# Patient Record
Sex: Female | Born: 1955 | Race: Black or African American | Hispanic: No | Marital: Single | Smoking: Former smoker
Health system: Southern US, Community
[De-identification: ages and names within clinical notes are randomized; demographics above are authoritative.]

## PROBLEM LIST (undated history)

## (undated) ENCOUNTER — Emergency Department (HOSPITAL_BASED_OUTPATIENT_CLINIC_OR_DEPARTMENT_OTHER): Admission: EM | Payer: Medicare Other

## (undated) DIAGNOSIS — R0600 Dyspnea, unspecified: Secondary | ICD-10-CM

## (undated) DIAGNOSIS — C50919 Malignant neoplasm of unspecified site of unspecified female breast: Secondary | ICD-10-CM

## (undated) DIAGNOSIS — Z923 Personal history of irradiation: Secondary | ICD-10-CM

## (undated) DIAGNOSIS — I10 Essential (primary) hypertension: Secondary | ICD-10-CM

## (undated) DIAGNOSIS — K5900 Constipation, unspecified: Secondary | ICD-10-CM

## (undated) HISTORY — DX: Constipation, unspecified: K59.00

---
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
## 1995-05-25 HISTORY — PX: HAND SURGERY: SHX662

## 1999-08-31 ENCOUNTER — Encounter (HOSPITAL_BASED_OUTPATIENT_CLINIC_OR_DEPARTMENT_OTHER): Payer: Self-pay | Admitting: General Surgery

## 1999-09-02 ENCOUNTER — Ambulatory Visit (HOSPITAL_COMMUNITY): Admission: RE | Admit: 1999-09-02 | Discharge: 1999-09-02 | Payer: Self-pay | Admitting: General Surgery

## 2002-02-06 ENCOUNTER — Ambulatory Visit (HOSPITAL_COMMUNITY): Admission: RE | Admit: 2002-02-06 | Discharge: 2002-02-06 | Payer: Self-pay | Admitting: Obstetrics and Gynecology

## 2002-02-08 ENCOUNTER — Encounter: Admission: RE | Admit: 2002-02-08 | Discharge: 2002-02-08 | Payer: Self-pay | Admitting: Family Medicine

## 2002-02-08 ENCOUNTER — Encounter: Payer: Self-pay | Admitting: Family Medicine

## 2004-03-13 ENCOUNTER — Emergency Department (HOSPITAL_COMMUNITY): Admission: EM | Admit: 2004-03-13 | Discharge: 2004-03-13 | Payer: Self-pay | Admitting: Emergency Medicine

## 2004-09-30 ENCOUNTER — Encounter: Admission: RE | Admit: 2004-09-30 | Discharge: 2004-09-30 | Payer: Self-pay | Admitting: Internal Medicine

## 2006-07-29 ENCOUNTER — Other Ambulatory Visit: Admission: RE | Admit: 2006-07-29 | Discharge: 2006-07-29 | Payer: Self-pay | Admitting: Obstetrics and Gynecology

## 2006-08-09 ENCOUNTER — Other Ambulatory Visit: Admission: RE | Admit: 2006-08-09 | Discharge: 2006-08-09 | Payer: Self-pay | Admitting: Obstetrics and Gynecology

## 2006-08-11 ENCOUNTER — Ambulatory Visit (HOSPITAL_COMMUNITY): Admission: RE | Admit: 2006-08-11 | Discharge: 2006-08-11 | Payer: Self-pay | Admitting: Obstetrics and Gynecology

## 2007-12-24 ENCOUNTER — Emergency Department (HOSPITAL_COMMUNITY): Admission: EM | Admit: 2007-12-24 | Discharge: 2007-12-24 | Payer: Self-pay | Admitting: Emergency Medicine

## 2010-06-14 ENCOUNTER — Encounter: Payer: Self-pay | Admitting: Internal Medicine

## 2010-10-09 NOTE — Op Note (Signed)
[facility]  Patient:    Caroline Greer, Caroline Greer                      MRN: [id number] Proc. Date: 09/02/99 Adm. Date:  [id number] Disc. Date: [id number] Attending:  Sonda Primes CC:         Mardene Celeste. Lurene Shadow, M.D. x 2                           Operative Report  PREOPERATIVE DIAGNOSIS:  Lipoma right subscapular region of the back.  POSTOPERATIVE DIAGNOSIS:  Lipoma right subscapular region of the back.  PROCEDURE:  Excision lipoma of back.  SURGEON:  Mardene Celeste. Lurene Shadow, M.D.  ASSISTANT:  Nurse.  ANESTHESIA:  General anesthesia.  INDICATIONS:  The patient is a 55 year old woman presenting with an enlarging mass located inferior to the right scapula.  She presents and desires excision because of her fear of cancer despite assurances otherwise.  She is brought to the operating room for excision.  DESCRIPTION OF PROCEDURE:  Following the induction of anesthesia with the patient positioned in the prone position and the back was prepped and draped to be included in a sterile operative field, I made an elliptical incision over the mass, deepened this through the skin and subcutaneous tissue, carrying the dissection down to he capsule of the lipoma.  The capsule was entered and the lipoma was dissected free from within the capsule and removed and forwarded for pathologic evaluation. The capsule itself was excised.  Hemostasis assured with electrocautery.  Sponge, needle, and instrument counts were verified.  Subcutaneous tissues were closed ith interrupted 3-0 Vicryl sutures.  The skin closed with a running 4-0 Monocryl and reinforced with Steri-Strips.  A sterile compressive dressing applied. Anesthetic was reversed.  The patient was removed from the operating room to the recovery oom in stable condition having tolerated the procedure well. DD:  09/02/99 TD:  09/02/99 Job: [id number]

## 2011-02-19 LAB — CBC
HCT: 46.2 — ABNORMAL HIGH
Hemoglobin: 15.8 — ABNORMAL HIGH
MCHC: 34.1
MCV: 96.9
Platelets: 257
RBC: 4.77
RDW: 13.3
WBC: 12.6 — ABNORMAL HIGH

## 2011-02-19 LAB — URINALYSIS, ROUTINE W REFLEX MICROSCOPIC
Bilirubin Urine: NEGATIVE
Glucose, UA: NEGATIVE
Hgb urine dipstick: NEGATIVE
Ketones, ur: NEGATIVE
Nitrite: NEGATIVE
Protein, ur: NEGATIVE
Specific Gravity, Urine: 1.008
Urobilinogen, UA: 0.2
pH: 6

## 2011-02-19 LAB — HEPATIC FUNCTION PANEL
ALT: 22
AST: 30
Albumin: 4.4
Alkaline Phosphatase: 62
Bilirubin, Direct: 0.3
Indirect Bilirubin: 0.8
Total Bilirubin: 1.1
Total Protein: 7.9

## 2011-02-19 LAB — LIPASE, BLOOD: Lipase: 37

## 2011-02-19 LAB — DIFFERENTIAL
Basophils Absolute: 0
Basophils Relative: 0
Eosinophils Absolute: 0.6
Eosinophils Relative: 5
Lymphocytes Relative: 35
Lymphs Abs: 4.4 — ABNORMAL HIGH
Monocytes Absolute: 0.7
Monocytes Relative: 6
Neutro Abs: 6.9
Neutrophils Relative %: 55

## 2011-02-19 LAB — POCT I-STAT, CHEM 8
BUN: 17
Calcium, Ion: 1.11 — ABNORMAL LOW
Chloride: 106
Creatinine, Ser: 1
Glucose, Bld: 84
HCT: 48 — ABNORMAL HIGH
Hemoglobin: 16.3 — ABNORMAL HIGH
Potassium: 4.1
Sodium: 138
TCO2: 27

## 2011-02-19 LAB — URINE MICROSCOPIC-ADD ON

## 2014-03-14 ENCOUNTER — Ambulatory Visit: Payer: 59 | Admitting: Obstetrics & Gynecology

## 2014-05-20 ENCOUNTER — Encounter: Payer: Self-pay | Admitting: *Deleted

## 2014-05-21 ENCOUNTER — Encounter: Payer: Self-pay | Admitting: Obstetrics & Gynecology

## 2014-06-19 ENCOUNTER — Ambulatory Visit: Payer: 59 | Admitting: Obstetrics & Gynecology

## 2018-09-30 ENCOUNTER — Other Ambulatory Visit: Payer: Self-pay

## 2018-09-30 ENCOUNTER — Encounter (HOSPITAL_COMMUNITY): Payer: Self-pay | Admitting: Emergency Medicine

## 2018-09-30 ENCOUNTER — Observation Stay (HOSPITAL_COMMUNITY)
Admission: EM | Admit: 2018-09-30 | Discharge: 2018-10-01 | Disposition: A | Discharge status: Home / Self Care | Payer: Medicare Other | Attending: Family Medicine | Admitting: Family Medicine

## 2018-09-30 ENCOUNTER — Emergency Department (HOSPITAL_COMMUNITY): Payer: Medicare Other

## 2018-09-30 DIAGNOSIS — J449 Chronic obstructive pulmonary disease, unspecified: Secondary | ICD-10-CM | POA: Diagnosis not present

## 2018-09-30 DIAGNOSIS — Z1159 Encounter for screening for other viral diseases: Secondary | ICD-10-CM | POA: Diagnosis not present

## 2018-09-30 DIAGNOSIS — Z79899 Other long term (current) drug therapy: Secondary | ICD-10-CM | POA: Diagnosis not present

## 2018-09-30 DIAGNOSIS — Z7982 Long term (current) use of aspirin: Secondary | ICD-10-CM | POA: Diagnosis not present

## 2018-09-30 DIAGNOSIS — F418 Other specified anxiety disorders: Secondary | ICD-10-CM | POA: Diagnosis present

## 2018-09-30 DIAGNOSIS — F17210 Nicotine dependence, cigarettes, uncomplicated: Secondary | ICD-10-CM

## 2018-09-30 DIAGNOSIS — J9601 Acute respiratory failure with hypoxia: Secondary | ICD-10-CM

## 2018-09-30 DIAGNOSIS — J9611 Chronic respiratory failure with hypoxia: Secondary | ICD-10-CM | POA: Diagnosis present

## 2018-09-30 DIAGNOSIS — I10 Essential (primary) hypertension: Secondary | ICD-10-CM | POA: Diagnosis not present

## 2018-09-30 DIAGNOSIS — I160 Hypertensive urgency: Secondary | ICD-10-CM | POA: Diagnosis present

## 2018-09-30 DIAGNOSIS — Z66 Do not resuscitate: Secondary | ICD-10-CM | POA: Diagnosis not present

## 2018-09-30 HISTORY — DX: Acute respiratory failure with hypoxia: J96.01

## 2018-09-30 HISTORY — DX: Other specified anxiety disorders: F41.8

## 2018-09-30 HISTORY — DX: Nicotine dependence, cigarettes, uncomplicated: F17.210

## 2018-09-30 HISTORY — DX: Essential (primary) hypertension: I10

## 2018-09-30 LAB — COMPREHENSIVE METABOLIC PANEL
ALT: 18 U/L (ref 0–44)
AST: 22 U/L (ref 15–41)
Albumin: 3.7 g/dL (ref 3.5–5.0)
Alkaline Phosphatase: 72 U/L (ref 38–126)
Anion gap: 16 — ABNORMAL HIGH (ref 5–15)
BUN: 7 mg/dL — ABNORMAL LOW (ref 8–23)
CO2: 27 mmol/L (ref 22–32)
Calcium: 9.2 mg/dL (ref 8.9–10.3)
Chloride: 97 mmol/L — ABNORMAL LOW (ref 98–111)
Creatinine, Ser: 0.74 mg/dL (ref 0.44–1.00)
GFR calc Af Amer: >60 mL/min (ref >60)
GFR calc non Af Amer: >60 mL/min (ref >60)
Glucose, Bld: 119 mg/dL — ABNORMAL HIGH (ref 70–99)
Potassium: 3.6 mmol/L (ref 3.5–5.1)
Sodium: 140 mmol/L (ref 135–145)
Total Bilirubin: 0.6 mg/dL (ref 0.3–1.2)
Total Protein: 7.6 g/dL (ref 6.5–8.1)

## 2018-09-30 LAB — CBC WITH DIFFERENTIAL/PLATELET
Abs Immature Granulocytes: 0.03 10*3/uL (ref 0.00–0.07)
Basophils Absolute: 0 10*3/uL (ref 0.0–0.1)
Basophils Relative: 0 %
Eosinophils Absolute: 0.3 10*3/uL (ref 0.0–0.5)
Eosinophils Relative: 4 %
HCT: 51.5 % — ABNORMAL HIGH (ref 36.0–46.0)
Hemoglobin: 16.9 g/dL — ABNORMAL HIGH (ref 12.0–15.0)
Immature Granulocytes: 0 %
Lymphocytes Relative: 24 %
Lymphs Abs: 2.3 10*3/uL (ref 0.7–4.0)
MCH: 31.5 pg (ref 26.0–34.0)
MCHC: 32.8 g/dL (ref 30.0–36.0)
MCV: 95.9 fL (ref 80.0–100.0)
Monocytes Absolute: 0.7 10*3/uL (ref 0.1–1.0)
Monocytes Relative: 7 %
Neutro Abs: 6.1 10*3/uL (ref 1.7–7.7)
Neutrophils Relative %: 65 %
Platelets: 274 10*3/uL (ref 150–400)
RBC: 5.37 MIL/uL — ABNORMAL HIGH (ref 3.87–5.11)
RDW: 13.8 % (ref 11.5–15.5)
WBC: 9.5 10*3/uL (ref 4.0–10.5)
nRBC: 0 % (ref 0.0–0.2)

## 2018-09-30 LAB — TROPONIN I: Troponin I: <0.03 ng/mL (ref <0.03)

## 2018-09-30 LAB — BRAIN NATRIURETIC PEPTIDE: B Natriuretic Peptide: 163.6 pg/mL — ABNORMAL HIGH (ref 0.0–100.0)

## 2018-09-30 LAB — SARS CORONAVIRUS 2 BY RT PCR (HOSPITAL ORDER, PERFORMED IN ~~LOC~~ HOSPITAL LAB): SARS Coronavirus 2: NEGATIVE

## 2018-09-30 MED ORDER — ALBUTEROL SULFATE (2.5 MG/3ML) 0.083% IN NEBU: 2.5000 mg | INHALATION_SOLUTION | RESPIRATORY_TRACT | Status: DC | PRN

## 2018-09-30 MED ORDER — BUPROPION HCL ER (SR) 150 MG PO TB12
150.0000 mg | ORAL_TABLET | Freq: Every day | ORAL | Status: DC
  Administered 2018-09-30 – 2018-10-01 (×2): 150 mg via ORAL
  Filled 2018-09-30 (×2): qty 1

## 2018-09-30 MED ORDER — ENOXAPARIN SODIUM 40 MG/0.4ML ~~LOC~~ SOLN: 40.0000 mg | SUBCUTANEOUS | Status: DC

## 2018-09-30 MED ORDER — IOHEXOL 300 MG/ML  SOLN
100.0000 mL | Freq: Once | INTRAMUSCULAR | Status: AC | PRN
  Administered 2018-09-30: 100 mL via INTRAVENOUS

## 2018-09-30 MED ORDER — METHYLPREDNISOLONE SODIUM SUCC 125 MG IJ SOLR
60.0000 mg | Freq: Two times a day (BID) | INTRAMUSCULAR | Status: AC
  Administered 2018-09-30 – 2018-10-01 (×2): 60 mg via INTRAVENOUS
  Filled 2018-09-30 (×2): qty 2

## 2018-09-30 MED ORDER — ASPIRIN 81 MG PO CHEW
324.0000 mg | CHEWABLE_TABLET | Freq: Once | ORAL | Status: AC
  Administered 2018-09-30: 324 mg via ORAL
  Filled 2018-09-30: qty 4

## 2018-09-30 MED ORDER — NITROGLYCERIN 0.4 MG SL SUBL
0.4000 mg | SUBLINGUAL_TABLET | SUBLINGUAL | Status: DC | PRN
  Administered 2018-09-30 (×3): 0.4 mg via SUBLINGUAL
  Filled 2018-09-30: qty 1

## 2018-09-30 MED ORDER — IPRATROPIUM-ALBUTEROL 0.5-2.5 (3) MG/3ML IN SOLN
3.0000 mL | Freq: Three times a day (TID) | RESPIRATORY_TRACT | Status: DC
  Administered 2018-10-01: 3 mL via RESPIRATORY_TRACT
  Filled 2018-09-30 (×2): qty 3

## 2018-09-30 MED ORDER — PREDNISONE 20 MG PO TABS: 40.0000 mg | ORAL_TABLET | Freq: Every day | ORAL | Status: DC

## 2018-09-30 MED ORDER — NICOTINE 14 MG/24HR TD PT24
14.0000 mg | MEDICATED_PATCH | Freq: Every day | TRANSDERMAL | Status: DC
  Administered 2018-09-30 – 2018-10-01 (×2): 14 mg via TRANSDERMAL
  Filled 2018-09-30 (×2): qty 1

## 2018-09-30 MED ORDER — ASPIRIN EC 81 MG PO TBEC
81.0000 mg | DELAYED_RELEASE_TABLET | Freq: Every day | ORAL | Status: DC
  Administered 2018-10-01: 81 mg via ORAL
  Filled 2018-09-30: qty 1

## 2018-09-30 MED ORDER — BUPROPION HCL ER (SR) 150 MG PO TB12: 150.0000 mg | ORAL_TABLET | Freq: Two times a day (BID) | ORAL | Status: DC

## 2018-09-30 MED ORDER — IPRATROPIUM-ALBUTEROL 0.5-2.5 (3) MG/3ML IN SOLN
3.0000 mL | Freq: Four times a day (QID) | RESPIRATORY_TRACT | Status: DC
  Administered 2018-09-30 (×2): 3 mL via RESPIRATORY_TRACT
  Filled 2018-09-30 (×2): qty 3

## 2018-09-30 MED ORDER — HYDRALAZINE HCL 20 MG/ML IJ SOLN
5.0000 mg | INTRAMUSCULAR | Status: DC | PRN
  Administered 2018-09-30: 5 mg via INTRAVENOUS
  Filled 2018-09-30: qty 1

## 2018-09-30 MED ORDER — BENAZEPRIL HCL 40 MG PO TABS
40.0000 mg | ORAL_TABLET | Freq: Every day | ORAL | Status: DC
  Administered 2018-10-01: 40 mg via ORAL
  Filled 2018-09-30: qty 1

## 2018-09-30 MED ORDER — LABETALOL HCL 5 MG/ML IV SOLN
10.0000 mg | Freq: Once | INTRAVENOUS | Status: AC
  Administered 2018-09-30: 10 mg via INTRAVENOUS
  Filled 2018-09-30: qty 4

## 2018-09-30 MED ORDER — LORATADINE 10 MG PO TABS
10.0000 mg | ORAL_TABLET | Freq: Every day | ORAL | Status: DC
  Administered 2018-10-01: 10 mg via ORAL
  Filled 2018-09-30: qty 1

## 2018-09-30 NOTE — ED Triage Notes (Signed)
Pt in with sob and cough x 1 wk. Sats 83% on RA up front, placed on 3LNC. Having mild cp, denies any fevers per family. Temp 54F oral, skin flushed and pt feels generalized weakness.

## 2018-09-30 NOTE — H&P (Signed)
History and Physical    Caroline Greer YWV:[id number] DOB: 12-09-1955 DOA: 09/30/2018  PCP: [name], MD Consultants:  None Patient coming from:  Home - lives with daughter for now; NOK: Daughter, Caroline Greer, [phone]  Chief Complaint: SOB  HPI: Caroline Greer is a 63 y.o. female with medical history significant of HTN presenting with SOB.  Yesterday, she was putting strawberries away and she got very SOB.  She had to hold onto the table to stand up.  She fanned herself for a while and started feeling better.  This AM, she wasn't breathing any better.  She cut the grass and sawed down some trees with handsaw and after she was sneezing, coughing, and her eyes were running and she thought it was allergies.  But the breathing got worse and worse.  Her daughter got a pulse ox (works as a Charity fundraiser at [employer]) and it wouldn't go above 75%.  SOB has been going on for about 2-3 weeks; it is present with ambulation but has been getting worse.  +cough, chronic over time for several years (thinks related to exposures), occasionally productive of phlegm.  No fevers.  Cough is slightly worse now.  No edema.  She can't gain weight.   ED Course:   1 month of progressive SOB, first exertional and now at rest.  No COVID - CXR negative, not clinically.  Likely has terrible undiagnosed COPD with need for home O2.  CTA is pending.  She will need admission regardless of result.  Central CP that started yesterday, not exertional.  No cough, fever, orthopnea.  Sats in low 80s on RA.  BP 220/120.  Concern for HTN emergency, pulm edema - but CXR without fluid, BNP not elevated.  Given NTG with slight BP improvement, now giving Labetalol.  Review of Systems: As per HPI; otherwise review of systems reviewed and negative.   Ambulatory Status:  Ambulates without assistance  Past Medical History:  Diagnosis Date  . Hypertension     Past Surgical History:  Procedure Laterality Date  . LAPAROTOMY  1974    Social  History   Socioeconomic History  . Marital status: Single    Spouse name: Not on file  . Number of children: Not on file  . Years of education: Not on file  . Highest education level: Not on file  Occupational History  . Not on file  Social Needs  . Financial resource strain: Not on file  . Food insecurity:    Worry: Not on file    Inability: Not on file  . Transportation needs:    Medical: Not on file    Non-medical: Not on file  Tobacco Use  . Smoking status: Current Every Day Smoker    Packs/day: 0.67    Years: 43.00    Pack years: 28.81    Types: Cigarettes  . Smokeless tobacco: Never Used  Substance and Sexual Activity  . Alcohol use: Yes    Comment: gets 6 beers on a weekend and drinks them  . Drug use: Not Currently    Types: Marijuana    Comment: tried to smoke some about a month ago  . Sexual activity: Not on file  Lifestyle  . Physical activity:    Days per week: Not on file    Minutes per session: Not on file  . Stress: Not on file  Relationships  . Social connections:    Talks on phone: Not on file    Gets together: Not on file  Attends religious service: Not on file    Active member of club or organization: Not on file    Attends meetings of clubs or organizations: Not on file    Relationship status: Not on file  . Intimate partner violence:    Fear of current or ex partner: Not on file    Emotionally abused: Not on file    Physically abused: Not on file    Forced sexual activity: Not on file  Other Topics Concern  . Not on file  Social History Narrative  . Not on file    Allergies  Allergen Reactions  . Erythromycin Other (See Comments)    convulsion    History reviewed. No pertinent family history.  Prior to Admission medications   Medication Sig Start Date End Date Taking? Authorizing Provider  aspirin EC 81 MG tablet Take 81 mg by mouth daily.   Yes [provider]  benazepril (LOTENSIN) 40 MG tablet Take 40 mg by mouth  daily. 07/06/18  Yes [provider]  cetirizine (ZYRTEC) 10 MG tablet Take 10 mg by mouth daily.   Yes [provider]  Flaxseed, Linseed, (FLAX SEED OIL) 1000 MG CAPS Take 1,000 mg by mouth daily.   Yes [provider]  vitamin B-12 (CYANOCOBALAMIN) 1000 MCG tablet Take 1,000 mcg by mouth daily.   Yes [provider]    Physical Exam: Vitals:   09/30/18 1400 09/30/18 1500 09/30/18 1556 09/30/18 1621  BP: (!) 184/114 (!) 182/98 (!) 197/95 (!) 199/101  Pulse: 69 69 66   Resp:      Temp:   99.8 F (37.7 C)   TempSrc:   Oral   SpO2: 96% 98% 92%   Weight:   61.5 kg   Height:         . General:  Appears calm and comfortable and is NAD, on [place] O2 and conversant without apparent dyspnea . Eyes:  PERRL, EOMI, normal lids, iris . ENT:  grossly normal hearing, lips & tongue, mmm . Neck:  no LAD, masses or thyromegaly . Cardiovascular:  RRR, no m/r/g. No LE edema.  Marland Kitchen Respiratory:   Barrel chested. CTA bilaterally with no wheezes/rales/rhonchi.  Normal respiratory effort. . Abdomen:  soft, NT, ND, NABS . Back:   normal alignment, no CVAT . Skin:  no rash or induration seen on limited exam . Musculoskeletal:  grossly normal tone BUE/BLE, good ROM, no bony abnormality . Psychiatric:  grossly normal mood and affect, speech fluent and appropriate, AOx3; emotionally labile at times when discussing her mother's death . Neurologic:  CN 2-12 grossly intact, moves all extremities in coordinated fashion, sensation intact    Radiological Exams on Admission: Ct Angio Chest Pe W And/or Wo Contrast  Result Date: 09/30/2018 CLINICAL DATA:  Shortness of breath, cough. EXAM: CT ANGIOGRAPHY CHEST WITH CONTRAST TECHNIQUE: Multidetector CT imaging of the chest was performed using the standard protocol during bolus administration of intravenous contrast. Multiplanar CT image reconstructions and MIPs were obtained to evaluate the vascular anatomy. CONTRAST:  123mL OMNIPAQUE  IOHEXOL 300 MG/ML  SOLN COMPARISON:  Radiograph of same day. FINDINGS: Cardiovascular: Satisfactory opacification of the pulmonary arteries to the segmental level. No evidence of pulmonary embolism. Normal heart size. No pericardial effusion. Coronary artery calcifications are noted. Mediastinum/Nodes: No enlarged mediastinal, hilar, or axillary lymph nodes. Thyroid gland, trachea, and esophagus demonstrate no significant findings. Lungs/Pleura: No pneumothorax or pleural effusion is noted. 6 mm nodule is noted in right upper lobe best seen  on image number 21 of series 7. 6 mm subpleural nodule is noted in left upper lobe best seen on image number 16 of series 7. 4 mm nodule is noted in left lower lobe best seen on image number 49 of series 7. Upper Abdomen: No acute abnormality. Musculoskeletal: No chest wall abnormality. No acute or significant osseous findings. Review of the MIP images confirms the above findings. IMPRESSION: No definite evidence of pulmonary embolus. Multiple pulmonary nodules are noted bilaterally, with the largest measuring 6 mm in right upper lobe. Non-contrast chest CT at 3-6 months is recommended. If the nodules are stable at time of repeat CT, then future CT at 18-24 months (from today's scan) is considered optional for low-risk patients, but is recommended for high-risk patients. This recommendation follows the consensus statement: Guidelines for Management of Incidental Pulmonary Nodules Detected on CT Images: From the Fleischner Society 2017; Radiology 2017; 284:228-243. Coronary artery calcifications are noted. Aortic Atherosclerosis (ICD10-I70.0). Electronically Signed   By: Marijo Conception M.D.   On: 09/30/2018 13:41   Dg Chest Portable 1 View  Result Date: 09/30/2018 CLINICAL DATA:  Shortness of breath for 1 month and acute chest pain today. EXAM: PORTABLE CHEST 1 VIEW COMPARISON:  09/30/2004 chest radiograph FINDINGS: UPPER limits normal heart size noted. Mild hyperinflation  again identified. There is no evidence of focal airspace disease, pulmonary edema, suspicious pulmonary nodule/mass, pleural effusion, or pneumothorax. No acute bony abnormalities are identified. IMPRESSION: No evidence of acute cardiopulmonary disease. Electronically Signed   By: Margarette Canada M.D.   On: 09/30/2018 12:11    EKG: Independently reviewed.  NSR with rate 84; nonspecific ST changes with no evidence of acute ischemia   Labs on Admission: I have personally reviewed the available labs and imaging studies at the time of the admission.  Pertinent labs:   Unremarkable CMP BNP 163.6 Troponin <0.03 Unremarkable CBC COVID negative  Assessment/Plan Principal Problem:   Acute respiratory failure with hypoxia (HCC) Active Problems:   Essential hypertension   Tobacco dependence due to cigarettes   Depression with anxiety   Acute respiratory failure with hypoxia -Patient without prior h/o respiratory issues but long-standing smoking history presenting with progressive DOE and cough -CXR negative -CTA unremarkable other than pulmonary nodules which will need CT surveillance -With O2, she has improved considerably and feels much better, requesting to go home -Suspect that patient has COPD and likely needs home O2 -Will observe overnight -COPD order set utilized -Will add steroids (IV -> PO) -She does not appear to require antibiotics at this time -Will do walk of like tomorrow to determine if she needs home O2 -Standing Duonebs, prn Albuterol nebs -She is COVID negative  HTN -Continue home Lotensin -Add prn IV hydralazine -She may need an additional home medication, as her BP has ben wildly uncontrolled while in the ER  Depression with anxiety -She reports intermittent anxiety for which she takes occasional medication ([place] reports Xanax #30 in 11/19, which would indicate very intermittent use -She does report significant sadness regarding the loss of her mother -I offered  to start Wellbutrin, which may help with smoking cessation as well as mood issues; I also offered to defer this to her PCP -She requested to start this medication -Will start 150 mg Wellbutrin SR for daily x 3 days and then increase to BID -Assuming no issues, I would suggest a 1-2 week supply at the time of hospital d/c -She does not have a known seizure history  Tobacco dependence -  Encourage cessation.   -This was discussed with the patient and should be reviewed on an ongoing basis.   -Patch ordered at patient request. -Wellbutrin also ordered at patient request, as above.    Note: This patient has been tested and is negative for the novel coronavirus COVID-19.  DVT prophylaxis:  Lovenox  Code Status: DNR - confirmed with patient Family Communication: None present; she did not request that I speak with her daughter Disposition Plan:  Home once clinically improved Consults called: CM for possible DME, home O2  Admission status: It is my clinical opinion that referral for OBSERVATION is reasonable and necessary in this patient based on the above information provided. The aforementioned taken together are felt to place the patient at high risk for further clinical deterioration. However it is anticipated that the patient may be medically stable for discharge from the hospital within 24 to 48 hours.    Karmen Bongo MD [facility]   How to contact the [facility] Attending or Consulting provider [place] or covering provider during after hours [place], for this patient?  1. Check the care team in [facility] and look for a) attending/consulting [facility] provider listed and b) the [facility] team listed 2. Log into [email or web address] and use [place]'s universal password to access. If you do not have the password, please contact the hospital operator. 3. Locate the [facility] provider you are looking for under [facility] and page to a number that you can be directly reached. 4. If you still have difficulty  reaching the provider, please page the [facility] (Director on Call) for the Hospitalists listed on amion for assistance.   09/30/2018, 4:30 PM

## 2018-09-30 NOTE — ED Notes (Signed)
Got patient undress on the monitor.did ekg shown to Dr Schlossman patient is resting with call bell in reach 

## 2018-09-30 NOTE — ED Provider Notes (Signed)
[facility] EMERGENCY DEPARTMENT Provider Note   CSN: [id number] Arrival date & time: 09/30/18  1045    History   Chief Complaint Chief Complaint  Patient presents with  . Shortness of Breath  . Cough    HPI Caroline Greer is a 63 y.o. female.     HPI   63 year old female with a history of hypertension and smoking who presents with 1 month of progressive shortness of breath.  Reports that over the last month she is had increasing dyspnea on exertion, initially feeling short of breath with walking from the mailbox and back, and now will feel short of breath at times at rest.  Denies any orthopnea, leg swelling.  Reports she is also had chronic cough that has been going on for a long period of time, not significantly worse recently.  Denies fevers, sick contacts, specifically no contacts with known COVID-19.  She does work as a Quarry manager.  Reports for the last 2 days she is also had chest pain in the center of her chest without radiation.  Nothing makes it better or worse.  She did take her antihypertensives today but notes they tend to be high. She jokes that she is upset at her daughter for making her come to the hospital.  Past Medical History:  Diagnosis Date  . Hypertension     There are no active problems to display for this patient.   History reviewed. No pertinent surgical history.   OB History   No obstetric history on file.      Home Medications    Prior to Admission medications   Medication Sig Start Date End Date Taking? Authorizing Provider  aspirin EC 81 MG tablet Take 81 mg by mouth daily.   Yes [provider]  benazepril (LOTENSIN) 40 MG tablet Take 40 mg by mouth daily. 07/06/18  Yes [provider]  cetirizine (ZYRTEC) 10 MG tablet Take 10 mg by mouth daily.   Yes [provider]  Flaxseed, Linseed, (FLAX SEED OIL) 1000 MG CAPS Take 1,000 mg by mouth daily.   Yes [provider]  vitamin B-12  (CYANOCOBALAMIN) 1000 MCG tablet Take 1,000 mcg by mouth daily.   Yes [provider]    Family History History reviewed. No pertinent family history.  Social History Social History   Tobacco Use  . Smoking status: Current Every Day Smoker  . Smokeless tobacco: Never Used  Substance Use Topics  . Alcohol use: Not on file  . Drug use: Not on file     Allergies   Erythromycin   Review of Systems Review of Systems  Constitutional: Positive for fatigue. Negative for fever.  HENT: Negative for sore throat.   Eyes: Negative for visual disturbance.  Respiratory: Positive for cough and shortness of breath.   Cardiovascular: Positive for chest pain. Negative for leg swelling.  Gastrointestinal: Negative for abdominal pain, constipation, diarrhea, nausea and vomiting.  Genitourinary: Negative for difficulty urinating and dysuria.  Musculoskeletal: Negative for back pain and neck pain.  Skin: Negative for rash.  Neurological: Negative for syncope and headaches.     Physical Exam Updated Vital Signs BP (!) 177/106   Pulse 82   Temp 98.5 F (36.9 C)   Resp 16   Ht 5' 10.75" (1.797 m)   Wt 59.2 kg Comment: Simultaneous filing. User may not have seen previous data.  SpO2 92%   BMI 18.34 kg/m   Physical Exam Vitals signs and nursing note reviewed.  Constitutional:      General: She is not in acute distress.    Appearance: She is well-developed and underweight. She is not diaphoretic.  HENT:     Head: Normocephalic and atraumatic.  Eyes:     Conjunctiva/sclera: Conjunctivae normal.  Neck:     Musculoskeletal: Normal range of motion.     Vascular: JVD present.  Cardiovascular:     Rate and Rhythm: Normal rate and regular rhythm.     Heart sounds: Normal heart sounds. No murmur. No friction rub. No gallop.   Pulmonary:     Effort: Pulmonary effort is normal. Tachypnea present. No respiratory distress.     Breath sounds: Decreased breath sounds present. No  wheezing or rales.  Abdominal:     General: There is no distension.     Palpations: Abdomen is soft.     Tenderness: There is no abdominal tenderness. There is no guarding.  Musculoskeletal:        General: No tenderness.     Right lower leg: Edema (trace) present.     Left lower leg: Edema (trace) present.  Skin:    General: Skin is warm and dry.     Findings: No erythema or rash.  Neurological:     Mental Status: She is alert and oriented to person, place, and time.      ED Treatments / Results  Labs (all labs ordered are listed, but only abnormal results are displayed) Labs Reviewed  CBC WITH DIFFERENTIAL/PLATELET - Abnormal; Notable for the following components:      Result Value   RBC 5.37 (*)    Hemoglobin 16.9 (*)    HCT 51.5 (*)    All other components within normal limits  COMPREHENSIVE METABOLIC PANEL - Abnormal; Notable for the following components:   Chloride 97 (*)    Glucose, Bld 119 (*)    BUN 7 (*)    Anion gap 16 (*)    All other components within normal limits  BRAIN NATRIURETIC PEPTIDE - Abnormal; Notable for the following components:   B Natriuretic Peptide 163.6 (*)    All other components within normal limits  SARS CORONAVIRUS 2 (HOSPITAL ORDER, [place] [facility])  TROPONIN I    EKG EKG Interpretation  Date/Time:  Saturday Sep 30 2018 10:58:46 EDT Ventricular Rate:  84 PR Interval:    QRS Duration: 95 QT Interval:  405 QTC Calculation: 479 R Axis:   101 Text Interpretation:  Sinus rhythm Ventricular premature complex Probable right ventricular hypertrophy Borderline T abnormalities, anterior leads Baseline wander in lead(s) V3 Nonspecific ST changes anteriorly new from prior Confirmed by Gareth Morgan [phone]) on 09/30/2018 11:20:47 AM   Radiology Dg Chest Portable 1 View  Result Date: 09/30/2018 CLINICAL DATA:  Shortness of breath for 1 month and acute chest pain today. EXAM: PORTABLE CHEST 1 VIEW COMPARISON:   09/30/2004 chest radiograph FINDINGS: UPPER limits normal heart size noted. Mild hyperinflation again identified. There is no evidence of focal airspace disease, pulmonary edema, suspicious pulmonary nodule/mass, pleural effusion, or pneumothorax. No acute bony abnormalities are identified. IMPRESSION: No evidence of acute cardiopulmonary disease. Electronically Signed   By: Margarette Canada M.D.   On: 09/30/2018 12:11    Procedures Procedures (including critical care time)  Medications Ordered in ED Medications  nitroGLYCERIN (NITROSTAT) SL tablet 0.4 mg (0.4 mg Sublingual Given 09/30/18 1301)  labetalol (NORMODYNE) injection 10 mg (has no administration in time range)  aspirin chewable tablet 324 mg (324 mg  Oral Given 09/30/18 1139)  iohexol (OMNIPAQUE) 300 MG/ML solution 100 mL (100 mLs Intravenous Contrast Given 09/30/18 1326)     Initial Impression / Assessment and Plan / ED Course  I have reviewed the triage vital signs and the nursing notes.  Pertinent labs & imaging results that were available during my care of the patient were reviewed by me and considered in my medical decision making (see chart for details).        63 year old female with a history of hypertension and smoking who presents with 1 month of progressive shortness of breath.  DDx includes CHF, hypertensive emergency/pulmonary edema, COPD, pneumonia, PE, COVID19.  She is hypoxic down to the low 80s on room air with blood pressures 220/115 on arrival. Placed on oxygen.  Mixed picture clinically with JVD, htn, however diminished breath sounds and clinical suspicion on arrival for combined CHF/COPD exacerbation.  No prior diagnosis of COPD but suspect underlying diagnosis given long history of smoking and chronic cough.   XR completed showing COPD without acute abnormalities.  CBC shows mild erythrocytosis likely secondary to lung disease.  BNP 160s. Troponin negative.    Given ASA and nitroglycerin for htn/CP.  Continued htn  and given labetalol IV.  Given degree of hypoxia, ordered CT PE study for eval for PE and other evidence of lung disease not noted on XR.  Will plan on admit for hypertension and hypoxic respiratory failure, which may be due to chronic respiratory failure from undiagnosed COPD or other underlying abnormality we may find on CT.  Final Clinical Impressions(s) / ED Diagnoses   Final diagnoses:  Acute respiratory failure with hypoxia [facility])  Hypertensive urgency    ED Discharge Orders    None       Gareth Morgan, MD 09/30/18 1330

## 2018-09-30 NOTE — ED Notes (Signed)
Attempted report 

## 2018-09-30 NOTE — ED Triage Notes (Signed)
Pt here from home with c/o sob times 1 month , pt has smoked for years but is not on O2 , sats in the 80 "s in triage ,

## 2018-09-30 NOTE — ED Notes (Signed)
ED TO INPATIENT HANDOFF REPORT  ED Nurse Name and Phone #:  [phone]   S Name/Age/Gender Caroline Greer 63 y.o. female Room/Bed: 033C/033C  Code Status   Code Status: DNR  Home/SNF/Other Home Patient oriented to: self, place, time and situation Is this baseline? Yes   Triage Complete: Triage complete(Simultaneous filing. User may not have seen previous data.)  Chief Complaint SOB  Triage Note Pt in with sob and cough x 1 wk. Sats 83% on RA up front, placed on 3LNC. Having mild cp, denies any fevers per family. Temp 66F oral, skin flushed and pt feels generalized weakness.  Pt here from home with c/o sob times 1 month , pt has smoked for years but is not on O2 , sats in the 80 "s in triage ,    Allergies Allergies  Allergen Reactions  . Erythromycin Other (See Comments)    convulsion    Level of Care/Admitting Diagnosis ED Disposition    ED Disposition Condition Comment   Admit  Hospital Area: [facility] [[id number]]  Level of Care: Med-Surg [16]  I expect the patient will be discharged within 24 hours: Yes  LOW acuity---Tx typically complete <24 hrs---ACUTE conditions typically can be evaluated <24 hours---LABS likely to return to acceptable levels <24 hours---IS near functional baseline---EXPECTED to return to current living arrangement---NOT newly hypoxic: Meets criteria for 5C-Observation unit  Covid Evaluation: Screening Protocol (No Symptoms)  Diagnosis: Acute respiratory failure with hypoxia [facility]) [[id number]]  Admitting Physician: Karmen Bongo [[id number]]  Attending Physician: Karmen Bongo [[id number]]  PT Class (Do Not Modify): Observation [104]  PT Acc Code (Do Not Modify): Observation [10022]       B Medical/Surgery History Past Medical History:  Diagnosis Date  . Hypertension    Past Surgical History:  Procedure Laterality Date  . LAPAROTOMY  1974     A IV Location/Drains/Wounds Patient Lines/Drains/Airways Status   Active  Line/Drains/Airways    Name:   Placement date:   Placement time:   Site:   Days:   Peripheral IV 09/30/18 Left Forearm   09/30/18    1150    Forearm   less than 1          Intake/Output Last 24 hours No intake or output data in the 24 hours ending 09/30/18 1507  Labs/Imaging Results for orders placed or performed during the hospital encounter of 09/30/18 (from the past 48 hour(s))  Brain natriuretic peptide     Status: Abnormal   Collection Time: 09/30/18 11:02 AM  Result Value Ref Range   B Natriuretic Peptide 163.6 (H) 0.0 - 100.0 pg/mL    Comment: Performed at [facility] [address], [place], [place] [address]  CBC with Differential     Status: Abnormal   Collection Time: 09/30/18 11:10 AM  Result Value Ref Range   WBC 9.5 4.0 - 10.5 K/uL   RBC 5.37 (H) 3.87 - 5.11 MIL/uL   Hemoglobin 16.9 (H) 12.0 - 15.0 g/dL   HCT 51.5 (H) 36.0 - 46.0 %   MCV 95.9 80.0 - 100.0 fL   MCH 31.5 26.0 - 34.0 pg   MCHC 32.8 30.0 - 36.0 g/dL   RDW 13.8 11.5 - 15.5 %   Platelets 274 150 - 400 K/uL   nRBC 0.0 0.0 - 0.2 %   Neutrophils Relative % 65 %   Neutro Abs 6.1 1.7 - 7.7 K/uL   Lymphocytes Relative 24 %   Lymphs Abs 2.3 0.7 - 4.0  K/uL   Monocytes Relative 7 %   Monocytes Absolute 0.7 0.1 - 1.0 K/uL   Eosinophils Relative 4 %   Eosinophils Absolute 0.3 0.0 - 0.5 K/uL   Basophils Relative 0 %   Basophils Absolute 0.0 0.0 - 0.1 K/uL   Immature Granulocytes 0 %   Abs Immature Granulocytes 0.03 0.00 - 0.07 K/uL    Comment: Performed at [facility], [place] [address] [place], [place] [address]  Comprehensive metabolic panel     Status: Abnormal   Collection Time: 09/30/18 11:10 AM  Result Value Ref Range   Sodium 140 135 - 145 mmol/L   Potassium 3.6 3.5 - 5.1 mmol/L   Chloride 97 (L) 98 - 111 mmol/L   CO2 27 22 - 32 mmol/L   Glucose, Bld 119 (H) 70 - 99 mg/dL   BUN 7 (L) 8 - 23 mg/dL   Creatinine, Ser 0.74 0.44 - 1.00 mg/dL   Calcium 9.2 8.9 - 10.3 mg/dL   Total  Protein 7.6 6.5 - 8.1 g/dL   Albumin 3.7 3.5 - 5.0 g/dL   AST 22 15 - 41 U/L   ALT 18 0 - 44 U/L   Alkaline Phosphatase 72 38 - 126 U/L   Total Bilirubin 0.6 0.3 - 1.2 mg/dL   GFR calc non Af Amer >60 >60 mL/min   GFR calc Af Amer >60 >60 mL/min   Anion gap 16 (H) 5 - 15    Comment: Performed at [facility], [place] [address] [place], [place] [address]  Troponin I - ONCE - STAT     Status: None   Collection Time: 09/30/18 11:10 AM  Result Value Ref Range   Troponin I <0.03 <0.03 ng/mL    Comment: Performed at [facility] [address] [place], [place] [address]  SARS Coronavirus 2 (CEPHEID- Performed in [facility] hospital lab), Hosp Order     Status: None   Collection Time: 09/30/18 11:17 AM  Result Value Ref Range   SARS Coronavirus 2 NEGATIVE NEGATIVE    Comment: (NOTE) If result is NEGATIVE SARS-CoV-2 target nucleic acids are NOT DETECTED. The SARS-CoV-2 RNA is generally detectable in upper and lower  respiratory specimens during the acute phase of infection. The lowest  concentration of SARS-CoV-2 viral copies this assay can detect is 250  copies / mL. A negative result does not preclude SARS-CoV-2 infection  and should not be used as the sole basis for treatment or other  patient management decisions.  A negative result may occur with  improper specimen collection / handling, submission of specimen other  than nasopharyngeal swab, presence of viral mutation(s) within the  areas targeted by this assay, and inadequate number of viral copies  (<250 copies / mL). A negative result must be combined with clinical  observations, patient history, and epidemiological information. If result is POSITIVE SARS-CoV-2 target nucleic acids are DETECTED. The SARS-CoV-2 RNA is generally detectable in upper and lower  respiratory specimens dur ing the acute phase of infection.  Positive  results are indicative of active infection with SARS-CoV-2.  Clinical  correlation with  patient history and other diagnostic information is  necessary to determine patient infection status.  Positive results do  not rule out bacterial infection or co-infection with other viruses. If result is PRESUMPTIVE POSTIVE SARS-CoV-2 nucleic acids MAY BE PRESENT.   A presumptive positive result was obtained on the submitted specimen  and confirmed on repeat testing.  While 2019  novel coronavirus  (SARS-CoV-2) nucleic acids may be present in the submitted sample  additional confirmatory testing may be necessary for epidemiological  and / or clinical management purposes  to differentiate between  SARS-CoV-2 and other Sarbecovirus currently known to infect humans.  If clinically indicated additional testing with an alternate test  methodology [phone]) is advised. The SARS-CoV-2 RNA is generally  detectable in upper and lower respiratory sp ecimens during the acute  phase of infection. The expected result is Negative. Fact Sheet for Patients:  StrictlyIdeas.no Fact Sheet for Healthcare Providers: BankingDealers.co.za This test is not yet approved or cleared by the [place] FDA and has been authorized for detection and/or diagnosis of SARS-CoV-2 by FDA under an Emergency Use Authorization (EUA).  This EUA will remain in effect (meaning this test can be used) for the duration of the COVID-19 declaration under Section 564(b)(1) of the Act, 21 U.S.C. section 360bbb-3(b)(1), unless the authorization is terminated or revoked sooner. Performed at [facility], [place] [address] [place], [place] [address]    Ct Angio Chest Pe W And/or Wo Contrast  Result Date: 09/30/2018 CLINICAL DATA:  Shortness of breath, cough. EXAM: CT ANGIOGRAPHY CHEST WITH CONTRAST TECHNIQUE: Multidetector CT imaging of the chest was performed using the standard protocol during bolus administration of intravenous contrast. Multiplanar CT image reconstructions and  MIPs were obtained to evaluate the vascular anatomy. CONTRAST:  151mL OMNIPAQUE IOHEXOL 300 MG/ML  SOLN COMPARISON:  Radiograph of same day. FINDINGS: Cardiovascular: Satisfactory opacification of the pulmonary arteries to the segmental level. No evidence of pulmonary embolism. Normal heart size. No pericardial effusion. Coronary artery calcifications are noted. Mediastinum/Nodes: No enlarged mediastinal, hilar, or axillary lymph nodes. Thyroid gland, trachea, and esophagus demonstrate no significant findings. Lungs/Pleura: No pneumothorax or pleural effusion is noted. 6 mm nodule is noted in right upper lobe best seen on image number 21 of series 7. 6 mm subpleural nodule is noted in left upper lobe best seen on image number 16 of series 7. 4 mm nodule is noted in left lower lobe best seen on image number 49 of series 7. Upper Abdomen: No acute abnormality. Musculoskeletal: No chest wall abnormality. No acute or significant osseous findings. Review of the MIP images confirms the above findings. IMPRESSION: No definite evidence of pulmonary embolus. Multiple pulmonary nodules are noted bilaterally, with the largest measuring 6 mm in right upper lobe. Non-contrast chest CT at 3-6 months is recommended. If the nodules are stable at time of repeat CT, then future CT at 18-24 months (from today's scan) is considered optional for low-risk patients, but is recommended for high-risk patients. This recommendation follows the consensus statement: Guidelines for Management of Incidental Pulmonary Nodules Detected on CT Images: From the Fleischner Society 2017; Radiology 2017; 284:228-243. Coronary artery calcifications are noted. Aortic Atherosclerosis (ICD10-I70.0). Electronically Signed   By: Marijo Conception M.D.   On: 09/30/2018 13:41   Dg Chest Portable 1 View  Result Date: 09/30/2018 CLINICAL DATA:  Shortness of breath for 1 month and acute chest pain today. EXAM: PORTABLE CHEST 1 VIEW COMPARISON:  09/30/2004 chest  radiograph FINDINGS: UPPER limits normal heart size noted. Mild hyperinflation again identified. There is no evidence of focal airspace disease, pulmonary edema, suspicious pulmonary nodule/mass, pleural effusion, or pneumothorax. No acute bony abnormalities are identified. IMPRESSION: No evidence of acute cardiopulmonary disease. Electronically Signed   By: Margarette Canada M.D.   On: 09/30/2018 12:11    Pending Labs Unresulted Labs (From admission, onward)  Start     Ordered   10/01/18 9371  Basic metabolic panel  Tomorrow morning,   R     09/30/18 1437   10/01/18 0500  CBC  Tomorrow morning,   R     09/30/18 1437   09/30/18 1433  HIV antibody (Routine Testing)  Once,   R     09/30/18 1437          Vitals/Pain Today's Vitals   09/30/18 1245 09/30/18 1300 09/30/18 1358 09/30/18 1400  BP:  (!) 177/106  (!) 184/114  Pulse: 83 82  69  Resp: (!) 23     Temp:      SpO2: 92% 92%  96%  Weight:      Height:      PainSc:   0-No pain     Isolation Precautions No active isolations  Medications Medications  aspirin EC tablet 81 mg (has no administration in time range)  benazepril (LOTENSIN) tablet 40 mg (has no administration in time range)  loratadine (CLARITIN) tablet 10 mg (has no administration in time range)  enoxaparin (LOVENOX) injection 40 mg (has no administration in time range)  methylPREDNISolone sodium succinate (SOLU-MEDROL) 125 mg/2 mL injection 60 mg (has no administration in time range)    Followed by  predniSONE (DELTASONE) tablet 40 mg (has no administration in time range)  ipratropium-albuterol (DUONEB) 0.5-2.5 (3) MG/3ML nebulizer solution 3 mL (has no administration in time range)  albuterol (PROVENTIL) (2.5 MG/3ML) 0.083% nebulizer solution 2.5 mg (has no administration in time range)  buPROPion (WELLBUTRIN SR) 12 hr tablet 150 mg (has no administration in time range)  buPROPion (WELLBUTRIN SR) 12 hr tablet 150 mg (has no administration in time range)  nicotine  (NICODERM CQ - dosed in mg/24 hours) patch 14 mg (has no administration in time range)  aspirin chewable tablet 324 mg (324 mg Oral Given 09/30/18 1139)  labetalol (NORMODYNE) injection 10 mg (10 mg Intravenous Given 09/30/18 1355)  iohexol (OMNIPAQUE) 300 MG/ML solution 100 mL (100 mLs Intravenous Contrast Given 09/30/18 1326)    Mobility walks Low fall risk(Simultaneous filing. User may not have seen previous data.)   Focused Assessments Cardiac Assessment Handoff:  Cardiac Rhythm: Normal sinus rhythm Lab Results  Component Value Date   TROPONINI <0.03 09/30/2018   No results found for: DDIMER Does the Patient currently have chest pain? No  , Pulmonary Assessment Handoff:  Lung sounds: Bilateral Breath Sounds: Diminished L Breath Sounds: Expiratory wheezes O2 Device: Nasal Cannula O2 Flow Rate (L/min): 2 L/min      R Recommendations: See Admitting Provider Note  Report given to:   Additional Notes:

## 2018-09-30 NOTE — Plan of Care (Signed)
  Problem: Education: Ambulating to bathroom wo distress, reviewed plan of care, verbalized understanding . No complaint of pain or distress Goal: Knowledge of General Education information will improve Description Including pain rating scale, medication(s)/side effects and non-pharmacologic comfort measures Outcome: Progressing   Problem: Clinical Measurements: Goal: Respiratory complications will improve Outcome: Progressing   Problem: Activity: Goal: Risk for activity intolerance will decrease Outcome: Progressing   Problem: Coping: Goal: Level of anxiety will decrease Outcome: Progressing

## 2018-10-01 ENCOUNTER — Telehealth: Payer: Self-pay | Admitting: Family Medicine

## 2018-10-01 DIAGNOSIS — J9601 Acute respiratory failure with hypoxia: Secondary | ICD-10-CM | POA: Diagnosis not present

## 2018-10-01 LAB — BASIC METABOLIC PANEL
Anion gap: 13 (ref 5–15)
BUN: 10 mg/dL (ref 8–23)
CO2: 28 mmol/L (ref 22–32)
Calcium: 9.2 mg/dL (ref 8.9–10.3)
Chloride: 96 mmol/L — ABNORMAL LOW (ref 98–111)
Creatinine, Ser: 0.62 mg/dL (ref 0.44–1.00)
GFR calc Af Amer: >60 mL/min (ref >60)
GFR calc non Af Amer: >60 mL/min (ref >60)
Glucose, Bld: 132 mg/dL — ABNORMAL HIGH (ref 70–99)
Potassium: 4.1 mmol/L (ref 3.5–5.1)
Sodium: 137 mmol/L (ref 135–145)

## 2018-10-01 LAB — CBC
HCT: 49.8 % — ABNORMAL HIGH (ref 36.0–46.0)
Hemoglobin: 16.4 g/dL — ABNORMAL HIGH (ref 12.0–15.0)
MCH: 30.9 pg (ref 26.0–34.0)
MCHC: 32.9 g/dL (ref 30.0–36.0)
MCV: 93.8 fL (ref 80.0–100.0)
Platelets: 267 10*3/uL (ref 150–400)
RBC: 5.31 MIL/uL — ABNORMAL HIGH (ref 3.87–5.11)
RDW: 13.5 % (ref 11.5–15.5)
WBC: 10 10*3/uL (ref 4.0–10.5)
nRBC: 0 % (ref 0.0–0.2)

## 2018-10-01 LAB — HIV ANTIBODY (ROUTINE TESTING W REFLEX): HIV Screen 4th Generation wRfx: NONREACTIVE

## 2018-10-01 MED ORDER — BUDESONIDE-FORMOTEROL FUMARATE 160-4.5 MCG/ACT IN AERO: 2.0000 | INHALATION_SPRAY | Freq: Two times a day (BID) | RESPIRATORY_TRACT | 0 refills | Status: DC

## 2018-10-01 MED ORDER — BUPROPION HCL ER (SR) 150 MG PO TB12: ORAL_TABLET | ORAL | 0 refills | Status: DC

## 2018-10-01 MED ORDER — PREDNISONE 20 MG PO TABS: ORAL_TABLET | ORAL | 0 refills | Status: AC

## 2018-10-01 MED ORDER — TIOTROPIUM BROMIDE MONOHYDRATE 18 MCG IN CAPS: 18.0000 ug | ORAL_CAPSULE | Freq: Every day | RESPIRATORY_TRACT | 0 refills | Status: DC

## 2018-10-01 MED ORDER — ALBUTEROL SULFATE HFA 108 (90 BASE) MCG/ACT IN AERS: 2.0000 | INHALATION_SPRAY | Freq: Four times a day (QID) | RESPIRATORY_TRACT | 2 refills | Status: DC | PRN

## 2018-10-01 MED ORDER — NICOTINE 14 MG/24HR TD PT24: 14.0000 mg | MEDICATED_PATCH | Freq: Every day | TRANSDERMAL | 0 refills | Status: DC

## 2018-10-01 NOTE — Progress Notes (Signed)
SATURATION QUALIFICATIONS: (This note is used to comply with regulatory documentation for home oxygen)  Patient Saturations on Room Air at Rest = 88%  Patient Saturations on Room Air while Ambulating = 85%  Patient Saturations on 2L Liters of oxygen while Ambulating = 90%  Patient Saturations on 3L Liters of oxygen while Ambulating = 92%

## 2018-10-01 NOTE — TOC Transition Note (Signed)
Transition of Care [facility]) - CM/SW Discharge Note   Patient Details  Name: Alleene Stoy MRN: [id number] Date of Birth: 1955/06/04  [facility]) CM/SW Contact:  Carles Collet, RN Phone Number: 10/01/2018, 12:40 PM   Clinical Narrative:    Damaris Schooner w patient on the phone. Discussed medication costs. She will have oxygen for home use delivered to her room prior to DC and she has a ride home. Expected Discharge Plan and Services Expected Discharge Plan: [place] Choice: Durable Medical Equipment   Expected Discharge Date: 10/01/18               DME Arranged: Oxygen DME Agency: [employer] Date DME Agency Contacted: 10/01/18 Time DME Agency Contacted: 1200 Representative spoke with at DME Agency: brad               Final next level of care: [place] Barriers to Discharge: No Barriers Identified   Patient Goals and CMS Choice Patient states their goals for this hospitalization and ongoing recovery are:: to go home and take a shower      Discharge Placement                       Discharge Plan and Services     Post Acute Care Choice: Durable Medical Equipment          DME Arranged: Oxygen DME Agency: [employer] Date DME Agency Contacted: 10/01/18 Time DME Agency Contacted: 1200 Representative spoke with at DME Agency: brad            Social Determinants of Health ([place]) Interventions     Readmission Risk Interventions No flowsheet data found.

## 2018-10-01 NOTE — Care Management Obs Status (Signed)
[facility] NOTIFICATION   Patient Details  Name: Caroline Greer MRN: [id number] Date of Birth: January 04, 1956   Medicare Observation Status Notification Given:  Yes    Benard Halsted, LCSW 10/01/2018, 10:52 AM

## 2018-10-01 NOTE — Telephone Encounter (Signed)
Error

## 2018-10-01 NOTE — Discharge Summary (Signed)
Physician Discharge Summary  Caroline Greer MVE:[id number] DOB: 05/01/1956 DOA: 09/30/2018  PCP: [name], MD  Admit date: 09/30/2018 Discharge date: 10/01/2018  Time spent: 40 minutes  Recommendations for Outpatient Follow-up:  1. Follow up outpatient CBC/CMP 2. Follow up outpatient PFT's.  Wean O2 as able. 3. Follow up COPD meds. 4. Encourage smoking cessation. 5. Needs follow up CT imaging for pulmonary nodules  6. Follow up blood pressure as outpatient.  Started on amlodipine at discharge (called in).  Discharge Diagnoses:  Principal Problem:   Acute respiratory failure with hypoxia ([place]) Active Problems:   Essential hypertension   Tobacco dependence due to cigarettes   Depression with anxiety   Discharge Condition: stable  Diet recommendation: heart healthy  Filed Weights   09/30/18 1056 09/30/18 1556  Weight: 59.2 kg 61.5 kg    History of present illness:  Caroline Greer is Caroline Greer 63 y.o. female with medical history significant of HTN presenting with SOB.  Yesterday, she was putting strawberries away and she got very SOB.  She had to hold onto the table to stand up.  She fanned herself for Caroline Greer while and started feeling better.  This AM, she wasn't breathing any better.  She cut the grass and sawed down some trees with handsaw and after she was sneezing, coughing, and her eyes were running and she thought it was allergies.  But the breathing got worse and worse.  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  SOB has been going on for about 2-3 weeks; it is present with ambulation but has been getting worse.  +cough, chronic over time for several years (thinks related to exposures), occasionally productive of phlegm.  No fevers.  Cough is slightly worse now.  No edema.  She can't gain weight.  She was admitted for Caroline Greer presumed COPD exacerbation.  Improved with steroids, scheduled nebs.  Discharge with O2 and plan for outpatient follow up.    Hospital Course:  Acute respiratory failure with hypoxia -Patient without prior h/o respiratory issues but long-standing smoking history presenting with progressive DOE and cough -CXR negative -CTA unremarkable other than pulmonary nodules which will need CT surveillance -With O2, she has improved considerably and feels much better, requesting to go home -Suspect that patient has COPD and likely needs home O2 -Will observe overnight -COPD order set utilized -Will add steroids (IV -> PO) -She does not appear to require antibiotics at this time -Will do walk of like tomorrow to determine if she needs home O2 -Standing Duonebs, prn Albuterol nebs -She is COVID negative  HTN - Continue home Lotensin - Add amlodipine 10 mg at discharge - BP fluctuating, elevated.  She's asymptomatic.  - discussed need for f/u with PCP  Depression - discharged on Wellbutrin   Tobacco dependence -Encourage cessation.   -This was discussed with the patient and should be reviewed on an ongoing basis.   -Patch ordered at patient request. -Wellbutrin also ordered at patient request, as above.  Procedures:  none  Consultations:  none  Discharge Exam: Vitals:   10/01/18 0637 10/01/18 0831  BP: (!) 167/131   Pulse: 82 82  Resp:  18  Temp: 98.4 F (36.9 C)   SpO2: 93% 92%   Feeling well today.  Asking for discharge. Discussed COPD and hypertension and need for follow up. Asymptomatic.  General: No acute distress. Cardiovascular: Heart sounds show Caroline Greer regular rate, and rhythm. Lungs: Clear to auscultation bilaterally  Abdomen: Soft, nontender, nondistended  Neurological: Alert and oriented 3. Moves all extremities 4. Cranial nerves II through XII grossly intact. Skin: Warm and dry. No rashes or lesions. Extremities: No clubbing or cyanosis. No edema. Pedal pulses 2+. Psychiatric: Mood and affect are normal. Insight and judgment are appropriate.  Discharge  Instructions   Discharge Instructions    Call MD for:  difficulty breathing, headache or visual disturbances   Complete by:  As directed    Call MD for:  extreme fatigue   Complete by:  As directed    Call MD for:  hives   Complete by:  As directed    Call MD for:  persistant dizziness or light-headedness   Complete by:  As directed    Call MD for:  persistant nausea and vomiting   Complete by:  As directed    Call MD for:  redness, tenderness, or signs of infection (pain, swelling, redness, odor or green/yellow discharge around incision site)   Complete by:  As directed    Call MD for:  severe uncontrolled pain   Complete by:  As directed    Call MD for:  temperature >100.4   Complete by:  As directed    Diet - low sodium heart healthy   Complete by:  As directed    Discharge instructions   Complete by:  As directed    You were seen for Caroline Greer likely COPD exacerbation.  You improved with steroids and breathing treatments.  We'll discharge you with albuterol, spiriva, and Caroline Greer steroid taper.  We'll also discharge you with oxygen.  Use 2-3 liters with activity.   You need outpatient lung function tests.  Smoking cessation is important for your health.  We've prescribed wellbutrin to help with this.  Return for new, recurrent, or worsening symptoms.  Please ask your PCP to request records from this hospitalization so they know what was done and what the next steps will be.   For home use only DME oxygen   Complete by:  As directed    SATURATION QUALIFICATIONS: (This note is used to comply with regulatory documentation for home oxygen)  Patient Saturations on Room Air at Rest = 88%  Patient Saturations on Room Air while Ambulating = 85%  Patient Saturations on 2L Liters of oxygen while Ambulating = 90%  Patient Saturations on 3L Liters of oxygen while Ambulating = 92%    Mode or (Route):  Nasal cannula   Frequency:  Continuous (stationary and portable oxygen unit needed)    Oxygen delivery system:  Gas   Increase activity slowly   Complete by:  As directed      Allergies as of 10/01/2018      Reactions   Erythromycin Other (See Comments)   convulsion      Medication List    TAKE these medications   albuterol 108 (90 Base) MCG/ACT inhaler Commonly known as:  VENTOLIN HFA Inhale 2 puffs into the lungs every 6 (six) hours as needed for wheezing or shortness of breath.   aspirin EC 81 MG tablet Take 81 mg by mouth daily.   benazepril 40 MG tablet Commonly known as:  LOTENSIN Take 40 mg by mouth daily.   buPROPion 150 MG 12 hr tablet Commonly known as:  WELLBUTRIN SR Take 150 mg daily for 3 days then 150 mg twice daily   cetirizine 10 MG tablet Commonly known as:  ZYRTEC Take 10 mg by mouth daily.   Flax Seed Oil 1000 MG  Caps Take 1,000 mg by mouth daily.   nicotine 14 mg/24hr patch Commonly known as:  NICODERM CQ - dosed in mg/24 hours Place 1 patch (14 mg total) onto the skin daily. Start taking on:  Oct 02, 2018   predniSONE 20 MG tablet Commonly known as:  DELTASONE Take 2 tablets (40 mg total) by mouth daily with breakfast for 3 days, THEN 1.5 tablets (30 mg total) daily with breakfast for 3 days, THEN 1 tablet (20 mg total) daily with breakfast for 3 days, THEN 0.5 tablets (10 mg total) daily with breakfast for 3 days. Start taking on:  Oct 01, 2018   tiotropium 18 MCG inhalation capsule Commonly known as:  Spiriva HandiHaler Place 1 capsule (18 mcg total) into inhaler and inhale daily for 30 days.   vitamin B-12 1000 MCG tablet Commonly known as:  CYANOCOBALAMIN Take 1,000 mcg by mouth daily.            Durable Medical Equipment  (From admission, onward)         Start     Ordered   10/01/18 0000  For home use only DME oxygen    Comments:  SATURATION QUALIFICATIONS: (This note is used to comply with regulatory documentation for home oxygen)  Patient Saturations on Room Air at Rest = 88%  Patient Saturations on  Room Air while Ambulating = 85%  Patient Saturations on 2L Liters of oxygen while Ambulating = 90%  Patient Saturations on 3L Liters of oxygen while Ambulating = 92%   Question Answer Comment  Mode or (Route) Nasal cannula   Frequency Continuous (stationary and portable oxygen unit needed)   Oxygen delivery system Gas      10/01/18 1123         Allergies  Allergen Reactions  . Erythromycin Other (See Comments)    convulsion      The results of significant diagnostics from this hospitalization (including imaging, microbiology, ancillary and laboratory) are listed below for reference.    Significant Diagnostic Studies: Ct Angio Chest Pe W And/or Wo Contrast  Result Date: 09/30/2018 CLINICAL DATA:  Shortness of breath, cough. EXAM: CT ANGIOGRAPHY CHEST WITH CONTRAST TECHNIQUE: Multidetector CT imaging of the chest was performed using the standard protocol during bolus administration of intravenous contrast. Multiplanar CT image reconstructions and MIPs were obtained to evaluate the vascular anatomy. CONTRAST:  162mL OMNIPAQUE IOHEXOL 300 MG/ML  SOLN COMPARISON:  Radiograph of same day. FINDINGS: Cardiovascular: Satisfactory opacification of the pulmonary arteries to the segmental level. No evidence of pulmonary embolism. Normal heart size. No pericardial effusion. Coronary artery calcifications are noted. Mediastinum/Nodes: No enlarged mediastinal, hilar, or axillary lymph nodes. Thyroid gland, trachea, and esophagus demonstrate no significant findings. Lungs/Pleura: No pneumothorax or pleural effusion is noted. 6 mm nodule is noted in right upper lobe best seen on image number 21 of series 7. 6 mm subpleural nodule is noted in left upper lobe best seen on image number 16 of series 7. 4 mm nodule is noted in left lower lobe best seen on image number 49 of series 7. Upper Abdomen: No acute abnormality. Musculoskeletal: No chest wall abnormality. No acute or significant osseous findings.  Review of the MIP images confirms the above findings. IMPRESSION: No definite evidence of pulmonary embolus. Multiple pulmonary nodules are noted bilaterally, with the largest measuring 6 mm in right upper lobe. Non-contrast chest CT at 3-6 months is recommended. If the nodules are stable at time of repeat CT, then future CT at 18-24  months (from today's scan) is considered optional for low-risk patients, but is recommended for high-risk patients. This recommendation follows the consensus statement: Guidelines for Management of Incidental Pulmonary Nodules Detected on CT Images: From the Fleischner Society 2017; Radiology 2017; 284:228-243. Coronary artery calcifications are noted. Aortic Atherosclerosis (ICD10-I70.0). Electronically Signed   By: Caroline Greer M.D.   On: 09/30/2018 13:41   Dg Chest Portable 1 View  Result Date: 09/30/2018 CLINICAL DATA:  Shortness of breath for 1 month and acute chest pain today. EXAM: PORTABLE CHEST 1 VIEW COMPARISON:  09/30/2004 chest radiograph FINDINGS: UPPER limits normal heart size noted. Mild hyperinflation again identified. There is no evidence of focal airspace disease, pulmonary edema, suspicious pulmonary nodule/mass, pleural effusion, or pneumothorax. No acute bony abnormalities are identified. IMPRESSION: No evidence of acute cardiopulmonary disease. Electronically Signed   By: Caroline Greer M.D.   On: 09/30/2018 12:11    Microbiology: Recent Results (from the past 240 hour(s))  SARS Coronavirus 2 (CEPHEID- Performed in [place] hospital lab), Hosp Order     Status: None   Collection Time: 09/30/18 11:17 AM  Result Value Ref Range Status   SARS Coronavirus 2 NEGATIVE NEGATIVE Final    Comment: (NOTE) If result is NEGATIVE SARS-CoV-2 target nucleic acids are NOT DETECTED. The SARS-CoV-2 RNA is generally detectable in upper and lower  respiratory specimens during the acute phase of infection. The lowest  concentration of SARS-CoV-2 viral copies this  assay can detect is 250  copies / mL. Caroline Greer negative result does not preclude SARS-CoV-2 infection  and should not be used as the sole basis for treatment or other  patient management decisions.  Caroline Greer negative result may occur with  improper specimen collection / handling, submission of specimen other  than nasopharyngeal swab, presence of viral mutation(s) within the  areas targeted by this assay, and inadequate number of viral copies  (<250 copies / mL). Ramadan Couey negative result must be combined with clinical  observations, patient history, and epidemiological information. If result is POSITIVE SARS-CoV-2 target nucleic acids are DETECTED. The SARS-CoV-2 RNA is generally detectable in upper and lower  respiratory specimens dur ing the acute phase of infection.  Positive  results are indicative of active infection with SARS-CoV-2.  Clinical  correlation with patient history and other diagnostic information is  necessary to determine patient infection status.  Positive results do  not rule out bacterial infection or co-infection with other viruses. If result is PRESUMPTIVE POSTIVE SARS-CoV-2 nucleic acids MAY BE PRESENT.   Caroline Greer presumptive positive result was obtained on the submitted specimen  and confirmed on repeat testing.  While 2019 novel coronavirus  (SARS-CoV-2) nucleic acids may be present in the submitted sample  additional confirmatory testing may be necessary for epidemiological  and / or clinical management purposes  to differentiate between  SARS-CoV-2 and other Sarbecovirus currently known to infect humans.  If clinically indicated additional testing with an alternate test  methodology [phone]) is advised. The SARS-CoV-2 RNA is generally  detectable in upper and lower respiratory sp ecimens during the acute  phase of infection. The expected result is Negative. Fact Sheet for Patients:  StrictlyIdeas.no Fact Sheet for Healthcare  Providers: BankingDealers.co.za This test is not yet approved or cleared by the [place] FDA and has been authorized for detection and/or diagnosis of SARS-CoV-2 by FDA under an Emergency Use Authorization (EUA).  This EUA will remain in effect (meaning this test can be used) for the duration of the COVID-19 declaration under  Section 564(b)(1) of the Act, 21 U.S.C. section 360bbb-3(b)(1), unless the authorization is terminated or revoked sooner. Performed at [facility], [place] [address], [place], [place] [address]      Labs: Basic Metabolic Panel: Recent Labs  Lab 09/30/18 1110 10/01/18 0725  NA 140 137  K 3.6 4.1  CL 97* 96*  CO2 27 28  GLUCOSE 119* 132*  BUN 7* 10  CREATININE 0.74 0.62  CALCIUM 9.2 9.2   Liver Function Tests: Recent Labs  Lab 09/30/18 1110  AST 22  ALT 18  ALKPHOS 72  BILITOT 0.6  PROT 7.6  ALBUMIN 3.7   No results for input(s): LIPASE, AMYLASE in the last 168 hours. No results for input(s): AMMONIA in the last 168 hours. CBC: Recent Labs  Lab 09/30/18 1110 10/01/18 0725  WBC 9.5 10.0  NEUTROABS 6.1  --   HGB 16.9* 16.4*  HCT 51.5* 49.8*  MCV 95.9 93.8  PLT 274 267   Cardiac Enzymes: Recent Labs  Lab 09/30/18 1110  TROPONINI <0.03   BNP: BNP (last 3 results) Recent Labs    09/30/18 1102  BNP 163.6*    ProBNP (last 3 results) No results for input(s): PROBNP in the last 8760 hours.  CBG: No results for input(s): GLUCAP in the last 168 hours.     Signed:  Fayrene Helper MD.  [facility] 10/01/2018, 11:31 AM

## 2019-01-04 ENCOUNTER — Other Ambulatory Visit: Payer: Self-pay

## 2019-01-04 DIAGNOSIS — R0602 Shortness of breath: Secondary | ICD-10-CM | POA: Insufficient documentation

## 2019-01-04 DIAGNOSIS — J441 Chronic obstructive pulmonary disease with (acute) exacerbation: Secondary | ICD-10-CM | POA: Diagnosis not present

## 2019-01-04 DIAGNOSIS — Z5321 Procedure and treatment not carried out due to patient leaving prior to being seen by health care provider: Secondary | ICD-10-CM | POA: Insufficient documentation

## 2019-01-05 ENCOUNTER — Emergency Department (HOSPITAL_COMMUNITY): Payer: Medicare Other

## 2019-01-05 ENCOUNTER — Other Ambulatory Visit: Payer: Self-pay

## 2019-01-05 ENCOUNTER — Encounter (HOSPITAL_COMMUNITY): Payer: Self-pay | Admitting: Emergency Medicine

## 2019-01-05 ENCOUNTER — Inpatient Hospital Stay (HOSPITAL_COMMUNITY)
Admission: EM | Admit: 2019-01-05 | Discharge: 2019-01-07 | DRG: 190 | Disposition: A | Discharge status: Home / Self Care | Payer: Medicare Other | Attending: Internal Medicine | Admitting: Internal Medicine

## 2019-01-05 ENCOUNTER — Emergency Department (HOSPITAL_COMMUNITY)
Admission: EM | Admit: 2019-01-05 | Discharge: 2019-01-05 | Disposition: A | Discharge status: Home / Self Care | Payer: Medicare Other | Source: Home / Self Care

## 2019-01-05 DIAGNOSIS — J449 Chronic obstructive pulmonary disease, unspecified: Secondary | ICD-10-CM | POA: Diagnosis present

## 2019-01-05 DIAGNOSIS — K5900 Constipation, unspecified: Secondary | ICD-10-CM

## 2019-01-05 DIAGNOSIS — F419 Anxiety disorder, unspecified: Secondary | ICD-10-CM | POA: Diagnosis present

## 2019-01-05 DIAGNOSIS — Z7951 Long term (current) use of inhaled steroids: Secondary | ICD-10-CM

## 2019-01-05 DIAGNOSIS — J9601 Acute respiratory failure with hypoxia: Secondary | ICD-10-CM

## 2019-01-05 DIAGNOSIS — I10 Essential (primary) hypertension: Secondary | ICD-10-CM | POA: Diagnosis present

## 2019-01-05 DIAGNOSIS — F329 Major depressive disorder, single episode, unspecified: Secondary | ICD-10-CM | POA: Diagnosis present

## 2019-01-05 DIAGNOSIS — Z87891 Personal history of nicotine dependence: Secondary | ICD-10-CM | POA: Diagnosis not present

## 2019-01-05 DIAGNOSIS — Z7982 Long term (current) use of aspirin: Secondary | ICD-10-CM | POA: Diagnosis not present

## 2019-01-05 DIAGNOSIS — Z114 Encounter for screening for human immunodeficiency virus [HIV]: Secondary | ICD-10-CM | POA: Diagnosis not present

## 2019-01-05 DIAGNOSIS — J9611 Chronic respiratory failure with hypoxia: Secondary | ICD-10-CM | POA: Diagnosis present

## 2019-01-05 DIAGNOSIS — J9621 Acute and chronic respiratory failure with hypoxia: Secondary | ICD-10-CM | POA: Diagnosis present

## 2019-01-05 DIAGNOSIS — Z20828 Contact with and (suspected) exposure to other viral communicable diseases: Secondary | ICD-10-CM | POA: Diagnosis present

## 2019-01-05 DIAGNOSIS — R0602 Shortness of breath: Secondary | ICD-10-CM | POA: Diagnosis present

## 2019-01-05 DIAGNOSIS — J441 Chronic obstructive pulmonary disease with (acute) exacerbation: Secondary | ICD-10-CM | POA: Diagnosis present

## 2019-01-05 DIAGNOSIS — Z79899 Other long term (current) drug therapy: Secondary | ICD-10-CM

## 2019-01-05 DIAGNOSIS — Z881 Allergy status to other antibiotic agents status: Secondary | ICD-10-CM | POA: Diagnosis not present

## 2019-01-05 DIAGNOSIS — Z9981 Dependence on supplemental oxygen: Secondary | ICD-10-CM | POA: Diagnosis not present

## 2019-01-05 HISTORY — DX: Chronic obstructive pulmonary disease with (acute) exacerbation: J44.1

## 2019-01-05 LAB — CBC WITH DIFFERENTIAL/PLATELET
Abs Immature Granulocytes: 0.02 10*3/uL (ref 0.00–0.07)
Basophils Absolute: 0.1 10*3/uL (ref 0.0–0.1)
Basophils Relative: 1 %
Eosinophils Absolute: 0.6 10*3/uL — ABNORMAL HIGH (ref 0.0–0.5)
Eosinophils Relative: 6 %
HCT: 51 % — ABNORMAL HIGH (ref 36.0–46.0)
Hemoglobin: 15.9 g/dL — ABNORMAL HIGH (ref 12.0–15.0)
Immature Granulocytes: 0 %
Lymphocytes Relative: 26 %
Lymphs Abs: 2.5 10*3/uL (ref 0.7–4.0)
MCH: 27.5 pg (ref 26.0–34.0)
MCHC: 31.2 g/dL (ref 30.0–36.0)
MCV: 88.1 fL (ref 80.0–100.0)
Monocytes Absolute: 0.6 10*3/uL (ref 0.1–1.0)
Monocytes Relative: 7 %
Neutro Abs: 5.6 10*3/uL (ref 1.7–7.7)
Neutrophils Relative %: 60 %
Platelets: 287 10*3/uL (ref 150–400)
RBC: 5.79 MIL/uL — ABNORMAL HIGH (ref 3.87–5.11)
RDW: 18.1 % — ABNORMAL HIGH (ref 11.5–15.5)
WBC: 9.4 10*3/uL (ref 4.0–10.5)
nRBC: 0 % (ref 0.0–0.2)

## 2019-01-05 LAB — POCT I-STAT 7, (LYTES, BLD GAS, ICA,H+H)
Acid-Base Excess: 3 mmol/L — ABNORMAL HIGH (ref 0.0–2.0)
Bicarbonate: 31.5 mmol/L — ABNORMAL HIGH (ref 20.0–28.0)
Calcium, Ion: 1.24 mmol/L (ref 1.15–1.40)
HCT: 51 % — ABNORMAL HIGH (ref 36.0–46.0)
Hemoglobin: 17.3 g/dL — ABNORMAL HIGH (ref 12.0–15.0)
O2 Saturation: 86 %
Potassium: 3.9 mmol/L (ref 3.5–5.1)
Sodium: 141 mmol/L (ref 135–145)
TCO2: 33 mmol/L — ABNORMAL HIGH (ref 22–32)
pCO2 arterial: 62.2 mmHg — ABNORMAL HIGH (ref 32.0–48.0)
pH, Arterial: 7.312 — ABNORMAL LOW (ref 7.350–7.450)
pO2, Arterial: 57 mmHg — ABNORMAL LOW (ref 83.0–108.0)

## 2019-01-05 LAB — TROPONIN I (HIGH SENSITIVITY)
Troponin I (High Sensitivity): 12 ng/L (ref <18)
Troponin I (High Sensitivity): 11 ng/L (ref <18)

## 2019-01-05 LAB — I-STAT CHEM 8, ED
BUN: 6 mg/dL — ABNORMAL LOW (ref 8–23)
Calcium, Ion: 1.17 mmol/L (ref 1.15–1.40)
Chloride: 97 mmol/L — ABNORMAL LOW (ref 98–111)
Creatinine, Ser: 0.7 mg/dL (ref 0.44–1.00)
Glucose, Bld: 128 mg/dL — ABNORMAL HIGH (ref 70–99)
HCT: 54 % — ABNORMAL HIGH (ref 36.0–46.0)
Hemoglobin: 18.4 g/dL — ABNORMAL HIGH (ref 12.0–15.0)
Potassium: 3.9 mmol/L (ref 3.5–5.1)
Sodium: 140 mmol/L (ref 135–145)
TCO2: 31 mmol/L (ref 22–32)

## 2019-01-05 LAB — BRAIN NATRIURETIC PEPTIDE: B Natriuretic Peptide: 161.4 pg/mL — ABNORMAL HIGH (ref 0.0–100.0)

## 2019-01-05 LAB — SARS CORONAVIRUS 2 BY RT PCR (HOSPITAL ORDER, PERFORMED IN ~~LOC~~ HOSPITAL LAB): SARS Coronavirus 2: NEGATIVE

## 2019-01-05 MED ORDER — METHYLPREDNISOLONE SODIUM SUCC 40 MG IJ SOLR: 40.0000 mg | Freq: Three times a day (TID) | INTRAMUSCULAR | Status: DC

## 2019-01-05 MED ORDER — LORATADINE 10 MG PO TABS
10.0000 mg | ORAL_TABLET | Freq: Once | ORAL | Status: AC
  Administered 2019-01-05: 10 mg via ORAL
  Filled 2019-01-05: qty 1

## 2019-01-05 MED ORDER — IPRATROPIUM-ALBUTEROL 0.5-2.5 (3) MG/3ML IN SOLN: 3.0000 mL | RESPIRATORY_TRACT | Status: DC | PRN

## 2019-01-05 MED ORDER — BUDESONIDE 0.5 MG/2ML IN SUSP
2.0000 mg | Freq: Two times a day (BID) | RESPIRATORY_TRACT | Status: DC
  Administered 2019-01-06 – 2019-01-07 (×3): 2 mg via RESPIRATORY_TRACT
  Filled 2019-01-05 (×3): qty 8

## 2019-01-05 MED ORDER — ALPRAZOLAM 0.5 MG PO TABS
0.5000 mg | ORAL_TABLET | Freq: Every evening | ORAL | Status: DC | PRN
  Administered 2019-01-05: 0.5 mg via ORAL
  Filled 2019-01-05: qty 1

## 2019-01-05 MED ORDER — HYDRALAZINE HCL 20 MG/ML IJ SOLN
5.0000 mg | INTRAMUSCULAR | Status: DC | PRN
  Administered 2019-01-07: 06:00:00 5 mg via INTRAVENOUS
  Filled 2019-01-05: qty 1

## 2019-01-05 MED ORDER — METHYLPREDNISOLONE SODIUM SUCC 125 MG IJ SOLR
125.0000 mg | Freq: Once | INTRAMUSCULAR | Status: AC
  Administered 2019-01-05: 125 mg via INTRAVENOUS
  Filled 2019-01-05: qty 2

## 2019-01-05 MED ORDER — AMLODIPINE BESYLATE 2.5 MG PO TABS
2.5000 mg | ORAL_TABLET | Freq: Every day | ORAL | Status: DC
  Administered 2019-01-05 – 2019-01-07 (×3): 2.5 mg via ORAL
  Filled 2019-01-05 (×3): qty 1

## 2019-01-05 MED ORDER — IPRATROPIUM-ALBUTEROL 0.5-2.5 (3) MG/3ML IN SOLN
3.0000 mL | Freq: Three times a day (TID) | RESPIRATORY_TRACT | Status: DC
  Administered 2019-01-05 – 2019-01-07 (×4): 3 mL via RESPIRATORY_TRACT
  Filled 2019-01-05 (×6): qty 3

## 2019-01-05 MED ORDER — AEROCHAMBER PLUS FLO-VU LARGE MISC
Status: AC
  Administered 2019-01-05: 05:00:00
  Filled 2019-01-05: qty 1

## 2019-01-05 MED ORDER — ALBUTEROL SULFATE (2.5 MG/3ML) 0.083% IN NEBU: 2.5000 mg | INHALATION_SOLUTION | RESPIRATORY_TRACT | Status: DC | PRN

## 2019-01-05 MED ORDER — EPINEPHRINE 0.3 MG/0.3ML IJ SOAJ
0.3000 mg | Freq: Once | INTRAMUSCULAR | Status: DC
  Filled 2019-01-05: qty 0.3

## 2019-01-05 MED ORDER — ENOXAPARIN SODIUM 40 MG/0.4ML ~~LOC~~ SOLN
40.0000 mg | Freq: Every day | SUBCUTANEOUS | Status: DC
  Filled 2019-01-05 (×3): qty 0.4

## 2019-01-05 MED ORDER — METHYLPREDNISOLONE SODIUM SUCC 40 MG IJ SOLR
40.0000 mg | Freq: Every day | INTRAMUSCULAR | Status: DC
  Administered 2019-01-06: 40 mg via INTRAVENOUS
  Filled 2019-01-05 (×2): qty 1

## 2019-01-05 MED ORDER — IPRATROPIUM BROMIDE 0.02 % IN SOLN
0.5000 mg | Freq: Four times a day (QID) | RESPIRATORY_TRACT | Status: DC
  Administered 2019-01-05 (×2): 0.5 mg via RESPIRATORY_TRACT
  Filled 2019-01-05 (×3): qty 2.5

## 2019-01-05 MED ORDER — FLAX SEED OIL 1000 MG PO CAPS: 1000.0000 mg | ORAL_CAPSULE | Freq: Every day | ORAL | Status: DC

## 2019-01-05 MED ORDER — LEVALBUTEROL HCL 0.63 MG/3ML IN NEBU
0.6300 mg | INHALATION_SOLUTION | Freq: Four times a day (QID) | RESPIRATORY_TRACT | Status: DC
  Administered 2019-01-05 (×2): 0.63 mg via RESPIRATORY_TRACT
  Filled 2019-01-05 (×3): qty 3

## 2019-01-05 MED ORDER — NICOTINE 14 MG/24HR TD PT24
14.0000 mg | MEDICATED_PATCH | Freq: Every day | TRANSDERMAL | Status: DC
  Administered 2019-01-05 – 2019-01-07 (×3): 14 mg via TRANSDERMAL
  Filled 2019-01-05 (×3): qty 1

## 2019-01-05 MED ORDER — ASPIRIN EC 81 MG PO TBEC
81.0000 mg | DELAYED_RELEASE_TABLET | Freq: Every day | ORAL | Status: DC
  Administered 2019-01-05 – 2019-01-07 (×3): 81 mg via ORAL
  Filled 2019-01-05 (×3): qty 1

## 2019-01-05 MED ORDER — LORATADINE 10 MG PO TABS
10.0000 mg | ORAL_TABLET | Freq: Every day | ORAL | Status: DC
  Administered 2019-01-06 – 2019-01-07 (×2): 10 mg via ORAL
  Filled 2019-01-05 (×3): qty 1

## 2019-01-05 MED ORDER — METHYLPREDNISOLONE SODIUM SUCC 125 MG IJ SOLR
60.0000 mg | Freq: Three times a day (TID) | INTRAMUSCULAR | Status: DC
  Administered 2019-01-05: 60 mg via INTRAVENOUS
  Filled 2019-01-05: qty 2

## 2019-01-05 MED ORDER — SODIUM CHLORIDE 0.9 % IV SOLN
1.0000 g | Freq: Every day | INTRAVENOUS | Status: DC
  Administered 2019-01-05: 1 g via INTRAVENOUS
  Filled 2019-01-05: qty 10

## 2019-01-05 MED ORDER — ALBUTEROL SULFATE HFA 108 (90 BASE) MCG/ACT IN AERS
8.0000 | INHALATION_SPRAY | RESPIRATORY_TRACT | Status: DC
  Administered 2019-01-05 (×2): 8 via RESPIRATORY_TRACT
  Filled 2019-01-05: qty 6.7

## 2019-01-05 MED ORDER — ALBUTEROL SULFATE (2.5 MG/3ML) 0.083% IN NEBU: 5.0000 mg | INHALATION_SOLUTION | Freq: Once | RESPIRATORY_TRACT | Status: DC

## 2019-01-05 MED ORDER — BISACODYL 5 MG PO TBEC
10.0000 mg | DELAYED_RELEASE_TABLET | Freq: Once | ORAL | Status: AC
  Administered 2019-01-05: 10 mg via ORAL
  Filled 2019-01-05: qty 2

## 2019-01-05 MED ORDER — ALBUTEROL SULFATE HFA 108 (90 BASE) MCG/ACT IN AERS
10.0000 | INHALATION_SPRAY | Freq: Once | RESPIRATORY_TRACT | Status: AC
  Administered 2019-01-05: 10 via RESPIRATORY_TRACT
  Filled 2019-01-05: qty 6.7

## 2019-01-05 MED ORDER — SIMETHICONE 80 MG PO CHEW: 80.0000 mg | CHEWABLE_TABLET | Freq: Four times a day (QID) | ORAL | Status: DC | PRN

## 2019-01-05 MED ORDER — IPRATROPIUM BROMIDE HFA 17 MCG/ACT IN AERS
2.0000 | INHALATION_SPRAY | Freq: Once | RESPIRATORY_TRACT | Status: AC
  Administered 2019-01-05: 2 via RESPIRATORY_TRACT
  Filled 2019-01-05 (×2): qty 12.9

## 2019-01-05 MED ORDER — VITAMIN B-12 1000 MCG PO TABS
1000.0000 ug | ORAL_TABLET | Freq: Every day | ORAL | Status: DC
  Administered 2019-01-05 – 2019-01-07 (×3): 1000 ug via ORAL
  Filled 2019-01-05 (×3): qty 1

## 2019-01-05 MED ORDER — BUDESONIDE 0.5 MG/2ML IN SUSP
2.0000 mg | Freq: Four times a day (QID) | RESPIRATORY_TRACT | Status: DC
  Administered 2019-01-05: 2 mg via RESPIRATORY_TRACT
  Filled 2019-01-05 (×3): qty 8

## 2019-01-05 MED ORDER — MAGNESIUM SULFATE 2 GM/50ML IV SOLN
2.0000 g | Freq: Once | INTRAVENOUS | Status: AC
  Administered 2019-01-05: 2 g via INTRAVENOUS
  Filled 2019-01-05: qty 50

## 2019-01-05 NOTE — ED Notes (Signed)
Patient was experiencing SOB and needed to go to bathroom.  While ambulating to restroom patient 02 saturation remained around 77% RA.  Patient had a steady gait.

## 2019-01-05 NOTE — ED Notes (Signed)
Patient ambulated to restroom.  Patient Oxygen saturation remained at above 92%

## 2019-01-05 NOTE — ED Provider Notes (Signed)
[facility] EMERGENCY DEPARTMENT Provider Note   CSN: [id number] Arrival date & time: 01/05/19  0203     History   Chief Complaint Chief Complaint  Patient presents with  . Respiratory Distress    HPI Anette Barra is a 63 y.o. female.     The history is limited by the condition of the patient.  Shortness of Breath Severity:  Severe Onset quality:  Gradual Timing:  Constant Progression:  Worsening Chronicity:  Recurrent Context: pollens   Relieved by:  Nothing Worsened by:  Nothing Ineffective treatments:  None tried Associated symptoms: wheezing   Associated symptoms: no fever   Risk factors: tobacco use   Risk factors: no hx of PE/DVT     Past Medical History:  Diagnosis Date  . Hypertension     Patient Active Problem List   Diagnosis Date Noted  . Acute respiratory failure with hypoxia ([place]) 09/30/2018  . Essential hypertension 09/30/2018  . Tobacco dependence due to cigarettes 09/30/2018  . Depression with anxiety 09/30/2018    Past Surgical History:  Procedure Laterality Date  . LAPAROTOMY  1974     OB History   No obstetric history on file.      Home Medications    Prior to Admission medications   Medication Sig Start Date End Date Taking? Authorizing Provider  albuterol (VENTOLIN HFA) 108 (90 Base) MCG/ACT inhaler Inhale 2 puffs into the lungs every 6 (six) hours as needed for wheezing or shortness of breath. 10/01/18   Elodia Florence., MD  aspirin EC 81 MG tablet Take 81 mg by mouth daily.    [provider]  benazepril (LOTENSIN) 40 MG tablet Take 40 mg by mouth daily. 07/06/18   [provider]  budesonide-formoterol (SYMBICORT) 160-4.5 MCG/ACT inhaler Inhale 2 puffs into the lungs 2 (two) times a day. 10/01/18   Elodia Florence., MD  buPROPion [facility] SR) 150 MG 12 hr tablet Take 150 mg daily for 3 days then 150 mg twice daily 10/01/18   Elodia Florence., MD  cetirizine (ZYRTEC) 10  MG tablet Take 10 mg by mouth daily.    [provider]  Flaxseed, Linseed, (FLAX SEED OIL) 1000 MG CAPS Take 1,000 mg by mouth daily.    [provider]  nicotine (NICODERM CQ - DOSED IN MG/24 HOURS) 14 mg/24hr patch Place 1 patch (14 mg total) onto the skin daily. 10/02/18   Elodia Florence., MD  vitamin B-12 (CYANOCOBALAMIN) 1000 MCG tablet Take 1,000 mcg by mouth daily.    [provider]    Family History No family history on file.  Social History Social History   Tobacco Use  . Smoking status: Current Every Day Smoker    Packs/day: 0.67    Years: 43.00    Pack years: 28.81    Types: Cigarettes  . Smokeless tobacco: Never Used  Substance Use Topics  . Alcohol use: Yes    Comment: gets 6 beers on a weekend and drinks them  . Drug use: Not Currently    Types: Marijuana    Comment: tried to smoke some about a month ago     Allergies   Erythromycin   Review of Systems Review of Systems  Unable to perform ROS: Acuity of condition  Constitutional: Negative for fever.  Eyes: Negative for visual disturbance.  Respiratory: Positive for shortness of breath and wheezing.      Physical Exam Updated Vital Signs BP (!) 153/87  Pulse 79   Resp 17   SpO2 94%   Physical Exam Vitals signs and nursing note reviewed.  Constitutional:      General: She is in acute distress.     Appearance: She is normal weight.  HENT:     Head: Normocephalic and atraumatic.     Nose: Nose normal.  Eyes:     Conjunctiva/sclera: Conjunctivae normal.     Pupils: Pupils are equal, round, and reactive to light.  Neck:     Musculoskeletal: Normal range of motion and neck supple.  Cardiovascular:     Rate and Rhythm: Normal rate and regular rhythm.     Pulses: Normal pulses.     Heart sounds: Normal heart sounds.  Pulmonary:     Effort: Tachypnea and accessory muscle usage present.     Breath sounds: Decreased air movement present. No rales.  Abdominal:      General: Abdomen is flat. Bowel sounds are normal.     Tenderness: There is no abdominal tenderness. There is no guarding.  Musculoskeletal: Normal range of motion.  Skin:    General: Skin is warm and dry.     Capillary Refill: Capillary refill takes less than 2 seconds.  Neurological:     General: No focal deficit present.     Mental Status: She is alert and oriented to person, place, and time.      ED Treatments / Results  Labs (all labs ordered are listed, but only abnormal results are displayed) Results for orders placed or performed during the hospital encounter of 01/05/19  CBC with Differential/Platelet  Result Value Ref Range   WBC 9.4 4.0 - 10.5 K/uL   RBC 5.79 (H) 3.87 - 5.11 MIL/uL   Hemoglobin 15.9 (H) 12.0 - 15.0 g/dL   HCT 51.0 (H) 36.0 - 46.0 %   MCV 88.1 80.0 - 100.0 fL   MCH 27.5 26.0 - 34.0 pg   MCHC 31.2 30.0 - 36.0 g/dL   RDW 18.1 (H) 11.5 - 15.5 %   Platelets 287 150 - 400 K/uL   nRBC 0.0 0.0 - 0.2 %   Neutrophils Relative % 60 %   Neutro Abs 5.6 1.7 - 7.7 K/uL   Lymphocytes Relative 26 %   Lymphs Abs 2.5 0.7 - 4.0 K/uL   Monocytes Relative 7 %   Monocytes Absolute 0.6 0.1 - 1.0 K/uL   Eosinophils Relative 6 %   Eosinophils Absolute 0.6 (H) 0.0 - 0.5 K/uL   Basophils Relative 1 %   Basophils Absolute 0.1 0.0 - 0.1 K/uL   Immature Granulocytes 0 %   Abs Immature Granulocytes 0.02 0.00 - 0.07 K/uL  Brain natriuretic peptide  Result Value Ref Range   B Natriuretic Peptide 161.4 (H) 0.0 - 100.0 pg/mL  I-stat chem 8, ED (not at [facility] or [facility])  Result Value Ref Range   Sodium 140 135 - 145 mmol/L   Potassium 3.9 3.5 - 5.1 mmol/L   Chloride 97 (L) 98 - 111 mmol/L   BUN 6 (L) 8 - 23 mg/dL   Creatinine, Ser 0.70 0.44 - 1.00 mg/dL   Glucose, Bld 128 (H) 70 - 99 mg/dL   Calcium, Ion 1.17 1.15 - 1.40 mmol/L   TCO2 31 22 - 32 mmol/L   Hemoglobin 18.4 (H) 12.0 - 15.0 g/dL   HCT 54.0 (H) 36.0 - 46.0 %  Troponin I (High Sensitivity)  Result Value Ref  Range   Troponin I (High Sensitivity) 12 <18 ng/L  Dg Chest 2 View  Result Date: 01/05/2019 CLINICAL DATA:  Shortness of breath EXAM: CHEST - 2 VIEW COMPARISON:  CTA chest dated 09/30/2018 FINDINGS: Lungs are clear.  No pleural effusion or pneumothorax. The heart is normal in size. Visualized osseous structures are within normal limits. IMPRESSION: Normal chest radiographs. Electronically Signed   By: Julian Hy M.D.   On: 01/05/2019 00:54   Dg Chest Portable 1 View  Result Date: 01/05/2019 CLINICAL DATA:  63 year old female with respiratory distress. EXAM: PORTABLE CHEST 1 VIEW COMPARISON:  Chest radiograph dated 01/05/2019 and CT dated 09/30/2018 FINDINGS: There is no focal consolidation, pleural effusion, or pneumothorax. There is mild cardiomegaly. There is background of mild centrilobular emphysema. Atherosclerotic calcification the aorta. No acute osseous pathology. IMPRESSION: 1. No acute cardiopulmonary process. 2. Mild cardiomegaly. Electronically Signed   By: Anner Crete M.D.   On: 01/05/2019 02:37    EKG EKG Interpretation  Date/Time:  Friday January 05 2019 02:22:21 EDT Ventricular Rate:  83 PR Interval:    QRS Duration: 98 QT Interval:  434 QTC Calculation: 510 R Axis:   110 Text Interpretation:  Sinus rhythm Right axis deviation Nonspecific T abnormalities, diffuse leads Prolonged QT interval Confirmed by Dory Horn) on 01/05/2019 2:27:53 AM   Radiology Dg Chest 2 View  Result Date: 01/05/2019 CLINICAL DATA:  Shortness of breath EXAM: CHEST - 2 VIEW COMPARISON:  CTA chest dated 09/30/2018 FINDINGS: Lungs are clear.  No pleural effusion or pneumothorax. The heart is normal in size. Visualized osseous structures are within normal limits. IMPRESSION: Normal chest radiographs. Electronically Signed   By: Julian Hy M.D.   On: 01/05/2019 00:54   Dg Chest Portable 1 View  Result Date: 01/05/2019 CLINICAL DATA:  63 year old female with respiratory  distress. EXAM: PORTABLE CHEST 1 VIEW COMPARISON:  Chest radiograph dated 01/05/2019 and CT dated 09/30/2018 FINDINGS: There is no focal consolidation, pleural effusion, or pneumothorax. There is mild cardiomegaly. There is background of mild centrilobular emphysema. Atherosclerotic calcification the aorta. No acute osseous pathology. IMPRESSION: 1. No acute cardiopulmonary process. 2. Mild cardiomegaly. Electronically Signed   By: Anner Crete M.D.   On: 01/05/2019 02:37    Procedures Procedures (including critical care time)  Medications Ordered in ED Medications  ipratropium (ATROVENT HFA) inhaler 2 puff (has no administration in time range)  EPINEPHrine (EPI-PEN) injection 0.3 mg (0 mg Intramuscular Hold 01/05/19 0336)  albuterol (VENTOLIN HFA) 108 (90 Base) MCG/ACT inhaler 8 puff (has no administration in time range)  AeroChamber Plus Flo-Vu Large MISC (has no administration in time range)  albuterol (VENTOLIN HFA) 108 (90 Base) MCG/ACT inhaler 10 puff (10 puffs Inhalation Given 01/05/19 0231)  methylPREDNISolone sodium succinate (SOLU-MEDROL) 125 mg/2 mL injection 125 mg (125 mg Intravenous Given 01/05/19 0232)  magnesium sulfate IVPB 2 g 50 mL (0 g Intravenous Stopped 01/05/19 0336)  loratadine (CLARITIN) tablet 10 mg (10 mg Oral Given 01/05/19 0233)     Final Clinical Impressions(s) / ED Diagnoses   COPD exacerbation will need admission.   Lorree Millar, MD 01/05/19 [phone]

## 2019-01-05 NOTE — H&P (Signed)
History and Physical    Caroline Greer ATF:[id number] DOB: 1955-06-19 DOA: 01/05/2019  PCP: [name], MD Patient coming from: Home  Chief Complaint: Respiratory distress  HPI: Caroline Greer is a 63 y.o. female with medical history significant of hypertension, depression, tobacco use, admission for COPD exacerbation in May 2020 presenting to the hospital for evaluation of respiratory distress.  Patient states she has had shortness of breath for a while but it became worse 3 days ago when she was cutting grass.  Since then she has also been wheezing and coughing.  Denies any chest pain or fevers.  States her doctor gave her Ruthe Mannan which she has been using but it does not help much.  States she was previously smoking cigarettes for very long time but quit since her hospitalization in May.  No other complaints.  ED Course: SPO2 65% on 2 L supplemental oxygen, now requiring 5 L supplemental oxygen.  Tachypneic.  No leukocytosis.  High-sensitivity troponin 12.  BNP 161, similar to labs done 3 months ago.  COVID-19 rapid test negative.  Chest x-ray with mild centrilolobular emphysema and no acute cardiopulmonary process.  Patient received albuterol-ipratropium MDI treatments, Claritin, Solu-Medrol 125 mg, IV magnesium 2 g.  Admission requested for COPD exacerbation.  Review of Systems:  All systems reviewed and apart from history of presenting illness, are negative.  Past Medical History:  Diagnosis Date   Hypertension     Past Surgical History:  Procedure Laterality Date   LAPAROTOMY  1974     reports that she has been smoking cigarettes. She has a 28.81 pack-year smoking history. She has never used smokeless tobacco. She reports current alcohol use. She reports previous drug use. Drug: Marijuana.  Allergies  Allergen Reactions   Erythromycin Other (See Comments)    convulsion    No family history on file.  Prior to Admission medications   Medication Sig Start Date End Date  Taking? Authorizing Provider  albuterol (VENTOLIN HFA) 108 (90 Base) MCG/ACT inhaler Inhale 2 puffs into the lungs every 6 (six) hours as needed for wheezing or shortness of breath. 10/01/18   Elodia Florence., MD  aspirin EC 81 MG tablet Take 81 mg by mouth daily.    [provider]  benazepril (LOTENSIN) 40 MG tablet Take 40 mg by mouth daily. 07/06/18   [provider]  budesonide-formoterol (SYMBICORT) 160-4.5 MCG/ACT inhaler Inhale 2 puffs into the lungs 2 (two) times a day. 10/01/18   Elodia Florence., MD  buPROPion [facility] SR) 150 MG 12 hr tablet Take 150 mg daily for 3 days then 150 mg twice daily 10/01/18   Elodia Florence., MD  cetirizine (ZYRTEC) 10 MG tablet Take 10 mg by mouth daily.    [provider]  Flaxseed, Linseed, (FLAX SEED OIL) 1000 MG CAPS Take 1,000 mg by mouth daily.    [provider]  nicotine (NICODERM CQ - DOSED IN MG/24 HOURS) 14 mg/24hr patch Place 1 patch (14 mg total) onto the skin daily. 10/02/18   Elodia Florence., MD  vitamin B-12 (CYANOCOBALAMIN) 1000 MCG tablet Take 1,000 mcg by mouth daily.    [provider]    Physical Exam: Vitals:   01/05/19 0400 01/05/19 0430 01/05/19 0500 01/05/19 0530  BP: (!) 145/75 (!) 157/87 (!) 151/84 (!) 165/84  Pulse: 83 78 88 (!) 106  Resp: (!) 22 (!) 24 20 (!) 21  SpO2: 90% 91% 91% 93%    Physical Exam  Constitutional: She is oriented to person, place, and time. She appears well-developed and well-nourished. No distress.  HENT:  Head: Normocephalic.  Eyes: Right eye exhibits no discharge. Left eye exhibits no discharge.  Neck: Neck supple.  Cardiovascular: Regular rhythm and intact distal pulses.  Tachycardic  Pulmonary/Chest:  On 6 L supplemental oxygen via nasal cannula Speaking clearly in full sentences although does have slight increase in work of breathing Diminished breath sounds bilaterally with end expiratory wheezing  Abdominal: Soft.  Bowel sounds are normal. She exhibits no distension. There is no abdominal tenderness. There is no guarding.  Musculoskeletal:        General: No edema.  Neurological: She is alert and oriented to person, place, and time.  Skin: Skin is warm and dry. She is not diaphoretic.     Labs on Admission: I have personally reviewed following labs and imaging studies  CBC: Recent Labs  Lab 01/05/19 0219 01/05/19 0225  WBC 9.4  --   NEUTROABS 5.6  --   HGB 15.9* 18.4*  HCT 51.0* 54.0*  MCV 88.1  --   PLT 287  --    Basic Metabolic Panel: Recent Labs  Lab 01/05/19 0225  NA 140  K 3.9  CL 97*  GLUCOSE 128*  BUN 6*  CREATININE 0.70   GFR: CrCl cannot be calculated (Unknown ideal weight.). Liver Function Tests: No results for input(s): AST, ALT, ALKPHOS, BILITOT, PROT, ALBUMIN in the last 168 hours. No results for input(s): LIPASE, AMYLASE in the last 168 hours. No results for input(s): AMMONIA in the last 168 hours. Coagulation Profile: No results for input(s): INR, PROTIME in the last 168 hours. Cardiac Enzymes: No results for input(s): CKTOTAL, CKMB, CKMBINDEX, TROPONINI in the last 168 hours. BNP (last 3 results) No results for input(s): PROBNP in the last 8760 hours. HbA1C: No results for input(s): HGBA1C in the last 72 hours. CBG: No results for input(s): GLUCAP in the last 168 hours. Lipid Profile: No results for input(s): CHOL, HDL, LDLCALC, TRIG, CHOLHDL, LDLDIRECT in the last 72 hours. Thyroid Function Tests: No results for input(s): TSH, T4TOTAL, FREET4, T3FREE, THYROIDAB in the last 72 hours. Anemia Panel: No results for input(s): VITAMINB12, FOLATE, FERRITIN, TIBC, IRON, RETICCTPCT in the last 72 hours. Urine analysis:    Component Value Date/Time   COLORURINE YELLOW 12/24/2007 1833   APPEARANCEUR CLEAR 12/24/2007 1833   LABSPEC 1.008 12/24/2007 1833   PHURINE 6.0 12/24/2007 1833   GLUCOSEU NEGATIVE 12/24/2007 1833   HGBUR NEGATIVE 12/24/2007 1833    BILIRUBINUR NEGATIVE 12/24/2007 1833   KETONESUR NEGATIVE 12/24/2007 1833   PROTEINUR NEGATIVE 12/24/2007 1833   UROBILINOGEN 0.2 12/24/2007 1833   NITRITE NEGATIVE 12/24/2007 1833   LEUKOCYTESUR MODERATE (A) 12/24/2007 1833    Radiological Exams on Admission: Dg Chest 2 View  Result Date: 01/05/2019 CLINICAL DATA:  Shortness of breath EXAM: CHEST - 2 VIEW COMPARISON:  CTA chest dated 09/30/2018 FINDINGS: Lungs are clear.  No pleural effusion or pneumothorax. The heart is normal in size. Visualized osseous structures are within normal limits. IMPRESSION: Normal chest radiographs. Electronically Signed   By: Julian Hy M.D.   On: 01/05/2019 00:54   Dg Chest Portable 1 View  Result Date: 01/05/2019 CLINICAL DATA:  63 year old female with respiratory distress. EXAM: PORTABLE CHEST 1 VIEW COMPARISON:  Chest radiograph dated 01/05/2019 and CT dated 09/30/2018 FINDINGS: There is no focal consolidation, pleural effusion, or pneumothorax. There is mild cardiomegaly. There is background of mild centrilobular emphysema. Atherosclerotic calcification the  aorta. No acute osseous pathology. IMPRESSION: 1. No acute cardiopulmonary process. 2. Mild cardiomegaly. Electronically Signed   By: Anner Crete M.D.   On: 01/05/2019 02:37    EKG: Independently reviewed.  Sinus rhythm, nonspecific T wave abnormalities, baseline wander.  Assessment/Plan Principal Problem:   COPD with acute exacerbation (HCC) Active Problems:   Acute respiratory failure with hypoxia (HCC)   Essential hypertension   Encounter for screening for HIV   Acute hypoxic respiratory failure secondary to suspected COPD exacerbation SPO2 65% on 2 L supplemental oxygen, currently requiring 6 L supplemental oxygen.  Wheezing on exam with decreased air entry bilaterally.  Slightly tachypneic.  Tachycardic after receiving albuterol treatments.  COVID-19 rapid test negative.  No leukocytosis. Chest x-ray with mild centrilolobular  emphysema and no acute cardiopulmonary process. Patient received albuterol-ipratropium MDI treatments, Claritin, Solu-Medrol 125 mg, IV magnesium 2 g in the ED. -Levalbuterol-ipratropium every 6 hours -Albuterol nebulizer every 2 hours as needed -Pulmicort twice daily -Solu-Medrol 60 mg every 8 hours -Ceftriaxone given severity of illness -ABG -Continue supplemental oxygen, wean as tolerated -Continuous pulse ox -No PFTs in the chart.  Will need outpatient follow-up.  Hypertension Systolic in the 734K to 876O. -Hydralazine PRN  HIV screening The patient falls between the ages of 13-64 and should be screened for HIV, therefore HIV testing ordered.  Pharmacy med rec pending.  DVT prophylaxis: Lovenox Code Status: Discussed at length with the patient.  She wishes to be full code. Family Communication: No family available at this time. Disposition Plan: Anticipate discharge after clinical improvement. Consults called: None Admission status: It is my clinical opinion that referral for OBSERVATION is reasonable and necessary in this patient based on the above information provided. The aforementioned taken together are felt to place the patient at high risk for further clinical deterioration. However it is anticipated that the patient may be medically stable for discharge from the hospital within 24 to 48 hours.  The medical decision making on this patient was of high complexity and the patient is at high risk for clinical deterioration, therefore this is a level 3 visit.  Shela Leff MD [facility] Pager [phone]  If 7PM-7AM, please contact night-coverage [email or web address] Password [facility]  01/05/2019, 6:31 AM

## 2019-01-05 NOTE — Progress Notes (Signed)
Karmel Patricelli [id number] Admission Data: 01/05/2019 6:29 PM Attending Provider: Tawni Millers  [id number], [name], MD Consults/ Treatment Team:   Kashena Novitski is a 63 y.o. female patient admitted from ED awake, alert  & orientated  X 3,  Full Code, VSS - Blood pressure (!) 155/85, pulse 76, temperature 98.1 F (36.7 C), resp. rate 18, SpO2 97 %., O2    4 L nasal cannular, no c/o shortness of breath, no c/o chest pain, no distress noted. Tele # [phone] placed and pt is currently running:normal sinus rhythm.   IV site WDL:  forearm left, condition patent and no redness with a transparent dsg that's clean dry and intact.  Allergies:   Allergies  Allergen Reactions  . Crab [Shellfish Allergy] Swelling  . Erythromycin Other (See Comments)    convulsion     Past Medical History:  Diagnosis Date  . Hypertension     History:  obtained from chart review.  Pt orientation to unit, room and routine. Information packet given to patient/family and safety video watched.  Admission INP armband ID verified with patient/family, and in place. SR up x 2, fall risk assessment complete with Patient and family verbalizing understanding of risks associated with falls. Pt verbalizes an understanding of how to use the call bell and to call for help before getting out of bed.  Skin, clean-dry- intact without evidence of bruising, or skin tears.   No evidence of skin break down noted on exam. no rashes, no ecchymoses, no petechiae, no nodules, no jaundice, no purpura, no wounds    Will cont to monitor and assist as needed.  Tresa Endo, RN 01/05/2019 6:29 PM

## 2019-01-05 NOTE — ED Notes (Signed)
Pt daughter Denman George [phone]

## 2019-01-05 NOTE — Progress Notes (Signed)
Pt placed on HFNC 9L per ABG results.

## 2019-01-05 NOTE — ED Notes (Signed)
Pt called for EKG, no response from the lobby.

## 2019-01-05 NOTE — ED Triage Notes (Signed)
Patient here from home brought in by daughter with complaints of COPD exacerbation and SOB  x1 month. States that she has a new diagnosis of COPD and wears 2L O2.

## 2019-01-05 NOTE — Progress Notes (Signed)
PROGRESS NOTE    Caroline Greer  SWF:[id number] DOB: [birth year] DOA: 01/05/2019 PCP: [name], MD    Brief Narrative:  63 year old female who presented with respiratory distress.  He does have significant past medical history for hypertension, depression, tobacco abuse and COPD.  He reported 3 days of worsening dyspnea, started after mowing the grass, his dyspnea has been associated with wheezing and coughing no chest pain or fevers.  His symptoms have been refractive to outpatient therapy for with bronchodilators.  On his initial physical examination his blood pressure 145/75, pulse rate 106, respirate 24, oxygen saturation 90% on 6 L per nasal cannula, his lungs had decreased breath sounds bilaterally and expiratory wheezing, heart S1-S2 present tachycardic, the abdomen was soft nontender, no lower extremity edema. Sodium 140, potassium 3.9, chloride 97, glucose 128, BUN 6, creatinine 0.7, white count 9.4, hemoglobin 15.9, hematocrit 51.0, platelets 287, SARS COVID-19 was negative.  Chest radiograph with significant hyperinflation, no infiltrates.  EKG 83 bpm, right axis deviation, normal intervals, sinus rhythm, no ST segment changes, negative T waves V1 through V4.  Patient was admitted to the hospital with a working diagnosis of acute hypoxic respiratory failure due to COPD exacerbation.   Assessment & Plan:   Principal Problem:   COPD with acute exacerbation ([place]) Active Problems:   Acute respiratory failure with hypoxia ([facility])   Essential hypertension   Encounter for screening for HIV   COPD exacerbation ([place])  1. Acute hypoxic respiratory failure due to COPD exacerbation. Patient continue to be dyspneic, will continue with aggressive bronchodilator therapy with albuterol/ ipratropium/ inhaled corticosteroids and systemic steroids with methylprednisolone. Continue oxymetry monitoring and supplemental 02 per [place], to target oxygen saturation greater than  92%. Will dc antibiotic therapy,  no infiltrate on chest film. Taper systemic steroids.   2. HTN. Continue with blood pressure monitoring. Continue blood pressure control with amlodiopine.   3. Depression/ anxiety. No agitation or confusion. Continue as needed alprazolam.   4. Tobacco abuse. Smoking cessation counseling. Continue with nicotine patch.   5. Constipation, will add dulcolax.    DVT prophylaxis: enoxaparin   Code Status:  Full  Family Communication: no family at the bedside  Disposition Plan/ discharge barriers: pending clinical improvement.  There is no height or weight on file to calculate BMI. Malnutrition Type:      Malnutrition Characteristics:      Nutrition Interventions:     RN Pressure Injury Documentation:     Consultants:     Procedures:     Antimicrobials:       Subjective: Feeling better but continue to have dyspnea, not back to her baseline, positive constipation. No nausea or vomiting.   Objective: Vitals:   01/05/19 0930 01/05/19 1000 01/05/19 1030 01/05/19 1100  BP: 125/62 133/68 133/72 (!) 148/87  Pulse: 85 83 80 82  Resp: (!) 22 (!) 23 19 20   SpO2: 95% 95% 90% 92%   No intake or output data in the 24 hours ending 01/05/19 1350 There were no vitals filed for this visit.  Examination:   General: deconditioned  Neurology: Awake and alert, non focal  E ENT: mild pallor, no icterus, oral mucosa moist Cardiovascular: No JVD. S1-S2 present, rhythmic, no gallops, rubs, or murmurs. No lower extremity edema. Pulmonary: positive breath sounds bilaterally, faint wheezing, rhonchi or rales. Gastrointestinal. Abdomen with no organomegaly, non tender, no rebound or guarding. Positive distention.  Skin. No rashes Musculoskeletal: no joint deformities     Data Reviewed: I have personally reviewed  following labs and imaging studies  CBC: Recent Labs  Lab 01/05/19 0219 01/05/19 0225 01/05/19 0632  WBC 9.4  --   --   NEUTROABS 5.6  --   --   HGB 15.9*  18.4* 17.3*  HCT 51.0* 54.0* 51.0*  MCV 88.1  --   --   PLT 287  --   --    Basic Metabolic Panel: Recent Labs  Lab 01/05/19 0225 01/05/19 0632  NA 140 141  K 3.9 3.9  CL 97*  --   GLUCOSE 128*  --   BUN 6*  --   CREATININE 0.70  --    GFR: CrCl cannot be calculated (Unknown ideal weight.). Liver Function Tests: No results for input(s): AST, ALT, ALKPHOS, BILITOT, PROT, ALBUMIN in the last 168 hours. No results for input(s): LIPASE, AMYLASE in the last 168 hours. No results for input(s): AMMONIA in the last 168 hours. Coagulation Profile: No results for input(s): INR, PROTIME in the last 168 hours. Cardiac Enzymes: No results for input(s): CKTOTAL, CKMB, CKMBINDEX, TROPONINI in the last 168 hours. BNP (last 3 results) No results for input(s): PROBNP in the last 8760 hours. HbA1C: No results for input(s): HGBA1C in the last 72 hours. CBG: No results for input(s): GLUCAP in the last 168 hours. Lipid Profile: No results for input(s): CHOL, HDL, LDLCALC, TRIG, CHOLHDL, LDLDIRECT in the last 72 hours. Thyroid Function Tests: No results for input(s): TSH, T4TOTAL, FREET4, T3FREE, THYROIDAB in the last 72 hours. Anemia Panel: No results for input(s): VITAMINB12, FOLATE, FERRITIN, TIBC, IRON, RETICCTPCT in the last 72 hours.    Radiology Studies: I have reviewed all of the imaging during this hospital visit personally     Scheduled Meds:  budesonide (PULMICORT) nebulizer solution  2 mg Nebulization Q6H   enoxaparin (LOVENOX) injection  40 mg Subcutaneous Daily   ipratropium  0.5 mg Nebulization Q6H   levalbuterol  0.63 mg Nebulization Q6H   methylPREDNISolone (SOLU-MEDROL) injection  60 mg Intravenous Q8H   Continuous Infusions:  cefTRIAXone (ROCEPHIN)  IV Stopped (01/05/19 0800)     LOS: 0 days        Sharniece Gibbon Gerome Apley, MD

## 2019-01-05 NOTE — ED Triage Notes (Signed)
Patient with shortness of breath and resp distress for the last few days.  Got worse on Tuesday after mowing lawn.  Patient wears 2L all the time,  Patient with 65% on 2L.  No chest pain at this time.

## 2019-01-05 NOTE — Progress Notes (Signed)
Patient refused BIPAP and ABG due at 0900.

## 2019-01-05 NOTE — Evaluation (Signed)
Physical Therapy Evaluation Patient Details Name: Caroline Greer MRN: [id number] DOB: 04/05/1956 Today's Date: 01/05/2019   History of Present Illness  Pt is a 63 y/o female admitted secondary to COPD exacerbation. PMH includes HTN and COPD. PMH includes HTN and COPD.   Clinical Impression  Pt admitted secondary to problem above with deficits below. Pt limited this session secondary to decreased oxygen sats. Sats decreasing to 85% on 5L during short distance ambulation within the room. Required seated rest and pursed lip breathing to return to 91-92% on 5L. Required supervision for mobility tasks. Anticipate pt will progress well. Will continue to follow acutely to maximize functional mobility independence and safety.     Follow Up Recommendations No PT follow up    Equipment Recommendations  None recommended by PT    Recommendations for Other Services       Precautions / Restrictions Precautions Precautions: Other (comment) Precaution Comments: Watch O2 sats Restrictions Weight Bearing Restrictions: No      Mobility  Bed Mobility Overal bed mobility: Modified Independent                Transfers Overall transfer level: Needs assistance Equipment used: None Transfers: Sit to/from Stand Sit to Stand: Supervision         General transfer comment: Supervision for safety and line management.   Ambulation/Gait Ambulation/Gait assistance: Supervision Gait Distance (Feet): 15 Feet Assistive device: None Gait Pattern/deviations: Step-through pattern;Decreased stride length Gait velocity: Decreased   General Gait Details: Short distance ambulation within the ED room. Oxygen sats decreasing to 85% on 5L and required seated rest to return to 91-92% on 5L. Cues for pursed lip breathing. No excessive SOB noted, however.   Stairs            Wheelchair Mobility    Modified Rankin (Stroke Patients Only)       Balance Overall balance assessment: Needs  assistance Sitting-balance support: No upper extremity supported;Feet supported Sitting balance-Leahy Scale: Good     Standing balance support: No upper extremity supported;During functional activity Standing balance-Leahy Scale: Good                               Pertinent Vitals/Pain Pain Assessment: No/denies pain    Home Living Family/patient expects to be discharged to:: Private residence Living Arrangements: Alone Available Help at Discharge: Family;Available PRN/intermittently Type of Home: House Home Access: Stairs to enter Entrance Stairs-Rails: Right Entrance Stairs-Number of Steps: 4 Home Layout: One level Home Equipment: Walker - 2 wheels;Shower seat      Prior Function Level of Independence: Independent               Hand Dominance        Extremity/Trunk Assessment   Upper Extremity Assessment Upper Extremity Assessment: Overall WFL for tasks assessed    Lower Extremity Assessment Lower Extremity Assessment: Overall WFL for tasks assessed    Cervical / Trunk Assessment Cervical / Trunk Assessment: Normal  Communication   Communication: No difficulties  Cognition Arousal/Alertness: Awake/alert Behavior During Therapy: WFL for tasks assessed/performed Overall Cognitive Status: Within Functional Limits for tasks assessed                                        General Comments      Exercises     Assessment/Plan    PT Assessment  Patient needs continued PT services  PT Problem List Cardiopulmonary status limiting activity;Decreased mobility;Decreased activity tolerance       PT Treatment Interventions Gait training;Stair training;Functional mobility training;Therapeutic activities;Patient/family education;Therapeutic exercise    PT Goals (Current goals can be found in the Care Plan section)  Acute Rehab PT Goals Patient Stated Goal: to go back to work  PT Goal Formulation: With patient Time For Goal  Achievement: 01/19/19 Potential to Achieve Goals: Good    Frequency Min 3X/week   Barriers to discharge Decreased caregiver support      Co-evaluation               AM-PAC PT "6 Clicks" Mobility  Outcome Measure Help needed turning from your back to your side while in a flat bed without using bedrails?: None Help needed moving from lying on your back to sitting on the side of a flat bed without using bedrails?: None Help needed moving to and from a bed to a chair (including a wheelchair)?: None Help needed standing up from a chair using your arms (e.g., wheelchair or bedside chair)?: None Help needed to walk in hospital room?: None Help needed climbing 3-5 steps with a railing? : A Little 6 Click Score: 23    End of Session Equipment Utilized During Treatment: Oxygen Activity Tolerance: Treatment limited secondary to medical complications (Comment)(decreased oxygen sats) Patient left: in bed;with call bell/phone within reach Nurse Communication: Mobility status;Other (comment)(oxygen sats) PT Visit Diagnosis: Difficulty in walking, not elsewhere classified (R26.2)    Time: 5009-3818 PT Time Calculation (min) (ACUTE ONLY): 20 min   Charges:   PT Evaluation $PT Eval Low Complexity: [place], PT, DPT  Acute Rehabilitation Services  Pager: [phone] Office: [phone]   Rudean Hitt 01/05/2019, 3:30 PM

## 2019-01-05 NOTE — ED Notes (Signed)
ED TO INPATIENT HANDOFF REPORT  ED Nurse Name and Phone #: Maudie Mercury [phone]  S Name/Age/Gender Caroline Greer 63 y.o. female Room/Bed: H011C/H011C  Code Status   Code Status: Full Code  Home/SNF/Other Home Patient oriented to: self, place, time and situation Is this baseline? Yes   Triage Complete: Triage complete  Chief Complaint shob  Triage Note Patient with shortness of breath and resp distress for the last few days.  Got worse on Tuesday after mowing lawn.  Patient wears 2L all the time,  Patient with 65% on 2L.  No chest pain at this time.     Allergies Allergies  Allergen Reactions  . Crab [Shellfish Allergy] Swelling  . Erythromycin Other (See Comments)    convulsion    Level of Care/Admitting Diagnosis ED Disposition    ED Disposition Condition Comment   Admit  Hospital Area: [facility] [[id number]]  Level of Care: Telemetry Medical [104]  Covid Evaluation: Confirmed COVID Negative  Diagnosis: COPD exacerbation [facility]) [[id number]]  Admitting Physician: Tawni Millers [[id number]]  Attending Physician: Tawni Millers [[id number]]  Estimated length of stay: 3 - 4 days  Certification:: I certify this patient will need inpatient services for at least 2 midnights  PT Class (Do Not Modify): Inpatient [101]  PT Acc Code (Do Not Modify): Private [1]       B Medical/Surgery History Past Medical History:  Diagnosis Date  . Hypertension    Past Surgical History:  Procedure Laterality Date  . LAPAROTOMY  1974     A IV Location/Drains/Wounds Patient Lines/Drains/Airways Status   Active Line/Drains/Airways    Name:   Placement date:   Placement time:   Site:   Days:   Peripheral IV 01/05/19 Left Forearm   01/05/19    0222    Forearm   less than 1          Intake/Output Last 24 hours No intake or output data in the 24 hours ending 01/05/19 1521  Labs/Imaging Results for orders placed or performed during the hospital encounter of  01/05/19 (from the past 48 hour(s))  CBC with Differential/Platelet     Status: Abnormal   Collection Time: 01/05/19  2:19 AM  Result Value Ref Range   WBC 9.4 4.0 - 10.5 K/uL   RBC 5.79 (H) 3.87 - 5.11 MIL/uL   Hemoglobin 15.9 (H) 12.0 - 15.0 g/dL   HCT 51.0 (H) 36.0 - 46.0 %   MCV 88.1 80.0 - 100.0 fL   MCH 27.5 26.0 - 34.0 pg   MCHC 31.2 30.0 - 36.0 g/dL   RDW 18.1 (H) 11.5 - 15.5 %   Platelets 287 150 - 400 K/uL   nRBC 0.0 0.0 - 0.2 %   Neutrophils Relative % 60 %   Neutro Abs 5.6 1.7 - 7.7 K/uL   Lymphocytes Relative 26 %   Lymphs Abs 2.5 0.7 - 4.0 K/uL   Monocytes Relative 7 %   Monocytes Absolute 0.6 0.1 - 1.0 K/uL   Eosinophils Relative 6 %   Eosinophils Absolute 0.6 (H) 0.0 - 0.5 K/uL   Basophils Relative 1 %   Basophils Absolute 0.1 0.0 - 0.1 K/uL   Immature Granulocytes 0 %   Abs Immature Granulocytes 0.02 0.00 - 0.07 K/uL    Comment: Performed at [facility], [address], [place], [place] [address]  Troponin I (High Sensitivity)     Status: None   Collection Time: 01/05/19  2:19 AM  Result Value  Ref Range   Troponin I (High Sensitivity) 12 <18 ng/L    Comment: (NOTE) Elevated high sensitivity troponin I (hsTnI) values and significant  changes across serial measurements may suggest ACS but many other  chronic and acute conditions are known to elevate hsTnI results.  Refer to the "Links" section for chest pain algorithms and additional  guidance. Performed at [facility], [place] [address], [place], [place] [address]   Brain natriuretic peptide     Status: Abnormal   Collection Time: 01/05/19  2:19 AM  Result Value Ref Range   B Natriuretic Peptide 161.4 (H) 0.0 - 100.0 pg/mL    Comment: Performed at [facility] [address], [place], [place] [address]  I-stat chem 8, ED (not at [facility] or [facility])     Status: Abnormal   Collection Time: 01/05/19  2:25 AM  Result Value Ref Range   Sodium 140 135 - 145 mmol/L   Potassium 3.9 3.5 - 5.1 mmol/L    Chloride 97 (L) 98 - 111 mmol/L   BUN 6 (L) 8 - 23 mg/dL   Creatinine, Ser 0.70 0.44 - 1.00 mg/dL   Glucose, Bld 128 (H) 70 - 99 mg/dL   Calcium, Ion 1.17 1.15 - 1.40 mmol/L   TCO2 31 22 - 32 mmol/L   Hemoglobin 18.4 (H) 12.0 - 15.0 g/dL   HCT 54.0 (H) 36.0 - 46.0 %  SARS Coronavirus 2 [facility] order, Performed in [facility] hospital lab) Nasopharyngeal Nasopharyngeal Swab     Status: None   Collection Time: 01/05/19  2:27 AM   Specimen: Nasopharyngeal Swab  Result Value Ref Range   SARS Coronavirus 2 NEGATIVE NEGATIVE    Comment: (NOTE) If result is NEGATIVE SARS-CoV-2 target nucleic acids are NOT DETECTED. The SARS-CoV-2 RNA is generally detectable in upper and lower  respiratory specimens during the acute phase of infection. The lowest  concentration of SARS-CoV-2 viral copies this assay can detect is 250  copies / mL. A negative result does not preclude SARS-CoV-2 infection  and should not be used as the sole basis for treatment or other  patient management decisions.  A negative result may occur with  improper specimen collection / handling, submission of specimen other  than nasopharyngeal swab, presence of viral mutation(s) within the  areas targeted by this assay, and inadequate number of viral copies  (<250 copies / mL). A negative result must be combined with clinical  observations, patient history, and epidemiological information. If result is POSITIVE SARS-CoV-2 target nucleic acids are DETECTED. The SARS-CoV-2 RNA is generally detectable in upper and lower  respiratory specimens dur ing the acute phase of infection.  Positive  results are indicative of active infection with SARS-CoV-2.  Clinical  correlation with patient history and other diagnostic information is  necessary to determine patient infection status.  Positive results do  not rule out bacterial infection or co-infection with other viruses. If result is PRESUMPTIVE POSTIVE SARS-CoV-2 nucleic acids  MAY BE PRESENT.   A presumptive positive result was obtained on the submitted specimen  and confirmed on repeat testing.  While 2019 novel coronavirus  (SARS-CoV-2) nucleic acids may be present in the submitted sample  additional confirmatory testing may be necessary for epidemiological  and / or clinical management purposes  to differentiate between  SARS-CoV-2 and other Sarbecovirus currently known to infect humans.  If clinically indicated additional testing with an alternate test  methodology [phone]) is advised. The SARS-CoV-2 RNA is generally  detectable in upper and lower respiratory sp ecimens during the acute  phase of infection. The expected result is Negative. Fact Sheet for Patients:  StrictlyIdeas.no Fact Sheet for Healthcare Providers: BankingDealers.co.za This test is not yet approved or cleared by the [place] FDA and has been authorized for detection and/or diagnosis of SARS-CoV-2 by FDA under an Emergency Use Authorization (EUA).  This EUA will remain in effect (meaning this test can be used) for the duration of the COVID-19 declaration under Section 564(b)(1) of the Act, 21 U.S.C. section 360bbb-3(b)(1), unless the authorization is terminated or revoked sooner. Performed at [facility], [place] [address] [place], [place] [address]   Troponin I (High Sensitivity)     Status: None   Collection Time: 01/05/19  4:15 AM  Result Value Ref Range   Troponin I (High Sensitivity) 11 <18 ng/L    Comment: (NOTE) Elevated high sensitivity troponin I (hsTnI) values and significant  changes across serial measurements may suggest ACS but many other  chronic and acute conditions are known to elevate hsTnI results.  Refer to the "Links" section for chest pain algorithms and additional  guidance. Performed at [facility], [place] [address] [place], [place] [address]   I-STAT 7, (LYTES, BLD GAS, ICA, H+H)     Status:  Abnormal   Collection Time: 01/05/19  6:32 AM  Result Value Ref Range   pH, Arterial 7.312 (L) 7.350 - 7.450   pCO2 arterial 62.2 (H) 32.0 - 48.0 mmHg   pO2, Arterial 57.0 (L) 83.0 - 108.0 mmHg   Bicarbonate 31.5 (H) 20.0 - 28.0 mmol/L   TCO2 33 (H) 22 - 32 mmol/L   O2 Saturation 86.0 %   Acid-Base Excess 3.0 (H) 0.0 - 2.0 mmol/L   Sodium 141 135 - 145 mmol/L   Potassium 3.9 3.5 - 5.1 mmol/L   Calcium, Ion 1.24 1.15 - 1.40 mmol/L   HCT 51.0 (H) 36.0 - 46.0 %   Hemoglobin 17.3 (H) 12.0 - 15.0 g/dL   Patient temperature HIDE    Collection site RADIAL, ALLEN'S TEST ACCEPTABLE    Drawn by RT    Sample type ARTERIAL    Dg Chest 2 View  Result Date: 01/05/2019 CLINICAL DATA:  Shortness of breath EXAM: CHEST - 2 VIEW COMPARISON:  CTA chest dated 09/30/2018 FINDINGS: Lungs are clear.  No pleural effusion or pneumothorax. The heart is normal in size. Visualized osseous structures are within normal limits. IMPRESSION: Normal chest radiographs. Electronically Signed   By: Julian Hy M.D.   On: 01/05/2019 00:54   Dg Chest Portable 1 View  Result Date: 01/05/2019 CLINICAL DATA:  63 year old female with respiratory distress. EXAM: PORTABLE CHEST 1 VIEW COMPARISON:  Chest radiograph dated 01/05/2019 and CT dated 09/30/2018 FINDINGS: There is no focal consolidation, pleural effusion, or pneumothorax. There is mild cardiomegaly. There is background of mild centrilobular emphysema. Atherosclerotic calcification the aorta. No acute osseous pathology. IMPRESSION: 1. No acute cardiopulmonary process. 2. Mild cardiomegaly. Electronically Signed   By: Anner Crete M.D.   On: 01/05/2019 02:37    Pending Labs Unresulted Labs (From admission, onward)    Start     Ordered   01/05/19 0615  Blood gas, arterial  Once,   R     01/05/19 0614   01/05/19 0609  HIV antibody  Once,   STAT     01/05/19 0614          Vitals/Pain Today's Vitals   01/05/19 1300 01/05/19  1330 01/05/19 1400 01/05/19 1410   BP: 126/73 131/84 (!) 141/88   Pulse: 93 93 91   Resp: (!) 22 (!) 24 (!) 23   SpO2: 91% 91% 92% 91%  PainSc:        Isolation Precautions No active isolations  Medications Medications  enoxaparin (LOVENOX) injection 40 mg (has no administration in time range)  cefTRIAXone (ROCEPHIN) 1 g in sodium chloride 0.9 % 100 mL IVPB (0 g Intravenous Stopped 01/05/19 0800)  methylPREDNISolone sodium succinate (SOLU-MEDROL) 125 mg/2 mL injection 60 mg (60 mg Intravenous Given 01/05/19 1140)  albuterol (PROVENTIL) (2.5 MG/3ML) 0.083% nebulizer solution 2.5 mg (has no administration in time range)  hydrALAZINE (APRESOLINE) injection 5 mg (has no administration in time range)  budesonide (PULMICORT) nebulizer solution 2 mg (has no administration in time range)  ipratropium-albuterol (DUONEB) 0.5-2.5 (3) MG/3ML nebulizer solution 3 mL (has no administration in time range)  albuterol (VENTOLIN HFA) 108 (90 Base) MCG/ACT inhaler 10 puff (10 puffs Inhalation Given 01/05/19 0231)  ipratropium (ATROVENT HFA) inhaler 2 puff (2 puffs Inhalation Given 01/05/19 0455)  methylPREDNISolone sodium succinate (SOLU-MEDROL) 125 mg/2 mL injection 125 mg (125 mg Intravenous Given 01/05/19 0232)  magnesium sulfate IVPB 2 g 50 mL (0 g Intravenous Stopped 01/05/19 0336)  loratadine (CLARITIN) tablet 10 mg (10 mg Oral Given 01/05/19 0233)  AeroChamber Plus Flo-Vu Large MISC (  Given 01/05/19 0446)    Mobility walks Low fall risk   Focused Assessments Pulmonary Assessment Handoff:  Lung sounds: Bilateral Breath Sounds: Diminished L Breath Sounds: Diminished R Breath Sounds: Diminished O2 Device: High Flow Nasal Cannula O2 Flow Rate (L/min): 5 L/min      R Recommendations: See Admitting Provider Note  Report given to:   Additional Notes:

## 2019-01-06 DIAGNOSIS — K5900 Constipation, unspecified: Secondary | ICD-10-CM

## 2019-01-06 LAB — BASIC METABOLIC PANEL
Anion gap: 8 (ref 5–15)
BUN: 10 mg/dL (ref 8–23)
CO2: 30 mmol/L (ref 22–32)
Calcium: 9 mg/dL (ref 8.9–10.3)
Chloride: 102 mmol/L (ref 98–111)
Creatinine, Ser: 0.6 mg/dL (ref 0.44–1.00)
GFR calc Af Amer: >60 mL/min (ref >60)
GFR calc non Af Amer: >60 mL/min (ref >60)
Glucose, Bld: 102 mg/dL — ABNORMAL HIGH (ref 70–99)
Potassium: 4.5 mmol/L (ref 3.5–5.1)
Sodium: 140 mmol/L (ref 135–145)

## 2019-01-06 LAB — HIV ANTIBODY (ROUTINE TESTING W REFLEX): HIV Screen 4th Generation wRfx: NONREACTIVE

## 2019-01-06 MED ORDER — ADULT MULTIVITAMIN W/MINERALS CH
1.0000 | ORAL_TABLET | Freq: Every day | ORAL | Status: DC
  Administered 2019-01-06 – 2019-01-07 (×2): 1 via ORAL
  Filled 2019-01-06 (×2): qty 1

## 2019-01-06 MED ORDER — BISACODYL 5 MG PO TBEC
5.0000 mg | DELAYED_RELEASE_TABLET | Freq: Once | ORAL | Status: AC
  Administered 2019-01-06: 5 mg via ORAL
  Filled 2019-01-06: qty 1

## 2019-01-06 MED ORDER — POLYETHYLENE GLYCOL 3350 17 G PO PACK
17.0000 g | PACK | Freq: Two times a day (BID) | ORAL | Status: DC
  Administered 2019-01-06 (×2): 17 g via ORAL
  Filled 2019-01-06 (×3): qty 1

## 2019-01-06 MED ORDER — ENSURE ENLIVE PO LIQD
237.0000 mL | Freq: Two times a day (BID) | ORAL | Status: DC
  Administered 2019-01-06 – 2019-01-07 (×2): 237 mL via ORAL
  Filled 2019-01-06: qty 237

## 2019-01-06 MED ORDER — FLEET ENEMA 7-19 GM/118ML RE ENEM
1.0000 | ENEMA | RECTAL | Status: AC
  Administered 2019-01-06: 14:00:00 1 via RECTAL
  Filled 2019-01-06 (×2): qty 1

## 2019-01-06 NOTE — Progress Notes (Signed)
PROGRESS NOTE    Caroline Greer  FAO:[id number] DOB: [birth year] DOA: 01/05/2019 PCP: [name], MD    Brief Narrative:  63 year old female who presented with respiratory distress.  He does have significant past medical history for hypertension, depression, tobacco abuse and COPD.  He reported 3 days of worsening dyspnea, started after mowing the grass, his dyspnea has been associated with wheezing and coughing no chest pain or fevers.  His symptoms have been refractive to outpatient therapy for with bronchodilators.  On his initial physical examination his blood pressure 145/75, pulse rate 106, respirate 24, oxygen saturation 90% on 6 L per nasal cannula, his lungs had decreased breath sounds bilaterally and expiratory wheezing, heart S1-S2 present tachycardic, the abdomen was soft nontender, no lower extremity edema. Sodium 140, potassium 3.9, chloride 97, glucose 128, BUN 6, creatinine 0.7, white count 9.4, hemoglobin 15.9, hematocrit 51.0, platelets 287, SARS COVID-19 was negative.  Chest radiograph with significant hyperinflation, no infiltrates.  EKG 83 bpm, right axis deviation, normal intervals, sinus rhythm, no ST segment changes, negative T waves V1 through V4.  Patient was admitted to the hospital with a working diagnosis of acute hypoxic respiratory failure due to COPD exacerbation.   Assessment & Plan:   Principal Problem:   COPD with acute exacerbation ([place]) Active Problems:   Acute respiratory failure with hypoxia (HCC)   Essential hypertension   Encounter for screening for HIV   COPD exacerbation ([place])     1. Acute hypoxic respiratory failure due to COPD exacerbation. Dyspnea has improved but not back to baseline, will continue with aggressive bronchodilator therapy with albuterol/ ipratropium/ inhaled corticosteroids. Discontinue systemic steroids for now. Oxymetry on 5 LPM at 96%, at home uses 2 LPM.   2. HTN. Blood pressure 155/97 mmHg, will continue with amlodipine  for blood pressure control.   3. Depression/ anxiety. As needed alprazolam with good toleration.   4. Tobacco abuse. Continue with moking cessation/ nicotine patch.   5. Constipation. Persistent constipation, will add enema, plus miralax and repeat dose of dulcolax.    DVT prophylaxis: enoxaparin   Code Status:  Full  Family Communication: no family at the bedside  Disposition Plan/ discharge barriers: pending clinical improvement.  There is no height or weight on file to calculate BMI. Malnutrition Type:  Nutrition Problem: Inadequate oral intake Etiology: constipation(abdominal bloating)   Malnutrition Characteristics:  Signs/Symptoms: per patient/family report, energy intake < 75% for > 7 days   Nutrition Interventions:  Interventions: Ensure Enlive (each supplement provides 350kcal and 20 grams of protein), MVI  RN Pressure Injury Documentation:     Consultants:     Procedures:     Antimicrobials:       Subjective: Patient's dyspnea continue to improve but not back to baseline, at home on 2 lpm, no nausea or vomiting, continue to have severe constipation, no nausea or vomiting.   Objective: Vitals:   01/05/19 2157 01/06/19 0532 01/06/19 0810 01/06/19 0900  BP: (!) 147/81 (!) 155/97  128/77  Pulse: 91 96    Resp:      Temp: 99.1 F (37.3 C) 98.8 F (37.1 C)    SpO2: 93% 97% 96%     Intake/Output Summary (Last 24 hours) at 01/06/2019 1240 Last data filed at 01/05/2019 1800 Gross per 24 hour  Intake 100 ml  Output -  Net 100 ml   There were no vitals filed for this visit.  Examination:   General: deconditioned  Neurology: Awake and alert, non focal  E  ENT: mild pallor, no icterus, oral mucosa moist Cardiovascular: No JVD. S1-S2 present, rhythmic, no gallops, rubs, or murmurs. No lower extremity edema. Pulmonary: positive breath sounds bilaterally, decreased air movement, no siginificant wheezing, with no rhonchi or rales.  Gastrointestinal. Abdomen mild distended with no organomegaly, non tender, no rebound or guarding Skin. No rashes Musculoskeletal: no joint deformities     Data Reviewed: I have personally reviewed following labs and imaging studies  CBC: Recent Labs  Lab 01/05/19 0219 01/05/19 0225 01/05/19 0632  WBC 9.4  --   --   NEUTROABS 5.6  --   --   HGB 15.9* 18.4* 17.3*  HCT 51.0* 54.0* 51.0*  MCV 88.1  --   --   PLT 287  --   --    Basic Metabolic Panel: Recent Labs  Lab 01/05/19 0225 01/05/19 0632 01/06/19 0317  NA 140 141 140  K 3.9 3.9 4.5  CL 97*  --  102  CO2  --   --  30  GLUCOSE 128*  --  102*  BUN 6*  --  10  CREATININE 0.70  --  0.60  CALCIUM  --   --  9.0   GFR: CrCl cannot be calculated (Unknown ideal weight.). Liver Function Tests: No results for input(s): AST, ALT, ALKPHOS, BILITOT, PROT, ALBUMIN in the last 168 hours. No results for input(s): LIPASE, AMYLASE in the last 168 hours. No results for input(s): AMMONIA in the last 168 hours. Coagulation Profile: No results for input(s): INR, PROTIME in the last 168 hours. Cardiac Enzymes: No results for input(s): CKTOTAL, CKMB, CKMBINDEX, TROPONINI in the last 168 hours. BNP (last 3 results) No results for input(s): PROBNP in the last 8760 hours. HbA1C: No results for input(s): HGBA1C in the last 72 hours. CBG: No results for input(s): GLUCAP in the last 168 hours. Lipid Profile: No results for input(s): CHOL, HDL, LDLCALC, TRIG, CHOLHDL, LDLDIRECT in the last 72 hours. Thyroid Function Tests: No results for input(s): TSH, T4TOTAL, FREET4, T3FREE, THYROIDAB in the last 72 hours. Anemia Panel: No results for input(s): VITAMINB12, FOLATE, FERRITIN, TIBC, IRON, RETICCTPCT in the last 72 hours.    Radiology Studies: I have reviewed all of the imaging during this hospital visit personally     Scheduled Meds: . amLODipine  2.5 mg Oral Daily  . aspirin EC  81 mg Oral Daily  . budesonide (PULMICORT)  nebulizer solution  2 mg Nebulization BID  . enoxaparin (LOVENOX) injection  40 mg Subcutaneous Daily  . feeding supplement (ENSURE ENLIVE)  237 mL Oral BID BM  . ipratropium-albuterol  3 mL Nebulization TID  . loratadine  10 mg Oral Daily  . methylPREDNISolone (SOLU-MEDROL) injection  40 mg Intravenous Daily  . multivitamin with minerals  1 tablet Oral Daily  . nicotine  14 mg Transdermal Daily  . vitamin B-12  1,000 mcg Oral Daily   Continuous Infusions:   LOS: 1 day        Caroline Meggison Gerome Apley, MD

## 2019-01-06 NOTE — Evaluation (Signed)
Occupational Therapy Evaluation Patient Details Name: Caroline Greer MRN: [id number] DOB: [birth year] Today's Date: 01/06/2019    History of Present Illness Pt is a 63 y/o female admitted secondary to COPD exacerbation. PMH includes HTN and COPD. PMH includes HTN and COPD.    Clinical Impression   Pt admitted with COPD . Pt currently with functional limitations due to the deficits listed below (see OT Problem List). Patient lives alone and typically is very active and uses o2 as needed. Pt was educated on pacing self with ADLS due to o2. Pt at this time educated about using shower seat in shower due to o2 status and energy conversations with ADL participation.       Follow Up Recommendations  No OT follow up;Supervision - Intermittent    Equipment Recommendations       Recommendations for Other Services       Precautions / Restrictions Precautions Precautions: Other (comment) Precaution Comments: Watch O2 sats Restrictions Weight Bearing Restrictions: No      Mobility Bed Mobility Overal bed mobility: Modified Independent                Transfers Overall transfer level: Needs assistance Equipment used: None Transfers: Sit to/from Stand Sit to Stand: Supervision         General transfer comment: Supervision for safety and line management.     Balance Overall balance assessment: Needs assistance Sitting-balance support: No upper extremity supported;Feet supported Sitting balance-Leahy Scale: Good     Standing balance support: No upper extremity supported;During functional activity Standing balance-Leahy Scale: Good                             ADL either performed or assessed with clinical judgement   ADL Overall ADL's : Needs assistance/impaired Eating/Feeding: Independent;Sitting   Grooming: Wash/dry hands;Supervision/safety;Standing   Upper Body Bathing: Supervision/ safety;Cueing for safety;Sitting   Lower Body Bathing: Supervison/  safety;Sit to/from stand   Upper Body Dressing : Supervision/safety;Sitting   Lower Body Dressing: Supervision/safety;Sit to/from stand   Toilet Transfer: Supervision/safety   Toileting- Water quality scientist and Hygiene: Supervision/safety;Sit to/from stand   Tub/ Shower Transfer: Tub transfer;Min guard;Shower seat;Cueing for safety;Cueing for sequencing   Functional mobility during ADLs: Supervision/safety;Cueing for safety;Cueing for sequencing;Rolling walker       Vision Baseline Vision/History: Wears glasses Wears Glasses: Reading only Patient Visual Report: No change from baseline Vision Assessment?: No apparent visual deficits     Perception Perception Perception Tested?: No   Praxis Praxis Praxis tested?: Not tested    Pertinent Vitals/Pain Pain Assessment: No/denies pain     Hand Dominance Right   Extremity/Trunk Assessment Upper Extremity Assessment Upper Extremity Assessment: Overall WFL for tasks assessed   Lower Extremity Assessment Lower Extremity Assessment: Defer to PT evaluation   Cervical / Trunk Assessment Cervical / Trunk Assessment: Normal   Communication Communication Communication: No difficulties   Cognition Arousal/Alertness: Awake/alert Behavior During Therapy: WFL for tasks assessed/performed Overall Cognitive Status: Within Functional Limits for tasks assessed                                     General Comments       Exercises     Shoulder Instructions      Home Living Family/patient expects to be discharged to:: Private residence Living Arrangements: Alone Available Help at Discharge: Family;Available PRN/intermittently Type of Home: House  Home Access: Stairs to enter [employer] of Steps: 4 Entrance Stairs-Rails: Right Home Layout: One level     Bathroom Shower/Tub: Teacher, early years/pre: Standard Bathroom Accessibility: Yes How Accessible: Accessible via walker Home  Equipment: [place] - 2 wheels;Shower seat          Prior Functioning/Environment Level of Independence: Independent                 OT Problem List: Decreased strength;Decreased activity tolerance;Decreased safety awareness;Decreased knowledge of use of DME or AE      OT Treatment/Interventions:      OT Goals(Current goals can be found in the care plan section) Acute Rehab OT Goals Patient Stated Goal: to go back to work  OT Goal Formulation: With patient Time For Goal Achievement: 01/13/19 Potential to Achieve Goals: Good  OT Frequency:     Barriers to D/C:            Co-evaluation              AM-PAC OT "6 Clicks" Daily Activity     Outcome Measure Help from another person eating meals?: None Help from another person taking care of personal grooming?: None Help from another person toileting, which includes using toliet, bedpan, or urinal?: None Help from another person bathing (including washing, rinsing, drying)?: A Little Help from another person to put on and taking off regular upper body clothing?: None Help from another person to put on and taking off regular lower body clothing?: None 6 Click Score: 23   End of Session Equipment Utilized During Treatment: Gait belt;Oxygen Nurse Communication: Mobility status  Activity Tolerance: Patient tolerated treatment well Patient left: in bed;with call bell/phone within reach  OT Visit Diagnosis: Unsteadiness on feet (R26.81);Muscle weakness (generalized) (M62.81);Pain                Time: 1443-1501 OT Time Calculation (min): 18 min Charges:  OT General Charges $OT Visit: 1 Visit OT Evaluation $OT Eval Low Complexity: [place] OTR/L  Acute Rehab Services  [phone] office number [phone] pager number   Joeseph Amor 01/06/2019, 3:21 PM

## 2019-01-06 NOTE — TOC Initial Note (Addendum)
Transition of Care [facility]) - Initial/Assessment Note    Patient Details  Name: Caroline Greer MRN: [id number] Date of Birth: December 31, 1955  Transition of Care [facility]) CM/SW Contact:    Carles Collet, RN Phone Number: 01/06/2019, 8:10 AM  Clinical Narrative:          Consult for COPD. Patient from home alone, obs for COPD flare, smoker. Requiring O2. Patient reports no difficulties obtaining meds.  Patient has home oxygen through [employer]. She will have her daughter bring portable tank for use in transport home at DC. No other CM needs identified.   Coverage [employer] Medicare PCP     [name], MD     Expected Discharge Plan: Home/Self Care Barriers to Discharge: Continued Medical Work up   Patient Goals and CMS Choice        Expected Discharge Plan and Services Expected Discharge Plan: Home/Self Care                                              Prior Living Arrangements/Services                       Activities of Daily Living      Permission Sought/Granted                  Emotional Assessment              Admission diagnosis:  COPD exacerbation [facility]) [J44.1] Patient Active Problem List   Diagnosis Date Noted  . COPD with acute exacerbation ([place]) 01/05/2019  . Encounter for screening for HIV 01/05/2019  . COPD exacerbation ([place]) 01/05/2019  . Acute respiratory failure with hypoxia ([place]) 09/30/2018  . Essential hypertension 09/30/2018  . Tobacco dependence due to cigarettes 09/30/2018  . Depression with anxiety 09/30/2018   PCP:  [name], MD Pharmacy:   [facility] [place] - [address] [place] [address] Phone: [phone] Fax: [phone]     Social Determinants of Health (SDOH) Interventions    Readmission Risk Interventions No flowsheet data found.

## 2019-01-06 NOTE — Progress Notes (Signed)
Initial Nutrition Assessment  DOCUMENTATION CODES:   Not applicable  INTERVENTION:  -Ensure Enlive po BID, each supplement provides 350 kcal and 20 grams of protein -MVI  NUTRITION DIAGNOSIS:   Altered GI function related to constipation as evidenced by per patient/family report, energy intake < 75% for > 7 days.   GOAL:   Patient will meet greater than or equal to 90% of their needs   MONITOR:   PO intake, Labs, I & O's, Weight trends, Supplement acceptance  REASON FOR ASSESSMENT:   Consult Assessment of nutrition requirement/status  ASSESSMENT:  RD working remotely.  63 year old female with medical history significant of HTN, depression, admission for COPD exacerbation in May 2020 presented to ED for evaluation of respiratory distress. Patient states that has had SOB for a while, but it became worse 3 days ago while cutting grass and c/o wheezing and coughing. Patient admitted with acute respiratory failure due to COPD exacerbation.   Per chart review, patient reports feeling better, but continues to have dyspnea and complains of constipation. Patient noted to have hemorrhoids; laxative and stool softener given last night.   RD spoke to very delightful patient this morning via phone. She reports that it has been 2 weeks since her last good BM and complains of abdominal swelling this morning. Patient states that she feels her SOB is due to "being so stopped up" RD educated on adequate fluid and fiber intake to promote regularity. Per patient, she was offered an enema last night, but wanted to see if the stool softener would work first. Patient reports that she is considering asking RN for enema today.  Current wt 61.5 kg (135.3 lb) - taken during May admission; RD to order daily weights   Patient reports 137.2 lb at her doctors office a couple of weeks ago. She tells RD that she has always been "a bean pole." and stated that her grandmother had 13 children and never weighed  more 110 lbs.   Patient states that she has been trying to gain weight and when on "those steroids" she eats everything in site. Patient reports decreased appetite over the past week due to SOB, abdominal pain, and bloating.  Patient reports 100% of breakfast (Omelette with green peppers,mushrooms, spinach, orange sherbet, hot chocolate)  Patient recalls leafy greens, beef, pork, a lot of fish and shrimp, and potatoes. She reports enjoying fresh food and has a garden with tomatoes, squash, bell peppers, cucumbers. Patient reports keeping Ensure at home and drinking them occasionally; pt agreeable with receiving ONS BID to assist with calorie and protein needs.   Medications reviewed and include: Pulmicort, Duoneb, methylprednisolone,vit B-12  Labs: reviewed   NUTRITION - FOCUSED PHYSICAL EXAM: Unable to complete at this time   Diet Order:   Diet Order            Diet Heart Room service appropriate? Yes; Fluid consistency: Thin  Diet effective now              EDUCATION NEEDS:   Education needs have been addressed  Skin:  Skin Assessment: Reviewed RN Assessment  Last BM:  PTA  Height:   Ht Readings from Last 1 Encounters:  09/30/18 5' 10.75" (1.797 m)    Weight:   Wt Readings from Last 1 Encounters:  09/30/18 61.5 kg    Ideal Body Weight:  69.3 kg  BMI:  There is no height or weight on file to calculate BMI.  Estimated Nutritional Needs:   Kcal:  2072-1828  Protein:  93-103  Fluid:  >1.8L   Lajuan Lines, RD, LDN  After Hours/Weekend Pager: [phone]

## 2019-01-07 MED ORDER — ALBUTEROL SULFATE HFA 108 (90 BASE) MCG/ACT IN AERS: 2.0000 | INHALATION_SPRAY | Freq: Four times a day (QID) | RESPIRATORY_TRACT | 0 refills | Status: DC | PRN

## 2019-01-07 MED ORDER — BUDESONIDE-FORMOTEROL FUMARATE 160-4.5 MCG/ACT IN AERO: 2.0000 | INHALATION_SPRAY | Freq: Two times a day (BID) | RESPIRATORY_TRACT | 0 refills | Status: DC

## 2019-01-07 MED ORDER — POLYETHYLENE GLYCOL 3350 17 G PO PACK: 17.0000 g | PACK | Freq: Every day | ORAL | 0 refills | Status: DC

## 2019-01-07 NOTE — Progress Notes (Signed)
Caroline Greer to be D/C'd Home per MD order.  Discussed with the patient and all questions fully answered.  VSS, Skin clean, dry and intact without evidence of skin break down, no evidence of skin tears noted. IV catheter discontinued intact. Site without signs and symptoms of complications. Dressing and pressure applied.  An After Visit Summary was printed and given to the patient. Patient received prescription.  D/c education completed with patient/family including follow up instructions, medication list, d/c activities limitations if indicated, with other d/c instructions as indicated by MD - patient able to verbalize understanding, all questions fully answered.   Patient instructed to return to ED, call 911, or call MD for any changes in condition.   Patient escorted via [place], and D/C home via private auto.  Luci Bank 01/07/2019 12:19 PM

## 2019-01-07 NOTE — Discharge Summary (Signed)
Physician Discharge Summary  Iona Stay JKK:[id number] DOB: 12-Nov-1955 DOA: 01/05/2019  PCP: [name], MD  Admit date: 01/05/2019 Discharge date: 01/07/2019  Admitted From: Home  Disposition:  Home   Recommendations for Outpatient Follow-up and new medication changes:  1. Follow up with 01/08/19 with Dr. Mellody Drown.  2. Refilled albuterol and symbicort. 3. Continue smoking cessation.  4. Added daily miralax.   Home Health: no   Equipment/Devices: no    Discharge Condition: stable  CODE STATUS: full   Diet recommendation:  Heart healthy   Brief/Interim Summary: 63 year old female who presented with respiratory distress. She does have significant past medical history for hypertension, depression, tobacco abuse and COPD. She reported 3 days of worsening dyspnea, started after mowing the grass, her dyspnea has been associated with wheezing and coughing no chest pain or fevers. Her symptoms have been refractive to outpatient therapy for with bronchodilators. On her initial physical examination her blood pressure was 145/75, pulse rate 106, respirator rate 24, oxygen saturation 90% on 6 L per nasal cannula,her lungs had decreased breath sounds bilaterally and expiratory wheezing, heart S1-S2 present tachycardic, the abdomen was soft nontender, no lower extremity edema. Sodium 140, potassium 3.9, chloride 97, glucose 128, BUN 6, creatinine 0.7, white count 9.4, hemoglobin 15.9, hematocrit 51.0, platelets 287, SARS COVID-19 was negative. Chest radiograph with significant hyperinflation, no infiltrates. EKG 83 bpm, right axis deviation, normal intervals, sinus rhythm, no ST segment changes, negative T waves V1 through V4.  Patient was admitted to the hospital with a working diagnosis of acute on chronic hypoxic respiratory failure due to COPD exacerbation.  1.  Acute on chronic hypoxic respiratory failure due to COPD exacerbation.  Patient was admitted to the medical ward, she was placed  on remote telemetry monitor, received aggressive bronchodilator therapy along with systemic and inhaled corticosteroids with improvement of her symptoms.  Her oxygen requirements decreased, and at discharge her oxygen saturation is 93% on 3 L per nasal cannula.  Patient will continue taking bronchodilator therapy and inhaled corticosteroids.  Continue smoking cessation and supplemental oxygen per nasal cannula.  2.  Hypertension.  Her blood pressure been well controlled, continue amlodipine.  3.  Tobacco abuse.  Continue smoking cessation, nicotine patch.  4.  Constipation.  Patient required bowel regimen including fleer enema and oral dulcolax, MiraLAX will be added to her medical regimen.    Discharge Diagnoses:  Principal Problem:   COPD with acute exacerbation ([place]) Active Problems:   Acute respiratory failure with hypoxia ([place])   Essential hypertension   Encounter for screening for HIV   COPD exacerbation ([place])    Discharge Instructions   Allergies as of 01/07/2019      Reactions   Crab [shellfish Allergy] Swelling   Erythromycin Other (See Comments)   convulsion      Medication List    STOP taking these medications   buPROPion 150 MG 12 hr tablet Commonly known as: WELLBUTRIN SR     TAKE these medications   albuterol 108 (90 Base) MCG/ACT inhaler Commonly known as: VENTOLIN HFA Inhale 2 puffs into the lungs every 6 (six) hours as needed for wheezing or shortness of breath.   ALPRAZolam 0.5 MG tablet Commonly known as: XANAX Take 0.5 mg by mouth at bedtime as needed for sleep.   amLODipine 2.5 MG tablet Commonly known as: NORVASC Take 2.5 mg by mouth daily.   aspirin EC 81 MG tablet Take 81 mg by mouth daily.   benazepril 40 MG tablet Commonly known  as: LOTENSIN Take 40 mg by mouth daily.   budesonide-formoterol 160-4.5 MCG/ACT inhaler Commonly known as: Symbicort Inhale 2 puffs into the lungs 2 (two) times daily. What changed: when to take this    cetirizine 10 MG tablet Commonly known as: ZYRTEC Take 10 mg by mouth daily.   Flax Seed Oil 1000 MG Caps Take 1,000 mg by mouth daily.   nicotine 14 mg/24hr patch Commonly known as: NICODERM CQ - dosed in mg/24 hours Place 1 patch (14 mg total) onto the skin daily.   polyethylene glycol 17 g packet Commonly known as: MIRALAX / GLYCOLAX Take 17 g by mouth daily.   simethicone 125 MG chewable tablet Commonly known as: MYLICON Chew 704 mg by mouth every 6 (six) hours as needed for flatulence.   vitamin B-12 1000 MCG tablet Commonly known as: CYANOCOBALAMIN Take 1,000 mcg by mouth daily.      Follow-up Information    [name], MD Follow up.   Specialty: Internal Medicine Why: As scheduled 01/08/19.  Contact information: [address] [place] [id number] [phone]          Allergies  Allergen Reactions  . Crab [Shellfish Allergy] Swelling  . Erythromycin Other (See Comments)    convulsion    Consultations:     Procedures/Studies: Dg Chest 2 View  Result Date: 01/05/2019 CLINICAL DATA:  Shortness of breath EXAM: CHEST - 2 VIEW COMPARISON:  CTA chest dated 09/30/2018 FINDINGS: Lungs are clear.  No pleural effusion or pneumothorax. The heart is normal in size. Visualized osseous structures are within normal limits. IMPRESSION: Normal chest radiographs. Electronically Signed   By: Julian Hy M.D.   On: 01/05/2019 00:54   Dg Chest Portable 1 View  Result Date: 01/05/2019 CLINICAL DATA:  63 year old female with respiratory distress. EXAM: PORTABLE CHEST 1 VIEW COMPARISON:  Chest radiograph dated 01/05/2019 and CT dated 09/30/2018 FINDINGS: There is no focal consolidation, pleural effusion, or pneumothorax. There is mild cardiomegaly. There is background of mild centrilobular emphysema. Atherosclerotic calcification the aorta. No acute osseous pathology. IMPRESSION: 1. No acute cardiopulmonary process. 2. Mild cardiomegaly. Electronically Signed   By:  Anner Crete M.D.   On: 01/05/2019 02:37      Procedures:   Subjective: Patient is feeling better, dyspnea has improved, now back to her baseline, no chest pain, no nausea or vomiting, positive bowel movement.   Discharge Exam: Vitals:   01/07/19 0728 01/07/19 0855  BP:  (!) 164/97  Pulse:    Resp:    Temp:    SpO2: 93%    Vitals:   01/07/19 0658 01/07/19 0721 01/07/19 0728 01/07/19 0855  BP: (!) 163/97   (!) 164/97  Pulse: 84 81    Resp:  16    Temp:      TempSrc:      SpO2:  93% 93%     General: Not in pain or dyspnea  Neurology: Awake and alert, non focal  E ENT: no pallor, no icterus, oral mucosa moist Cardiovascular: No JVD. S1-S2 present, rhythmic, no gallops, rubs, or murmurs. No lower extremity edema. Pulmonary: positive breath sounds bilaterally, no wheezing, rhonchi or rales. Gastrointestinal. Abdomen with no organomegaly, non tender, no rebound or guarding Skin. No rashes Musculoskeletal: no joint deformities   The results of significant diagnostics from this hospitalization (including imaging, microbiology, ancillary and laboratory) are listed below for reference.     Microbiology: Recent Results (from the past 240 hour(s))  SARS Coronavirus 2 [facility] order, Performed in  [facility] hospital lab) Nasopharyngeal Nasopharyngeal Swab     Status: None   Collection Time: 01/05/19  2:27 AM   Specimen: Nasopharyngeal Swab  Result Value Ref Range Status   SARS Coronavirus 2 NEGATIVE NEGATIVE Final    Comment: (NOTE) If result is NEGATIVE SARS-CoV-2 target nucleic acids are NOT DETECTED. The SARS-CoV-2 RNA is generally detectable in upper and lower  respiratory specimens during the acute phase of infection. The lowest  concentration of SARS-CoV-2 viral copies this assay can detect is 250  copies / mL. A negative result does not preclude SARS-CoV-2 infection  and should not be used as the sole basis for treatment or other  patient management decisions.   A negative result may occur with  improper specimen collection / handling, submission of specimen other  than nasopharyngeal swab, presence of viral mutation(s) within the  areas targeted by this assay, and inadequate number of viral copies  (<250 copies / mL). A negative result must be combined with clinical  observations, patient history, and epidemiological information. If result is POSITIVE SARS-CoV-2 target nucleic acids are DETECTED. The SARS-CoV-2 RNA is generally detectable in upper and lower  respiratory specimens dur ing the acute phase of infection.  Positive  results are indicative of active infection with SARS-CoV-2.  Clinical  correlation with patient history and other diagnostic information is  necessary to determine patient infection status.  Positive results do  not rule out bacterial infection or co-infection with other viruses. If result is PRESUMPTIVE POSTIVE SARS-CoV-2 nucleic acids MAY BE PRESENT.   A presumptive positive result was obtained on the submitted specimen  and confirmed on repeat testing.  While 2019 novel coronavirus  (SARS-CoV-2) nucleic acids may be present in the submitted sample  additional confirmatory testing may be necessary for epidemiological  and / or clinical management purposes  to differentiate between  SARS-CoV-2 and other Sarbecovirus currently known to infect humans.  If clinically indicated additional testing with an alternate test  methodology [phone]) is advised. The SARS-CoV-2 RNA is generally  detectable in upper and lower respiratory sp ecimens during the acute  phase of infection. The expected result is Negative. Fact Sheet for Patients:  StrictlyIdeas.no Fact Sheet for Healthcare Providers: BankingDealers.co.za This test is not yet approved or cleared by the [place] FDA and has been authorized for detection and/or diagnosis of SARS-CoV-2 by FDA under an Emergency Use  Authorization (EUA).  This EUA will remain in effect (meaning this test can be used) for the duration of the COVID-19 declaration under Section 564(b)(1) of the Act, 21 U.S.C. section 360bbb-3(b)(1), unless the authorization is terminated or revoked sooner. Performed at [facility], [place] [address] [place], [place] [address]      Labs: BNP (last 3 results) Recent Labs    09/30/18 1102 01/05/19 0219  BNP 163.6* 440.3*   Basic Metabolic Panel: Recent Labs  Lab 01/05/19 0225 01/05/19 0632 01/06/19 0317  NA 140 141 140  K 3.9 3.9 4.5  CL 97*  --  102  CO2  --   --  30  GLUCOSE 128*  --  102*  BUN 6*  --  10  CREATININE 0.70  --  0.60  CALCIUM  --   --  9.0   Liver Function Tests: No results for input(s): AST, ALT, ALKPHOS, BILITOT, PROT, ALBUMIN in the last 168 hours. No results for input(s): LIPASE, AMYLASE in the last 168 hours. No results for input(s): AMMONIA in the last 168 hours.  CBC: Recent Labs  Lab 01/05/19 0219 01/05/19 0225 01/05/19 0632  WBC 9.4  --   --   NEUTROABS 5.6  --   --   HGB 15.9* 18.4* 17.3*  HCT 51.0* 54.0* 51.0*  MCV 88.1  --   --   PLT 287  --   --    Cardiac Enzymes: No results for input(s): CKTOTAL, CKMB, CKMBINDEX, TROPONINI in the last 168 hours. BNP: Invalid input(s): POCBNP CBG: No results for input(s): GLUCAP in the last 168 hours. D-Dimer No results for input(s): DDIMER in the last 72 hours. Hgb A1c No results for input(s): HGBA1C in the last 72 hours. Lipid Profile No results for input(s): CHOL, HDL, LDLCALC, TRIG, CHOLHDL, LDLDIRECT in the last 72 hours. Thyroid function studies No results for input(s): TSH, T4TOTAL, T3FREE, THYROIDAB in the last 72 hours.  Invalid input(s): FREET3 Anemia work up No results for input(s): VITAMINB12, FOLATE, FERRITIN, TIBC, IRON, RETICCTPCT in the last 72 hours. Urinalysis    Component Value Date/Time   COLORURINE YELLOW 12/24/2007 1833   APPEARANCEUR CLEAR 12/24/2007 1833    LABSPEC 1.008 12/24/2007 1833   PHURINE 6.0 12/24/2007 1833   GLUCOSEU NEGATIVE 12/24/2007 1833   HGBUR NEGATIVE 12/24/2007 1833   BILIRUBINUR NEGATIVE 12/24/2007 1833   KETONESUR NEGATIVE 12/24/2007 1833   PROTEINUR NEGATIVE 12/24/2007 1833   UROBILINOGEN 0.2 12/24/2007 1833   NITRITE NEGATIVE 12/24/2007 1833   LEUKOCYTESUR MODERATE (A) 12/24/2007 1833   Sepsis Labs Invalid input(s): PROCALCITONIN,  WBC,  LACTICIDVEN Microbiology Recent Results (from the past 240 hour(s))  SARS Coronavirus 2 [facility] order, Performed in [facility] hospital lab) Nasopharyngeal Nasopharyngeal Swab     Status: None   Collection Time: 01/05/19  2:27 AM   Specimen: Nasopharyngeal Swab  Result Value Ref Range Status   SARS Coronavirus 2 NEGATIVE NEGATIVE Final    Comment: (NOTE) If result is NEGATIVE SARS-CoV-2 target nucleic acids are NOT DETECTED. The SARS-CoV-2 RNA is generally detectable in upper and lower  respiratory specimens during the acute phase of infection. The lowest  concentration of SARS-CoV-2 viral copies this assay can detect is 250  copies / mL. A negative result does not preclude SARS-CoV-2 infection  and should not be used as the sole basis for treatment or other  patient management decisions.  A negative result may occur with  improper specimen collection / handling, submission of specimen other  than nasopharyngeal swab, presence of viral mutation(s) within the  areas targeted by this assay, and inadequate number of viral copies  (<250 copies / mL). A negative result must be combined with clinical  observations, patient history, and epidemiological information. If result is POSITIVE SARS-CoV-2 target nucleic acids are DETECTED. The SARS-CoV-2 RNA is generally detectable in upper and lower  respiratory specimens dur ing the acute phase of infection.  Positive  results are indicative of active infection with SARS-CoV-2.  Clinical  correlation with patient history and other  diagnostic information is  necessary to determine patient infection status.  Positive results do  not rule out bacterial infection or co-infection with other viruses. If result is PRESUMPTIVE POSTIVE SARS-CoV-2 nucleic acids MAY BE PRESENT.   A presumptive positive result was obtained on the submitted specimen  and confirmed on repeat testing.  While 2019 novel coronavirus  (SARS-CoV-2) nucleic acids may be present in the submitted sample  additional confirmatory testing may be necessary for epidemiological  and / or clinical management purposes  to differentiate between  SARS-CoV-2 and other Sarbecovirus currently  known to infect humans.  If clinically indicated additional testing with an alternate test  methodology [phone]) is advised. The SARS-CoV-2 RNA is generally  detectable in upper and lower respiratory sp ecimens during the acute  phase of infection. The expected result is Negative. Fact Sheet for Patients:  StrictlyIdeas.no Fact Sheet for Healthcare Providers: BankingDealers.co.za This test is not yet approved or cleared by the [place] FDA and has been authorized for detection and/or diagnosis of SARS-CoV-2 by FDA under an Emergency Use Authorization (EUA).  This EUA will remain in effect (meaning this test can be used) for the duration of the COVID-19 declaration under Section 564(b)(1) of the Act, 21 U.S.C. section 360bbb-3(b)(1), unless the authorization is terminated or revoked sooner. Performed at [facility], [place] [address] [place], [place] [address]      Time coordinating discharge: 45 minutes  SIGNED:   Tawni Millers, MD  [facility] 01/07/2019, 10:20 AM

## 2019-01-11 DIAGNOSIS — K5900 Constipation, unspecified: Secondary | ICD-10-CM

## 2019-01-25 ENCOUNTER — Ambulatory Visit: Payer: Medicare Other | Admitting: Pulmonary Disease

## 2019-01-25 ENCOUNTER — Encounter: Payer: Self-pay | Admitting: Pulmonary Disease

## 2019-01-25 ENCOUNTER — Other Ambulatory Visit: Payer: Self-pay

## 2019-01-25 VITALS — BP 150/88 | HR 86 | Temp 98.5°F | Ht 70.0 in | Wt 138.6 lb

## 2019-01-25 DIAGNOSIS — J432 Centrilobular emphysema: Secondary | ICD-10-CM

## 2019-01-25 DIAGNOSIS — R058 Other specified cough: Secondary | ICD-10-CM

## 2019-01-25 DIAGNOSIS — Z720 Tobacco use: Secondary | ICD-10-CM

## 2019-01-25 DIAGNOSIS — R05 Cough: Secondary | ICD-10-CM

## 2019-01-25 DIAGNOSIS — R918 Other nonspecific abnormal finding of lung field: Secondary | ICD-10-CM | POA: Diagnosis not present

## 2019-01-25 DIAGNOSIS — R059 Cough, unspecified: Secondary | ICD-10-CM

## 2019-01-25 MED ORDER — TRELEGY ELLIPTA 100-62.5-25 MCG/INH IN AEPB: 1.0000 | INHALATION_SPRAY | Freq: Every day | RESPIRATORY_TRACT | 0 refills | Status: DC

## 2019-01-25 NOTE — Patient Instructions (Signed)
Thank you for visiting Dr. [name] at [facility]. Today we recommend the following:  Orders Placed This Encounter  Procedures  . Pulmonary Function Test   Meds ordered this encounter  Medications  . Fluticasone-Umeclidin-Vilant (TRELEGY ELLIPTA) 100-62.5-25 MCG/INH AEPB    Sig: Inhale 1 puff into the lungs daily.    Dispense:  1 each    Refill:  0   Return in about 1 month (around 02/24/2019).    Please do your part to reduce the spread of COVID-19.

## 2019-01-25 NOTE — Progress Notes (Signed)
Synopsis: Referred in September 2020 for shortness of breath by [name], MD  Subjective:   PATIENT ID: Caroline Greer GENDER: female DOB: March 20, 1956, MRN: [id number]  Chief Complaint  Patient presents with  . Consult    Recently dx with COPD. She reports developing SOB a couple weeks ago. She notices that cutting the grass has been a big trigger. Reports intemittent cough with clear mucous. 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     Patient with a past medical history of hypertension, problem list includes COPD.  No prior PFTs in epic.  Patient was admitted to the hospital on 01/05/2019 for COPD exacerbation.  Patient was discharged from the hospital on 01/07/2019.  Recommending outpatient pulmonary follow-up.  Admitted to the hospital on 814 with medical history of hypertension depression tobacco abuse and COPD worsening dyspnea over the past 3 days.  Started after she was mowing her grass.  Included symptoms of wheezing coughing no chest pain and no fevers.  COVID negative.  Was found to be hypoxemic oxygen saturations in the 90s on 6 L nasal cannula tachycardic.  EKG with right axis deviation no ST changes negative T waves in V1 through 4.  Patient was treated with bronchodilators systemic steroids and discharged on 3 L nasal cannula.  Patient was encouraged to use quit smoking.  Today, overall in office patient has been doing well since her hospitalization.  She has been slowly increasing her exercise tolerance.  She has been mowing her grass.  She currently lives alone.  She enjoys fishing.  She plans on going this weekend.  Unfortunately she is still smoking.  She is been trying to cut down but finds that it is very difficult.  She has been working with her insurance to help cover cost of inhalers however most inhalers have been very expensive.  She has been relying on receiving samples from her PCP.  We  will also help her receive samples.  If needed we may be able to apply for financial assistance through manufacturer.  She does have daily dyspnea on exertion cough and shortness of breath.  She uses her nebulizer regularly at home.  Denies weight loss fevers or hemoptysis.   Past Medical History:  Diagnosis Date  . Hypertension      Family History  Problem Relation Age of Onset  . COPD Mother      Past Surgical History:  Procedure Laterality Date  . LAPAROTOMY  1974    Social History   Socioeconomic History  . Marital status: Single    Spouse name: Not on file  . Number of children: Not on file  . Years of education: Not on file  . Highest education level: Not on file  Occupational History  . Not on file  Social Needs  . Financial resource strain: Not on file  . Food insecurity    Worry: Not on file    Inability: Not on file  . Transportation needs    Medical: Not on file    Non-medical: Not on file  Tobacco Use  . Smoking status: Current Every Day Smoker    Packs/day: 0.67    Years: 43.00    Pack years: 28.81    Types: Cigarettes  . Smokeless tobacco: Never Used  Substance and Sexual Activity  . Alcohol use: Yes    Comment: gets 6 beers on a weekend and  drinks them  . Drug use: Not Currently    Types: Marijuana    Comment: tried to smoke some about a month ago  . Sexual activity: Not on file  Lifestyle  . Physical activity    Days per week: Not on file    Minutes per session: Not on file  . Stress: Not on file  Relationships  . Social Herbalist on phone: Not on file    Gets together: Not on file    Attends religious service: Not on file    Active member of club or organization: Not on file    Attends meetings of clubs or organizations: Not on file    Relationship status: Not on file  . Intimate partner violence    Fear of current or ex partner: Not on file    Emotionally abused: Not on file    Physically abused: Not on file    Forced  sexual activity: Not on file  Other Topics Concern  . Not on file  Social History Narrative  . Not on file     Allergies  Allergen Reactions  . Crab [Shellfish Allergy] Swelling  . Erythromycin Other (See Comments)    convulsion     Outpatient Medications Prior to Visit  Medication Sig Dispense Refill  . albuterol (VENTOLIN HFA) 108 (90 Base) MCG/ACT inhaler Inhale 2 puffs into the lungs every 6 (six) hours as needed for wheezing or shortness of breath. 8 g 0  . ALPRAZolam (XANAX) 0.5 MG tablet Take 0.5 mg by mouth at bedtime as needed for sleep.    Marland Kitchen amLODipine (NORVASC) 2.5 MG tablet Take 2.5 mg by mouth daily.    Marland Kitchen aspirin EC 81 MG tablet Take 81 mg by mouth daily.    . benazepril (LOTENSIN) 40 MG tablet Take 40 mg by mouth daily.    . budesonide-formoterol (SYMBICORT) 160-4.5 MCG/ACT inhaler Inhale 2 puffs into the lungs 2 (two) times daily. 1 Inhaler 0  . cetirizine (ZYRTEC) 10 MG tablet Take 10 mg by mouth daily.    . Flaxseed, Linseed, (FLAX SEED OIL) 1000 MG CAPS Take 1,000 mg by mouth daily.    . nicotine (NICODERM CQ - DOSED IN MG/24 HOURS) 14 mg/24hr patch Place 1 patch (14 mg total) onto the skin daily. 28 patch 0  . polyethylene glycol (MIRALAX / GLYCOLAX) 17 g packet Take 17 g by mouth daily. 14 each 0  . vitamin B-12 (CYANOCOBALAMIN) 1000 MCG tablet Take 1,000 mcg by mouth daily.    . simethicone (MYLICON) 0000000 MG chewable tablet Chew 125 mg by mouth every 6 (six) hours as needed for flatulence.     No facility-administered medications prior to visit.     Review of Systems  Constitutional: Negative for chills, fever, malaise/fatigue and weight loss.  HENT: Negative for hearing loss, sore throat and tinnitus.   Eyes: Negative for blurred vision and double vision.  Respiratory: Positive for cough, sputum production, shortness of breath and wheezing. Negative for hemoptysis and stridor.   Cardiovascular: Negative for chest pain, palpitations, orthopnea, leg swelling  and PND.  Gastrointestinal: Negative for abdominal pain, constipation, diarrhea, heartburn, nausea and vomiting.  Genitourinary: Negative for dysuria, hematuria and urgency.  Musculoskeletal: Negative for joint pain and myalgias.  Skin: Negative for itching and rash.  Neurological: Negative for dizziness, tingling, weakness and headaches.  Endo/Heme/Allergies: Negative for environmental allergies. Does not bruise/bleed easily.  Psychiatric/Behavioral: Negative for depression. The patient is not nervous/anxious and does  not have insomnia.   All other systems reviewed and are negative.    Objective:  Physical Exam Vitals signs reviewed.  Constitutional:      General: She is not in acute distress.    Appearance: She is well-developed.  HENT:     Head: Normocephalic and atraumatic.     Mouth/Throat:     Pharynx: No oropharyngeal exudate.  Eyes:     Conjunctiva/sclera: Conjunctivae normal.     Pupils: Pupils are equal, round, and reactive to light.  Neck:     Vascular: No JVD.     Trachea: No tracheal deviation.     Comments: Loss of supraclavicular fat Cardiovascular:     Rate and Rhythm: Normal rate and regular rhythm.     Heart sounds: S1 normal and S2 normal.     Comments: Distant heart tones Pulmonary:     Effort: No tachypnea or accessory muscle usage.     Breath sounds: No stridor. Decreased breath sounds (throughout all lung fields) present. No wheezing, rhonchi or rales.  Abdominal:     General: Bowel sounds are normal. There is no distension.     Palpations: Abdomen is soft.     Tenderness: There is no abdominal tenderness.  Musculoskeletal:        General: Deformity (muscle wasting ) present.  Skin:    General: Skin is warm and dry.     Capillary Refill: Capillary refill takes less than 2 seconds.     Findings: No rash.  Neurological:     Mental Status: She is alert and oriented to person, place, and time.  Psychiatric:        Behavior: Behavior normal.       Vitals:   01/25/19 1540  BP: (!) 150/88  Pulse: 86  Temp: 98.5 F (36.9 C)  TempSrc: Temporal  SpO2: 94%  Weight: 138 lb 9.6 oz (62.9 kg)  Height: 5\' 10"  (1.778 m)   94% on RA BMI Readings from Last 3 Encounters:  01/25/19 19.89 kg/m  09/30/18 19.04 kg/m   Wt Readings from Last 3 Encounters:  01/25/19 138 lb 9.6 oz (62.9 kg)  09/30/18 135 lb 9.3 oz (61.5 kg)     CBC    Component Value Date/Time   WBC 9.4 01/05/2019 0219   RBC 5.79 (H) 01/05/2019 0219   HGB 17.3 (H) 01/05/2019 0632   HCT 51.0 (H) 01/05/2019 0632   PLT 287 01/05/2019 0219   MCV 88.1 01/05/2019 0219   MCH 27.5 01/05/2019 0219   MCHC 31.2 01/05/2019 0219   RDW 18.1 (H) 01/05/2019 0219   LYMPHSABS 2.5 01/05/2019 0219   MONOABS 0.6 01/05/2019 0219   EOSABS 0.6 (H) 01/05/2019 0219   BASOSABS 0.1 01/05/2019 0219    Chest Imaging: CXR - 8/14 Cardiomegaly, evidence of emphysema  The patient's images have been independently reviewed by me.   09/30/2018 CT chest: Multiple bilateral pulmonary nodules largest 6 mm the right upper lobe recommending noncontrast CT follow-up 3 to 6 months. The patient's images have been independently reviewed by me.    Pulmonary Functions Testing Results: No flowsheet data found.  FeNO: None   Pathology: None   Echocardiogram: None   Heart Catheterization: None     Assessment & Plan:     ICD-10-CM   1. Centrilobular emphysema ([place])  J43.2 Pulmonary Function Test  2. Tobacco abuse  Z72.0 Pulmonary Function Test  3. Cough  R05   4. Sputum production  R05   5. Abnormal findings  on diagnostic imaging of lung  R91.8 CT CHEST WO CONTRAST  6. Lung nodules  R91.8     Discussion:  This is a 63 year old female referred for evaluation of COPD.  She is a longtime smoker.  Recent admission for COPD exacerbation to the hospital.  Has been on a few inhalers in the past.  She has not had any pulmonary function test.  She was given Symbicort with a failed trial.  She was also  given Dulera.  Both of which medications were expensive.  She had been given a prescription for Norco in the past also this was expensive.  Plan: We will order full pulmonary function tests. Start patient on Trelegy inhaler.  She was given samples of this today in the office. She is to call us back once we get more samples and to help supply this to her. We will work with her insurance to help find the most affordable inhaler regimen. Based on her symptoms likely needs to be on triple therapy. We will work with our pharmacist to help with cost of medications. Repeat noncontrasted CT of the chest for evaluation of lung nodules high risk smoker.  Last CT scan was in May 2020 recommending 3 to 78-month follow-up.  Patient to return to clinic in 4 weeks  Greater than 50% of this patient 60-minute office visit was spent face-to-face discussing above recommendations and treatment plan.    Current Outpatient Medications:  .  albuterol (VENTOLIN HFA) 108 (90 Base) MCG/ACT inhaler, Inhale 2 puffs into the lungs every 6 (six) hours as needed for wheezing or shortness of breath., Disp: 8 g, Rfl: 0 .  ALPRAZolam (XANAX) 0.5 MG tablet, Take 0.5 mg by mouth at bedtime as needed for sleep., Disp: , Rfl:  .  amLODipine (NORVASC) 2.5 MG tablet, Take 2.5 mg by mouth daily., Disp: , Rfl:  .  aspirin EC 81 MG tablet, Take 81 mg by mouth daily., Disp: , Rfl:  .  benazepril (LOTENSIN) 40 MG tablet, Take 40 mg by mouth daily., Disp: , Rfl:  .  budesonide-formoterol (SYMBICORT) 160-4.5 MCG/ACT inhaler, Inhale 2 puffs into the lungs 2 (two) times daily., Disp: 1 Inhaler, Rfl: 0 .  cetirizine (ZYRTEC) 10 MG tablet, Take 10 mg by mouth daily., Disp: , Rfl:  .  Flaxseed, Linseed, (FLAX SEED OIL) 1000 MG CAPS, Take 1,000 mg by mouth daily., Disp: , Rfl:  .  nicotine (NICODERM CQ - DOSED IN MG/24 HOURS) 14 mg/24hr patch, Place 1 patch (14 mg total) onto the skin daily., Disp: 28 patch, Rfl: 0 .  polyethylene glycol  (MIRALAX / GLYCOLAX) 17 g packet, Take 17 g by mouth daily., Disp: 14 each, Rfl: 0 .  vitamin B-12 (CYANOCOBALAMIN) 1000 MCG tablet, Take 1,000 mcg by mouth daily., Disp: , Rfl:  .  Fluticasone-Umeclidin-Vilant (TRELEGY ELLIPTA) 100-62.5-25 MCG/INH AEPB, Inhale 1 puff into the lungs daily., Disp: 1 each, Rfl: 0   Garner Nash, DO [facility] 01/25/2019 4:16 PM

## 2019-01-25 NOTE — Progress Notes (Signed)
Patient seen in the office today and instructed on use of Trelegy.  Patient expressed understanding and demonstrated technique.  

## 2019-02-09 ENCOUNTER — Telehealth: Payer: Self-pay | Admitting: Pulmonary Disease

## 2019-02-09 MED ORDER — TRELEGY ELLIPTA 100-62.5-25 MCG/INH IN AEPB: 1.0000 | INHALATION_SPRAY | Freq: Every day | RESPIRATORY_TRACT | 0 refills | Status: DC

## 2019-02-09 NOTE — Telephone Encounter (Signed)
Spoke with pt and advise dher that I left a sample of Trelegy up front. Pt understood and nothing further is needed.

## 2019-02-22 ENCOUNTER — Inpatient Hospital Stay: Admission: RE | Admit: 2019-02-22 | Payer: Medicare Other | Source: Ambulatory Visit

## 2019-02-22 ENCOUNTER — Other Ambulatory Visit: Payer: Self-pay

## 2019-02-22 ENCOUNTER — Ambulatory Visit (INDEPENDENT_AMBULATORY_CARE_PROVIDER_SITE_OTHER)
Admission: RE | Admit: 2019-02-22 | Discharge: 2019-02-22 | Disposition: A | Discharge status: Home / Self Care | Payer: Medicare Other | Source: Ambulatory Visit | Attending: Pulmonary Disease | Admitting: Pulmonary Disease

## 2019-02-22 DIAGNOSIS — R918 Other nonspecific abnormal finding of lung field: Secondary | ICD-10-CM

## 2019-02-26 ENCOUNTER — Telehealth: Payer: Self-pay | Admitting: Pulmonary Disease

## 2019-02-26 MED ORDER — TRELEGY ELLIPTA 100-62.5-25 MCG/INH IN AEPB: 1.0000 | INHALATION_SPRAY | Freq: Every day | RESPIRATORY_TRACT | 0 refills | Status: DC

## 2019-02-26 NOTE — Telephone Encounter (Signed)
1 sample trelegy up front for pick up  Spoke with the pt and notified that this is ready and scheduled rov since she was due

## 2019-03-05 ENCOUNTER — Telehealth: Payer: Self-pay | Admitting: Pulmonary Disease

## 2019-03-06 NOTE — Telephone Encounter (Signed)
Attempted to call pt to scheduled pft - phone rang - no answer and no vm -pr

## 2019-03-07 ENCOUNTER — Encounter: Payer: Self-pay | Admitting: Pulmonary Disease

## 2019-03-07 ENCOUNTER — Ambulatory Visit (INDEPENDENT_AMBULATORY_CARE_PROVIDER_SITE_OTHER): Payer: Medicare Other | Admitting: Pulmonary Disease

## 2019-03-07 ENCOUNTER — Other Ambulatory Visit: Payer: Self-pay

## 2019-03-07 VITALS — BP 128/78 | HR 73 | Ht 70.75 in | Wt 140.0 lb

## 2019-03-07 DIAGNOSIS — J432 Centrilobular emphysema: Secondary | ICD-10-CM | POA: Diagnosis not present

## 2019-03-07 DIAGNOSIS — Z720 Tobacco use: Secondary | ICD-10-CM | POA: Diagnosis not present

## 2019-03-07 DIAGNOSIS — R918 Other nonspecific abnormal finding of lung field: Secondary | ICD-10-CM

## 2019-03-07 MED ORDER — TRELEGY ELLIPTA 100-62.5-25 MCG/INH IN AEPB: 1.0000 | INHALATION_SPRAY | Freq: Every day | RESPIRATORY_TRACT | 0 refills | Status: DC

## 2019-03-07 MED ORDER — TRELEGY ELLIPTA 100-62.5-25 MCG/INH IN AEPB: 1.0000 | INHALATION_SPRAY | Freq: Every day | RESPIRATORY_TRACT | 11 refills | Status: DC

## 2019-03-07 MED ORDER — ALBUTEROL SULFATE HFA 108 (90 BASE) MCG/ACT IN AERS: 2.0000 | INHALATION_SPRAY | Freq: Four times a day (QID) | RESPIRATORY_TRACT | 11 refills | Status: DC | PRN

## 2019-03-07 NOTE — Progress Notes (Signed)
Synopsis: Referred in September 2020 for shortness of breath by [name], MD  Subjective:   PATIENT ID: Caroline Greer: female DOB: 1956-04-24, MRN: [id number]  Chief Complaint  Patient presents with  . Follow-up    Patient with a past medical history of hypertension, problem list includes COPD.  No prior PFTs in epic.  Patient was admitted to the hospital on 01/05/2019 for COPD exacerbation.  Patient was discharged from the hospital on 01/07/2019.  Recommending outpatient pulmonary follow-up.  Admitted to the hospital on 814 with medical history of hypertension depression tobacco abuse and COPD worsening dyspnea over the past 3 days.  Started after she was mowing her grass.  Included symptoms of wheezing coughing no chest pain and no fevers.  COVID negative.  Was found to be hypoxemic oxygen saturations in the 90s on 6 L nasal cannula tachycardic.  EKG with right axis deviation no ST changes negative T waves in V1 through 4.  Patient was treated with bronchodilators systemic steroids and discharged on 3 L nasal cannula.  Patient was encouraged to use quit smoking.  Today, overall in office patient has been doing well since her hospitalization.  She has been slowly increasing her exercise tolerance.  She has been mowing her grass.  She currently lives alone.  She enjoys fishing.  She plans on going this weekend.  Unfortunately she is still smoking.  She is been trying to cut down but finds that it is very difficult.  She has been working with her insurance to help cover cost of inhalers however most inhalers have been very expensive.  She has been relying on receiving samples from her PCP.  We will also help her receive samples.  If needed we may be able to apply for financial assistance through manufacturer.  She does have daily dyspnea on exertion cough and shortness of breath.  She uses her nebulizer regularly at home.  Denies weight loss fevers or hemoptysis.  OV 03/07/2019: Patient here  today for COPD follow-up.  At last office visit patient was started on Trelegy inhaler.  Patient has been doing very well on Trelegy inhaler after start of the medication last office visit.  She needs refills of this medication today would like to have samples.  Also given refill of her albuterol inhaler.  Today we discussed her CT scan results which revealed multiple bilateral pulmonary nodules all of which were stable and benign in comparison to previous.  She does have evidence of emphysema.  Otherwise her dyspnea on exertion is doing well.  Delay in scheduling of her PFTs due to [place].  I think we can push this off until her repeat follow-up with [place] in 6 months.  Otherwise patient denies dyspnea on exertion nausea vomiting diarrhea chest pain.  No hemoptysis.   Past Medical History:  Diagnosis Date  . Hypertension      Family History  Problem Relation Age of Onset  . COPD Mother      Past Surgical History:  Procedure Laterality Date  . LAPAROTOMY  1974    Social History   Socioeconomic History  . Marital status: Single    Spouse name: Not on file  . Number of children: Not on file  . Years of education: Not on file  . Highest education level: Not on file  Occupational History  . Not on file  Social Needs  . Financial resource strain: Not on file  . Food insecurity    Worry: Not on file  Inability: Not on file  . Transportation needs    Medical: Not on file    Non-medical: Not on file  Tobacco Use  . Smoking status: Current Every Day Smoker    Packs/day: 0.67    Years: 43.00    Pack years: 28.81    Types: Cigarettes  . Smokeless tobacco: Never Used  Substance and Sexual Activity  . Alcohol use: Yes    Comment: gets 6 beers on a weekend and drinks them  . Drug use: Not Currently    Types: Marijuana    Comment: tried to smoke some about a month ago  . Sexual activity: Not on file  Lifestyle  . Physical activity    Days per week: Not on file    Minutes per  session: Not on file  . Stress: Not on file  Relationships  . Social Herbalist on phone: Not on file    Gets together: Not on file    Attends religious service: Not on file    Active member of club or organization: Not on file    Attends meetings of clubs or organizations: Not on file    Relationship status: Not on file  . Intimate partner violence    Fear of current or ex partner: Not on file    Emotionally abused: Not on file    Physically abused: Not on file    Forced sexual activity: Not on file  Other Topics Concern  . Not on file  Social History Narrative  . Not on file     Allergies  Allergen Reactions  . Crab [Shellfish Allergy] Swelling  . Erythromycin Other (See Comments)    convulsion     Outpatient Medications Prior to Visit  Medication Sig Dispense Refill  . albuterol (VENTOLIN HFA) 108 (90 Base) MCG/ACT inhaler Inhale 2 puffs into the lungs every 6 (six) hours as needed for wheezing or shortness of breath. 8 g 0  . ALPRAZolam (XANAX) 0.5 MG tablet Take 0.5 mg by mouth at bedtime as needed for sleep.    Marland Kitchen amLODipine (NORVASC) 2.5 MG tablet Take 2.5 mg by mouth daily.    Marland Kitchen aspirin EC 81 MG tablet Take 81 mg by mouth daily.    . benazepril (LOTENSIN) 40 MG tablet Take 40 mg by mouth daily.    . cetirizine (ZYRTEC) 10 MG tablet Take 10 mg by mouth daily.    . Flaxseed, Linseed, (FLAX SEED OIL) 1000 MG CAPS Take 1,000 mg by mouth daily.    . Fluticasone-Umeclidin-Vilant (TRELEGY ELLIPTA) 100-62.5-25 MCG/INH AEPB Inhale 1 puff into the lungs daily. 1 each 0  . nicotine (NICODERM CQ - DOSED IN MG/24 HOURS) 14 mg/24hr patch Place 1 patch (14 mg total) onto the skin daily. 28 patch 0  . polyethylene glycol (MIRALAX / GLYCOLAX) 17 g packet Take 17 g by mouth daily. 14 each 0  . vitamin B-12 (CYANOCOBALAMIN) 1000 MCG tablet Take 1,000 mcg by mouth daily.    . budesonide-formoterol (SYMBICORT) 160-4.5 MCG/ACT inhaler Inhale 2 puffs into the lungs 2 (two) times  daily. 1 Inhaler 0   No facility-administered medications prior to visit.     Review of Systems  Constitutional: Negative for chills, fever, malaise/fatigue and weight loss.  HENT: Negative for hearing loss, sore throat and tinnitus.   Eyes: Negative for blurred vision and double vision.  Respiratory: Positive for shortness of breath. Negative for cough, hemoptysis, sputum production, wheezing and stridor.   Cardiovascular: Negative  for chest pain, palpitations, orthopnea, leg swelling and PND.  Gastrointestinal: Negative for abdominal pain, constipation, diarrhea, heartburn, nausea and vomiting.  Genitourinary: Negative for dysuria, hematuria and urgency.  Musculoskeletal: Negative for joint pain and myalgias.  Skin: Negative for itching and rash.  Neurological: Negative for dizziness, tingling, weakness and headaches.  Endo/Heme/Allergies: Negative for environmental allergies. Does not bruise/bleed easily.  Psychiatric/Behavioral: Negative for depression. The patient is not nervous/anxious and does not have insomnia.   All other systems reviewed and are negative.    Objective:  Physical Exam Vitals signs reviewed.  Constitutional:      General: She is not in acute distress.    Appearance: She is well-developed.  HENT:     Head: Normocephalic and atraumatic.  Eyes:     General: No scleral icterus.    Conjunctiva/sclera: Conjunctivae normal.     Pupils: Pupils are equal, round, and reactive to light.  Neck:     Musculoskeletal: Neck supple.     Vascular: No JVD.     Trachea: No tracheal deviation.  Cardiovascular:     Rate and Rhythm: Normal rate and regular rhythm.     Heart sounds: Normal heart sounds. No murmur.  Pulmonary:     Effort: Pulmonary effort is normal. No tachypnea, accessory muscle usage or respiratory distress.     Breath sounds: No stridor. No wheezing, rhonchi or rales.     Comments: Diminished breath sounds   Abdominal:     General: Bowel sounds are  normal. There is no distension.     Palpations: Abdomen is soft.     Tenderness: There is no abdominal tenderness.  Musculoskeletal:        General: No tenderness.  Lymphadenopathy:     Cervical: No cervical adenopathy.  Skin:    General: Skin is warm and dry.     Capillary Refill: Capillary refill takes less than 2 seconds.     Findings: No rash.  Neurological:     Mental Status: She is alert and oriented to person, place, and time.  Psychiatric:        Behavior: Behavior normal.      Vitals:   03/07/19 1537  BP: 128/78  Pulse: 73  SpO2: 92%  Weight: 140 lb (63.5 kg)  Height: 5' 10.75" (1.797 m)   92% on RA BMI Readings from Last 3 Encounters:  03/07/19 19.66 kg/m  01/25/19 19.89 kg/m  09/30/18 19.04 kg/m   Wt Readings from Last 3 Encounters:  03/07/19 140 lb (63.5 kg)  01/25/19 138 lb 9.6 oz (62.9 kg)  09/30/18 135 lb 9.3 oz (61.5 kg)     CBC    Component Value Date/Time   WBC 9.4 01/05/2019 0219   RBC 5.79 (H) 01/05/2019 0219   HGB 17.3 (H) 01/05/2019 0632   HCT 51.0 (H) 01/05/2019 0632   PLT 287 01/05/2019 0219   MCV 88.1 01/05/2019 0219   MCH 27.5 01/05/2019 0219   MCHC 31.2 01/05/2019 0219   RDW 18.1 (H) 01/05/2019 0219   LYMPHSABS 2.5 01/05/2019 0219   MONOABS 0.6 01/05/2019 0219   EOSABS 0.6 (H) 01/05/2019 0219   BASOSABS 0.1 01/05/2019 0219    Chest Imaging: CXR - 8/14 Cardiomegaly, evidence of emphysema  The patient's images have been independently reviewed by me.   09/30/2018 CT chest: Multiple bilateral pulmonary nodules largest 6 mm the right upper lobe recommending noncontrast CT follow-up 3 to 6 months. The patient's images have been independently reviewed by me.  CT chest 02/22/2019: Previously noted pulmonary nodules decreased in size considered benign. Diffuse bronchial wall thickening consistent with COPD history. Three-vessel coronary disease. The patient's images have been independently reviewed by me.    Pulmonary  Functions Testing Results: No flowsheet data found.  FeNO: None   Pathology: None   Echocardiogram: None   Heart Catheterization: None     Assessment & Plan:     ICD-10-CM   1. Centrilobular emphysema ([place])  J43.2   2. Tobacco abuse  Z72.0   3. Multiple lung nodules  R91.8     Discussion:  63 year old female evaluated recently for COPD.  Unable to get PFTs completed due to delay for COVID scheduling.  Was started on Trelegy inhaler.  She is a longtime smoker.  Has smoked for 43 years at max 1 pack/day.  She still trying to quit.  Unfortunately she has not been able to do so since the last time we saw each other.  Today in the office we also reviewed her CT scan results that were just completed for follow-up of lung nodules.  These are benign in nature and looks stable in comparison to her previous imaging.  Plan: Trelegy inhaler continued. Refill sent to pharmacy. PFTs to be completed at next office visit. She needs to enroll in our lung cancer screening program.  She has agreed to this. Next screening exam would need to start October 2021.  Referral and dates placed. Patient to follow-up in our clinic in 6 months. We will have PFTs completed at this time. Continue her current inhaler regimen.  Greater than 50% of this patient's 25-minute office visit was been face-to-face discussing above recommendations treatment plan.  Review of inhaler use as well as recent CT imaging of the chest.   Current Outpatient Medications:  .  albuterol (VENTOLIN HFA) 108 (90 Base) MCG/ACT inhaler, Inhale 2 puffs into the lungs every 6 (six) hours as needed for wheezing or shortness of breath., Disp: 8 g, Rfl: 0 .  ALPRAZolam (XANAX) 0.5 MG tablet, Take 0.5 mg by mouth at bedtime as needed for sleep., Disp: , Rfl:  .  amLODipine (NORVASC) 2.5 MG tablet, Take 2.5 mg by mouth daily., Disp: , Rfl:  .  aspirin EC 81 MG tablet, Take 81 mg by mouth daily., Disp: , Rfl:  .  benazepril (LOTENSIN) 40 MG  tablet, Take 40 mg by mouth daily., Disp: , Rfl:  .  cetirizine (ZYRTEC) 10 MG tablet, Take 10 mg by mouth daily., Disp: , Rfl:  .  Flaxseed, Linseed, (FLAX SEED OIL) 1000 MG CAPS, Take 1,000 mg by mouth daily., Disp: , Rfl:  .  Fluticasone-Umeclidin-Vilant (TRELEGY ELLIPTA) 100-62.5-25 MCG/INH AEPB, Inhale 1 puff into the lungs daily., Disp: 1 each, Rfl: 0 .  nicotine (NICODERM CQ - DOSED IN MG/24 HOURS) 14 mg/24hr patch, Place 1 patch (14 mg total) onto the skin daily., Disp: 28 patch, Rfl: 0 .  polyethylene glycol (MIRALAX / GLYCOLAX) 17 g packet, Take 17 g by mouth daily., Disp: 14 each, Rfl: 0 .  vitamin B-12 (CYANOCOBALAMIN) 1000 MCG tablet, Take 1,000 mcg by mouth daily., Disp: , Rfl:    Garner Nash, DO [facility] 03/07/2019 3:46 PM

## 2019-03-07 NOTE — Patient Instructions (Signed)
Thank you for visiting Dr. [name] at [facility]. Today we recommend the following: Orders Placed This Encounter  Procedures  . Ambulatory Referral for Lung Cancer Scre   Meds ordered this encounter  Medications  . Fluticasone-Umeclidin-Vilant (TRELEGY ELLIPTA) 100-62.5-25 MCG/INH AEPB    Sig: Inhale 1 puff into the lungs daily.    Dispense:  1 each    Refill:  11   Return in about 6 months (around 09/05/2019) for with APP or Dr. [name].    Please do your part to reduce the spread of COVID-19.

## 2019-03-08 NOTE — Telephone Encounter (Signed)
Pt seen by Dr. [name] on 03/07/2019 - per note - BI recommends pt return in 6 months and complete pft at that time - recall placed in pt chart -pr

## 2019-05-15 ENCOUNTER — Telehealth: Payer: Self-pay | Admitting: Pulmonary Disease

## 2019-05-15 NOTE — Telephone Encounter (Signed)
Spoke with pt, states that since starting Trelegy in October pt has had experienced a raw throat, b/l eye blurriness, sore teeth, joint pain.  Pt states she's been experiencing these s/s over 1 month.  Pt uses Trelegy before she brushes her teeth in the morning, states she gargles and brushes her teeth after Trelegy use, and has also used 2 bottles of magic mouthwash prescribed by her PCP to help with little relief. I advised pt to immediately stop using this inhaler.  Pt would like recs.   Pharmacy: [facility] on [address].   Sending to APP since BI is not in office.  Aaron Edelman please advise on recs.  Thanks!

## 2019-05-15 NOTE — Telephone Encounter (Signed)
atc pt back, no answer and vm not set up yet. Caroline Greer states that pt needs to be scheduled for an in-person office visit with one of the APPs within the next week, and also needs to be scheduled for the PFT that was ordered in September 2020 for next available.   We cannot take her medications from her, but she is welcome to contact the pharmacy to see if this can be returned.  We have no control over the pharmacy policy.

## 2019-05-15 NOTE — Telephone Encounter (Signed)
Agree okay to stop Trelegy Ellipta at this time.  If the patient is interested in trying additional inhalers please schedule for a follow-up visit with APP or Dr. [name].  Please coordinate with the PCC's to see when the patient can be scheduled for pulmonary function testing.  This was ordered in September/2020.  This needs to be completed.  Follow-up with Dr. [name] or an APP after completing pulmonary function testing.  Wyn Quaker, FNP

## 2019-05-15 NOTE — Telephone Encounter (Signed)
Pick up Trelogy yesterday.  Not sure if pharmacy will take it back.  Wondering if this could be given to someone else.  Please advise.  [phone].

## 2019-05-17 NOTE — Telephone Encounter (Signed)
Called and spoke with pt letting her know the info stated by Aaron Edelman and pt verbalized understanding. Pt has been scheduled for a visit with Beth Wed. 12/30. Nothing further needed.

## 2019-05-23 ENCOUNTER — Encounter: Payer: Self-pay | Admitting: Primary Care

## 2019-05-23 ENCOUNTER — Other Ambulatory Visit: Payer: Self-pay

## 2019-05-23 ENCOUNTER — Ambulatory Visit (INDEPENDENT_AMBULATORY_CARE_PROVIDER_SITE_OTHER): Payer: Medicare Other | Admitting: Primary Care

## 2019-05-23 VITALS — BP 130/80 | HR 82 | Temp 97.2°F | Ht 70.0 in | Wt 137.0 lb

## 2019-05-23 DIAGNOSIS — Z720 Tobacco use: Secondary | ICD-10-CM

## 2019-05-23 DIAGNOSIS — F17210 Nicotine dependence, cigarettes, uncomplicated: Secondary | ICD-10-CM

## 2019-05-23 DIAGNOSIS — J449 Chronic obstructive pulmonary disease, unspecified: Secondary | ICD-10-CM

## 2019-05-23 DIAGNOSIS — J432 Centrilobular emphysema: Secondary | ICD-10-CM | POA: Diagnosis not present

## 2019-05-23 MED ORDER — STIOLTO RESPIMAT 2.5-2.5 MCG/ACT IN AERS: 2.0000 | INHALATION_SPRAY | Freq: Every day | RESPIRATORY_TRACT | 0 refills | Status: DC

## 2019-05-23 NOTE — Assessment & Plan Note (Signed)
-   Actively smoking 1/2 ppd - Refer to Lung cancer screening clinic and pharmacy for smoking cessation

## 2019-05-23 NOTE — Assessment & Plan Note (Addendum)
-   Reports side effects with Trelegy - Trial off inhaled steroid d/t oral candidiasis and joint pain  - Start Stiolto 2 puffs once daily, if need to add back ICS recommend HFA with spacer  - Needs to reschedule PFTs - FU in 2 weeks

## 2019-05-23 NOTE — Patient Instructions (Addendum)
Recommendations: Stop trelegy Trial Stiolto 2 puffs once daily  Orders: Needs to reschedule PFTs   Referral: Lung cancer screening clinic  Pharmacy consult for smoking cessation  Follow-up: Televisit in 1-2 weeks with Eustaquio Maize NP

## 2019-05-23 NOTE — Progress Notes (Signed)
@Patient  ID: Caroline Greer, female    DOB: [birth year], 63 y.o.   MRN: [id number]  Chief Complaint  Patient presents with  . Follow-up    medication throat feels like it on fire     Referring provider: [name], MD  HPI: 63 year old female, current every day smoker (43 pack year hx). PMH significant for COPD, centrilobular emphysema, multiple lung nodules. Patient of Dr. [name], last seen on 03/07/19. Started on Trelegy in October 2020. No PFTs on file, these have has not been completed d/t covid. Eosinophils 300-600.   05/23/2019  Patient presents today for acute office visit. Reports side effects including oral/esophageal thrush, joint pain and blurred vision with Trelegy use over the last month. Her breathing is stable. She is complaint with rinsing her mouth after inhaler use. She has used two bottles of magic mouth wash prescribed by PCP with some relief. She has been smoking more recently appox 1/2 pack per day. She wants to quit smoking. Using nicotine patches. Denies fever, chills, HA, confusion, chest tightness, wheezing or cough.   Chest Imaging: CXR - 8/14 Cardiomegaly, evidence of emphysema  The patient's images have been independently reviewed by me.   09/30/2018 CT chest: Multiple bilateral pulmonary nodules largest 6 mm the right upper lobe recommending noncontrast CT follow-up 3 to 6 months. The patient's images have been independently reviewed by me.    CT chest 02/22/2019: Previously noted pulmonary nodules decreased in size considered benign. Diffuse bronchial wall thickening consistent with COPD history. Three-vessel coronary disease. The patient's images have been independently reviewed by me.    Pulmonary Functions Testing Results: No flowsheet data found.  FeNO: None   Allergies  Allergen Reactions  . Crab [Shellfish Allergy] Swelling  . Erythromycin Other (See Comments)    convulsion     There is no immunization history on file for this  patient.  Past Medical History:  Diagnosis Date  . Hypertension     Tobacco History: Social History   Tobacco Use  Smoking Status Current Every Day Smoker  . Packs/day: 0.67  . Years: 43.00  . Pack years: 28.81  . Types: Cigarettes  Smokeless Tobacco Never Used   Ready to quit: Not Answered Counseling given: Not Answered   Outpatient Medications Prior to Visit  Medication Sig Dispense Refill  . albuterol (VENTOLIN HFA) 108 (90 Base) MCG/ACT inhaler Inhale 2 puffs into the lungs every 6 (six) hours as needed for wheezing or shortness of breath. 8 g 11  . ALPRAZolam (XANAX) 0.5 MG tablet Take 0.5 mg by mouth at bedtime as needed for sleep.    Marland Kitchen amLODipine (NORVASC) 2.5 MG tablet Take 2.5 mg by mouth daily.    Marland Kitchen aspirin EC 81 MG tablet Take 81 mg by mouth daily.    . benazepril (LOTENSIN) 40 MG tablet Take 40 mg by mouth daily.    . Flaxseed, Linseed, (FLAX SEED OIL) 1000 MG CAPS Take 1,000 mg by mouth daily.    . polyethylene glycol (MIRALAX / GLYCOLAX) 17 g packet Take 17 g by mouth daily. 14 each 0  . vitamin B-12 (CYANOCOBALAMIN) 1000 MCG tablet Take 1,000 mcg by mouth daily.    . cetirizine (ZYRTEC) 10 MG tablet Take 10 mg by mouth daily.    . Fluticasone-Umeclidin-Vilant (TRELEGY ELLIPTA) 100-62.5-25 MCG/INH AEPB Inhale 1 puff into the lungs daily. (Patient not taking: Reported on 05/23/2019) 1 each 11  . Fluticasone-Umeclidin-Vilant (TRELEGY ELLIPTA) 100-62.5-25 MCG/INH AEPB Inhale 1 puff into the lungs  daily. (Patient not taking: Reported on 05/23/2019) 2 each 0  . nicotine (NICODERM CQ - DOSED IN MG/24 HOURS) 14 mg/24hr patch Place 1 patch (14 mg total) onto the skin daily. (Patient not taking: Reported on 05/23/2019) 28 patch 0   No facility-administered medications prior to visit.   Review of Systems  Review of Systems  HENT: Positive for sore throat.   Eyes: Positive for visual disturbance.  Respiratory: Negative for chest tightness, shortness of breath and  wheezing.   Cardiovascular: Negative.   Neurological: Negative for dizziness, tremors, speech difficulty, weakness, numbness and headaches.   Physical Exam  BP 130/80 (BP Location: Left Arm, Patient Position: Sitting, Cuff Size: Normal)   Pulse 82   Temp (!) 97.2 F (36.2 C) (Temporal)   Ht 5\' 10"  (1.778 m)   Wt 137 lb (62.1 kg)   SpO2 91% Comment: room air  BMI 19.66 kg/m  Physical Exam Constitutional:      Appearance: Normal appearance.  HENT:     Head: Normocephalic and atraumatic.     Mouth/Throat:     Pharynx: Oropharynx is clear.  Cardiovascular:     Rate and Rhythm: Normal rate and regular rhythm.  Pulmonary:     Effort: Pulmonary effort is normal.     Comments: Diminished, scattered wheezing Musculoskeletal:        General: Normal range of motion.  Skin:    General: Skin is warm and dry.  Neurological:     General: No focal deficit present.     Mental Status: She is alert and oriented to person, place, and time. Mental status is at baseline.  Psychiatric:        Mood and Affect: Mood normal.        Behavior: Behavior normal.        Thought Content: Thought content normal.        Judgment: Judgment normal.      Lab Results:  CBC    Component Value Date/Time   WBC 9.4 01/05/2019 0219   RBC 5.79 (H) 01/05/2019 0219   HGB 17.3 (H) 01/05/2019 0632   HCT 51.0 (H) 01/05/2019 0632   PLT 287 01/05/2019 0219   MCV 88.1 01/05/2019 0219   MCH 27.5 01/05/2019 0219   MCHC 31.2 01/05/2019 0219   RDW 18.1 (H) 01/05/2019 0219   LYMPHSABS 2.5 01/05/2019 0219   MONOABS 0.6 01/05/2019 0219   EOSABS 0.6 (H) 01/05/2019 0219   BASOSABS 0.1 01/05/2019 0219    BMET    Component Value Date/Time   NA 140 01/06/2019 0317   K 4.5 01/06/2019 0317   CL 102 01/06/2019 0317   CO2 30 01/06/2019 0317   GLUCOSE 102 (H) 01/06/2019 0317   BUN 10 01/06/2019 0317   CREATININE 0.60 01/06/2019 0317   CALCIUM 9.0 01/06/2019 0317   GFRNONAA >60 01/06/2019 0317   GFRAA >60  01/06/2019 0317    BNP    Component Value Date/Time   BNP 161.4 (H) 01/05/2019 0219    ProBNP No results found for: PROBNP  Imaging: No results found.   Assessment & Plan:   COPD (chronic obstructive pulmonary disease) ([place]) - Reports side effects with Trelegy - Trial off inhaled steroid d/t oral candidiasis and joint pain  - Start Stiolto 2 puffs once daily, if need to add back ICS recommend HFA with spacer  - Needs to reschedule PFTs - FU in 2 weeks    Tobacco dependence due to cigarettes - Actively smoking 1/2 ppd -  Refer to Lung cancer screening clinic and pharmacy for smoking cessation  Martyn Ehrich, NP 05/23/2019

## 2019-05-24 NOTE — Progress Notes (Signed)
PCCM: Thanks for seeing her. I appreciate it.  Garner Nash, DO [facility] 05/24/2019 7:21 AM

## 2019-06-05 NOTE — Progress Notes (Signed)
Subjective Patient presents to [facility] and seen by the pharmacist for smoking cessation counseling.   Patient was referred and last seen by  Derl Barrow, NP and was last seen on 05/23/2019.  Patient states she is ready to quit smoking.  She has not smoked since 4 pm yesterday when she ran out of cigarettes.  She has quit smoking in the past for 9 days but had a stressful family event and resumed.  She currently smokes in the house and sometimes in the car it does not have a lighter.  She does not smoke at work.  Her hobbies include working in her yard, cooking, and fishing.  Social History   Tobacco Use  Smoking Status Current Every Day Smoker  . Packs/day: 0.67  . Years: 43.00  . Pack years: 28.81  . Types: Cigarettes  Smokeless Tobacco Never Used     Tobacco Use History  Age when started using tobacco on a daily basis 64 years old.  Type: cigarettes.  Number of cigarettes per day 5-6, brand Native Ultra Light.  Smokes first cigarette minutes a few hours after waking with her cup of coffee before work.  Does not wake at night to smoke  Triggers include emotional: stress, cup of coffee, around meals and drinking a beer.   Quit Attempt History   Most recent quit attempt last summer.  Longest time ever been tobacco free 9 days.   Methods tried in the past include Nicotine patch, nicotine toothpicks, nicotine gum. She has already tried Wellbutrin but made her "wired".  Rates IMPORTANCE of quitting tobacco on 1-10 scale of 10.  Rates READINESS of quitting tobacco on 1-10 scale of 10.  Rates CONFIDENCE of quitting tobacco on 1-10 scale of 10.  Motivators to quitting include being able to be more active and improve breathing; barriers include under a lot of stress now, many family members quit.   There is no immunization history on file for this patient.   Assessment and Plan  1. Smoking Cessation  Reviewed STAR Quit Plan.   S-set quit date: Patient would  like to set her quit date on her birthday which is the first day of spring on 08/11/2021 T-tell everyone: She plans to tell her family members especially her aunt who smokes.  She does not see the need to tell her coworkers that she does not smoke at work. A-anticipate challenges: She has the hardest time when she is under stress, after meals, and when she drinks coffee and/or beer. Patient picked 3 alternative activities to try instead of smoking a cigarette 1) playing with her pet 2) taking a walk 3) working around the house.  She also plans to chew hard candy after a meal and after having a drink. R- remove all tobacco products: patient will trash cigarettes, lighters, and ash trays by 08/11/2019.  Had detailed discussion about nicotine replacement and medication options for smoking cessation.  Patient is not interested in starting Wellbutrin as she had a bad experience in the past when she was placed on it in the hospital.  She declines treatment with Chantix at this time.  She has used the patch in the past and is interested in resuming.  Nicotine Patch Patient counseled on purpose, proper use, and potential adverse effects, including mild itching or redness at the point of application, headache, trouble sleeping, and/or vivid dreams     Patch Schedule for <10 cigarettes daily -Weeks 1 to 6: one nicotine patch (14 mg) daily.  I will call and re-assess how you are doing at the end of 6 weeks to see how you are doing on 14 mg patch and if you are ready to decrease dose of patch. -Weeks 7-8: one nicotine patch (7 mg) daily  She has patches at home and will double check to make sure it is the appropriate strength.  Advised that she could go ahead and start using the patch if she prefers or can wait until her follow-up appointment next week.  Also advised that she can continue to use the nicotine toothpicks as needed as well.  Will discuss nicotine gum to use for cravings on as-needed basis at next  follow-up visit.  2. Medication Reconciliation  A drug regimen assessment was performed, including review of allergies, interactions, disease-state management, dosing and immunization history. Medications were reviewed with the patient, including name, instructions, indication, goals of therapy, potential side effects, importance of adherence, and safe use.  No major drug interactions identified.  3. Inhaler optimization  During medication reconciliation discovered patient is no not using inhalers appropriately.  At last office visit she was switched from Trelegy to Christoval due to side effects of thrush, mouth and throat irritation, and muscle pain.  Patient states that her breathing worsened with Stiolto Respimat use.  She resumed Trelegy and did not experience the same side effects.  She is currently alternating between [employer] and Trelegy.  Patient states that she was previously on [facility] per another provider and that worked well.  She is unsure why she was switched to Trelegy and is unable to provide a detailed history of inhaler use.  Schedule patient for follow-up appointment next week to check inspiratory flow, inhaler technique, and potentially alter current respiratory regimen.  Advised patient to continue Stiolto until next appointment and use her albuterol as needed.  Will review chart in depth to determine prior inhaler use, side effects, and indication for discontinuation.  All questions encouraged and answered.  Instructed patient to call with any other questions or concerns.  Thank you for allowing the pharmacy team to participate in this patient's care.  This appointment required 55 minutes of patient care (this includes precharting, chart review, review of results, face-to-face care, etc.).  Mariella Saa, PharmD, [place], [place] Clinical Specialty Pharmacist [phone]  06/07/2019 8:40 AM

## 2019-06-06 ENCOUNTER — Ambulatory Visit (INDEPENDENT_AMBULATORY_CARE_PROVIDER_SITE_OTHER): Payer: Medicare Other | Admitting: Pharmacist

## 2019-06-06 ENCOUNTER — Other Ambulatory Visit: Payer: Self-pay

## 2019-06-06 DIAGNOSIS — F17210 Nicotine dependence, cigarettes, uncomplicated: Secondary | ICD-10-CM | POA: Diagnosis not present

## 2019-06-06 DIAGNOSIS — J449 Chronic obstructive pulmonary disease, unspecified: Secondary | ICD-10-CM

## 2019-06-06 NOTE — Patient Instructions (Addendum)
   Continue Stiolo 2 puffs twice daily until appointment with pharmacy team  Review smoking cessation materials and we will review at appointment next Wednesday  You can go ahead and start using the patch (14 mg) and recommend removing at night to prevent trouble sleeping

## 2019-06-11 NOTE — Progress Notes (Signed)
Subjective Patient presents today to [facility] to see pharmacy team for inhaler optimization and follow up for smoking cessation.   Pertinent past medical history includes COPD, history of tobacco abuse, and hypertension.  Respiratory medications Current: Stiolto and albuterol rescue as needed Tried in past: Trelegy (thrush/irritation), Symbicort, Ruthe Mannan, Spiriva Patient reports adherence challenges   COPD Questionnaire  CAT ASSESSMENT  Rank each of the following items on a scale of 0 to 5 (with 5 being most severe) Write a # 0-5 in each box  I never cough (0) > I cough all the time (5) 3  I have no phlegm (mucus) in my chest (0) > My chest is completely full of phlegm (mucus) (5) 5  My chest does not feel tight at all (0) > My chest feels very tight (5) 0  When I walk up a hill or one flight of stairs I am not breathless (0) > When I walk up a hill or one flight of stairs I am very breathless (5) 5  I am not limited doing any activities at home (0) > I am very limited doing activities at home (5) 5  I am confident leaving my home despite my lung function (0) > I am not at all confident leaving my home because of my lung condition (5)  0  I sleep soundly (0) > I don't sleep soundly because of my lung condition (5) 3  I have lots of energy (0) > I have no energy at all (5) 3   Total CAT Score: 19  Number of hospitilizations in the last year: 2  Number of COPD exacerbations in the last year: 2   Gold Group D   Objective Allergies  Allergen Reactions  . Crab [Shellfish Allergy] Swelling  . Erythromycin Other (See Comments)    convulsion    Outpatient Encounter Medications as of 06/13/2019  Medication Sig  . albuterol (VENTOLIN HFA) 108 (90 Base) MCG/ACT inhaler Inhale 2 puffs into the lungs every 6 (six) hours as needed for wheezing or shortness of breath.  . ALPRAZolam (XANAX) 0.5 MG tablet Take 0.5 mg by mouth at bedtime as needed for sleep.  Marland Kitchen amLODipine (NORVASC)  2.5 MG tablet Take 2.5 mg by mouth daily.  Marland Kitchen aspirin EC 81 MG tablet Take 81 mg by mouth daily.  . benazepril (LOTENSIN) 40 MG tablet Take 40 mg by mouth daily.  . cetirizine (ZYRTEC) 10 MG tablet Take 10 mg by mouth daily.  . Flaxseed, Linseed, (FLAX SEED OIL) 1000 MG CAPS Take 1,000 mg by mouth daily.  . Fluticasone-Umeclidin-Vilant (TRELEGY ELLIPTA) 100-62.5-25 MCG/INH AEPB Inhale 1 puff into the lungs daily.  . nicotine (NICODERM CQ - DOSED IN MG/24 HOURS) 14 mg/24hr patch Place 1 patch (14 mg total) onto the skin daily. (Patient not taking: Reported on 05/23/2019)  . polyethylene glycol (MIRALAX / GLYCOLAX) 17 g packet Take 17 g by mouth daily.  . Tiotropium Bromide-Olodaterol (STIOLTO RESPIMAT) 2.5-2.5 MCG/ACT AERS Inhale 2 puffs into the lungs daily. (Patient not taking: Reported on 06/06/2019)  . vitamin B-12 (CYANOCOBALAMIN) 1000 MCG tablet Take 1,000 mcg by mouth daily.   No facility-administered encounter medications on file as of 06/13/2019.      There is no immunization history on file for this patient.   PFTs FEV1: N/A  Chest X-ray Normal on 01/05/2019  Eosinophils N/A  In-Check DIAL  Inspiratory flow measured using the In-check DIAL G16 and was in range for use of Ellipta device. Patient  scored 60.  Assessment and Plan  1. Inhaler Optimization  Patient started using Stiolto daily since last visit.  She reports no improvement in her symptoms.  Patient demonstrated her inhaler technique for Stiolto.  Patient had difficulty twisting device and was not receiving full dose.  Patient is unable to demonstrate appropriate inhaler technique.  She is an appropriate candidate for DPI based off inspiratory flow measured today in office. Patient declined retrial of Trelegy at this time due to prior side effects of oral thrush and joint pain.  Recommend Anoro Ellipta.  Patient was counseled on the purpose, proper use, and adverse effects of Anoro Ellipta inhaler.    Reviewed  appropriate use of maintenance vs rescue inhalers.  Stressed importance of using maintenance inhaler daily and rescue inhaler only as needed.  Patient verbalized understanding.  Demonstrated proper inhaler technique using Anoro Ellipta demo inhaler.  Patient able to demonstrate proper inhaler technique using teach back method. Patient was given sample in office today.    Anoro Ellipta Lot: [id number] Exp: 07/2020  2. Medication Reconciliation  A drug regimen assessment was performed, including review of allergies, interactions, disease-state management, dosing and immunization history. Medications were reviewed with the patient, including name, instructions, indication, goals of therapy, potential side effects, importance of adherence, and safe use. No major drug interaction identified.  3. Immunizations  Recommend influenza, pneumonia, Shingrix and Covid-19 vaccine.  Patient has a fear of needles and apprehensive about obtaining vaccine.  Stressed the importance of these vaccines based on COPD, current smoker, and age.  Will revisit at another appointment.  4. Smoking Cessation  Patient decreased the amount she is smoking to 2 cigarettes a day since our last visit.  She states she does not smoke the whole cigarette and is starting to lose her taste for nicotine.  She is working extra shifts which has helped her decrease.  She tried the nicotine patch but discontinued after 3 days due to a burning sensation.  She switched to nicotine gum.  She currently chews half a piece of gum per day.  Patient counseled on purpose, proper use, and potential adverse effects including jaw soreness and upset stomach if swallowing saliva.  Instructed patient to use 4 mg if they smoke a pack a day or more and 2 mg if they smoke less than a pack a day.  Advised patient that she could take up to 9 pieces of gum per day every 1-2 hours.  She does not feel that she needs to use that much.  She currently smokes in between  her 2 jobs and after a meal.  Recommend chewing an additional piece of gum between jobs and after meals.  All questions encouraged and answered.  Instructed patient to reach out with further questions or concerns.  Thank you for allowing pharmacy to participate in this patient's care.  This appointment required  60 minutes of patient care (this includes precharting, chart review, review of results, face-to-face care, etc.).  Mariella Saa, PharmD, [place], [place] Clinical Specialty Pharmacist [phone]  06/13/2019 4:35 PM

## 2019-06-13 ENCOUNTER — Other Ambulatory Visit: Payer: Self-pay

## 2019-06-13 ENCOUNTER — Ambulatory Visit (INDEPENDENT_AMBULATORY_CARE_PROVIDER_SITE_OTHER): Payer: Medicare Other | Admitting: Pharmacist

## 2019-06-13 DIAGNOSIS — F17210 Nicotine dependence, cigarettes, uncomplicated: Secondary | ICD-10-CM

## 2019-06-13 DIAGNOSIS — J449 Chronic obstructive pulmonary disease, unspecified: Secondary | ICD-10-CM

## 2019-06-13 DIAGNOSIS — Z79899 Other long term (current) drug therapy: Secondary | ICD-10-CM

## 2019-06-13 MED ORDER — ANORO ELLIPTA 62.5-25 MCG/INH IN AEPB: 1.0000 | INHALATION_SPRAY | Freq: Every day | RESPIRATORY_TRACT | 0 refills | Status: DC

## 2019-06-13 NOTE — Patient Instructions (Addendum)
   Start Anoro Ellipta 1 puff daily  Continue using nicotine gum

## 2019-06-27 ENCOUNTER — Other Ambulatory Visit: Payer: Self-pay

## 2019-06-27 DIAGNOSIS — J449 Chronic obstructive pulmonary disease, unspecified: Secondary | ICD-10-CM

## 2019-06-27 MED ORDER — ANORO ELLIPTA 62.5-25 MCG/INH IN AEPB: 1.0000 | INHALATION_SPRAY | Freq: Every day | RESPIRATORY_TRACT | 4 refills | Status: DC

## 2019-06-27 MED ORDER — ALBUTEROL SULFATE HFA 108 (90 BASE) MCG/ACT IN AERS: 2.0000 | INHALATION_SPRAY | Freq: Four times a day (QID) | RESPIRATORY_TRACT | 5 refills | Status: DC | PRN

## 2019-06-28 ENCOUNTER — Ambulatory Visit: Payer: Medicare Other | Admitting: Pulmonary Disease

## 2019-07-04 ENCOUNTER — Ambulatory Visit: Payer: Medicare Other | Attending: Internal Medicine

## 2019-07-04 DIAGNOSIS — Z20822 Contact with and (suspected) exposure to covid-19: Secondary | ICD-10-CM

## 2019-07-05 LAB — NOVEL CORONAVIRUS, NAA: SARS-CoV-2, NAA: NOT DETECTED

## 2019-07-18 ENCOUNTER — Other Ambulatory Visit: Payer: Self-pay

## 2019-07-18 ENCOUNTER — Ambulatory Visit (INDEPENDENT_AMBULATORY_CARE_PROVIDER_SITE_OTHER): Payer: Medicare Other | Admitting: Pulmonary Disease

## 2019-07-18 ENCOUNTER — Encounter: Payer: Self-pay | Admitting: Pulmonary Disease

## 2019-07-18 VITALS — BP 144/82 | HR 94 | Ht 70.75 in | Wt 136.6 lb

## 2019-07-18 DIAGNOSIS — F17210 Nicotine dependence, cigarettes, uncomplicated: Secondary | ICD-10-CM

## 2019-07-18 DIAGNOSIS — J449 Chronic obstructive pulmonary disease, unspecified: Secondary | ICD-10-CM

## 2019-07-18 DIAGNOSIS — R918 Other nonspecific abnormal finding of lung field: Secondary | ICD-10-CM

## 2019-07-18 DIAGNOSIS — J432 Centrilobular emphysema: Secondary | ICD-10-CM | POA: Diagnosis not present

## 2019-07-18 DIAGNOSIS — I2510 Atherosclerotic heart disease of native coronary artery without angina pectoris: Secondary | ICD-10-CM

## 2019-07-18 DIAGNOSIS — I2584 Coronary atherosclerosis due to calcified coronary lesion: Secondary | ICD-10-CM

## 2019-07-18 MED ORDER — BREZTRI AEROSPHERE 160-9-4.8 MCG/ACT IN AERO: 2.0000 | INHALATION_SPRAY | Freq: Two times a day (BID) | RESPIRATORY_TRACT | 0 refills | Status: DC

## 2019-07-18 NOTE — Patient Instructions (Addendum)
Thank you for visiting Dr. [name] at [facility]. Today we recommend the following:  Full PFTs prior to next office visit  Samples of breztri with spacer  congrats on stopping smoking!  Return in about 8 weeks (around 09/12/2019) for with APP, Derl Barrow, NP .    Please do your part to reduce the spread of COVID-19.

## 2019-07-18 NOTE — Progress Notes (Signed)
Synopsis: Referred in September 2020 for shortness of breath by [name], MD  Subjective:   PATIENT ID: Caroline Greer GENDER: female DOB: June 18, 1955, MRN: [id number]  Chief Complaint  Patient presents with  . Follow-up    Pt states her breathing has become worse since last visit and states since beginning the anoro, she has been having to use her oxygen almost all the time. Pt also has complaints of cough with clear phlegm.    Patient with a past medical history of hypertension, problem list includes COPD.  No prior PFTs in epic.  Patient was admitted to the hospital on 01/05/2019 for COPD exacerbation.  Patient was discharged from the hospital on 01/07/2019.  Recommending outpatient pulmonary follow-up.  Admitted to the hospital on 814 with medical history of hypertension depression tobacco abuse and COPD worsening dyspnea over the past 3 days.  Started after she was mowing her grass.  Included symptoms of wheezing coughing no chest pain and no fevers.  COVID negative.  Was found to be hypoxemic oxygen saturations in the 90s on 6 L nasal cannula tachycardic.  EKG with right axis deviation no ST changes negative T waves in V1 through 4.  Patient was treated with bronchodilators systemic steroids and discharged on 3 L nasal cannula.  Patient was encouraged to use quit smoking.  Today, overall in office patient has been doing well since her hospitalization.  She has been slowly increasing her exercise tolerance.  She has been mowing her grass.  She currently lives alone.  She enjoys fishing.  She plans on going this weekend.  Unfortunately she is still smoking.  She is been trying to cut down but finds that it is very difficult.  She has been working with her insurance to help cover cost of inhalers however most inhalers have been very expensive.  She has been relying on receiving samples from her PCP.  We will also help her receive samples.  If needed we may be able to apply for financial assistance  through manufacturer.  She does have daily dyspnea on exertion cough and shortness of breath.  She uses her nebulizer regularly at home.  Denies weight loss fevers or hemoptysis.  OV 03/07/2019: Patient here today for COPD follow-up.  At last office visit patient was started on Trelegy inhaler.  Patient has been doing very well on Trelegy inhaler after start of the medication last office visit.  She needs refills of this medication today would like to have samples.  Also given refill of her albuterol inhaler.  Today we discussed her CT scan results which revealed multiple bilateral pulmonary nodules all of which were stable and benign in comparison to previous.  She does have evidence of emphysema.  Otherwise her dyspnea on exertion is doing well.  Delay in scheduling of her PFTs due to [place].  I think we can push this off until her repeat follow-up with [place] in 6 months.  Otherwise patient denies dyspnea on exertion nausea vomiting diarrhea chest pain.  No hemoptysis.  OV 07/18/2019: Patient seen today for follow-up of COPD.  Also has pulmonary nodules.  Patient last seen in the office by Derl Barrow, NP.  Was unable to get PFTs completed due to rescheduling due to Covid.  At the time had side effects from Trelegy.  This was stopped trialed off of inhaled steroids started on Stiolto.  Unfortunately at this time was still smoking.  Patient was also referred to lung cancer screening program and offered smoking  cessation counseling.  Currently managed with anoro.  She feels like she was breathing much better on the Trelegy.  She comes today hypoxemic in the room sats in the 70s.  She did not bring her home oxygen with her into the building.  We put her on oxygen here today she feels breathing much better at this time.  Of note she was able to quit smoking.  She did meet with pharmacy x2 since last office visit.  She does describe ice eating today and urged to eat ice.  She has not had a colonoscopy before.  She denies  dark stools.   Past Medical History:  Diagnosis Date  . Hypertension      Family History  Problem Relation Age of Onset  . COPD Mother      Past Surgical History:  Procedure Laterality Date  . LAPAROTOMY  1974    Social History   Socioeconomic History  . Marital status: Single    Spouse name: Not on file  . Number of children: Not on file  . Years of education: Not on file  . Highest education level: Not on file  Occupational History  . Not on file  Tobacco Use  . Smoking status: Current Every Day Smoker    Packs/day: 0.67    Years: 43.00    Pack years: 28.81    Types: Cigarettes  . Smokeless tobacco: Never Used  Substance and Sexual Activity  . Alcohol use: Yes    Comment: gets 6 beers on a weekend and drinks them  . Drug use: Not Currently    Types: Marijuana    Comment: tried to smoke some about a month ago  . Sexual activity: Not on file  Other Topics Concern  . Not on file  Social History Narrative  . Not on file   Social Determinants of Health   Financial Resource Strain:   . Difficulty of Paying Living Expenses: Not on file  Food Insecurity:   . Worried About Charity fundraiser in the Last Year: Not on file  . Ran Out of Food in the Last Year: Not on file  Transportation Needs:   . Lack of Transportation (Medical): Not on file  . Lack of Transportation (Non-Medical): Not on file  Physical Activity:   . Days of Exercise per Week: Not on file  . Minutes of Exercise per Session: Not on file  Stress:   . Feeling of Stress : Not on file  Social Connections:   . Frequency of Communication with Friends and Family: Not on file  . Frequency of Social Gatherings with Friends and Family: Not on file  . Attends Religious Services: Not on file  . Active Member of Clubs or Organizations: Not on file  . Attends Archivist Meetings: Not on file  . Marital Status: Not on file  Intimate Partner Violence:   . Fear of Current or Ex-Partner: Not on  file  . Emotionally Abused: Not on file  . Physically Abused: Not on file  . Sexually Abused: Not on file     Allergies  Allergen Reactions  . Crab [Shellfish Allergy] Swelling  . Erythromycin Other (See Comments)    convulsion     Outpatient Medications Prior to Visit  Medication Sig Dispense Refill  . albuterol (VENTOLIN HFA) 108 (90 Base) MCG/ACT inhaler Inhale 2 puffs into the lungs every 6 (six) hours as needed for wheezing or shortness of breath. 8 g 5  . ALPRAZolam (  XANAX) 0.5 MG tablet Take 0.5 mg by mouth at bedtime as needed for sleep.    Marland Kitchen amLODipine (NORVASC) 2.5 MG tablet Take 2.5 mg by mouth daily.    Marland Kitchen aspirin EC 81 MG tablet Take 81 mg by mouth daily.    . benazepril (LOTENSIN) 40 MG tablet Take 40 mg by mouth daily.    Marland Kitchen ipratropium-albuterol (DUONEB) 0.5-2.5 (3) MG/3ML SOLN Take 3 mLs by nebulization 4 (four) times daily.    . Multiple Vitamins-Minerals (CENTRUM SILVER 50+WOMEN) TABS Take 1 tablet by mouth daily.    Marland Kitchen omega-3 fish oil (MAXEPA) 1000 MG CAPS capsule Take 1 capsule by mouth daily.    . polyethylene glycol (MIRALAX / GLYCOLAX) 17 g packet Take 17 g by mouth daily. 14 each 0  . umeclidinium-vilanterol (ANORO ELLIPTA) 62.5-25 MCG/INH AEPB Inhale 1 puff into the lungs daily. 60 each 4  . vitamin B-12 (CYANOCOBALAMIN) 1000 MCG tablet Take 1,000 mcg by mouth daily.    . Fluticasone-Umeclidin-Vilant (TRELEGY ELLIPTA) 100-62.5-25 MCG/INH AEPB Inhale 1 puff into the lungs daily. (Patient not taking: Reported on 06/13/2019) 1 each 11  . Flaxseed, Linseed, (FLAX SEED OIL) 1000 MG CAPS Take 1,000 mg by mouth daily.     No facility-administered medications prior to visit.    Review of Systems  Constitutional: Negative for chills, fever, malaise/fatigue and weight loss.       Pica  HENT: Negative for hearing loss, sore throat and tinnitus.   Eyes: Negative for blurred vision and double vision.  Respiratory: Positive for cough and shortness of breath. Negative for  hemoptysis, sputum production, wheezing and stridor.   Cardiovascular: Negative for chest pain, palpitations, orthopnea, leg swelling and PND.  Gastrointestinal: Negative for abdominal pain, constipation, diarrhea, heartburn, nausea and vomiting.  Genitourinary: Negative for dysuria, hematuria and urgency.  Musculoskeletal: Negative for joint pain and myalgias.  Skin: Negative for itching and rash.  Neurological: Negative for dizziness, tingling, weakness and headaches.  Endo/Heme/Allergies: Negative for environmental allergies. Does not bruise/bleed easily.  Psychiatric/Behavioral: Negative for depression. The patient is not nervous/anxious and does not have insomnia.   All other systems reviewed and are negative.    Objective:  Physical Exam Vitals reviewed.  Constitutional:      General: She is not in acute distress.    Appearance: She is well-developed.  HENT:     Head: Normocephalic and atraumatic.     Mouth/Throat:     Pharynx: No oropharyngeal exudate.  Eyes:     Conjunctiva/sclera: Conjunctivae normal.     Pupils: Pupils are equal, round, and reactive to light.  Neck:     Vascular: No JVD.     Trachea: No tracheal deviation.     Comments: Loss of supraclavicular fat Cardiovascular:     Rate and Rhythm: Normal rate and regular rhythm.     Heart sounds: S1 normal and S2 normal.     Comments: Distant heart tones Pulmonary:     Effort: No tachypnea or accessory muscle usage.     Breath sounds: No stridor. Decreased breath sounds (throughout all lung fields) present. No wheezing, rhonchi or rales.  Abdominal:     General: Bowel sounds are normal. There is no distension.     Palpations: Abdomen is soft.     Tenderness: There is no abdominal tenderness.  Musculoskeletal:        General: Deformity (muscle wasting ) present.  Skin:    General: Skin is warm and dry.     Capillary  Refill: Capillary refill takes less than 2 seconds.     Findings: No rash.  Neurological:      Mental Status: She is alert and oriented to person, place, and time.  Psychiatric:        Behavior: Behavior normal.      Vitals:   07/18/19 1542 07/18/19 1545  BP: (!) 144/82   Pulse:  94  SpO2: (!) 77% 91%  Weight: 136 lb 9.6 oz (62 kg)   Height: 5' 10.75" (1.797 m)    91% on RA BMI Readings from Last 3 Encounters:  07/18/19 19.19 kg/m  05/23/19 19.66 kg/m  03/07/19 19.66 kg/m   Wt Readings from Last 3 Encounters:  07/18/19 136 lb 9.6 oz (62 kg)  05/23/19 137 lb (62.1 kg)  03/07/19 140 lb (63.5 kg)     CBC    Component Value Date/Time   WBC 9.4 01/05/2019 0219   RBC 5.79 (H) 01/05/2019 0219   HGB 17.3 (H) 01/05/2019 0632   HCT 51.0 (H) 01/05/2019 0632   PLT 287 01/05/2019 0219   MCV 88.1 01/05/2019 0219   MCH 27.5 01/05/2019 0219   MCHC 31.2 01/05/2019 0219   RDW 18.1 (H) 01/05/2019 0219   LYMPHSABS 2.5 01/05/2019 0219   MONOABS 0.6 01/05/2019 0219   EOSABS 0.6 (H) 01/05/2019 0219   BASOSABS 0.1 01/05/2019 0219    Chest Imaging: CXR - 8/14 Cardiomegaly, evidence of emphysema  The patient's images have been independently reviewed by me.   09/30/2018 CT chest: Multiple bilateral pulmonary nodules largest 6 mm the right upper lobe recommending noncontrast CT follow-up 3 to 6 months. The patient's images have been independently reviewed by me.    CT chest 02/22/2019: Previously noted pulmonary nodules decreased in size considered benign. Diffuse bronchial wall thickening consistent with COPD history. Three-vessel coronary disease. The patient's images have been independently reviewed by me.    Pulmonary Functions Testing Results: No flowsheet data found.  FeNO: None   Pathology: None   Echocardiogram: None   Heart Catheterization: None     Assessment & Plan:     ICD-10-CM   1. Chronic obstructive pulmonary disease, unspecified COPD type ([place])  J44.9   2. Tobacco dependence due to cigarettes  F17.210   3. Centrilobular emphysema ([place])  J43.2    4. Multiple lung nodules  R91.8     Assessment:   This is a 64 year old female only with a history of COPD.  No prior PFTs on file.  Longstanding history of tobacco abuse.  Unfortunately still smoking.  Was started on Trelegy inhaler however started to have oral candidiasis.  Decision was made to trial off of the ICS and switch to [employer].  She did not like Stiolto switched back to [employer].  Still not doing well on Anoro.  Still with significant dyspnea on exertion.  She was doing better on triple therapy.  Fortunately she has been able to quit smoking since last office visit.  Plan Following Extensive Data Review & Interpretation:  . I reviewed prior external note(s) from pharmacy Dr. Toribio Harbour smoking cessation counseling. . I reviewed the result(s) of prior hemoglobin 17-18 . I have ordered samples for breztri with spacer. Already planned evaluation in lung cancer screening program started.  Repeat imaging will need to occur in October 2021. Full pulmonary function test  Independent interpretation of tests . Review of patient's 02/22/2019 CT chest images revealed previously noted small nodules decreased in size considered benign, bronchial wall thickening and centrilobular and paraseptal  emphysema consistent with COPD history.  Three-vessel calcified coronary disease. the patient's images have been independently reviewed by me.    Referral placed to cardiology for evaluation of coronary calcification on CT imaging.  Return to clinic in approximately 6 to 8 weeks once PFTs are complete.  Can be seen by myself or Derl Barrow, NP.    Current Outpatient Medications:  .  albuterol (VENTOLIN HFA) 108 (90 Base) MCG/ACT inhaler, Inhale 2 puffs into the lungs every 6 (six) hours as needed for wheezing or shortness of breath., Disp: 8 g, Rfl: 5 .  ALPRAZolam (XANAX) 0.5 MG tablet, Take 0.5 mg by mouth at bedtime as needed for sleep., Disp: , Rfl:  .  amLODipine (NORVASC) 2.5 MG tablet, Take 2.5 mg by  mouth daily., Disp: , Rfl:  .  aspirin EC 81 MG tablet, Take 81 mg by mouth daily., Disp: , Rfl:  .  benazepril (LOTENSIN) 40 MG tablet, Take 40 mg by mouth daily., Disp: , Rfl:  .  ipratropium-albuterol (DUONEB) 0.5-2.5 (3) MG/3ML SOLN, Take 3 mLs by nebulization 4 (four) times daily., Disp: , Rfl:  .  Multiple Vitamins-Minerals (CENTRUM SILVER 50+WOMEN) TABS, Take 1 tablet by mouth daily., Disp: , Rfl:  .  omega-3 fish oil (MAXEPA) 1000 MG CAPS capsule, Take 1 capsule by mouth daily., Disp: , Rfl:  .  polyethylene glycol (MIRALAX / GLYCOLAX) 17 g packet, Take 17 g by mouth daily., Disp: 14 each, Rfl: 0 .  umeclidinium-vilanterol (ANORO ELLIPTA) 62.5-25 MCG/INH AEPB, Inhale 1 puff into the lungs daily., Disp: 60 each, Rfl: 4 .  vitamin B-12 (CYANOCOBALAMIN) 1000 MCG tablet, Take 1,000 mcg by mouth daily., Disp: , Rfl:  .  Fluticasone-Umeclidin-Vilant (TRELEGY ELLIPTA) 100-62.5-25 MCG/INH AEPB, Inhale 1 puff into the lungs daily. (Patient not taking: Reported on 06/13/2019), Disp: 1 each, Rfl: [place], DO [facility] 07/18/2019 3:49 PM

## 2019-07-23 ENCOUNTER — Other Ambulatory Visit: Payer: Self-pay

## 2019-07-23 ENCOUNTER — Encounter: Payer: Self-pay | Admitting: Cardiology

## 2019-07-23 ENCOUNTER — Ambulatory Visit: Payer: Medicare Other | Admitting: Cardiology

## 2019-07-23 ENCOUNTER — Telehealth: Payer: Self-pay | Admitting: Adult Health

## 2019-07-23 ENCOUNTER — Encounter (INDEPENDENT_AMBULATORY_CARE_PROVIDER_SITE_OTHER): Payer: Self-pay

## 2019-07-23 VITALS — BP 138/74 | HR 92 | Ht 70.5 in | Wt 136.2 lb

## 2019-07-23 DIAGNOSIS — I359 Nonrheumatic aortic valve disorder, unspecified: Secondary | ICD-10-CM

## 2019-07-23 DIAGNOSIS — I700 Atherosclerosis of aorta: Secondary | ICD-10-CM | POA: Diagnosis not present

## 2019-07-23 DIAGNOSIS — Z720 Tobacco use: Secondary | ICD-10-CM

## 2019-07-23 DIAGNOSIS — I2510 Atherosclerotic heart disease of native coronary artery without angina pectoris: Secondary | ICD-10-CM

## 2019-07-23 DIAGNOSIS — R0602 Shortness of breath: Secondary | ICD-10-CM

## 2019-07-23 DIAGNOSIS — I10 Essential (primary) hypertension: Secondary | ICD-10-CM | POA: Diagnosis not present

## 2019-07-23 DIAGNOSIS — R072 Precordial pain: Secondary | ICD-10-CM

## 2019-07-23 MED ORDER — METOPROLOL TARTRATE 100 MG PO TABS: 100.0000 mg | ORAL_TABLET | Freq: Once | ORAL | 0 refills | Status: DC

## 2019-07-23 NOTE — Progress Notes (Signed)
Cardiology Office Consult Note    Date:  07/23/2019   ID:  Caroline Greer, DOB 1956/02/08, MRN [id number]  PCP:  Caroline Panda, MD  Cardiologist:  Caroline Him, MD   Chief Complaint  Patient presents with  . New Patient (Initial Visit)    Coronary artery calcifications    History of Present Illness:  Caroline Greer is a 64 y.o. female who is being seen today for the evaluation of coronary artery calcifications at the request of [place], [place], DO.  This is a 64yo female with a hx of COPD on home O2, HTN and ongoing tobacco abuse who had routine lung CA screening CT which showed coronary calcifications of all 3 coronary arteries including the LM as well as aortic atherosclerosis and AV calcifications  and is now referred for further evaluation.    She has fairly significant COPD and has been on O2 as needed when she is doing exertional activities.  She says that about 4 years ago she thought she had an MI.  She was walking back to her car from watching a parade and started feeling her heart pounding and then had chest pressure.  She stopped and rested and it resolved.  She has had worsening DOE recently and saw pulmonary and meds were changed.  She does not wear her O2 24/7.   She denies any   PND, orthopnea, LE edema, dizziness, palpitations or syncope. She is compliant with her meds and is tolerating meds with no SE.  She tells me that she does get increased DOE recently with wakling to the mailbox and sometime her chest will hurt but resolves with rest and O2 for about 30 minutes.     Past Medical History:  Diagnosis Date  . Acute respiratory failure with hypoxia ([place]) 09/30/2018  . Constipation   . COPD exacerbation ([place]) 01/05/2019  . Depression with anxiety 09/30/2018  . Essential hypertension 09/30/2018  . Hypertension   . Tobacco dependence due to cigarettes 09/30/2018    Past Surgical History:  Procedure Laterality Date  . LAPAROTOMY  1974    Current Medications: Current Meds   Medication Sig  . albuterol (VENTOLIN HFA) 108 (90 Base) MCG/ACT inhaler Inhale 2 puffs into the lungs every 6 (six) hours as needed for wheezing or shortness of breath.  . ALPRAZolam (XANAX) 0.5 MG tablet Take 0.5 mg by mouth at bedtime as needed for sleep.  Marland Kitchen amLODipine (NORVASC) 2.5 MG tablet Take 2.5 mg by mouth daily.  Marland Kitchen aspirin EC 81 MG tablet Take 81 mg by mouth daily.  . benazepril (LOTENSIN) 40 MG tablet Take 40 mg by mouth daily.  . Budeson-Glycopyrrol-Formoterol (BREZTRI AEROSPHERE) 160-9-4.8 MCG/ACT AERO Inhale 2 puffs into the lungs in the morning and at bedtime.  Marland Kitchen ipratropium-albuterol (DUONEB) 0.5-2.5 (3) MG/3ML SOLN Take 3 mLs by nebulization 4 (four) times daily.  . Multiple Vitamins-Minerals (CENTRUM SILVER 50+WOMEN) TABS Take 1 tablet by mouth daily.  Marland Kitchen omega-3 fish oil (MAXEPA) 1000 MG CAPS capsule Take 1 capsule by mouth daily.  . polyethylene glycol (MIRALAX / GLYCOLAX) 17 g packet Take 17 g by mouth daily.  . vitamin B-12 (CYANOCOBALAMIN) 1000 MCG tablet Take 1,000 mcg by mouth daily.    Allergies:   Crab [shellfish allergy] and Erythromycin   Social History   Socioeconomic History  . Marital status: Single    Spouse name: Not on file  . Number of children: Not on file  . Years of education: Not on file  .  Highest education level: Not on file  Occupational History  . Not on file  Tobacco Use  . Smoking status: Current Every Day Smoker    Packs/day: 0.67    Years: 43.00    Pack years: 28.81    Types: Cigarettes  . Smokeless tobacco: Never Used  Substance and Sexual Activity  . Alcohol use: Yes    Comment: gets 6 beers on a weekend and drinks them  . Drug use: Not Currently    Types: Marijuana    Comment: tried to smoke some about a month ago  . Sexual activity: Not on file  Other Topics Concern  . Not on file  Social History Narrative  . Not on file   Social Determinants of Health   Financial Resource Strain:   . Difficulty of Paying Living  Expenses: Not on file  Food Insecurity:   . Worried About Charity fundraiser in the Last Year: Not on file  . Ran Out of Food in the Last Year: Not on file  Transportation Needs:   . Lack of Transportation (Medical): Not on file  . Lack of Transportation (Non-Medical): Not on file  Physical Activity:   . Days of Exercise per Week: Not on file  . Minutes of Exercise per Session: Not on file  Stress:   . Feeling of Stress : Not on file  Social Connections:   . Frequency of Communication with Friends and Family: Not on file  . Frequency of Social Gatherings with Friends and Family: Not on file  . Attends Religious Services: Not on file  . Active Member of Clubs or Organizations: Not on file  . Attends Archivist Meetings: Not on file  . Marital Status: Not on file     Family History:  The patient's family history includes COPD in her mother.   ROS:   Please see the history of present illness.    ROS All other systems reviewed and are negative.  No flowsheet data found.     PHYSICAL EXAM:   VS:  BP 138/74   Pulse 92   Ht 5' 10.5" (1.791 m)   Wt 136 lb 3.2 oz (61.8 kg)   SpO2 (!) 86%   BMI 19.27 kg/m    GEN: Well nourished, well developed, in no acute distress  HEENT: normal  Neck: no JVD, carotid bruits, or masses Cardiac: RRR; no murmurs, rubs, or gallops,no edema.  Intact distal pulses bilaterally.  Respiratory:  clear to auscultation bilaterally, normal work of breathing GI: soft, nontender, nondistended, + BS MS: no deformity or atrophy  Skin: warm and dry, no rash Neuro:  Alert and Oriented x 3, Strength and sensation are intact Psych: euthymic mood, full affect  Wt Readings from Last 3 Encounters:  07/23/19 136 lb 3.2 oz (61.8 kg)  07/18/19 136 lb 9.6 oz (62 kg)  05/23/19 137 lb (62.1 kg)      Studies/Labs Reviewed:   EKG:  EKG is not ordered today.    Recent Labs: 09/30/2018: ALT 18 01/05/2019: B Natriuretic Peptide 161.4; Hemoglobin 17.3;  Platelets 287 01/06/2019: BUN 10; Creatinine, Ser 0.60; Potassium 4.5; Sodium 140   Lipid Panel No results found for: CHOL, TRIG, HDL, CHOLHDL, VLDL, LDLCALC, LDLDIRECT  Additional studies/ records that were reviewed today include:  Office notes from PCP, Chest CTA    ASSESSMENT:    1. Coronary artery calcification seen on CAT scan   2. Essential hypertension   3. Tobacco abuse  4. Aortic atherosclerosis ([place])   5. Aortic valve calcification      PLAN:  In order of problems listed above:  1.  Coronary artery calcifications -she denies any anginal sx -will get a coronary Ca score to help determine future risk -if calcium score high then may consider Lexiscan myoview to rule out ischemia -check FLP   2.  HTN -BP controlled -continue amlodipine 2.5mg  daily and Benazepril 40mg  daily  3.  Ongoing tobacco abuse -tobacco cessation counseling was provided for 5 minutes  4.  Aortic atherosclerosis -noted on Chest CT with no evidence of aortic aneurysm -BP controlled -check FLP and ALT  5.  AV leaflet calcifications -noted on Chest CT -will get a 2D echo to assess further  6.  COPD -O2 sats in the office today were 86% on RA and she was not wearing her O2 and has been having worsening SOB -She only wears her O2 when she is doing exertional things -O2 was placed on her in the office at 3L during her OV and O2 sats came up to % 94 -I have spoken with Dr. Fritz Pickerel  PA at Pulmonary on the phone today to let them know about the hypoxia and they recommended    Medication Adjustments/Labs and Tests Ordered: Current medicines are reviewed at length with the patient today.  Concerns regarding medicines are outlined above.  Medication changes, Labs and Tests ordered today are listed in the Patient Instructions below.  There are no Patient Instructions on file for this visit.   Signed, Caroline Him, MD  07/23/2019 2:27 PM    [facility] [place], [place], [place]  [address] Phone: [phone]; Fax: [phone]

## 2019-07-23 NOTE — Telephone Encounter (Signed)
lmtcb X1 for pt  

## 2019-07-23 NOTE — Telephone Encounter (Signed)
Phone call from Dr. Radford Pax.  Patient was present for office visit today and did not bring oxygen and O2 saturations were in the mid 80s on room air.  Is supposed to be on oxygen 3 L.  Patient was placed on oxygen at Dr. Theodosia Blender office at 3 L/minute O2 saturations 94%  Please reach out to patient to see if she has portable oxygen and reach out to DME to see if she is current with her oxygen supplies as she needs to wear her oxygen on a regular basis  CC : Dr. [name] for [facility]

## 2019-07-23 NOTE — Patient Instructions (Signed)
Medication Instructions:  Your physician recommends that you continue on your current medications as directed. Please refer to the Current Medication list given to you today.  *If you need a refill on your cardiac medications before your next appointment, please call your pharmacy*   Lab Work: BMET prior to CT scan.  If you have labs (blood work) drawn today and your tests are completely normal, you will receive your results only by: Marland Kitchen MyChart Message (if you have MyChart) OR . A paper copy in the mail If you have any lab test that is abnormal or we need to change your treatment, we will call you to review the results.   Testing/Procedures: Your physician has requested that you have cardiac CT. Cardiac computed tomography (CT) is a painless test that uses an x-ray machine to take clear, detailed pictures of your heart. For further information please visit [email or web address]. Please follow instruction sheet as given.  Your physician has requested that you have an echocardiogram. Echocardiography is a painless test that uses sound waves to create images of your heart. It provides your doctor with information about the size and shape of your heart and how well your heart's chambers and valves are working. This procedure takes approximately one hour. There are no restrictions for this procedure.   Follow-Up: At [facility], you and your health needs are our priority.  As part of our continuing mission to provide you with exceptional heart care, we have created designated Provider Care Teams.  These Care Teams include your primary Cardiologist (physician) and Advanced Practice Providers (APPs -  Physician Assistants and Nurse Practitioners) who all work together to provide you with the care you need, when you need it.  We recommend signing up for the patient portal called "MyChart".  Sign up information is provided on this After Visit Summary.  MyChart is used to connect with patients for  Virtual Visits (Telemedicine).  Patients are able to view lab/test results, encounter notes, upcoming appointments, etc.  Non-urgent messages can be sent to your provider as well.   To learn more about what you can do with MyChart, go to [email or web address].    Follow up with Dr. Radford Pax as needed based on results of testing.    Other Instructions Your cardiac CT will be scheduled at one of the below locations:   [facility] [address] [place],  [address] [phone]  Please arrive at the [facility] main entrance of [facility] 30 minutes prior to test start time. Proceed to the [facility] Radiology Department (first floor) to check-in and test prep.  Please follow these instructions carefully (unless otherwise directed):  On the Day of the Test: . Drink plenty of water. Do not drink any water within one hour of the test. . Do not eat any food 4 hours prior to the test. . You may take your regular medications prior to the test.  . Take metoprolol (Lopressor) two hours prior to test. . FEMALES- please wear underwire-free bra if available       After the Test: . Drink plenty of water. . After receiving IV contrast, you may experience a mild flushed feeling. This is normal. . On occasion, you may experience a mild rash up to 24 hours after the test. This is not dangerous. If this occurs, you can take Benadryl 25 mg and increase your fluid intake. . If you experience trouble breathing, this can be serious. If it is severe call 911 IMMEDIATELY.  If it is mild, please call our office. . If you take any of these medications: Glipizide/Metformin, Avandament, Glucavance, please do not take 48 hours after completing test unless otherwise instructed.   Once we have confirmed authorization from your insurance company, we will call you to set up a date and time for your test.   For non-scheduling related questions, please contact the cardiac imaging nurse  navigator should you have any questions/concerns: Marchia Bond, RN Navigator Cardiac Imaging [facility] [phone] mobile

## 2019-07-24 NOTE — Telephone Encounter (Signed)
Agree. Thank you TP.  Garner Nash, DO [facility] 07/24/2019 7:20 AM

## 2019-07-24 NOTE — Telephone Encounter (Signed)
Called the patient and confirmed her DME is [employer]. Patient stated that she does have the large concentrator and a portable tank at home, using 3.   Patient stated she has been getting bills for different amounts and cannot afford to pay them. Feels that her oxygen will end up being picked up. I told the patient I would contact someone at [employer] to look into it,.  Patient voiced understanding and gave thanks for the help.   I called  Melissa with [employer] and advised her of the patient situation. Melissa stated she will contact the billing department so they can reach out to the patient, as I could not confirm if the patient actually tried to call [employer] to work out payment arrangements or not.  Nothing further needed.

## 2019-08-08 ENCOUNTER — Other Ambulatory Visit: Payer: Self-pay

## 2019-08-08 ENCOUNTER — Ambulatory Visit (HOSPITAL_COMMUNITY): Payer: Medicare Other | Attending: Internal Medicine

## 2019-08-08 ENCOUNTER — Other Ambulatory Visit: Payer: Medicare Other

## 2019-08-08 DIAGNOSIS — I359 Nonrheumatic aortic valve disorder, unspecified: Secondary | ICD-10-CM

## 2019-08-22 ENCOUNTER — Telehealth (HOSPITAL_COMMUNITY): Payer: Self-pay | Admitting: Emergency Medicine

## 2019-08-22 NOTE — Telephone Encounter (Signed)
Thank you :)

## 2019-08-22 NOTE — Telephone Encounter (Signed)
Calling patient to review cardiac CT instructions. Pt states she thought her appt was for 3:30p instead of 2:30p. Pt gets off work at 2p and cannot make tomorrows appt. Pt wishes to r/s. I gave her [place] phone number to reach out and r/s at a later date.  Marchia Bond RN Navigator Cardiac Imaging [facility] [phone] Office  [phone] Cell

## 2019-08-23 ENCOUNTER — Ambulatory Visit (HOSPITAL_COMMUNITY): Payer: Medicare Other

## 2019-09-07 ENCOUNTER — Ambulatory Visit (HOSPITAL_COMMUNITY): Payer: Medicare Other

## 2019-09-19 ENCOUNTER — Telehealth (HOSPITAL_COMMUNITY): Payer: Self-pay | Admitting: Emergency Medicine

## 2019-09-19 MED ORDER — METOPROLOL TARTRATE 100 MG PO TABS: 100.0000 mg | ORAL_TABLET | Freq: Once | ORAL | 0 refills | Status: DC

## 2019-09-19 NOTE — Telephone Encounter (Signed)
Reaching out to patient to offer assistance regarding upcoming cardiac imaging study; pt verbalizes understanding of appt date/time, parking situation and where to check in, pre-test NPO status and medications ordered, and verified current allergies; name and call back number provided for further questions should they arise Marchia Bond RN [facility] [phone] office [phone] cell  Resubmitting metoprolol for pharmacy to prepare. I explained to patient what med was for. Pt verbalized understanding.   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 Encouraged patient to take PRN xanax AS LONG AS SHE HAS A RIDE AVAILABLE. Pt verbalized understanding.   Phone number provided for further questions.  Clarise Cruz

## 2019-09-20 ENCOUNTER — Other Ambulatory Visit: Payer: Self-pay

## 2019-09-20 ENCOUNTER — Ambulatory Visit (HOSPITAL_COMMUNITY)
Admission: RE | Admit: 2019-09-20 | Discharge: 2019-09-20 | Disposition: A | Discharge status: Home / Self Care | Payer: Medicare Other | Source: Ambulatory Visit | Attending: Cardiology | Admitting: Cardiology

## 2019-09-20 DIAGNOSIS — I2510 Atherosclerotic heart disease of native coronary artery without angina pectoris: Secondary | ICD-10-CM | POA: Diagnosis not present

## 2019-09-20 DIAGNOSIS — R0602 Shortness of breath: Secondary | ICD-10-CM | POA: Diagnosis present

## 2019-09-20 DIAGNOSIS — R072 Precordial pain: Secondary | ICD-10-CM | POA: Insufficient documentation

## 2019-09-20 MED ORDER — NITROGLYCERIN 0.4 MG SL SUBL
SUBLINGUAL_TABLET | SUBLINGUAL | Status: AC
  Administered 2019-09-20: 0.8 mg via SUBLINGUAL
  Filled 2019-09-20: qty 2

## 2019-09-20 MED ORDER — IOHEXOL 350 MG/ML SOLN
100.0000 mL | Freq: Once | INTRAVENOUS | Status: AC | PRN
  Administered 2019-09-20: 100 mL via INTRAVENOUS

## 2019-09-20 MED ORDER — NITROGLYCERIN 0.4 MG SL SUBL: 0.8000 mg | SUBLINGUAL_TABLET | Freq: Once | SUBLINGUAL | Status: AC

## 2019-09-21 ENCOUNTER — Other Ambulatory Visit: Payer: Self-pay | Admitting: *Deleted

## 2019-09-21 MED ORDER — ALBUTEROL SULFATE HFA 108 (90 BASE) MCG/ACT IN AERS: 2.0000 | INHALATION_SPRAY | Freq: Four times a day (QID) | RESPIRATORY_TRACT | 5 refills | Status: DC | PRN

## 2019-09-26 ENCOUNTER — Encounter: Payer: Self-pay | Admitting: Cardiology

## 2019-09-26 ENCOUNTER — Ambulatory Visit (INDEPENDENT_AMBULATORY_CARE_PROVIDER_SITE_OTHER): Payer: Medicare Other | Admitting: Cardiology

## 2019-09-26 VITALS — BP 152/94 | HR 96 | Ht 70.5 in | Wt 135.5 lb

## 2019-09-26 DIAGNOSIS — I2510 Atherosclerotic heart disease of native coronary artery without angina pectoris: Secondary | ICD-10-CM | POA: Diagnosis not present

## 2019-09-26 DIAGNOSIS — Z720 Tobacco use: Secondary | ICD-10-CM

## 2019-09-26 DIAGNOSIS — J449 Chronic obstructive pulmonary disease, unspecified: Secondary | ICD-10-CM | POA: Diagnosis not present

## 2019-09-26 DIAGNOSIS — I10 Essential (primary) hypertension: Secondary | ICD-10-CM | POA: Diagnosis not present

## 2019-09-26 MED ORDER — METOPROLOL TARTRATE 25 MG PO TABS: 25.0000 mg | ORAL_TABLET | Freq: Two times a day (BID) | ORAL | 3 refills | Status: DC

## 2019-09-26 MED ORDER — ATORVASTATIN CALCIUM 10 MG PO TABS: 10.0000 mg | ORAL_TABLET | Freq: Every day | ORAL | 3 refills | Status: DC

## 2019-09-26 NOTE — Patient Instructions (Addendum)
Medication Instructions:  Your physician has recommended you make the following change in your medication:  1.  START Atorvastatin 10 mg taking 1 daily 2.  START Lopressor 25 mg taking 1 twice a day   *If you need a refill on your cardiac medications before your next appointment, please call your pharmacy*   Lab Work: TODAY:  BMET & CBC  If you have labs (blood work) drawn today and your tests are completely normal, you will receive your results only by: Marland Kitchen MyChart Message (if you have MyChart) OR . A paper copy in the mail If you have any lab test that is abnormal or we need to change your treatment, we will call you to review the results.   Testing/Procedures: Your physician has requested that you have a cardiac catheterization. Cardiac catheterization is used to diagnose and/or treat various heart conditions. Doctors may recommend this procedure for a number of different reasons. The most common reason is to evaluate chest pain. Chest pain can be a symptom of coronary artery disease (CAD), and cardiac catheterization can show whether plaque is narrowing or blocking your heart's arteries. This procedure is also used to evaluate the valves, as well as measure the blood flow and oxygen levels in different parts of your heart. For further information please visit [email or web address]. Please follow instruction sheet, BELOW:     Caroline Greer OFFICE [place], [place] [address] Dept: [phone] Loc: [phone]  Caroline Greer  09/26/2019  You are scheduled for a Cardiac Catheterization on Tuesday, May 11 with Dr. Sherren Mocha.  1. Please arrive at the [facility] (Main Entrance A) at [facility]: [place], [place] [address] at 6:00 AM (This time is two hours before your procedure to ensure your preparation). Free valet parking service is available.   Special note:  Every effort is made to have your procedure done on time. Please understand that emergencies sometimes delay scheduled procedures.  2. Diet: Do not eat solid foods after midnight.  The patient may have clear liquids until 5am upon the day of the procedure.  3. Labs: You will need to have blood drawn on TODAY, BUT YOU WILL NEED TO GO TO [facility] [place], [place], ON Saturday, 09/29/2019 FOR COVID TESTING.  ONCE YOU ARE SWABBED, YOU WILL BE REQUIRED TO GO STRAIGHT HOME AND QUARANTINE.   4. Medication instructions in preparation for your procedure:   Contrast Allergy: No    On the morning of your procedure, take your Aspirin and any morning medicines NOT listed above.  You may use sips of water.  5. Plan for one night stay--bring personal belongings. 6. Bring a current list of your medications and current insurance cards. 7. You MUST have a responsible person to drive you home. 8. Someone MUST be with you the first 24 hours after you arrive home or your discharge will be delayed. 9. Please wear clothes that are easy to get on and off and wear slip-on shoes.  Thank you for allowing [place] to care for you!   -- [facility]      Follow-Up: At [facility], you and your health needs are our priority.  As part of our continuing mission to provide you with exceptional heart care, we have created designated Provider Care Teams.  These Care Teams include your primary Cardiologist (physician) and Advanced Practice Providers (  APPs -  Physician Assistants and Nurse Practitioners) who all work together to provide you with the care you need, when you need it.  We recommend signing up for the patient portal called "MyChart".  Sign up information is provided on this After Visit Summary.  MyChart is used to connect with patients for Virtual Visits (Telemedicine).  Patients are able to view lab/test results, encounter notes, upcoming  appointments, etc.  Non-urgent messages can be sent to your provider as well.   To learn more about what you can do with MyChart, go to [email or web address].    Your next appointment:   2 week(s)  10/16/19 ARRIVE AT 3:00   The format for your next appointment:   In Person  Provider:   You may see Dr. [name] or one of the following Advanced Practice Providers on your designated Care Team:    Melina Copa, PA-C  Ermalinda Barrios, PA-C    Other Instructions

## 2019-09-26 NOTE — Progress Notes (Signed)
Cardiology Office Note   Date:  09/26/2019   ID:  Caroline Greer, DOB 31-Mar-1956, MRN [id number]  PCP:  [name], MD  Cardiologist:  Dr. Radford Pax    Chief Complaint  Patient presents with  . Coronary Artery Disease      History of Present Illness: Caroline Greer is a 64 y.o. female who presents for post cardiac CTA and with CAD recommendation for cardiac cath.    She has a hx of COPD on home O2, HTN and ongoing tobacco abuse who had routine lung CA screening CT which showed coronary calcifications of all 3 coronary arteries including the LM as well as aortic atherosclerosis and AV calcifications  and is now referred for further evaluation.    She has fairly significant COPD and has been on O2 as needed when she is doing exertional activities.  She says that about 4 years ago she thought she had an MI.  She was walking back to her car from watching a parade and started feeling her heart pounding and then had chest pressure.  She stopped and rested and it resolved.  She has had worsening DOE recently and saw pulmonary and meds were changed.  She does not wear her O2 24/7.   She denies any   PND, orthopnea, LE edema, dizziness, palpitations or syncope. She is compliant with her meds and is tolerating meds with no SE.  She tells me that she does get increased DOE recently with wakling to the mailbox and sometime her chest will hurt but resolves with rest and O2 for about 30 minutes.   + tobacco    mRCA with 70-99%   There is severe calcified plaque in the mid LAD with associated stenosis of 70-99%.  There is moderate calcified plaque in the proximal LCx at the takeoff of a large OM1 with associated stenosis of 50-69%.  ADDENDUM: Not mentioned in the above addendum impression is enlargement of the pulmonary outflow tract, suggesting pulmonary arterial hypertension.  She is not having chest pain now.  And she feels her breathing is better with breztri aerosphere.  She is anxious about  procedures.   We discussed cath and reason for having and description.  It is planned for next week.  We will ask her to cancel her PFTs for next week and do cardiac cath.   Will adjust her meds.  Reviewed her lipid panel and BMP from PCP office and in March this year her LDL was 55 and HDL 79.  Cr 0.60 and BUN 11.   She is on ASA 81 mg daily.    Past Medical History:  Diagnosis Date  . Acute respiratory failure with hypoxia ([place]) 09/30/2018  . Constipation   . COPD exacerbation ([place]) 01/05/2019  . Depression with anxiety 09/30/2018  . Essential hypertension 09/30/2018  . Hypertension   . Tobacco dependence due to cigarettes 09/30/2018    Past Surgical History:  Procedure Laterality Date  . LAPAROTOMY  1974     Current Outpatient Medications  Medication Sig Dispense Refill  . ALPRAZolam (XANAX) 0.5 MG tablet Take 0.5 mg by mouth at bedtime as needed for sleep.    Marland Kitchen amLODipine (NORVASC) 2.5 MG tablet Take 2.5 mg by mouth daily.    Marland Kitchen aspirin EC 81 MG tablet Take 81 mg by mouth daily.    . benazepril (LOTENSIN) 40 MG tablet Take 40 mg by mouth daily.    . Budeson-Glycopyrrol-Formoterol (BREZTRI AEROSPHERE) 160-9-4.8 MCG/ACT AERO Inhale 2 puffs into the  lungs in the morning and at bedtime. 5.9 g 0  . ipratropium-albuterol (DUONEB) 0.5-2.5 (3) MG/3ML SOLN Take 3 mLs by nebulization 4 (four) times daily.    . metoprolol tartrate (LOPRESSOR) 100 MG tablet Take 1 tablet (100 mg total) by mouth once for 1 dose. 1 tablet 0  . Multiple Vitamins-Minerals (CENTRUM SILVER 50+WOMEN) TABS Take 1 tablet by mouth daily.    Marland Kitchen omega-3 fish oil (MAXEPA) 1000 MG CAPS capsule Take 1 capsule by mouth daily.    . polyethylene glycol (MIRALAX / GLYCOLAX) 17 g packet Take 17 g by mouth daily. 14 each 0  . vitamin B-12 (CYANOCOBALAMIN) 1000 MCG tablet Take 1,000 mcg by mouth daily.     No current facility-administered medications for this visit.    Allergies:   Crab [shellfish allergy] and Erythromycin     Social History:  The patient  reports that she has been smoking cigarettes. She has a 28.81 pack-year smoking history. She has never used smokeless tobacco. She reports current alcohol use. She reports previous drug use. Drug: Marijuana.   Family History:  The patient's family history includes COPD in her mother.    ROS:  General:no colds or fevers, no weight changes Skin:no rashes or ulcers HEENT:no blurred vision, no congestion CV:see HPI PUL:see HPI GI:no diarrhea constipation or melena, no indigestion GU:no hematuria, no dysuria MS:no joint pain, no claudication Neuro:no syncope, no lightheadedness Endo:no diabetes, no thyroid disease  Wt Readings from Last 3 Encounters:  09/26/19 135 lb 8 oz (61.5 kg)  07/23/19 136 lb 3.2 oz (61.8 kg)  07/18/19 136 lb 9.6 oz (62 kg)     PHYSICAL EXAM: VS:  BP (!) 152/94   Pulse 96   Ht 5' 10.5" (1.791 m)   Wt 135 lb 8 oz (61.5 kg)   BMI 19.17 kg/m  , BMI Body mass index is 19.17 kg/m. General:Pleasant affect, NAD Skin:Warm and dry, brisk capillary refill HEENT:normocephalic, sclera clear, mucus membranes moist Neck:supple, no JVD, no bruits  Heart:S1S2 RRR without murmur, gallup, rub or click Lungs:clear without rales, rhonchi, or wheezes [id number], non tender, + BS, do not palpate liver spleen or masses Ext:no lower ext edema, 2+ pedal pulses, 2+ radial pulses Neuro:alert and oriented X 3, MAE, follows commands, + facial symmetry    EKG:  EKG is ordered today. The ekg ordered today demonstrates SR at 92, non specific T abnormality.  No changes from 01/05/2019   Recent Labs: 09/30/2018: ALT 18 01/05/2019: B Natriuretic Peptide 161.4; Hemoglobin 17.3; Platelets 287 01/06/2019: BUN 10; Creatinine, Ser 0.60; Potassium 4.5; Sodium 140    Lipid Panel No results found for: CHOL, TRIG, HDL, CHOLHDL, VLDL, LDLCALC, LDLDIRECT     Other studies Reviewed: Additional studies/ records that were reviewed today include: . RCA is a  large dominant artery that gives rise to PDA and PLVB. There is mild calcified plaque in the ostial RCA with associated stenosis of 25-49%. There is moderate calcified plaque in the proximal RCA with associated stenosis of 50-69%. There is severe calcified plaque in the mid RCA with associated stenosis of 70-99%.  Left main is a large artery that gives rise to LAD, small Ramus and LCX arteries. There is mild calcified plaque in the distal LM with associated stenosis of 25-49%.  LAD is a large vessel that gives rise to a large D1 branch. There is moderate calcified plaque in the ostial and proximal LAD with associated stenosis of 50-69%. There is severe calcified plaque in the mid  LAD with associated stenosis of 70-99%. There is mild calcified plaque in the ostial D1 with associated stenosis of 25-49%.  LCX is a non-dominant artery that gives rise to one large OM1 branch. There is moderate calcified plaque in the proximal LCx at the takeoff of a large OM1 with associated stenosis of 50-69%.  Other findings:  Normal pulmonary vein drainage into the left atrium.  Normal let atrial appendage without a thrombus.  Normal size of the pulmonary artery.  IMPRESSION: 1. Coronary calcium score of 1467. This was 99th percentile for age and sex matched control.  2.  Normal coronary origin with right dominance.  3. 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 CAD-RADs 4a.  4.  Recommend cardiac catheterization.  5. Consider symptom-guided anti-ischemic and preventive pharmacotherapy as well as risk factor modification per guideline-directed care.    IMPRESSION: 1. Coronary calcium score of 1467. This was 99th percentile for age and sex matched control.  2.  Normal coronary origin with right dominance.  3. 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 CAD-RADs 4a.  4.  Recommend cardiac catheterization.  5. Consider symptom-guided  anti-ischemic and preventive pharmacotherapy as well as risk factor modification per guideline-directed care.  Traci Turner  ASSESSMENT AND PLAN:  1.  Abnormal Cardiac CTA with Significant CAD, Dr. Radford Pax recommends cardiac cath Lt and Rt. to further eval.  We discussed procedure and Risk involved.  She is anxious but willing to proceed.  She was to have PFTs on the 10th but have asked her to reschedule to proceed with cath first.   I added lipitor low dose with her numbers to help stabilize plaque and low dose BB.  She did take 100 mg lopressor for cardiac CTA without complications.  She will continue her ASA 81 mg daily.   The patient understands that risks included but are not limited to stroke (1 in 1000), death (1 in 12), kidney failure [usually temporary] (1 in 500), bleeding (1 in 200), allergic reaction [possibly serious] (1 in 200).   Pt agreeable to proceed.   2.  COPD with home oxygen.  She is feeling better with new inhaler.  She will reschedule PFTs until after cath.    3.  HTN up some today but will add BB.    4.  Tobacco use - she understands she should stop.     Current medicines are reviewed with the patient today.  The patient Has no concerns regarding medicines.  The following changes have been made:  See above Labs/ tests ordered today include:see above  Disposition:   FU:  see above  Signed, Cecilie Kicks, NP  09/26/2019 4:46 PM    [facility] [place], [place], [place], [place] Phone: [phone]; Fax: [phone]

## 2019-09-26 NOTE — H&P (View-Only) (Signed)
Cardiology Office Note   Date:  09/26/2019   ID:  Caroline Greer, DOB March 10, 1956, MRN [id number]  PCP:  [name], MD  Cardiologist:  Dr. Radford Pax    Chief Complaint  Patient presents with  . Coronary Artery Disease      History of Present Illness: Caroline Greer is a 64 y.o. female who presents for post cardiac CTA and with CAD recommendation for cardiac cath.    She has a hx of COPD on home O2, HTN and ongoing tobacco abuse who had routine lung CA screening CT which showed coronary calcifications of all 3 coronary arteries including the LM as well as aortic atherosclerosis and AV calcifications  and is now referred for further evaluation.    She has fairly significant COPD and has been on O2 as needed when she is doing exertional activities.  She says that about 4 years ago she thought she had an MI.  She was walking back to her car from watching a parade and started feeling her heart pounding and then had chest pressure.  She stopped and rested and it resolved.  She has had worsening DOE recently and saw pulmonary and meds were changed.  She does not wear her O2 24/7.   She denies any   PND, orthopnea, LE edema, dizziness, palpitations or syncope. She is compliant with her meds and is tolerating meds with no SE.  She tells me that she does get increased DOE recently with wakling to the mailbox and sometime her chest will hurt but resolves with rest and O2 for about 30 minutes.   + tobacco    mRCA with 70-99%   There is severe calcified plaque in the mid LAD with associated stenosis of 70-99%.  There is moderate calcified plaque in the proximal LCx at the takeoff of a large OM1 with associated stenosis of 50-69%.  ADDENDUM: Not mentioned in the above addendum impression is enlargement of the pulmonary outflow tract, suggesting pulmonary arterial hypertension.  She is not having chest pain now.  And she feels her breathing is better with breztri aerosphere.  She is anxious about  procedures.   We discussed cath and reason for having and description.  It is planned for next week.  We will ask her to cancel her PFTs for next week and do cardiac cath.   Will adjust her meds.  Reviewed her lipid panel and BMP from PCP office and in March this year her LDL was 55 and HDL 79.  Cr 0.60 and BUN 11.   She is on ASA 81 mg daily.    Past Medical History:  Diagnosis Date  . Acute respiratory failure with hypoxia ([place]) 09/30/2018  . Constipation   . COPD exacerbation ([place]) 01/05/2019  . Depression with anxiety 09/30/2018  . Essential hypertension 09/30/2018  . Hypertension   . Tobacco dependence due to cigarettes 09/30/2018    Past Surgical History:  Procedure Laterality Date  . LAPAROTOMY  1974     Current Outpatient Medications  Medication Sig Dispense Refill  . ALPRAZolam (XANAX) 0.5 MG tablet Take 0.5 mg by mouth at bedtime as needed for sleep.    Marland Kitchen amLODipine (NORVASC) 2.5 MG tablet Take 2.5 mg by mouth daily.    Marland Kitchen aspirin EC 81 MG tablet Take 81 mg by mouth daily.    . benazepril (LOTENSIN) 40 MG tablet Take 40 mg by mouth daily.    . Budeson-Glycopyrrol-Formoterol (BREZTRI AEROSPHERE) 160-9-4.8 MCG/ACT AERO Inhale 2 puffs into the  lungs in the morning and at bedtime. 5.9 g 0  . ipratropium-albuterol (DUONEB) 0.5-2.5 (3) MG/3ML SOLN Take 3 mLs by nebulization 4 (four) times daily.    . metoprolol tartrate (LOPRESSOR) 100 MG tablet Take 1 tablet (100 mg total) by mouth once for 1 dose. 1 tablet 0  . Multiple Vitamins-Minerals (CENTRUM SILVER 50+WOMEN) TABS Take 1 tablet by mouth daily.    Marland Kitchen omega-3 fish oil (MAXEPA) 1000 MG CAPS capsule Take 1 capsule by mouth daily.    . polyethylene glycol (MIRALAX / GLYCOLAX) 17 g packet Take 17 g by mouth daily. 14 each 0  . vitamin B-12 (CYANOCOBALAMIN) 1000 MCG tablet Take 1,000 mcg by mouth daily.     No current facility-administered medications for this visit.    Allergies:   Crab [shellfish allergy] and Erythromycin     Social History:  The patient  reports that she has been smoking cigarettes. She has a 28.81 pack-year smoking history. She has never used smokeless tobacco. She reports current alcohol use. She reports previous drug use. Drug: Marijuana.   Family History:  The patient's family history includes COPD in her mother.    ROS:  General:no colds or fevers, no weight changes Skin:no rashes or ulcers HEENT:no blurred vision, no congestion CV:see HPI PUL:see HPI GI:no diarrhea constipation or melena, no indigestion GU:no hematuria, no dysuria MS:no joint pain, no claudication Neuro:no syncope, no lightheadedness Endo:no diabetes, no thyroid disease  Wt Readings from Last 3 Encounters:  09/26/19 135 lb 8 oz (61.5 kg)  07/23/19 136 lb 3.2 oz (61.8 kg)  07/18/19 136 lb 9.6 oz (62 kg)     PHYSICAL EXAM: VS:  BP (!) 152/94   Pulse 96   Ht 5' 10.5" (1.791 m)   Wt 135 lb 8 oz (61.5 kg)   BMI 19.17 kg/m  , BMI Body mass index is 19.17 kg/m. General:Pleasant affect, NAD Skin:Warm and dry, brisk capillary refill HEENT:normocephalic, sclera clear, mucus membranes moist Neck:supple, no JVD, no bruits  Heart:S1S2 RRR without murmur, gallup, rub or click Lungs:clear without rales, rhonchi, or wheezes [id number], non tender, + BS, do not palpate liver spleen or masses Ext:no lower ext edema, 2+ pedal pulses, 2+ radial pulses Neuro:alert and oriented X 3, MAE, follows commands, + facial symmetry    EKG:  EKG is ordered today. The ekg ordered today demonstrates SR at 92, non specific T abnormality.  No changes from 01/05/2019   Recent Labs: 09/30/2018: ALT 18 01/05/2019: B Natriuretic Peptide 161.4; Hemoglobin 17.3; Platelets 287 01/06/2019: BUN 10; Creatinine, Ser 0.60; Potassium 4.5; Sodium 140    Lipid Panel No results found for: CHOL, TRIG, HDL, CHOLHDL, VLDL, LDLCALC, LDLDIRECT     Other studies Reviewed: Additional studies/ records that were reviewed today include: . RCA is a  large dominant artery that gives rise to PDA and PLVB. There is mild calcified plaque in the ostial RCA with associated stenosis of 25-49%. There is moderate calcified plaque in the proximal RCA with associated stenosis of 50-69%. There is severe calcified plaque in the mid RCA with associated stenosis of 70-99%.  Left main is a large artery that gives rise to LAD, small Ramus and LCX arteries. There is mild calcified plaque in the distal LM with associated stenosis of 25-49%.  LAD is a large vessel that gives rise to a large D1 branch. There is moderate calcified plaque in the ostial and proximal LAD with associated stenosis of 50-69%. There is severe calcified plaque in the mid  LAD with associated stenosis of 70-99%. There is mild calcified plaque in the ostial D1 with associated stenosis of 25-49%.  LCX is a non-dominant artery that gives rise to one large OM1 branch. There is moderate calcified plaque in the proximal LCx at the takeoff of a large OM1 with associated stenosis of 50-69%.  Other findings:  Normal pulmonary vein drainage into the left atrium.  Normal let atrial appendage without a thrombus.  Normal size of the pulmonary artery.  IMPRESSION: 1. Coronary calcium score of 1467. This was 99th percentile for age and sex matched control.  2.  Normal coronary origin with right dominance.  3. 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 CAD-RADs 4a.  4.  Recommend cardiac catheterization.  5. Consider symptom-guided anti-ischemic and preventive pharmacotherapy as well as risk factor modification per guideline-directed care.    IMPRESSION: 1. Coronary calcium score of 1467. This was 99th percentile for age and sex matched control.  2.  Normal coronary origin with right dominance.  3. 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 CAD-RADs 4a.  4.  Recommend cardiac catheterization.  5. Consider symptom-guided  anti-ischemic and preventive pharmacotherapy as well as risk factor modification per guideline-directed care.  Traci Turner  ASSESSMENT AND PLAN:  1.  Abnormal Cardiac CTA with Significant CAD, Dr. Radford Pax recommends cardiac cath Lt and Rt. to further eval.  We discussed procedure and Risk involved.  She is anxious but willing to proceed.  She was to have PFTs on the 10th but have asked her to reschedule to proceed with cath first.   I added lipitor low dose with her numbers to help stabilize plaque and low dose BB.  She did take 100 mg lopressor for cardiac CTA without complications.  She will continue her ASA 81 mg daily.   The patient understands that risks included but are not limited to stroke (1 in 1000), death (1 in 56), kidney failure [usually temporary] (1 in 500), bleeding (1 in 200), allergic reaction [possibly serious] (1 in 200).   Pt agreeable to proceed.   2.  COPD with home oxygen.  She is feeling better with new inhaler.  She will reschedule PFTs until after cath.    3.  HTN up some today but will add BB.    4.  Tobacco use - she understands she should stop.     Current medicines are reviewed with the patient today.  The patient Has no concerns regarding medicines.  The following changes have been made:  See above Labs/ tests ordered today include:see above  Disposition:   FU:  see above  Signed, Cecilie Kicks, NP  09/26/2019 4:46 PM    [facility] [address], [place], [address] [place], [place] Phone: [phone]; Fax: [phone]

## 2019-09-27 ENCOUNTER — Telehealth: Payer: Self-pay | Admitting: Pulmonary Disease

## 2019-09-27 LAB — BASIC METABOLIC PANEL
BUN/Creatinine Ratio: 16 (ref 12–28)
BUN: 12 mg/dL (ref 8–27)
CO2: 25 mmol/L (ref 20–29)
Calcium: 9.4 mg/dL (ref 8.7–10.3)
Chloride: 98 mmol/L (ref 96–106)
Creatinine, Ser: 0.77 mg/dL (ref 0.57–1.00)
GFR calc Af Amer: 93 mL/min/{1.73_m2} (ref >59)
GFR calc non Af Amer: 81 mL/min/{1.73_m2} (ref >59)
Glucose: 94 mg/dL (ref 65–99)
Potassium: 4.2 mmol/L (ref 3.5–5.2)
Sodium: 138 mmol/L (ref 134–144)

## 2019-09-27 LAB — CBC
Hematocrit: 52.2 % — ABNORMAL HIGH (ref 34.0–46.6)
Hemoglobin: 17.3 g/dL — ABNORMAL HIGH (ref 11.1–15.9)
MCH: 29.1 pg (ref 26.6–33.0)
MCHC: 33.1 g/dL (ref 31.5–35.7)
MCV: 88 fL (ref 79–97)
Platelets: 236 10*3/uL (ref 150–450)
RBC: 5.95 x10E6/uL — ABNORMAL HIGH (ref 3.77–5.28)
RDW: 19.4 % — ABNORMAL HIGH (ref 11.7–15.4)
WBC: 12.5 10*3/uL — ABNORMAL HIGH (ref 3.4–10.8)

## 2019-09-27 MED ORDER — BREZTRI AEROSPHERE 160-9-4.8 MCG/ACT IN AERO: 2.0000 | INHALATION_SPRAY | Freq: Two times a day (BID) | RESPIRATORY_TRACT | 1 refills | Status: DC

## 2019-09-27 MED ORDER — ALBUTEROL SULFATE HFA 108 (90 BASE) MCG/ACT IN AERS
2.0000 | INHALATION_SPRAY | Freq: Four times a day (QID) | RESPIRATORY_TRACT | 1 refills | Status: DC | PRN
Start: 2019-09-27 — End: 2019-10-03

## 2019-09-27 NOTE — Telephone Encounter (Signed)
Called and spoke with Patient. Patient stated she is switching to [facility] for pharmacy.  Patient requested 90 days for her emergency inhaler and breztri inhaler.  Med list from [facility] [place]- 07/18/19 with Dr [name]-  Current Outpatient Medications:  .  albuterol (VENTOLIN HFA) 108 (90 Base) MCG/ACT inhaler, Inhale 2 puffs into the lungs every 6 (six) hours as needed for wheezing or shortness of breath., Disp: 8 g, Rfl: 5 .  ALPRAZolam (XANAX) 0.5 MG tablet, Take 0.5 mg by mouth at bedtime as needed for sleep., Disp: , Rfl:  .  amLODipine (NORVASC) 2.5 MG tablet, Take 2.5 mg by mouth daily., Disp: , Rfl:  .  aspirin EC 81 MG tablet, Take 81 mg by mouth daily., Disp: , Rfl:  .  benazepril (LOTENSIN) 40 MG tablet, Take 40 mg by mouth daily., Disp: , Rfl:  .  ipratropium-albuterol (DUONEB) 0.5-2.5 (3) MG/3ML SOLN, Take 3 mLs by nebulization 4 (four) times daily., Disp: , Rfl:  .  Multiple Vitamins-Minerals (CENTRUM SILVER 50+WOMEN) TABS, Take 1 tablet by mouth daily., Disp: , Rfl:  .  omega-3 fish oil (MAXEPA) 1000 MG CAPS capsule, Take 1 capsule by mouth daily., Disp: , Rfl:  .  polyethylene glycol (MIRALAX / GLYCOLAX) 17 g packet, Take 17 g by mouth daily., Disp: 14 each, Rfl: 0 .  umeclidinium-vilanterol (ANORO ELLIPTA) 62.5-25 MCG/INH AEPB, Inhale 1 puff into the lungs daily., Disp: 60 each, Rfl: 4 .  vitamin B-12 (CYANOCOBALAMIN) 1000 MCG tablet, Take 1,000 mcg by mouth daily., Disp: , Rfl:  .  Fluticasone-Umeclidin-Vilant (TRELEGY ELLIPTA) 100-62.5-25 MCG/INH AEPB, Inhale 1 puff into the lungs daily. (Patient not taking: Reported on 06/13/2019), Disp: 1 each, Rfl:11  Instructions    Return in about 8 weeks (around 09/12/2019) for with APP, Derl Barrow, NP . Thank you for visiting Dr. [name] at [facility]. Today we recommend the following:  Full PFTs prior to next office visit  Samples of breztri with spacer  congrats on stopping smoking!  Return in about 8 weeks (around 09/12/2019) for with  APP, Derl Barrow, NP .

## 2019-09-28 ENCOUNTER — Other Ambulatory Visit (HOSPITAL_COMMUNITY): Payer: Medicare Other

## 2019-09-29 ENCOUNTER — Other Ambulatory Visit (HOSPITAL_COMMUNITY)
Admission: RE | Admit: 2019-09-29 | Discharge: 2019-09-29 | Disposition: A | Discharge status: Home / Self Care | Payer: Medicare Other | Source: Ambulatory Visit | Attending: Cardiovascular Disease | Admitting: Cardiovascular Disease

## 2019-09-29 DIAGNOSIS — Z20822 Contact with and (suspected) exposure to covid-19: Secondary | ICD-10-CM | POA: Insufficient documentation

## 2019-09-29 DIAGNOSIS — Z01812 Encounter for preprocedural laboratory examination: Secondary | ICD-10-CM | POA: Insufficient documentation

## 2019-09-29 LAB — SARS CORONAVIRUS 2 (TAT 6-24 HRS): SARS Coronavirus 2: NEGATIVE

## 2019-10-01 ENCOUNTER — Telehealth: Payer: Self-pay | Admitting: *Deleted

## 2019-10-01 ENCOUNTER — Ambulatory Visit: Payer: Medicare Other | Admitting: Adult Health

## 2019-10-01 ENCOUNTER — Ambulatory Visit: Payer: Medicare Other | Admitting: Primary Care

## 2019-10-01 NOTE — Telephone Encounter (Signed)
Pt contacted pre-catheterization scheduled at [facility] for: Tuesday Oct 02, 2019 8 AM Verified arrival time and place: [facility]) at: 6 AM   No solid food after midnight prior to cath, clear liquids until 5 AM day of procedure.   AM meds can be  taken pre-cath with sip of water including: ASA 81 mg   Confirmed patient has responsible adult to drive home post procedure and observe 24 hours after arriving home: yes  You are allowed ONE visitor in the waiting room during your procedures. Both you and your visitor must wear masks.      COVID-19 Pre-Screening Questions:  . In the past 7 to 10 days have you had a cough,  shortness of breath, headache, congestion, fever (100 or greater) body aches, chills, sore throat, or sudden loss of taste or sense of smell? no . Have you been around anyone with known Covid 19 in the past 7 to 10 days? no . Have you been around anyone who is awaiting Covid 19 test results in the past 7 to 10 days? no . Have you been around anyone who  has mentioned symptoms of Covid 19 in the past 7 to 10 days? no     I reviewed procedure/mask/visitor instructions, COVID-19 screening questions with patient.

## 2019-10-02 ENCOUNTER — Other Ambulatory Visit: Payer: Self-pay

## 2019-10-02 ENCOUNTER — Ambulatory Visit (HOSPITAL_COMMUNITY)
Admission: RE | Admit: 2019-10-02 | Discharge: 2019-10-02 | Disposition: A | Discharge status: Home / Self Care | Payer: Medicare Other | Attending: Cardiovascular Disease | Admitting: Cardiovascular Disease

## 2019-10-02 ENCOUNTER — Encounter (HOSPITAL_COMMUNITY)
Admission: RE | Disposition: A | Discharge status: Home / Self Care | Payer: Self-pay | Source: Home / Self Care | Attending: Cardiovascular Disease

## 2019-10-02 DIAGNOSIS — J449 Chronic obstructive pulmonary disease, unspecified: Secondary | ICD-10-CM | POA: Diagnosis not present

## 2019-10-02 DIAGNOSIS — I10 Essential (primary) hypertension: Secondary | ICD-10-CM | POA: Insufficient documentation

## 2019-10-02 DIAGNOSIS — F418 Other specified anxiety disorders: Secondary | ICD-10-CM | POA: Diagnosis not present

## 2019-10-02 DIAGNOSIS — R931 Abnormal findings on diagnostic imaging of heart and coronary circulation: Secondary | ICD-10-CM | POA: Diagnosis present

## 2019-10-02 DIAGNOSIS — I2510 Atherosclerotic heart disease of native coronary artery without angina pectoris: Secondary | ICD-10-CM | POA: Insufficient documentation

## 2019-10-02 DIAGNOSIS — F17210 Nicotine dependence, cigarettes, uncomplicated: Secondary | ICD-10-CM | POA: Insufficient documentation

## 2019-10-02 DIAGNOSIS — Z881 Allergy status to other antibiotic agents status: Secondary | ICD-10-CM | POA: Insufficient documentation

## 2019-10-02 DIAGNOSIS — Z79899 Other long term (current) drug therapy: Secondary | ICD-10-CM | POA: Diagnosis not present

## 2019-10-02 DIAGNOSIS — I272 Pulmonary hypertension, unspecified: Secondary | ICD-10-CM | POA: Insufficient documentation

## 2019-10-02 DIAGNOSIS — Z9981 Dependence on supplemental oxygen: Secondary | ICD-10-CM | POA: Insufficient documentation

## 2019-10-02 DIAGNOSIS — Z7982 Long term (current) use of aspirin: Secondary | ICD-10-CM | POA: Insufficient documentation

## 2019-10-02 HISTORY — PX: RIGHT/LEFT HEART CATH AND CORONARY ANGIOGRAPHY: CATH118266

## 2019-10-02 LAB — POCT I-STAT EG7
Acid-Base Excess: 2 mmol/L (ref 0.0–2.0)
Acid-Base Excess: 1 mmol/L (ref 0.0–2.0)
Bicarbonate: 30.9 mmol/L — ABNORMAL HIGH (ref 20.0–28.0)
Bicarbonate: 30.4 mmol/L — ABNORMAL HIGH (ref 20.0–28.0)
Calcium, Ion: 1.23 mmol/L (ref 1.15–1.40)
Calcium, Ion: 1.2 mmol/L (ref 1.15–1.40)
HCT: 52 % — ABNORMAL HIGH (ref 36.0–46.0)
HCT: 51 % — ABNORMAL HIGH (ref 36.0–46.0)
Hemoglobin: 17.7 g/dL — ABNORMAL HIGH (ref 12.0–15.0)
Hemoglobin: 17.3 g/dL — ABNORMAL HIGH (ref 12.0–15.0)
O2 Saturation: 79 %
O2 Saturation: 86 %
Potassium: 3.5 mmol/L (ref 3.5–5.1)
Potassium: 3.5 mmol/L (ref 3.5–5.1)
Sodium: 141 mmol/L (ref 135–145)
Sodium: 141 mmol/L (ref 135–145)
TCO2: 33 mmol/L — ABNORMAL HIGH (ref 22–32)
TCO2: 32 mmol/L (ref 22–32)
pCO2, Ven: 64 mmHg — ABNORMAL HIGH (ref 44.0–60.0)
pCO2, Ven: 64.7 mmHg — ABNORMAL HIGH (ref 44.0–60.0)
pH, Ven: 7.293 (ref 7.250–7.430)
pH, Ven: 7.28 (ref 7.250–7.430)
pO2, Ven: 50 mmHg — ABNORMAL HIGH (ref 32.0–45.0)
pO2, Ven: 59 mmHg — ABNORMAL HIGH (ref 32.0–45.0)

## 2019-10-02 LAB — POCT I-STAT 7, (LYTES, BLD GAS, ICA,H+H)
Acid-Base Excess: 3 mmol/L — ABNORMAL HIGH (ref 0.0–2.0)
Bicarbonate: 31.5 mmol/L — ABNORMAL HIGH (ref 20.0–28.0)
Calcium, Ion: 1.25 mmol/L (ref 1.15–1.40)
HCT: 52 % — ABNORMAL HIGH (ref 36.0–46.0)
Hemoglobin: 17.7 g/dL — ABNORMAL HIGH (ref 12.0–15.0)
O2 Saturation: 97 %
Potassium: 3.6 mmol/L (ref 3.5–5.1)
Sodium: 139 mmol/L (ref 135–145)
TCO2: 33 mmol/L — ABNORMAL HIGH (ref 22–32)
pCO2 arterial: 60.1 mmHg — ABNORMAL HIGH (ref 32.0–48.0)
pH, Arterial: 7.327 — ABNORMAL LOW (ref 7.350–7.450)
pO2, Arterial: 103 mmHg (ref 83.0–108.0)

## 2019-10-02 SURGERY — RIGHT/LEFT HEART CATH AND CORONARY ANGIOGRAPHY
Anesthesia: LOCAL

## 2019-10-02 MED ORDER — VERAPAMIL HCL 2.5 MG/ML IV SOLN
INTRAVENOUS | Status: AC
  Filled 2019-10-02: qty 2

## 2019-10-02 MED ORDER — MIDAZOLAM HCL 2 MG/2ML IJ SOLN
INTRAMUSCULAR | Status: AC
  Filled 2019-10-02: qty 2

## 2019-10-02 MED ORDER — HEPARIN (PORCINE) IN NACL 1000-0.9 UT/500ML-% IV SOLN
INTRAVENOUS | Status: AC
  Filled 2019-10-02: qty 1000

## 2019-10-02 MED ORDER — SODIUM CHLORIDE 0.9% FLUSH: 3.0000 mL | Freq: Two times a day (BID) | INTRAVENOUS | Status: DC

## 2019-10-02 MED ORDER — HEPARIN SODIUM (PORCINE) 1000 UNIT/ML IJ SOLN
INTRAMUSCULAR | Status: AC
  Filled 2019-10-02: qty 1

## 2019-10-02 MED ORDER — SODIUM CHLORIDE 0.9% FLUSH: 3.0000 mL | INTRAVENOUS | Status: DC | PRN

## 2019-10-02 MED ORDER — FENTANYL CITRATE (PF) 100 MCG/2ML IJ SOLN
INTRAMUSCULAR | Status: AC
  Filled 2019-10-02: qty 2

## 2019-10-02 MED ORDER — ACETAMINOPHEN 325 MG PO TABS: 650.0000 mg | ORAL_TABLET | ORAL | Status: DC | PRN

## 2019-10-02 MED ORDER — LIDOCAINE HCL (PF) 1 % IJ SOLN
INTRAMUSCULAR | Status: AC
  Filled 2019-10-02: qty 30

## 2019-10-02 MED ORDER — SODIUM CHLORIDE 0.9 % WEIGHT BASED INFUSION: 1.0000 mL/kg/h | INTRAVENOUS | Status: DC

## 2019-10-02 MED ORDER — SODIUM CHLORIDE 0.9 % IV SOLN: 250.0000 mL | INTRAVENOUS | Status: DC | PRN

## 2019-10-02 MED ORDER — LIDOCAINE HCL (PF) 1 % IJ SOLN
INTRAMUSCULAR | Status: DC | PRN
  Administered 2019-10-02 (×2): 2 mL

## 2019-10-02 MED ORDER — VERAPAMIL HCL 2.5 MG/ML IV SOLN
INTRAVENOUS | Status: DC | PRN
  Administered 2019-10-02: 10 mL via INTRA_ARTERIAL

## 2019-10-02 MED ORDER — FENTANYL CITRATE (PF) 100 MCG/2ML IJ SOLN
INTRAMUSCULAR | Status: DC | PRN
  Administered 2019-10-02: 25 ug via INTRAVENOUS

## 2019-10-02 MED ORDER — ONDANSETRON HCL 4 MG/2ML IJ SOLN: 4.0000 mg | Freq: Four times a day (QID) | INTRAMUSCULAR | Status: DC | PRN

## 2019-10-02 MED ORDER — DIAZEPAM 5 MG PO TABS
5.0000 mg | ORAL_TABLET | Freq: Once | ORAL | Status: AC
  Administered 2019-10-02: 5 mg via ORAL
  Filled 2019-10-02: qty 1

## 2019-10-02 MED ORDER — LABETALOL HCL 5 MG/ML IV SOLN: 10.0000 mg | INTRAVENOUS | Status: DC | PRN

## 2019-10-02 MED ORDER — IOHEXOL 350 MG/ML SOLN
INTRAVENOUS | Status: DC | PRN
  Administered 2019-10-02: 40 mL

## 2019-10-02 MED ORDER — HYDRALAZINE HCL 20 MG/ML IJ SOLN: 10.0000 mg | INTRAMUSCULAR | Status: DC | PRN

## 2019-10-02 MED ORDER — MIDAZOLAM HCL 2 MG/2ML IJ SOLN
INTRAMUSCULAR | Status: DC | PRN
  Administered 2019-10-02 (×2): 1 mg via INTRAVENOUS

## 2019-10-02 MED ORDER — HEPARIN (PORCINE) IN NACL 1000-0.9 UT/500ML-% IV SOLN
INTRAVENOUS | Status: DC | PRN
  Administered 2019-10-02 (×2): 500 mL

## 2019-10-02 MED ORDER — HEPARIN SODIUM (PORCINE) 1000 UNIT/ML IJ SOLN
INTRAMUSCULAR | Status: DC | PRN
  Administered 2019-10-02: 3000 [IU] via INTRAVENOUS

## 2019-10-02 MED ORDER — ASPIRIN 81 MG PO CHEW: 81.0000 mg | CHEWABLE_TABLET | ORAL | Status: DC

## 2019-10-02 MED ORDER — SODIUM CHLORIDE 0.9 % WEIGHT BASED INFUSION
3.0000 mL/kg/h | INTRAVENOUS | Status: AC
  Administered 2019-10-02: 3 mL/kg/h via INTRAVENOUS

## 2019-10-02 SURGICAL SUPPLY — 11 items

## 2019-10-02 NOTE — Progress Notes (Signed)
Discharge instructions reviewed with pt and her daughter (via telephone) both voice understanding.  

## 2019-10-02 NOTE — Interval H&P Note (Signed)
History and Physical Interval Note:  10/02/2019 8:11 AM  Caroline Greer  has presented today for surgery, with the diagnosis of abnormal CT.  The various methods of treatment have been discussed with the patient and family. After consideration of risks, benefits and other options for treatment, the patient has consented to  Procedure(s): RIGHT/LEFT HEART CATH AND CORONARY ANGIOGRAPHY (N/A) as a surgical intervention.  The patient's history has been reviewed, patient examined, no change in status, stable for surgery.  I have reviewed the patient's chart and labs.  Questions were answered to the patient's satisfaction.     Sherren Mocha

## 2019-10-02 NOTE — Discharge Instructions (Signed)
Radial Site Care  This sheet gives you information about how to care for yourself after your procedure. Your health care provider may also give you more specific instructions. If you have problems or questions, contact your health care provider. What can I expect after the procedure? After the procedure, it is common to have:  Bruising and tenderness at the catheter insertion area. Follow these instructions at home: Medicines  Take over-the-counter and prescription medicines only as told by your health care provider. Insertion site care  Follow instructions from your health care provider about how to take care of your insertion site. Make sure you: ? Wash your hands with soap and water before you change your bandage (dressing). If soap and water are not available, use hand sanitizer. ? Change your dressing as told by your health care provider. ? Leave stitches (sutures), skin glue, or adhesive strips in place. These skin closures may need to stay in place for 2 weeks or longer. If adhesive strip edges start to loosen and curl up, you may trim the loose edges. Do not remove adhesive strips completely unless your health care provider tells you to do that.  Check your insertion site every day for signs of infection. Check for: ? Redness, swelling, or pain. ? Fluid or blood. ? Pus or a bad smell. ? Warmth.  Do not take baths, swim, or use a hot tub until your health care provider approves.  You may shower 24-48 hours after the procedure, or as directed by your health care provider. ? Remove the dressing and gently wash the site with plain soap and water. ? Pat the area dry with a clean towel. ? Do not rub the site. That could cause bleeding.  Do not apply powder or lotion to the site. Activity   For 24 hours after the procedure, or as directed by your health care provider: ? Do not flex or bend the affected arm. ? Do not push or pull heavy objects with the affected arm. ? Do not  drive yourself home from the hospital or clinic. You may drive 24 hours after the procedure unless your health care provider tells you not to. ? Do not operate machinery or power tools.  Do not lift anything that is heavier than 10 lb (4.5 kg), or the limit that you are told, until your health care provider says that it is safe.  Ask your health care provider when it is okay to: ? Return to work or school. ? Resume usual physical activities or sports. ? Resume sexual activity. General instructions  If the catheter site starts to bleed, raise your arm and put firm pressure on the site. If the bleeding does not stop, get help right away. This is a medical emergency.  If you went home on the same day as your procedure, a responsible adult should be with you for the first 24 hours after you arrive home.  Keep all follow-up visits as told by your health care provider. This is important. Contact a health care provider if:  You have a fever.  You have redness, swelling, or yellow drainage around your insertion site. Get help right away if:  You have unusual pain at the radial site.  The catheter insertion area swells very fast.  The insertion area is bleeding, and the bleeding does not stop when you hold steady pressure on the area.  Your arm or hand becomes pale, cool, tingly, or numb. These symptoms may represent a serious problem   that is an emergency. Do not wait to see if the symptoms will go away. Get medical help right away. Call your local emergency services (911 in the [place] Do not drive yourself to the hospital. Summary  After the procedure, it is common to have bruising and tenderness at the site.  Follow instructions from your health care provider about how to take care of your radial site wound. Check the wound every day for signs of infection.  Do not lift anything that is heavier than 10 lb (4.5 kg), or the limit that you are told, until your health care provider says  that it is safe. This information is not intended to replace advice given to you by your health care provider. Make sure you discuss any questions you have with your health care provider. Document Revised: 06/15/2017 Document Reviewed: 06/15/2017 Elsevier Patient Education  2020 [employer]  

## 2019-10-02 NOTE — Addendum Note (Signed)
Addended by: Georgiann Cocker on: 10/02/2019 08:18 AM   Modules accepted: Orders

## 2019-10-03 ENCOUNTER — Other Ambulatory Visit: Payer: Self-pay | Admitting: Pulmonary Disease

## 2019-10-03 MED ORDER — PROAIR HFA 108 (90 BASE) MCG/ACT IN AERS: 2.0000 | INHALATION_SPRAY | Freq: Four times a day (QID) | RESPIRATORY_TRACT | 2 refills | Status: DC | PRN

## 2019-10-09 ENCOUNTER — Other Ambulatory Visit: Payer: Self-pay | Admitting: *Deleted

## 2019-10-09 MED ORDER — PROAIR HFA 108 (90 BASE) MCG/ACT IN AERS: 2.0000 | INHALATION_SPRAY | Freq: Four times a day (QID) | RESPIRATORY_TRACT | 3 refills | Status: DC | PRN

## 2019-10-16 ENCOUNTER — Ambulatory Visit (INDEPENDENT_AMBULATORY_CARE_PROVIDER_SITE_OTHER): Payer: Medicare Other | Admitting: Cardiology

## 2019-10-16 ENCOUNTER — Other Ambulatory Visit: Payer: Self-pay

## 2019-10-16 ENCOUNTER — Encounter: Payer: Self-pay | Admitting: Cardiology

## 2019-10-16 VITALS — BP 150/80 | HR 72 | Ht 70.0 in | Wt 134.0 lb

## 2019-10-16 DIAGNOSIS — J449 Chronic obstructive pulmonary disease, unspecified: Secondary | ICD-10-CM | POA: Diagnosis not present

## 2019-10-16 DIAGNOSIS — I2510 Atherosclerotic heart disease of native coronary artery without angina pectoris: Secondary | ICD-10-CM | POA: Diagnosis not present

## 2019-10-16 DIAGNOSIS — Z720 Tobacco use: Secondary | ICD-10-CM

## 2019-10-16 DIAGNOSIS — I10 Essential (primary) hypertension: Secondary | ICD-10-CM | POA: Diagnosis not present

## 2019-10-16 MED ORDER — AMLODIPINE BESYLATE 5 MG PO TABS: 5.0000 mg | ORAL_TABLET | Freq: Every day | ORAL | 3 refills | Status: DC

## 2019-10-16 MED ORDER — ATORVASTATIN CALCIUM 20 MG PO TABS: 20.0000 mg | ORAL_TABLET | Freq: Every day | ORAL | 3 refills | Status: DC

## 2019-10-16 NOTE — Progress Notes (Signed)
Cardiology Office Note   Date:  10/17/2019   ID:  Caroline Greer, DOB [birth year], MRN [id number]  PCP:  [name], MD  Cardiologist:  Dr. Radford Pax    Chief Complaint  Patient presents with  . Hospitalization Follow-up    post cath      History of Present Illness: Caroline Greer is a 64 y.o. female who presents for post cardiac cath   She has a hx of COPDon home O2, HTN and ongoing tobacco abuse who had routine lung CA screening CT which showed coronary calcificationsof all 3 coronary arteries including the LM as well as aortic atherosclerosis and AV calcificationsand is now referred for further evaluation.   She has fairly significant COPD and has been on O2 as needed when she is doing exertional activities. She says that about 4 years ago she thought she had an MI. She was walking back to her car from watching a parade and started feeling her heart pounding and then had chest pressure. She stopped and rested and it resolved. She has had worsening DOE recently and saw pulmonary and meds were changed. She does not wear her O2 24/7.Shedenies any PND, orthopnea, LE edema, dizziness, palpitations or syncope. Sheis compliant with hermeds and is tolerating meds with no SE. She tells me that she does get increased DOE recently with wakling to the mailbox and sometime her chest will hurt but resolves with rest and O2 for about 30 minutes. + tobacco    mRCA with 70-99%   There is severe calcified plaque in the mid LAD with associated stenosis of 70-99%.  There is moderate calcified plaque in the proximal LCx at the takeoff of a large OM1 with associated stenosis of 50-69%.  ADDENDUM: Not mentioned in the above addendum impression is enlargement of the pulmonary outflow tract, suggesting pulmonary arterial hypertension.  She is not having chest pain now.  And she feels her breathing is better with breztri aerosphere.  She is anxious about procedures.   We discussed  cath and reason for having and description.  It is planned for next week.  We will ask her to cancel her PFTs for next week and do cardiac cath.   Will adjust her meds.  Reviewed her lipid panel and BMP from PCP office and in March this year her LDL was 55 and HDL 79.  Cr 0.60 and BUN 11.   She is on ASA 81 mg daily.   She had cath 10/03/19 and she had non obstructive disease of 50 % in most vessels normal LVEDP and she had moderate pulmonary HTN with PVR 5.1 wood unitspreserved cardiac output and normal RA pressure (likely secondary to chronic lung disease)  Today her sp02 was low on arrival but came up, she has 02 at home but did not bring.  Discussed increased cardiac work load with lower oxygen levels.  BP elevated, meds adjusted.  She has pain at cath site Rt wrist, mild bruising, healing but tender to touch, no edema no redness.  Discussed BP control and lipid control.       Past Medical History:  Diagnosis Date  . Acute respiratory failure with hypoxia ([place]) 09/30/2018  . Constipation   . COPD exacerbation ([place]) 01/05/2019  . Depression with anxiety 09/30/2018  . Essential hypertension 09/30/2018  . Hypertension   . Tobacco dependence due to cigarettes 09/30/2018    Past Surgical History:  Procedure Laterality Date  . LAPAROTOMY  1974  . RIGHT/LEFT HEART CATH AND CORONARY  ANGIOGRAPHY N/A 10/02/2019   Procedure: RIGHT/LEFT HEART CATH AND CORONARY ANGIOGRAPHY;  Surgeon: Sherren Mocha, MD;  Location: [facility];  Service: Cardiovascular;  Laterality: N/A;     Current Outpatient Medications  Medication Sig Dispense Refill  . ALPRAZolam (XANAX) 0.5 MG tablet Take 0.5 mg by mouth at bedtime as needed for sleep.    Marland Kitchen amLODipine (NORVASC) 5 MG tablet Take 1 tablet (5 mg total) by mouth daily. 90 tablet 3  . aspirin EC 81 MG tablet Take 81 mg by mouth daily.    Marland Kitchen atorvastatin (LIPITOR) 20 MG tablet Take 1 tablet (20 mg total) by mouth daily. 90 tablet 3  . benazepril (LOTENSIN) 40 MG  tablet Take 40 mg by mouth daily.    . Budeson-Glycopyrrol-Formoterol (BREZTRI AEROSPHERE) 160-9-4.8 MCG/ACT AERO Inhale 2 puffs into the lungs in the morning and at bedtime. 32.1 g 1  . ipratropium-albuterol (DUONEB) 0.5-2.5 (3) MG/3ML SOLN Take 3 mLs by nebulization 4 (four) times daily as needed (shortness of breath).     . loratadine (CLARITIN) 10 MG tablet Take 10 mg by mouth daily as needed for allergies.    . metoprolol tartrate (LOPRESSOR) 25 MG tablet Take 1 tablet (25 mg total) by mouth 2 (two) times daily. 180 tablet 3  . Multiple Vitamins-Minerals (CENTRUM SILVER 50+WOMEN) TABS Take 1 tablet by mouth daily.    . Omega-3 Fatty Acids (FISH OIL) 1000 MG CAPS Take 1,000 mg by mouth daily.    . polyethylene glycol (MIRALAX / GLYCOLAX) 17 g packet Take 17 g by mouth daily. (Patient taking differently: Take 17 g by mouth as needed for moderate constipation. ) 14 each 0  . PROAIR HFA 108 (90 Base) MCG/ACT inhaler Inhale 2 puffs into the lungs every 6 (six) hours as needed for wheezing or shortness of breath. 90 day supply 18 g 3  . vitamin B-12 (CYANOCOBALAMIN) 500 MCG tablet Take 500 mcg by mouth daily.     No current facility-administered medications for this visit.    Allergies:   Crab [shellfish allergy] and Erythromycin    Social History:  The patient  reports that she has been smoking cigarettes. She has a 28.81 pack-year smoking history. She has never used smokeless tobacco. She reports current alcohol use. She reports previous drug use. Drug: Marijuana.   Family History:  The patient's family history includes COPD in her mother.    ROS:  General:no colds or fevers, no weight changes Skin:no rashes or ulcers HEENT:no blurred vision, no congestion CV:see HPI PUL:see HPI GI:no diarrhea constipation or melena, no indigestion GU:no hematuria, no dysuria MS:no joint pain, no claudication see HPI Neuro:no syncope, no lightheadedness Endo:no diabetes, no thyroid disease  Wt  Readings from Last 3 Encounters:  10/16/19 134 lb (60.8 kg)  10/02/19 135 lb (61.2 kg)  09/26/19 135 lb 8 oz (61.5 kg)     PHYSICAL EXAM: VS:  BP (!) 150/80   Pulse 72   Ht 5\' 10"  (1.778 m)   Wt 134 lb (60.8 kg)   SpO2 91%   BMI 19.23 kg/m  , BMI Body mass index is 19.23 kg/m. General:Pleasant affect, NAD Skin:Warm and dry, brisk capillary refill HEENT:normocephalic, sclera clear, mucus membranes moist Neck:supple, no JVD, no bruits  Heart:S1S2 RRR without murmur, gallup, rub or click Lungs:clear without rales, rhonchi, or wheezes [id number], non tender, + BS, do not palpate liver spleen or masses Ext:no lower ext edema, 2+ pedal pulses, 2+ radial pulses, rt wrist bruising, no redness  no edema + tenderness Neuro:alert and oriented X 3, MAE, follows commands, + facial symmetry    EKG:  EKG is NOT ordered today.    Recent Labs: 01/05/2019: B Natriuretic Peptide 161.4 09/26/2019: BUN 12; Creatinine, Ser 0.77; Platelets 236 10/02/2019: Hemoglobin 17.3; Potassium 3.5; Sodium 141    Lipid Panel No results found for: CHOL, TRIG, HDL, CHOLHDL, VLDL, LDLCALC, LDLDIRECT     Other studies Reviewed: Additional studies/ records that were reviewed today include:  Cardiac cath 10/02/19 .  Mid RCA lesion is 50% stenosed.  1st Mrg lesion is 50% stenosed.  Prox Cx to Mid Cx lesion is 50% stenosed.  Prox LAD to Mid LAD lesion is 30% stenosed.  1st Diag lesion is 50% stenosed.  LV end diastolic pressure is normal.   1.  Mild to moderate multivessel nonobstructive coronary artery disease as outlined with 50% stenosis of the mid RCA, 30 to 40% stenosis of the proximal to mid LAD, 50% diagonal ostial stenosis, and 40 to 50% stenosis of the left circumflex/first OM bifurcation 2.  Normal LVEDP 3.  Moderate pulmonary hypertension with PVR of 5.1 Wood units, preserved cardiac output and normal RA pressure (likely secondary to chronic lung disease)  Recommend: medical  therapy  ASSESSMENT AND PLAN:  1.  CAD in native coronary arteries, noted on cardiac CTA and with cardiac cath non obstructive disease, to treat medically goal LDL <70 continue ASA and control BP.    2.  HTN elevated will increase amlodipine to 5 mg daily.  And she is on lopressor 25 mg BID.   3.  HLD increase lipitor to 20 mg to stabilize coronary plaque. Recheck Hepatic and lipid in 6 -8 weeks.    4. Moderate pulmonary hypertension normal RA pressure and preserved cardiac output. - most likely due to chronic lung disease.  5. Chronic respiratory failure on home 02, COPD per pulmonary   6. Tobacco use she knows she should stop  Follow up with Dr. Radford Pax 3 months  Current medicines are reviewed with the patient today.  The patient Has no concerns regarding medicines.  The following changes have been made:  See above Labs/ tests ordered today include:see above  Disposition:   FU:  see above  Signed, Cecilie Kicks, NP  10/17/2019 8:36 PM    [facility] [address], [place], [place] Phone: [phone]; Fax: [phone]

## 2019-10-16 NOTE — Patient Instructions (Signed)
Medication Instructions:  Your physician has recommended you make the following change in your medication:   1-Increase Amlodipine 5 mg by mouth daily 2-Increase Lipitor 20 mg by mouth daily  *If you need a refill on your cardiac medications before your next appointment, please call your pharmacy*  Lab Work: If you have labs (blood work) drawn today and your tests are completely normal, you will receive your results only by: Marland Kitchen MyChart Message (if you have MyChart) OR . A paper copy in the mail If you have any lab test that is abnormal or we need to change your treatment, we will call you to review the results.  Follow-Up: At [facility], you and your health needs are our priority.  As part of our continuing mission to provide you with exceptional heart care, we have created designated Provider Care Teams.  These Care Teams include your primary Cardiologist (physician) and Advanced Practice Providers (APPs -  Physician Assistants and Nurse Practitioners) who all work together to provide you with the care you need, when you need it.  We recommend signing up for the patient portal called "MyChart".  Sign up information is provided on this After Visit Summary.  MyChart is used to connect with patients for Virtual Visits (Telemedicine).  Patients are able to view lab/test results, encounter notes, upcoming appointments, etc.  Non-urgent messages can be sent to your provider as well.   To learn more about what you can do with MyChart, go to [email or web address].    Your next appointment:   3 month(s)  The format for your next appointment:   In Person  Provider:   You may see Dr. Radford Pax or one of the following Advanced Practice Providers on your designated Care Team:    Melina Copa, PA-C  Ermalinda Barrios, PA-C

## 2019-10-17 ENCOUNTER — Encounter: Payer: Self-pay | Admitting: Cardiology

## 2020-01-02 ENCOUNTER — Telehealth: Payer: Self-pay | Admitting: Pulmonary Disease

## 2020-01-02 NOTE — Telephone Encounter (Signed)
Called and spoke with pt's daughter Corky Sing who stated pt has been wearing her O2 and has been having a lot of nasal congestion x3-4 days. She states that pt did use some nasal spray today 8/11 and was able to open up her nasal passages some and had some drainage.  George stated that pt does have a mild cough probably from the drainage. Pt is not coughing up any mucus. Pt does not have any complaints of a headache.  Asked how often pt is wearing the O2 and she states she is mainly wearing it at night when she feels like she is having trouble with her breathing.  Stated to Adrian for pt to continue to use the nasal spray as that should help clear up some of the congestion. Also stated to her that we would check with provider for further recommendations.  Pt does not have any complaints of headache and does not have any complaints of fever. Pt is using her medications as prescribed.  [place] wants to know if we have any recommendations to help with pt's nasal congestion. Sarah, please advise.

## 2020-01-02 NOTE — Telephone Encounter (Signed)
Please make sure they have not had change in taste or smell. If they have, please set them up for Covid Testing. I cannot see evidence of Covid Vaccination in her medical record. For sinus congestion, they can use a humidifier, or vaporizer. Showers with steam , stay hydrated,  use nasal saline or a Neti Pot with distilled or sterile water.  Prop yourself up at night  If patient gets worse will need to be seen.  Please make sure oxygen is humidified. If not please order humidification from DME.   Thanks

## 2020-01-02 NOTE — Telephone Encounter (Signed)
Attempted to call pt's daughter Corky Sing but unable to reach. Left message for her to return call.

## 2020-01-03 NOTE — Telephone Encounter (Signed)
Tried calling daughter and no answer- LMTCB

## 2020-01-10 ENCOUNTER — Other Ambulatory Visit: Payer: Self-pay

## 2020-01-10 MED ORDER — METOPROLOL TARTRATE 25 MG PO TABS: 25.0000 mg | ORAL_TABLET | Freq: Two times a day (BID) | ORAL | 2 refills | Status: DC

## 2020-01-11 ENCOUNTER — Other Ambulatory Visit: Payer: Self-pay

## 2020-01-22 ENCOUNTER — Ambulatory Visit: Payer: Medicare Other | Admitting: Cardiology

## 2020-02-20 ENCOUNTER — Other Ambulatory Visit: Payer: Self-pay | Admitting: *Deleted

## 2020-02-20 DIAGNOSIS — Z87891 Personal history of nicotine dependence: Secondary | ICD-10-CM

## 2020-02-20 DIAGNOSIS — F17210 Nicotine dependence, cigarettes, uncomplicated: Secondary | ICD-10-CM

## 2020-03-05 ENCOUNTER — Other Ambulatory Visit: Payer: Self-pay | Admitting: *Deleted

## 2020-03-05 MED ORDER — BREZTRI AEROSPHERE 160-9-4.8 MCG/ACT IN AERO: 2.0000 | INHALATION_SPRAY | Freq: Two times a day (BID) | RESPIRATORY_TRACT | 1 refills | Status: DC

## 2020-03-06 ENCOUNTER — Ambulatory Visit: Payer: Medicare Other | Admitting: Cardiology

## 2020-03-10 ENCOUNTER — Encounter: Payer: Self-pay | Admitting: Acute Care

## 2020-03-10 ENCOUNTER — Ambulatory Visit (INDEPENDENT_AMBULATORY_CARE_PROVIDER_SITE_OTHER)
Admission: RE | Admit: 2020-03-10 | Discharge: 2020-03-10 | Disposition: A | Discharge status: Home / Self Care | Payer: Medicare Other | Source: Ambulatory Visit | Attending: Acute Care | Admitting: Acute Care

## 2020-03-10 ENCOUNTER — Ambulatory Visit (INDEPENDENT_AMBULATORY_CARE_PROVIDER_SITE_OTHER): Payer: Medicare Other | Admitting: Acute Care

## 2020-03-10 ENCOUNTER — Other Ambulatory Visit: Payer: Self-pay

## 2020-03-10 VITALS — BP 122/74 | HR 72 | Temp 97.2°F | Ht 70.35 in | Wt 136.6 lb

## 2020-03-10 DIAGNOSIS — F17210 Nicotine dependence, cigarettes, uncomplicated: Secondary | ICD-10-CM | POA: Diagnosis not present

## 2020-03-10 DIAGNOSIS — Z87891 Personal history of nicotine dependence: Secondary | ICD-10-CM

## 2020-03-10 DIAGNOSIS — Z122 Encounter for screening for malignant neoplasm of respiratory organs: Secondary | ICD-10-CM

## 2020-03-10 NOTE — Patient Instructions (Signed)
Thank you for participating in the [facility] Cancer Screening Program. It was our pleasure to meet you today. We will call you with the results of your scan within the next few days. Your scan will be assigned a Lung RADS category score by the physicians reading the scans.  This Lung RADS score determines follow up scanning.  See below for description of categories, and follow up screening recommendations. We will be in touch to schedule your follow up screening annually or based on recommendations of our providers. We will fax a copy of your scan results to your Primary Care Physician, or the physician who referred you to the program, to ensure they have the results. Please call the office if you have any questions or concerns regarding your scanning experience or results.  Our office number is [phone]. Please speak with Denise Phelps, RN. She is our Lung Cancer Screening RN. If she is unavailable when you call, please have the office staff send her a message. She will return your call at her earliest convenience. Remember, if your scan is normal, we will scan you annually as long as you continue to meet the criteria for the program. (Age 55-77, Current smoker or smoker who has quit within the last 15 years). If you are a smoker, remember, quitting is the single most powerful action that you can take to decrease your risk of lung cancer and other pulmonary, breathing related problems. We know quitting is hard, and we are here to help.  Please let us know if there is anything we can do to help you meet your goal of quitting. If you are a former smoker, congratulations. We are proud of you! Remain smoke free! Remember you can refer friends or family members through the number above.  We will screen them to make sure they meet criteria for the program. Thank you for helping us take better care of you by participating in Lung Screening.  Lung RADS Categories:  Lung RADS 1: no nodules  or definitely non-concerning nodules.  Recommendation is for a repeat annual scan in 12 months.  Lung RADS 2:  nodules that are non-concerning in appearance and behavior with a very low likelihood of becoming an active cancer. Recommendation is for a repeat annual scan in 12 months.  Lung RADS 3: nodules that are probably non-concerning , includes nodules with a low likelihood of becoming an active cancer.  Recommendation is for a 6-month repeat screening scan. Often noted after an upper respiratory illness. We will be in touch to make sure you have no questions, and to schedule your 6-month scan.  Lung RADS 4 A: nodules with concerning findings, recommendation is most often for a follow up scan in 3 months or additional testing based on our provider's assessment of the scan. We will be in touch to make sure you have no questions and to schedule the recommended 3 month follow up scan.  Lung RADS 4 B:  indicates findings that are concerning. We will be in touch with you to schedule additional diagnostic testing based on our provider's  assessment of the scan.   

## 2020-03-10 NOTE — Progress Notes (Signed)
Shared Decision Making Visit Lung Cancer Screening Program [phone])   Eligibility:  Age 64 y.o.  Pack Years Smoking History Calculation 33 pack year smoking history (# packs/per year x # years smoked)  Recent History of coughing up blood  no  Unexplained weight loss? no ( >Than 15 pounds within the last 6 months )  Prior History Lung / other cancer No  (Diagnosis within the last 5 years already requiring surveillance chest CT Scans).  Smoking Status Current Smoker  Former Smokers: Years since quit: NA  Quit Date: NA  Visit Components:  Discussion included one or more decision making aids. yes  Discussion included risk/benefits of screening. yes  Discussion included potential follow up diagnostic testing for abnormal scans. yes  Discussion included meaning and risk of over diagnosis. yes  Discussion included meaning and risk of False Positives. yes  Discussion included meaning of total radiation exposure. yes  Counseling Included:  Importance of adherence to annual lung cancer LDCT screening. yes  Impact of comorbidities on ability to participate in the program. yes  Ability and willingness to under diagnostic treatment. yes  Smoking Cessation Counseling:  Current Smokers:   Discussed importance of smoking cessation. yes  Information about tobacco cessation classes and interventions provided to patient. yes  Patient provided with "ticket" for LDCT Scan. yes  Symptomatic Patient. no  Counseling  Diagnosis Code: Tobacco Use Z72.0  Asymptomatic Patient yes  Counseling (Intermediate counseling: > three minutes counseling) N0539  Former Smokers:   Discussed the importance of maintaining cigarette abstinence. yes  Diagnosis Code: Personal History of Nicotine Dependence. J67.341  Information about tobacco cessation classes and interventions provided to patient. Yes  Patient provided with "ticket" for LDCT Scan. yes  Written Order for Lung Cancer  Screening with LDCT placed in Epic. Yes (CT Chest Lung Cancer Screening Low Dose W/O CM) [id number] Z12.2-Screening of respiratory organs Z87.891-Personal history of nicotine dependence  BP 122/74 (BP Location: Left Arm, Cuff Size: Normal)   Pulse 72   Temp (!) 97.2 F (36.2 C) (Oral)   Ht 5' 10.35" (1.787 m)   Wt 136 lb 9.6 oz (62 kg)   SpO2 93%   BMI 19.41 kg/m    I have spent 25 minutes of face to face time with Ms. Kofoed discussing the risks and benefits of lung cancer screening. We viewed a power point together that explained in detail the above noted topics. We paused at intervals to allow for questions to be asked and answered to ensure understanding.We discussed that the single most powerful action that she can take to decrease her risk of developing lung cancer is to quit smoking. We discussed whether or not she is ready to commit to setting a quit date. We discussed options for tools to aid in quitting smoking including nicotine replacement therapy, non-nicotine medications, support groups, Quit Smart classes, and behavior modification. We discussed that often times setting smaller, more achievable goals, such as eliminating 1 cigarette a day for a week and then 2 cigarettes a day for a week can be helpful in slowly decreasing the number of cigarettes smoked. This allows for a sense of accomplishment as well as providing a clinical benefit. I gave her the " Be Stronger Than Your Excuses" card with contact information for community resources, classes, free nicotine replacement therapy, and access to mobile apps, text messaging, and on-line smoking cessation help. I have also given her my card and contact information in the event she needs to contact me. We  discussed the time and location of the scan, and that either Doroteo Glassman RN or I will call with the results within 24-48 hours of receiving them. I have offered her  a copy of the power point we viewed  as a resource in the event they  need reinforcement of the concepts we discussed today in the office. The patient verbalized understanding of all of  the above and had no further questions upon leaving the office. They have my contact information in the event they have any further questions.  I spent 3 minutes counseling on smoking cessation and the health risks of continued tobacco abuse.  I explained to the patient that there has been a high incidence of coronary artery disease noted on these exams. I explained that this is a non-gated exam therefore degree or severity cannot be determined. This patient is currently on statin therapy. I have asked the patient to follow-up with their PCP regarding any incidental finding of coronary artery disease and management with diet or medication as their PCP  feels is clinically indicated. The patient verbalized understanding of the above and had no further questions upon completion of the visit.      Magdalen Spatz, NP 03/10/2020 3:48 PM

## 2020-03-13 NOTE — Progress Notes (Signed)

## 2020-03-17 ENCOUNTER — Other Ambulatory Visit: Payer: Self-pay | Admitting: *Deleted

## 2020-03-17 DIAGNOSIS — F17210 Nicotine dependence, cigarettes, uncomplicated: Secondary | ICD-10-CM

## 2020-03-17 DIAGNOSIS — Z87891 Personal history of nicotine dependence: Secondary | ICD-10-CM

## 2020-04-16 ENCOUNTER — Ambulatory Visit: Payer: Medicare Other | Admitting: Cardiology

## 2020-05-01 ENCOUNTER — Ambulatory Visit: Payer: Medicare Other | Admitting: Cardiology

## 2020-05-01 ENCOUNTER — Other Ambulatory Visit: Payer: Self-pay

## 2020-05-01 ENCOUNTER — Encounter: Payer: Self-pay | Admitting: Cardiology

## 2020-05-01 VITALS — BP 144/90 | HR 98 | Ht 70.35 in | Wt 138.0 lb

## 2020-05-01 DIAGNOSIS — I700 Atherosclerosis of aorta: Secondary | ICD-10-CM | POA: Diagnosis not present

## 2020-05-01 DIAGNOSIS — E7800 Pure hypercholesterolemia, unspecified: Secondary | ICD-10-CM

## 2020-05-01 DIAGNOSIS — I2510 Atherosclerotic heart disease of native coronary artery without angina pectoris: Secondary | ICD-10-CM | POA: Diagnosis not present

## 2020-05-01 DIAGNOSIS — I10 Essential (primary) hypertension: Secondary | ICD-10-CM | POA: Diagnosis not present

## 2020-05-01 DIAGNOSIS — Z720 Tobacco use: Secondary | ICD-10-CM

## 2020-05-01 DIAGNOSIS — I272 Pulmonary hypertension, unspecified: Secondary | ICD-10-CM

## 2020-05-01 DIAGNOSIS — I359 Nonrheumatic aortic valve disorder, unspecified: Secondary | ICD-10-CM

## 2020-05-01 NOTE — Addendum Note (Signed)
Addended by: Antonieta Iba on: 05/01/2020 03:47 PM   Modules accepted: Orders

## 2020-05-01 NOTE — Patient Instructions (Signed)
Medication Instructions:  Your physician recommends that you continue on your current medications as directed. Please refer to the Current Medication list given to you today.  *If you need a refill on your cardiac medications before your next appointment, please call your pharmacy*   Lab Work: Fasting lipids and CMET If you have labs (blood work) drawn today and your tests are completely normal, you will receive your results only by: Marland Kitchen MyChart Message (if you have MyChart) OR . A paper copy in the mail If you have any lab test that is abnormal or we need to change your treatment, we will call you to review the results.   Testing/Procedures: Your physician has requested that you have an echocardiogram in May 2022. Echocardiography is a painless test that uses sound waves to create images of your heart. It provides your doctor with information about the size and shape of your heart and how well your heart's chambers and valves are working. This procedure takes approximately one hour. There are no restrictions for this procedure.  Follow-Up: At [facility], you and your health needs are our priority.  As part of our continuing mission to provide you with exceptional heart care, we have created designated Provider Care Teams.  These Care Teams include your primary Cardiologist (physician) and Advanced Practice Providers (APPs -  Physician Assistants and Nurse Practitioners) who all work together to provide you with the care you need, when you need it.  Your next appointment:   6 month(s)  The format for your next appointment:   In Person  Provider:   You may see [name], MD or one of the following Advanced Practice Providers on your designated Care Team:    Melina Copa, PA-C  Ermalinda Barrios, PA-C

## 2020-05-01 NOTE — Progress Notes (Signed)
Cardiology Office Consult Note    Date:  05/01/2020   ID:  Caroline Greer, DOB 19-May-1956, MRN [id number]  PCP:  [name], MD  Cardiologist:  [name], MD   Chief Complaint  Patient presents with  . Coronary Artery Disease  . Hyperlipidemia    History of Present Illness:   This is a 64yo female with a hx of COPD on home O2 3 L, HTN and ongoing tobacco abuse who had routine lung CA screening CT which showed coronary calcifications of all 3 coronary arteries including the LM as well as aortic atherosclerosis and AV calcifications.  She underwent coronary CTA 08/2019 showed moderate to severe multivessel CAD and cardiac cath 09/2019 showed 50% mRCA, 50% OM1, 50% prox to mid LCx, 30% prox to mid LAD and 50% D1 with moderate PHTN felt related to COPD and aggressive medical therapy was recommended.  She is here today for followup and is doing well.  She has some problems with indigestion and gas but taking Gas-X and belching relieves the discomfort. She has chronic DOE that seems stable.  She has to use her O2 at home when she cleans her house.  She denies any anginal chest pain or pressure, PND, orthopnea, LE edema, dizziness, palpitations or syncope. She is compliant with her meds and is tolerating meds with no SE.     Past Medical History:  Diagnosis Date  . Acute respiratory failure with hypoxia ([place]) 09/30/2018  . Constipation   . COPD exacerbation ([place]) 01/05/2019  . Depression with anxiety 09/30/2018  . Essential hypertension 09/30/2018  . Hypertension   . Tobacco dependence due to cigarettes 09/30/2018    Past Surgical History:  Procedure Laterality Date  . LAPAROTOMY  1974  . RIGHT/LEFT HEART CATH AND CORONARY ANGIOGRAPHY N/A 10/02/2019   Procedure: RIGHT/LEFT HEART CATH AND CORONARY ANGIOGRAPHY;  Surgeon: Sherren Mocha, MD;  Location: [facility];  Service: Cardiovascular;  Laterality: N/A;    Current Medications: Current Meds  Medication Sig  . ALPRAZolam (XANAX)  0.5 MG tablet Take 0.5 mg by mouth at bedtime as needed for sleep.  Marland Kitchen amLODipine (NORVASC) 5 MG tablet Take 1 tablet (5 mg total) by mouth daily.  Marland Kitchen aspirin EC 81 MG tablet Take 81 mg by mouth daily.  . benazepril (LOTENSIN) 40 MG tablet Take 40 mg by mouth daily.  . Budeson-Glycopyrrol-Formoterol (BREZTRI AEROSPHERE) 160-9-4.8 MCG/ACT AERO Inhale 2 puffs into the lungs in the morning and at bedtime.  Marland Kitchen ipratropium-albuterol (DUONEB) 0.5-2.5 (3) MG/3ML SOLN Take 3 mLs by nebulization 4 (four) times daily as needed (shortness of breath).   . loratadine (CLARITIN) 10 MG tablet Take 10 mg by mouth daily as needed for allergies.  . Multiple Vitamins-Minerals (CENTRUM SILVER 50+WOMEN) TABS Take 1 tablet by mouth daily.  . Omega-3 Fatty Acids (FISH OIL) 1000 MG CAPS Take 1,000 mg by mouth daily.  . polyethylene glycol (MIRALAX / GLYCOLAX) 17 g packet Take 17 g by mouth daily. (Patient taking differently: Take 17 g by mouth as needed for moderate constipation.)  . PROAIR HFA 108 (90 Base) MCG/ACT inhaler Inhale 2 puffs into the lungs every 6 (six) hours as needed for wheezing or shortness of breath. 90 day supply  . vitamin B-12 (CYANOCOBALAMIN) 500 MCG tablet Take 500 mcg by mouth daily.  . [DISCONTINUED] atorvastatin (LIPITOR) 20 MG tablet Take 1 tablet (20 mg total) by mouth daily.    Allergies:   Crab [shellfish allergy] and Erythromycin   Social History  Socioeconomic History  . Marital status: Single    Spouse name: Not on file  . Number of children: Not on file  . Years of education: Not on file  . Highest education level: Not on file  Occupational History  . Not on file  Tobacco Use  . Smoking status: Current Every Day Smoker    Packs/day: 0.80    Years: 43.00    Pack years: 34.40    Types: Cigarettes  . Smokeless tobacco: Never Used  Substance and Sexual Activity  . Alcohol use: Yes    Comment: gets 6 beers on a weekend and drinks them  . Drug use: Not Currently    Types:  Marijuana    Comment: tried to smoke some about a month ago  . Sexual activity: Not on file  Other Topics Concern  . Not on file  Social History Narrative  . Not on file   Social Determinants of Health   Financial Resource Strain: Not on file  Food Insecurity: Not on file  Transportation Needs: Not on file  Physical Activity: Not on file  Stress: Not on file  Social Connections: Not on file     Family History:  The patient's family history includes COPD in her mother.   ROS:   Please see the history of present illness.    ROS All other systems reviewed and are negative.  No flowsheet data found.     PHYSICAL EXAM:   VS:  BP (!) 144/90   Pulse 98   Ht 5' 10.35" (1.787 m)   Wt 138 lb (62.6 kg)   BMI 19.60 kg/m    GEN: Well nourished, well developed, in no acute distress  HEENT: normal  Neck: no JVD, carotid bruits, or masses Cardiac: RRR; no murmurs, rubs, or gallops,no edema.  Intact distal pulses bilaterally.  Respiratory:  clear to auscultation bilaterally, normal work of breathing GI: soft, nontender, nondistended, + BS MS: no deformity or atrophy  Skin: warm and dry, no rash Neuro:  Alert and Oriented x 3, Strength and sensation are intact Psych: euthymic mood, full affect  Wt Readings from Last 3 Encounters:  05/01/20 138 lb (62.6 kg)  03/10/20 136 lb 9.6 oz (62 kg)  10/16/19 134 lb (60.8 kg)      Studies/Labs Reviewed:   EKG:  EKG is not ordered today.    2D echo 07/2019 IMPRESSIONS  1. Left ventricular ejection fraction, by estimation, is 65 to 70%. The  left ventricle has normal function. The left ventricle has no regional  wall motion abnormalities. There is mild left ventricular hypertrophy.  Left ventricular diastolic parameters  are consistent with Grade I diastolic dysfunction (impaired relaxation).  2. Right ventricular systolic function is normal. The right ventricular  size is normal.  3. The mitral valve is abnormal. Trivial mitral  valve regurgitation.  4. The aortic valve is tricuspid. Aortic valve regurgitation is not  visualized. Mild to moderate aortic valve sclerosis/calcification is  present, without any evidence of aortic stenosis.  5. The inferior vena cava is normal in size with greater than 50%  respiratory variability, suggesting right atrial pressure of 3 mmHg.   Recent Labs: 09/26/2019: BUN 12; Creatinine, Ser 0.77; Platelets 236 10/02/2019: Hemoglobin 17.3; Potassium 3.5; Sodium 141   Lipid Panel No results found for: CHOL, TRIG, HDL, CHOLHDL, VLDL, LDLCALC, LDLDIRECT  Additional studies/ records that were reviewed today include:  Office notes from PCP    ASSESSMENT:    1. Coronary artery  calcification seen on CAT scan   2. Essential hypertension   3. Tobacco abuse   4. Aortic atherosclerosis ([place])   5. Aortic valve calcification   6. Pulmonary HTN ([place])   7. Pure hypercholesterolemia      PLAN:  In order of problems listed above:  1.  ASCAD -she denies any anginal sx -coronary CTA 08/2019 showed moderate to severe multivessel CAD and cardiac cath 09/2019 showed 50% mRCA, 50% OM1, 50% prox to mid LCx, 30% prox to mid LAD and 50% D1 with moderate PHTN felt related to COPD -continue aggressive medical therapy -continue ASA, statin, BB and ACE I  2.  HTN -BP borderline controlled but better at home -continue amlodipine 5mg  daily, Lopressor 25mg  BID and Benazepril 40mg  daily -SCr normal at 0.77 in May 2021  3.  Ongoing tobacco abuse/COPD -she continues to smoke sporadically -tobacco cessation counseling was provided  -O2 sats 84% on RA >>she did not bring her O2 with her today and is on 3L O2 at home  4.  Aortic atherosclerosis -noted on Chest CT with no evidence of aortic aneurysm -her BP is fairly well controlled -continue statin  5.  AV leaflet calcifications -noted on Chest CT -2D echo showed mild to moderate AVSC with no AS  6. Pulmonary HTN -moderate by cath 09/2019 and  felt related to COPD -check 2D echo in 09/2020 to make sure this remains stable  7.  HLD -LDL goal < 70 -check FLP and ALT -continue atorvastatin  Followup with me in 6 months  Medication Adjustments/Labs and Tests Ordered: Current medicines are reviewed at length with the patient today.  Concerns regarding medicines are outlined above.  Medication changes, Labs and Tests ordered today are listed in the Patient Instructions below.  There are no Patient Instructions on file for this visit.   Signed, [name], MD  05/01/2020 3:44 PM    [address], [place],   [address] Phone: [phone]; Fax: [phone]

## 2020-05-07 ENCOUNTER — Other Ambulatory Visit: Payer: Medicare Other

## 2020-05-08 ENCOUNTER — Other Ambulatory Visit: Payer: Medicare Other

## 2020-05-13 ENCOUNTER — Other Ambulatory Visit: Payer: Self-pay

## 2020-05-13 ENCOUNTER — Other Ambulatory Visit: Payer: Medicare Other

## 2020-05-13 DIAGNOSIS — I272 Pulmonary hypertension, unspecified: Secondary | ICD-10-CM

## 2020-05-13 DIAGNOSIS — E7800 Pure hypercholesterolemia, unspecified: Secondary | ICD-10-CM

## 2020-05-14 LAB — COMPREHENSIVE METABOLIC PANEL
ALT: 24 IU/L (ref 0–32)
AST: 23 IU/L (ref 0–40)
Albumin/Globulin Ratio: 1.5 (ref 1.2–2.2)
Albumin: 4.3 g/dL (ref 3.8–4.8)
Alkaline Phosphatase: 72 IU/L (ref 44–121)
BUN/Creatinine Ratio: 8 — ABNORMAL LOW (ref 12–28)
BUN: 6 mg/dL — ABNORMAL LOW (ref 8–27)
Bilirubin Total: 0.4 mg/dL (ref 0.0–1.2)
CO2: 27 mmol/L (ref 20–29)
Calcium: 9.3 mg/dL (ref 8.7–10.3)
Chloride: 97 mmol/L (ref 96–106)
Creatinine, Ser: 0.76 mg/dL (ref 0.57–1.00)
GFR calc Af Amer: 95 mL/min/{1.73_m2} (ref >59)
GFR calc non Af Amer: 82 mL/min/{1.73_m2} (ref >59)
Globulin, Total: 2.8 g/dL (ref 1.5–4.5)
Glucose: 79 mg/dL (ref 65–99)
Potassium: 4 mmol/L (ref 3.5–5.2)
Sodium: 139 mmol/L (ref 134–144)
Total Protein: 7.1 g/dL (ref 6.0–8.5)

## 2020-05-14 LAB — LIPID PANEL
Chol/HDL Ratio: 1.7 ratio (ref 0.0–4.4)
Cholesterol, Total: 141 mg/dL (ref 100–199)
HDL: 84 mg/dL (ref >39)
LDL Chol Calc (NIH): 40 mg/dL (ref 0–99)
Triglycerides: 92 mg/dL (ref 0–149)
VLDL Cholesterol Cal: 17 mg/dL (ref 5–40)

## 2020-09-29 ENCOUNTER — Ambulatory Visit (HOSPITAL_COMMUNITY): Payer: Medicare Other | Attending: Cardiology

## 2020-09-30 ENCOUNTER — Encounter (HOSPITAL_COMMUNITY): Payer: Self-pay | Admitting: Cardiology

## 2020-10-26 ENCOUNTER — Other Ambulatory Visit: Payer: Self-pay | Admitting: Cardiology

## 2020-10-28 ENCOUNTER — Other Ambulatory Visit: Payer: Self-pay | Admitting: *Deleted

## 2020-10-28 MED ORDER — METOPROLOL TARTRATE 25 MG PO TABS: 25.0000 mg | ORAL_TABLET | Freq: Two times a day (BID) | ORAL | 1 refills | Status: DC

## 2020-10-30 ENCOUNTER — Ambulatory Visit: Payer: Medicare Other | Admitting: Cardiology

## 2020-11-13 ENCOUNTER — Encounter (INDEPENDENT_AMBULATORY_CARE_PROVIDER_SITE_OTHER): Payer: Self-pay

## 2020-11-13 ENCOUNTER — Other Ambulatory Visit: Payer: Self-pay

## 2020-11-13 ENCOUNTER — Ambulatory Visit (HOSPITAL_COMMUNITY): Payer: Medicare Other | Attending: Cardiology

## 2020-11-13 DIAGNOSIS — I272 Pulmonary hypertension, unspecified: Secondary | ICD-10-CM | POA: Diagnosis present

## 2020-11-14 LAB — ECHOCARDIOGRAM COMPLETE
AR max vel: 5.29 cm2
AV Area VTI: 5.14 cm2
AV Area mean vel: 5.07 cm2
AV Mean grad: 2 mmHg
AV Peak grad: 3.4 mmHg
Ao pk vel: 0.92 m/s
Area-P 1/2: 2.8 cm2
S' Lateral: 2.5 cm

## 2020-11-16 ENCOUNTER — Other Ambulatory Visit: Payer: Self-pay | Admitting: Cardiology

## 2020-11-28 ENCOUNTER — Other Ambulatory Visit: Payer: Self-pay | Admitting: Cardiology

## 2021-01-30 ENCOUNTER — Ambulatory Visit: Payer: Medicare Other | Admitting: Cardiology

## 2021-02-21 ENCOUNTER — Other Ambulatory Visit: Payer: Self-pay | Admitting: Cardiology

## 2021-03-13 ENCOUNTER — Encounter: Payer: Self-pay | Admitting: Acute Care

## 2021-03-30 ENCOUNTER — Other Ambulatory Visit: Payer: Self-pay | Admitting: Internal Medicine

## 2021-03-30 DIAGNOSIS — Z1231 Encounter for screening mammogram for malignant neoplasm of breast: Secondary | ICD-10-CM

## 2021-04-01 ENCOUNTER — Other Ambulatory Visit: Payer: Self-pay

## 2021-04-01 DIAGNOSIS — Z87891 Personal history of nicotine dependence: Secondary | ICD-10-CM

## 2021-04-01 DIAGNOSIS — F17210 Nicotine dependence, cigarettes, uncomplicated: Secondary | ICD-10-CM

## 2021-04-03 ENCOUNTER — Other Ambulatory Visit: Payer: Self-pay | Admitting: Internal Medicine

## 2021-04-03 ENCOUNTER — Ambulatory Visit
Admission: RE | Admit: 2021-04-03 | Discharge: 2021-04-03 | Disposition: A | Discharge status: Home / Self Care | Payer: Medicare Other | Source: Ambulatory Visit | Attending: Internal Medicine | Admitting: Internal Medicine

## 2021-04-03 ENCOUNTER — Other Ambulatory Visit: Payer: Self-pay

## 2021-04-03 DIAGNOSIS — Z1231 Encounter for screening mammogram for malignant neoplasm of breast: Secondary | ICD-10-CM

## 2021-04-03 DIAGNOSIS — N6310 Unspecified lump in the right breast, unspecified quadrant: Secondary | ICD-10-CM

## 2021-05-03 ENCOUNTER — Other Ambulatory Visit: Payer: Self-pay | Admitting: Cardiology

## 2021-05-20 ENCOUNTER — Other Ambulatory Visit: Payer: Self-pay

## 2021-05-22 ENCOUNTER — Encounter: Payer: Self-pay | Admitting: Internal Medicine

## 2021-05-22 ENCOUNTER — Telehealth: Payer: Self-pay | Admitting: Hematology

## 2021-05-22 NOTE — Telephone Encounter (Signed)
Spoke to patient to confirm upcoming afternoon clinic appointment for 1/11, packet to be sent by [facility]

## 2021-05-26 ENCOUNTER — Other Ambulatory Visit: Payer: Medicare Other

## 2021-05-27 ENCOUNTER — Other Ambulatory Visit: Payer: Self-pay | Admitting: Cardiology

## 2021-05-31 IMAGING — CT CT CHEST W/O CM
2 of 3 series · 15 of 36 positions shown, 18 images · non-contrast
Comparison: Chest CT 09/30/2018.

CLINICAL DATA: 63-year-old female with history of pulmonary nodule.
Follow-up study. History of COPD. Recently decreased oxygen
saturations.

EXAM:
CT CHEST WITHOUT CONTRAST
TECHNIQUE: Multidetector CT imaging of the chest was performed following the
standard protocol without IV contrast.

[Series 2: thorax · axial · 0.70mm/px · z∈[-302,+2]mm · 12 of 180 slices shown, 15 images]
[im 14/180  mediastinal]
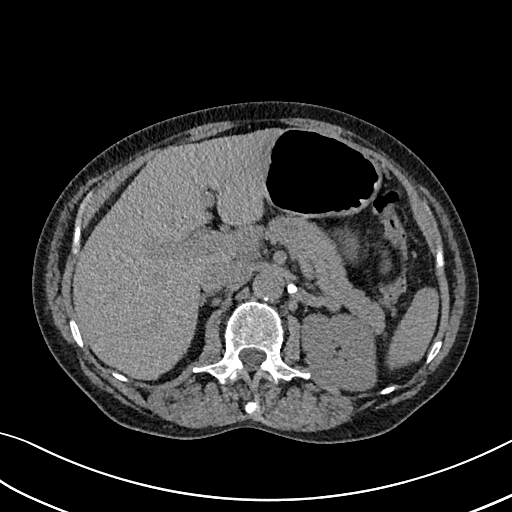
[im 14/180  lung]
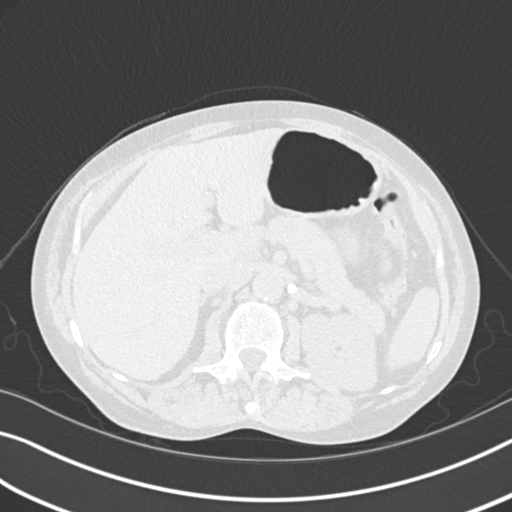
[im 27/180  lung]
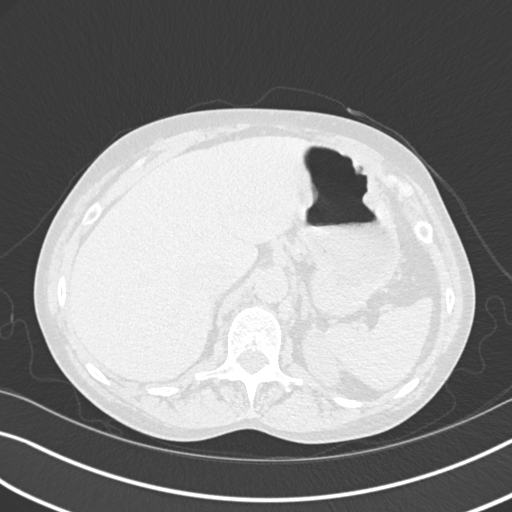
[im 40/180  lung]
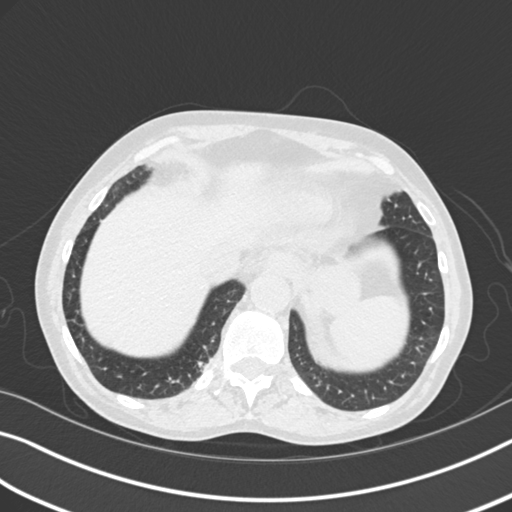
[im 54/180  lung]
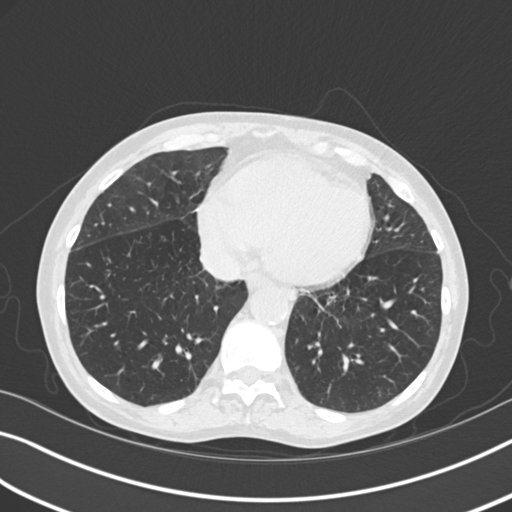
[im 67/180  mediastinal]
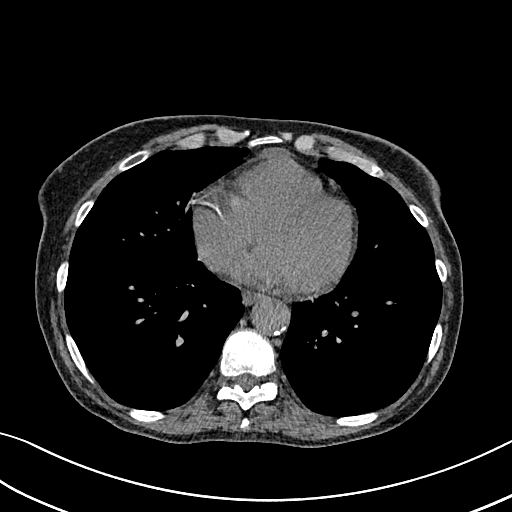
[im 67/180  lung]
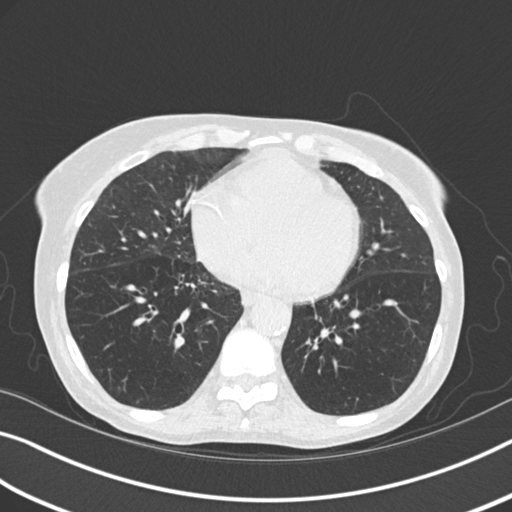
[im 80/180  lung]
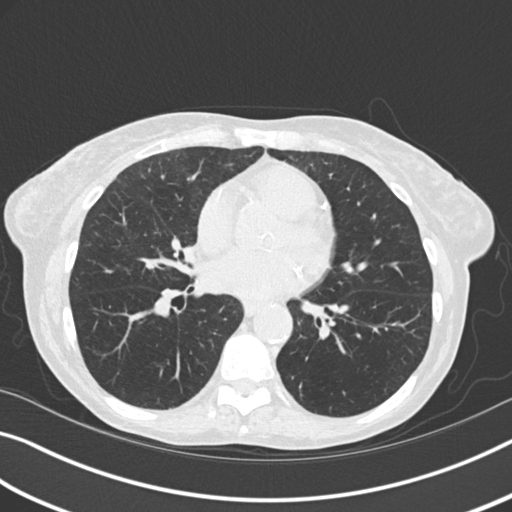
[im 100/180  lung]
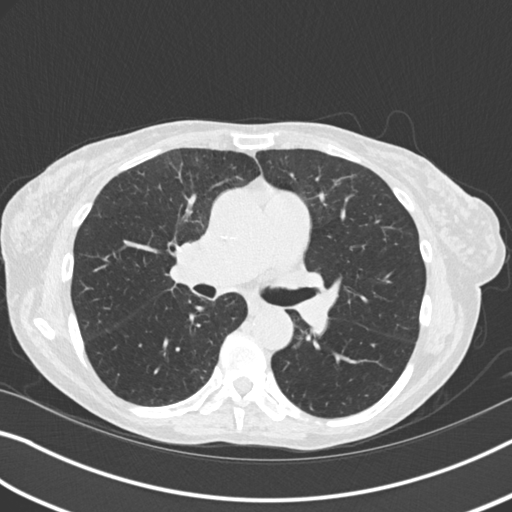
[im 113/180  lung]
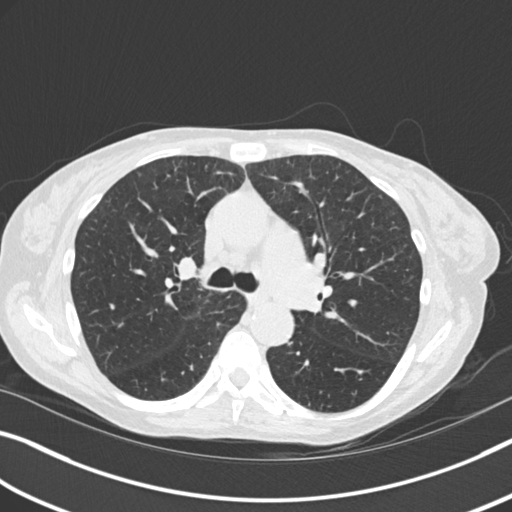
[im 126/180  mediastinal]
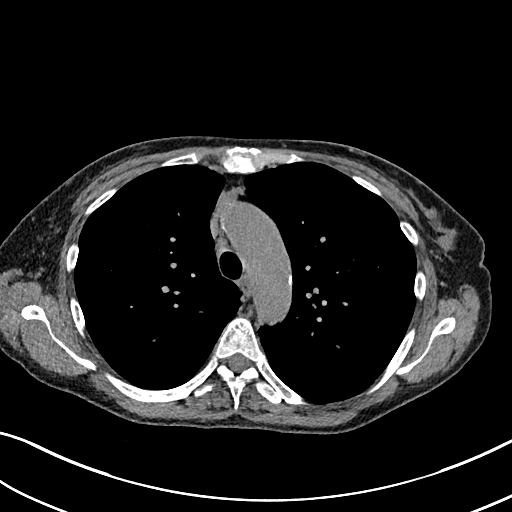
[im 126/180  lung]
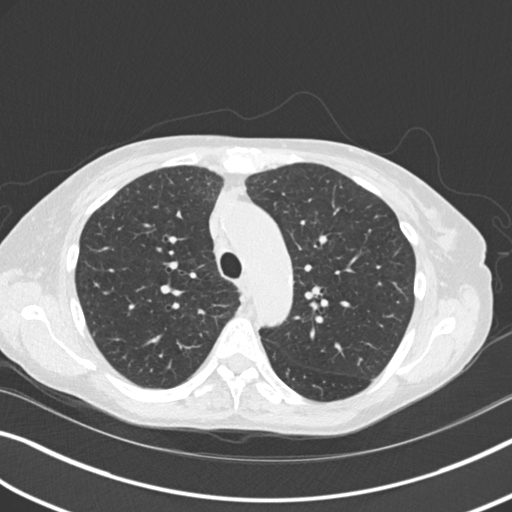
[im 140/180  lung]
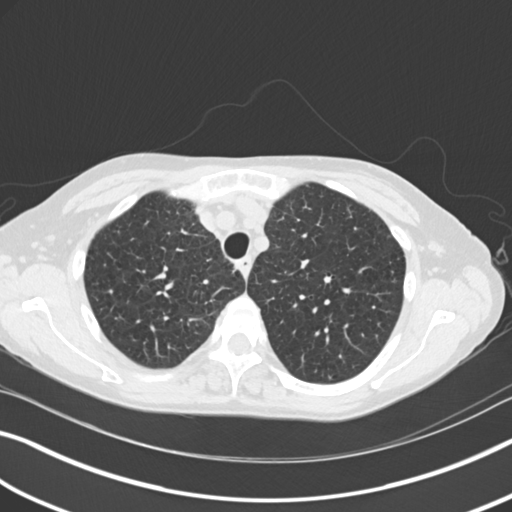
[im 153/180  lung]
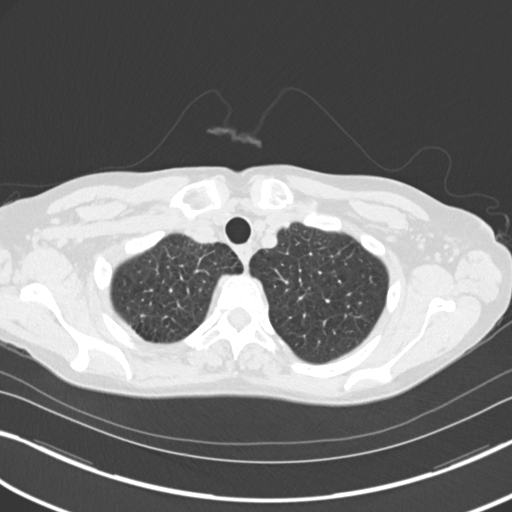
[im 166/180  lung]
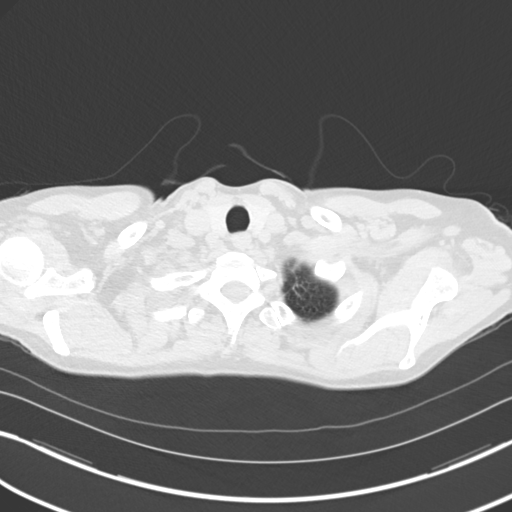

[Series 5: coronal · coronal · 0.65mm/px · 3 of 118 slices shown]
[im 24/118  lung]
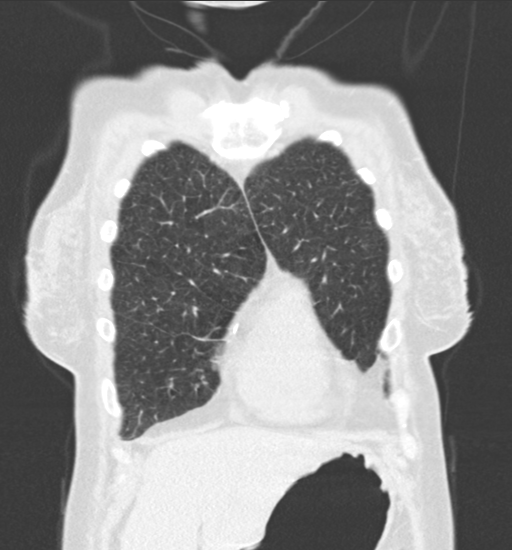
[im 47/118  lung]
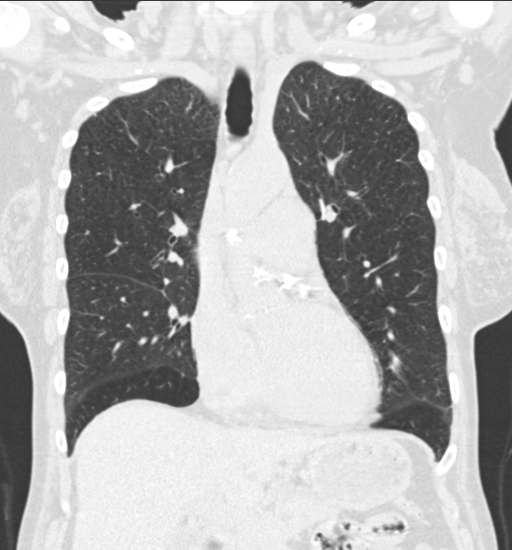
[im 71/118  lung]
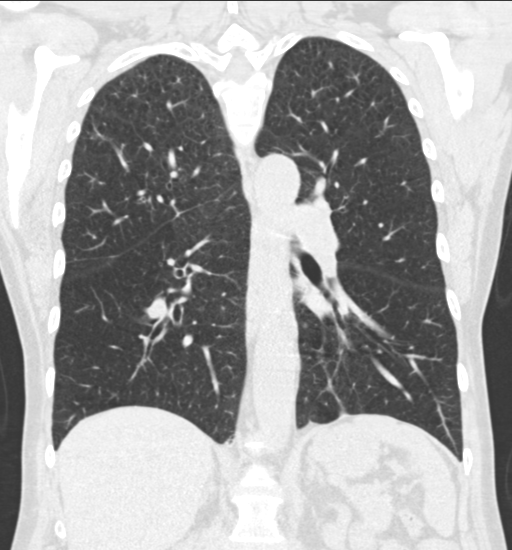

[15 of 36 positions shown; findings below may reference images not displayed]

FINDINGS: Cardiovascular: Heart size is normal. There is no significant
pericardial fluid, thickening or pericardial calcification. There is
aortic atherosclerosis, as well as atherosclerosis of the great
vessels of the mediastinum and the coronary arteries, including
calcified atherosclerotic plaque in the left main, left anterior
descending, left circumflex and right coronary arteries.
Calcifications of the aortic valve.

Mediastinum/Nodes: No pathologically enlarged mediastinal or hilar
lymph nodes. Please note that accurate exclusion of hilar adenopathy
is limited on noncontrast CT scans. Esophagus is unremarkable in
appearance. No axillary lymphadenopathy.

Lungs/Pleura: Again noted are several tiny pulmonary nodules in the
lungs bilaterally, largest of which is in the right upper lobe
measuring 5 mm (axial image 35 of series 3), slightly decreased
compared to the prior study. Other previously noted nodules in the
left upper lobe (axial image 31 of series 3 where there is a 3 mm
nodule), and in the left lower lobe (axial image 91 of series 3
where there is a 2 mm nodule) both also appear slightly decreased in
size compared to the prior examination. No other new suspicious
appearing pulmonary nodules or masses are noted. No acute
consolidative airspace disease. No pleural effusions. Diffuse
bronchial wall thickening with mild to moderate centrilobular and
paraseptal emphysema.

Upper Abdomen: Aortic atherosclerosis.

Musculoskeletal: There are no aggressive appearing lytic or blastic
lesions noted in the visualized portions of the skeleton.
IMPRESSION: 1. All previously noted pulmonary nodules appear slightly decreased
in size compared to the prior examination, indicative of a benign
etiology.
2. Diffuse bronchial wall thickening with mild to moderate
centrilobular and paraseptal emphysema; imaging findings compatible
with the reported clinical history of COPD.
3. Aortic atherosclerosis, in addition to left main and 3 vessel
coronary artery disease. Please note that although the presence of
coronary artery calcium documents the presence of coronary artery
disease, the severity of this disease and any potential stenosis
cannot be assessed on this non-gated CT examination. Assessment for
potential risk factor modification, dietary therapy or pharmacologic
therapy may be warranted, if clinically indicated.
4. There are calcifications of the aortic valve. Echocardiographic
correlation for evaluation of potential valvular dysfunction may be
warranted if clinically indicated.

Aortic Atherosclerosis (0ECDG-YCU.U) and Emphysema (0ECDG-O2C.1).

## 2021-06-01 ENCOUNTER — Encounter: Payer: Self-pay | Admitting: *Deleted

## 2021-06-01 ENCOUNTER — Other Ambulatory Visit: Payer: Self-pay | Admitting: Cardiology

## 2021-06-01 DIAGNOSIS — Z170 Estrogen receptor positive status [ER+]: Secondary | ICD-10-CM

## 2021-06-01 DIAGNOSIS — C50411 Malignant neoplasm of upper-outer quadrant of right female breast: Secondary | ICD-10-CM | POA: Insufficient documentation

## 2021-06-02 NOTE — Progress Notes (Signed)
Radiation Oncology         [phone] ________________________________  Multidisciplinary Breast Oncology Clinic [facility]) Initial Outpatient Consultation  Name: Caroline Greer MRN: [id number]  Date: 06/03/2021  DOB: [birth year]  CC:Caroline Panda, MD  Caroline Kussmaul, MD   REFERRING PHYSICIAN: Autumn Messing III, MD  DIAGNOSIS: The encounter diagnosis was Malignant neoplasm of upper-outer quadrant of right breast in female, estrogen receptor positive ([place]).  Stage IA (cT1b, cN0, cM0) Right Breast UOQ, Invasive Lobular Mammary Carcinoma, ER+ / PR+ / Her2-, Grade 2    ICD-10-CM   1. Malignant neoplasm of upper-outer quadrant of right breast in female, estrogen receptor positive ([place])  C50.411    Z17.0       HISTORY OF PRESENT ILLNESS::Caroline Greer is a 66 y.o. female who is presenting to the office today for evaluation of her newly diagnosed breast cancer. She is accompanied by herself. She is doing well overall.   The patient presented with a 3 week history of a palpable right breast lump. Subsequently, she underwent a bilateral diagnostic mammogram at [facility] on 05/04/21 showing a suspicious 1.1 cm architectural distortion in the right breast. Right breast US at [facility] on that same date further revealed the right breast mass as taller than wide, and highly suggestive of malignancy, measuring 1.2 x 0.7 x 0.8 cm.   Right breast biopsy at the 11 o'clock position, 8 cmfn, on 05/20/21 showed: grade 2 invasive mammary (lobular) carcinoma involving 4/4 biopsy fragments, measuring 0.8 cm in the max extent. Prognostic indicators significant for: estrogen receptor, 90% positive and progesterone receptor, 100% positive, both with strong staining intensity. Proliferation marker Ki67 at 5%. HER2 negative.  Menarche: 66 years old Age at first live birth: 66 years old GP: 2 LMP: November 1995 Contraceptive: never used  HRT: never used   The patient was referred today for presentation in the  multidisciplinary conference.  Radiology studies and pathology slides were presented there for review and discussion of treatment options.  A consensus was discussed regarding potential next steps.  PREVIOUS RADIATION THERAPY: No  PAST MEDICAL HISTORY:  Past Medical History:  Diagnosis Date   Acute respiratory failure with hypoxia ([place]) 09/30/2018   Constipation    COPD exacerbation ([place]) 01/05/2019   Depression with anxiety 09/30/2018   Essential hypertension 09/30/2018   Hypertension    Tobacco dependence due to cigarettes 09/30/2018    PAST SURGICAL HISTORY: Past Surgical History:  Procedure Laterality Date   LAPAROTOMY  1974   RIGHT/LEFT HEART CATH AND CORONARY ANGIOGRAPHY N/A 10/02/2019   Procedure: RIGHT/LEFT HEART CATH AND CORONARY ANGIOGRAPHY;  Surgeon: Sherren Mocha, MD;  Location: [facility];  Service: Cardiovascular;  Laterality: N/A;    FAMILY HISTORY:  Family History  Problem Relation Age of Onset   COPD Mother    Throat cancer Maternal Grandmother    Prostate cancer Other    Prostate cancer Other    Prostate cancer Other    Esophageal cancer Maternal Aunt     SOCIAL HISTORY:  Social History   Socioeconomic History   Marital status: Single    Spouse name: Not on file   Number of children: Not on file   Years of education: Not on file   Highest education level: Not on file  Occupational History   Not on file  Tobacco Use   Smoking status: Every Day    Packs/day: 0.80    Years: 43.00    Pack years: 34.40    Types: Cigarettes  Smokeless tobacco: Never  Substance and Sexual Activity   Alcohol use: Yes    Comment: gets 6 beers on a weekend and drinks them   Drug use: Not Currently    Types: Marijuana    Comment: tried to smoke some about a month ago   Sexual activity: Not on file  Other Topics Concern   Not on file  Social History Narrative   Not on file   Social Determinants of Health   Financial Resource Strain: Not on file  Food  Insecurity: Not on file  Transportation Needs: Not on file  Physical Activity: Not on file  Stress: Not on file  Social Connections: Not on file    ALLERGIES:  Allergies  Allergen Reactions   Crab [Shellfish Allergy] Swelling   Erythromycin Other (See Comments)    convulsion    MEDICATIONS:  Current Outpatient Medications  Medication Sig Dispense Refill   ALPRAZolam (XANAX) 0.5 MG tablet Take 0.5 mg by mouth at bedtime as needed for sleep. (Patient not taking: Reported on 06/03/2021)     amLODipine (NORVASC) 5 MG tablet Take 1 tablet (5 mg total) by mouth daily. Pt needs to contact our office to schedule appt for further refills - 2nd attempt 30 tablet 0   aspirin EC 81 MG tablet Take 81 mg by mouth daily.     atorvastatin (LIPITOR) 10 MG tablet Take 1 tablet (10 mg total) by mouth daily. Pt needs to make appt with provider for further refills - 1st attempt 30 tablet 0   benazepril (LOTENSIN) 40 MG tablet Take 40 mg by mouth daily.     Budeson-Glycopyrrol-Formoterol (BREZTRI AEROSPHERE) 160-9-4.8 MCG/ACT AERO Inhale 2 puffs into the lungs in the morning and at bedtime. (Patient not taking: Reported on 06/03/2021) 32.1 g 1   ipratropium-albuterol (DUONEB) 0.5-2.5 (3) MG/3ML SOLN Take 3 mLs by nebulization 4 (four) times daily as needed (shortness of breath).  (Patient not taking: Reported on 06/03/2021)     loratadine (CLARITIN) 10 MG tablet Take 10 mg by mouth daily as needed for allergies.     metoprolol tartrate (LOPRESSOR) 25 MG tablet Take 1 tablet (25 mg total) by mouth 2 (two) times daily. Please call to make an appointment with Dr. Radford Pax for additional refills [phone]. Thank you. 1st attempt 60 tablet 0   Multiple Vitamins-Minerals (CENTRUM SILVER 50+WOMEN) TABS Take 1 tablet by mouth daily.     Omega-3 Fatty Acids (FISH OIL) 1000 MG CAPS Take 1,000 mg by mouth daily.     polyethylene glycol (MIRALAX / GLYCOLAX) 17 g packet Take 17 g by mouth daily. (Patient not taking:  Reported on 06/03/2021) 14 each 0   PROAIR HFA 108 (90 Base) MCG/ACT inhaler Inhale 2 puffs into the lungs every 6 (six) hours as needed for wheezing or shortness of breath. 90 day supply (Patient not taking: Reported on 06/03/2021) 18 g 3   vitamin B-12 (CYANOCOBALAMIN) 500 MCG tablet Take 500 mcg by mouth daily.     No current facility-administered medications for this encounter.    REVIEW OF SYSTEMS: A 10+ POINT REVIEW OF SYSTEMS WAS OBTAINED including neurology, dermatology, psychiatry, cardiac, respiratory, lymph, extremities, GI, GU, musculoskeletal, constitutional, reproductive, HEENT. On the provided form, she reports wearing glasses, sinus problems, dental problems, shortness of breath when walking and using stairs, sleeping with 2 pillows, productive cough, poor appetite, change in stool habits, dribbling urine, palpable breast lump, and hot flashes. She denies any other symptoms.    PHYSICAL EXAM:  Vitals with BMI 06/03/2021  Height 5' 10.35"  Weight 130 lbs 6 oz  BMI 60.73  Systolic 710  Diastolic 90  Pulse 83    Lungs are clear to auscultation bilaterally. Heart has regular rate and rhythm. No palpable cervical, supraclavicular, or axillary adenopathy. Abdomen soft, non-tender, normal bowel sounds. Breast: Left breast with no palpable mass, nipple discharge, or bleeding. Right breast with some induration in the superior aspect of the breast. No visible biopsy site at this point. No nipple discharge or bleeding  3L supplemental O2 in place during physical exam.  In the recumbent position she was noted to have some difficulties with her breathing.  KPS = 70  100 - Normal; no complaints; no evidence of disease. 90   - Able to carry on normal activity; minor signs or symptoms of disease. 80   - Normal activity with effort; some signs or symptoms of disease. 18   - Cares for self; unable to carry on normal activity or to do active work. 60   - Requires occasional assistance, but  is able to care for most of his personal needs. 50   - Requires considerable assistance and frequent medical care. 8   - Disabled; requires special care and assistance. 85   - Severely disabled; hospital admission is indicated although death not imminent. 56   - Very sick; hospital admission necessary; active supportive treatment necessary. 10   - Moribund; fatal processes progressing rapidly. 0     - Dead  Karnofsky DA, Abelmann Sampson, Craver LS and Burchenal [facility] [phone] The use of the nitrogen mustards in the palliative treatment of carcinoma: with particular reference to bronchogenic carcinoma Cancer 1 634-56  LABORATORY DATA:  Lab Results  Component Value Date   WBC 11.2 (H) 06/03/2021   HGB 15.6 (H) 06/03/2021   HCT 47.0 (H) 06/03/2021   MCV 97.9 06/03/2021   PLT 247 06/03/2021   Lab Results  Component Value Date   NA 138 06/03/2021   K 3.8 06/03/2021   CL 98 06/03/2021   CO2 33 (H) 06/03/2021   Lab Results  Component Value Date   ALT 14 06/03/2021   AST 15 06/03/2021   ALKPHOS 48 06/03/2021   BILITOT 0.5 06/03/2021    PULMONARY FUNCTION TEST:   Recent Review Flowsheet Data   There is no flowsheet data to display.     RADIOGRAPHY: No results found.    IMPRESSION: Stage IA (cT1b, cN0, cM0) Right Breast UOQ, Invasive Lobular/Mammary Carcinoma, ER+ / PR+ / Her2-, Grade 2   Patient will be a good candidate for breast conservation with radiotherapy to the right breast. We discussed the general course of radiation, potential side effects, and toxicities with radiation and the patient is interested in this approach.   Patient does have significant respiratory issues and is oxygen dependent on 3 L continuously.  Depending on final pathologic results she may wish to go surgery and adjuvant hormonal therapy and forego radiation therapy given her significant respiratory issues.  The patient would get an MRI, however, her O2 requirements make this difficult.   PLAN:  Right  lumpectomy and SNL Adjuvant radiation therapy doubtful given the patient's significant pulmonary issues Aromatase inhibitor     ------------------------------------------------  Caroline Promise, PhD, MD  This document serves as a record of services personally performed by Caroline Pray, MD. It was created on his behalf by Caroline Greer, a trained medical scribe. The creation of this record is based on the scribe's  personal observations and the provider's statements to them. This document has been checked and approved by the attending provider.

## 2021-06-03 ENCOUNTER — Inpatient Hospital Stay (HOSPITAL_BASED_OUTPATIENT_CLINIC_OR_DEPARTMENT_OTHER): Payer: Medicare Other | Admitting: Hematology and Oncology

## 2021-06-03 ENCOUNTER — Ambulatory Visit
Admission: RE | Admit: 2021-06-03 | Discharge: 2021-06-03 | Disposition: A | Discharge status: Home / Self Care | Payer: Medicare Other | Source: Ambulatory Visit | Attending: Radiation Oncology | Admitting: Radiation Oncology

## 2021-06-03 ENCOUNTER — Encounter: Payer: Self-pay | Admitting: Genetic Counselor

## 2021-06-03 ENCOUNTER — Encounter: Payer: Self-pay | Admitting: *Deleted

## 2021-06-03 ENCOUNTER — Inpatient Hospital Stay: Payer: Medicare Other | Attending: Hematology and Oncology

## 2021-06-03 ENCOUNTER — Ambulatory Visit: Payer: Medicare Other | Attending: General Surgery | Admitting: Physical Therapy

## 2021-06-03 ENCOUNTER — Other Ambulatory Visit: Payer: Self-pay

## 2021-06-03 DIAGNOSIS — I10 Essential (primary) hypertension: Secondary | ICD-10-CM | POA: Insufficient documentation

## 2021-06-03 DIAGNOSIS — C50411 Malignant neoplasm of upper-outer quadrant of right female breast: Secondary | ICD-10-CM

## 2021-06-03 DIAGNOSIS — R293 Abnormal posture: Secondary | ICD-10-CM | POA: Diagnosis present

## 2021-06-03 DIAGNOSIS — Z170 Estrogen receptor positive status [ER+]: Secondary | ICD-10-CM | POA: Diagnosis present

## 2021-06-03 DIAGNOSIS — J449 Chronic obstructive pulmonary disease, unspecified: Secondary | ICD-10-CM | POA: Insufficient documentation

## 2021-06-03 DIAGNOSIS — Z1379 Encounter for other screening for genetic and chromosomal anomalies: Secondary | ICD-10-CM | POA: Insufficient documentation

## 2021-06-03 LAB — CBC WITH DIFFERENTIAL (CANCER CENTER ONLY)
Abs Immature Granulocytes: 0.03 10*3/uL (ref 0.00–0.07)
Basophils Absolute: 0 10*3/uL (ref 0.0–0.1)
Basophils Relative: 0 %
Eosinophils Absolute: 0.2 10*3/uL (ref 0.0–0.5)
Eosinophils Relative: 1 %
HCT: 47 % — ABNORMAL HIGH (ref 36.0–46.0)
Hemoglobin: 15.6 g/dL — ABNORMAL HIGH (ref 12.0–15.0)
Immature Granulocytes: 0 %
Lymphocytes Relative: 22 %
Lymphs Abs: 2.5 10*3/uL (ref 0.7–4.0)
MCH: 32.5 pg (ref 26.0–34.0)
MCHC: 33.2 g/dL (ref 30.0–36.0)
MCV: 97.9 fL (ref 80.0–100.0)
Monocytes Absolute: 0.7 10*3/uL (ref 0.1–1.0)
Monocytes Relative: 6 %
Neutro Abs: 7.8 10*3/uL — ABNORMAL HIGH (ref 1.7–7.7)
Neutrophils Relative %: 71 %
Platelet Count: 247 10*3/uL (ref 150–400)
RBC: 4.8 MIL/uL (ref 3.87–5.11)
RDW: 13.2 % (ref 11.5–15.5)
WBC Count: 11.2 10*3/uL — ABNORMAL HIGH (ref 4.0–10.5)
nRBC: 0 % (ref 0.0–0.2)

## 2021-06-03 LAB — CMP (CANCER CENTER ONLY)
ALT: 14 U/L (ref 0–44)
AST: 15 U/L (ref 15–41)
Albumin: 4.4 g/dL (ref 3.5–5.0)
Alkaline Phosphatase: 48 U/L (ref 38–126)
Anion gap: 7 (ref 5–15)
BUN: 9 mg/dL (ref 8–23)
CO2: 33 mmol/L — ABNORMAL HIGH (ref 22–32)
Calcium: 9.8 mg/dL (ref 8.9–10.3)
Chloride: 98 mmol/L (ref 98–111)
Creatinine: 0.57 mg/dL (ref 0.44–1.00)
GFR, Estimated: >60 mL/min (ref >60)
Glucose, Bld: 81 mg/dL (ref 70–99)
Potassium: 3.8 mmol/L (ref 3.5–5.1)
Sodium: 138 mmol/L (ref 135–145)
Total Bilirubin: 0.5 mg/dL (ref 0.3–1.2)
Total Protein: 7.6 g/dL (ref 6.5–8.1)

## 2021-06-03 LAB — GENETIC SCREENING ORDER

## 2021-06-03 NOTE — Progress Notes (Addendum)
[place] NOTE  Patient Care Team: [name], MD as PCP - General (Internal Medicine) Sueanne Margarita, MD as PCP - Cardiology (Cardiology) Mauro Kaufmann, RN as Oncology Nurse Navigator Rockwell Germany, RN as Oncology Nurse Navigator Jovita Kussmaul, MD as Consulting Physician (General Surgery) Nicholas Lose, MD as Consulting Physician (Hematology and Oncology) Gery Pray, MD as Consulting Physician (Radiation Oncology)  CHIEF COMPLAINTS/PURPOSE OF CONSULTATION:  Newly diagnosed breast cancer  HISTORY OF PRESENTING ILLNESS:  Caroline Greer 66 y.o. female is here because of recent diagnosis of right breast cancer.  Patient felt a palpable lump in the right breast measuring 1.1 cm on mammogram and by ultrasound made measures 1.2 cm.  Axilla was negative.  Biopsy of this mass revealed grade 2 invasive lobular cancer ER 90%, PR 100%, Ki-67 5%, HER2 negative.  She was presented this morning to the multidisciplinary tumor board and she is here today to discuss her treatment plan.  I reviewed her records extensively and collaborated the history with the patient.  SUMMARY OF ONCOLOGIC HISTORY: Oncology History  Malignant neoplasm of upper-outer quadrant of right breast in female, estrogen receptor positive ([place])  05/20/2021 Initial Diagnosis   Palpable mass in the right breast upper outer quadrant: 1.1 cm along with distortion.  Mammogram ultrasound revealed 1.2 cm mass at 11 o'clock position, axilla negative, biopsy revealed grade 2 invasive lobular cancer ER 90%, PR 100%, HER2 negative, Ki-67 5%   06/03/2021 Cancer Staging   Staging form: Breast, AJCC 8th Edition - Clinical stage from 06/03/2021: Stage IA (cT1b, cN0, cM0, G2, ER+, PR+, HER2-) - Signed by Nicholas Lose, MD on 06/03/2021 Stage prefix: Initial diagnosis Histologic grading system: 3 grade system       MEDICAL HISTORY:  Past Medical History:  Diagnosis Date   Acute respiratory failure with  hypoxia ([place]) 09/30/2018   Constipation    COPD exacerbation ([place]) 01/05/2019   Depression with anxiety 09/30/2018   Essential hypertension 09/30/2018   Hypertension    Tobacco dependence due to cigarettes 09/30/2018    SURGICAL HISTORY: Past Surgical History:  Procedure Laterality Date   LAPAROTOMY  1974   RIGHT/LEFT HEART CATH AND CORONARY ANGIOGRAPHY N/A 10/02/2019   Procedure: RIGHT/LEFT HEART CATH AND CORONARY ANGIOGRAPHY;  Surgeon: Sherren Mocha, MD;  Location: [facility];  Service: Cardiovascular;  Laterality: N/A;    SOCIAL HISTORY: Social History   Socioeconomic History   Marital status: Single    Spouse name: Not on file   Number of children: Not on file   Years of education: Not on file   Highest education level: Not on file  Occupational History   Not on file  Tobacco Use   Smoking status: Every Day    Packs/day: 0.80    Years: 43.00    Pack years: 34.40    Types: Cigarettes   Smokeless tobacco: Never  Substance and Sexual Activity   Alcohol use: Yes    Comment: gets 6 beers on a weekend and drinks them   Drug use: Not Currently    Types: Marijuana    Comment: tried to smoke some about a month ago   Sexual activity: Not on file  Other Topics Concern   Not on file  Social History Narrative   Not on file   Social Determinants of Health   Financial Resource Strain: Not on file  Food Insecurity: Not on file  Transportation Needs: Not on file  Physical Activity: Not on file  Stress: Not on file  Social Connections: Not on file  Intimate Partner Violence: Not on file    FAMILY HISTORY: Family History  Problem Relation Age of Onset   COPD Mother     ALLERGIES:  is allergic to crab [shellfish allergy] and erythromycin.  MEDICATIONS:  Current Outpatient Medications  Medication Sig Dispense Refill   ALPRAZolam (XANAX) 0.5 MG tablet Take 0.5 mg by mouth at bedtime as needed for sleep. (Patient not taking: Reported on 06/03/2021)     amLODipine  (NORVASC) 5 MG tablet Take 1 tablet (5 mg total) by mouth daily. Pt needs to contact our office to schedule appt for further refills - 2nd attempt 30 tablet 0   aspirin EC 81 MG tablet Take 81 mg by mouth daily.     atorvastatin (LIPITOR) 10 MG tablet Take 1 tablet (10 mg total) by mouth daily. Pt needs to make appt with provider for further refills - 1st attempt 30 tablet 0   benazepril (LOTENSIN) 40 MG tablet Take 40 mg by mouth daily.     Budeson-Glycopyrrol-Formoterol (BREZTRI AEROSPHERE) 160-9-4.8 MCG/ACT AERO Inhale 2 puffs into the lungs in the morning and at bedtime. (Patient not taking: Reported on 06/03/2021) 32.1 g 1   ipratropium-albuterol (DUONEB) 0.5-2.5 (3) MG/3ML SOLN Take 3 mLs by nebulization 4 (four) times daily as needed (shortness of breath).  (Patient not taking: Reported on 06/03/2021)     loratadine (CLARITIN) 10 MG tablet Take 10 mg by mouth daily as needed for allergies.     metoprolol tartrate (LOPRESSOR) 25 MG tablet Take 1 tablet (25 mg total) by mouth 2 (two) times daily. Please call to make an appointment with Dr. Radford Pax for additional refills [phone]. Thank you. 1st attempt 60 tablet 0   Multiple Vitamins-Minerals (CENTRUM SILVER 50+WOMEN) TABS Take 1 tablet by mouth daily.     Omega-3 Fatty Acids (FISH OIL) 1000 MG CAPS Take 1,000 mg by mouth daily.     polyethylene glycol (MIRALAX / GLYCOLAX) 17 g packet Take 17 g by mouth daily. (Patient not taking: Reported on 06/03/2021) 14 each 0   PROAIR HFA 108 (90 Base) MCG/ACT inhaler Inhale 2 puffs into the lungs every 6 (six) hours as needed for wheezing or shortness of breath. 90 day supply (Patient not taking: Reported on 06/03/2021) 18 g 3   vitamin B-12 (CYANOCOBALAMIN) 500 MCG tablet Take 500 mcg by mouth daily.     No current facility-administered medications for this visit.    REVIEW OF SYSTEMS:   Constitutional: Denies fevers, chills or abnormal night sweats Eyes: Denies blurriness of vision, double vision or  watery eyes Ears, nose, mouth, throat, and face: Denies mucositis or sore throat Respiratory: Denies cough, dyspnea or wheezes Cardiovascular: Denies palpitation, chest discomfort or lower extremity swelling Gastrointestinal:  Denies nausea, heartburn or change in bowel habits Skin: Denies abnormal skin rashes Lymphatics: Denies new lymphadenopathy or easy bruising Neurological:Denies numbness, tingling or new weaknesses Behavioral/Psych: Mood is stable, no new changes  Breast: Palpable right breast lump All other systems were reviewed with the patient and are negative.  PHYSICAL EXAMINATION: ECOG PERFORMANCE STATUS: 0 - Asymptomatic  Vitals:   06/03/21 1304  BP: (!) 173/90  Pulse: 83  Resp: 20  Temp: 98.1 F (36.7 C)  SpO2: (!) 79%   Filed Weights   06/03/21 1304  Weight: 130 lb 6.4 oz (59.1 kg)       LABORATORY DATA:  I have reviewed the data as listed Lab Results  Component Value Date   WBC 11.2 (H) 06/03/2021   HGB 15.6 (H) 06/03/2021   HCT 47.0 (H) 06/03/2021   MCV 97.9 06/03/2021   PLT 247 06/03/2021   Lab Results  Component Value Date   NA 138 06/03/2021   K 3.8 06/03/2021   CL 98 06/03/2021   CO2 33 (H) 06/03/2021    RADIOGRAPHIC STUDIES: I have personally reviewed the radiological reports and agreed with the findings in the report.  ASSESSMENT AND PLAN:  Malignant neoplasm of upper-outer quadrant of right breast in female, estrogen receptor positive ([place]) 05/20/2021:Palpable mass in the right breast upper outer quadrant: 1.1 cm along with distortion.  Mammogram ultrasound revealed 1.2 cm mass at 11 o'clock position, axilla negative, biopsy revealed grade 2 invasive lobular cancer ER 90%, PR 100%, HER2 negative, Ki-67 5%  Pathology and radiology counseling:Discussed with the patient, the details of pathology including the type of breast cancer,the clinical staging, the significance of ER, PR and HER-2/neu receptors and the implications for treatment.  After reviewing the pathology in detail, we proceeded to discuss the different treatment options between surgery, radiation, chemotherapy, antiestrogen therapies.  Recommendations: Because it is lobular histology we will obtain a breast MRI. 1. Breast conserving surgery followed by 2. Adjuvant radiation therapy followed by 3. Adjuvant antiestrogen therapy We decided not to do Oncotype DX testing because of her severe COPD and performance status issues using a 24-hour home O2.  Return to clinic after surgery to discuss final pathology report   All questions were answered. The patient knows to call the clinic with any problems, questions or concerns.    Harriette Ohara, MD 06/03/21

## 2021-06-03 NOTE — Therapy (Signed)
OUTPATIENT PHYSICAL THERAPY BREAST CANCER BASELINE EVALUATION   Patient Name: Caroline Greer MRN: [id number] DOB:June 29, 1955, 66 y.o., female Today's Date: 06/03/2021   PT End of Session - 06/03/21 1706     Visit Number 1    Number of Visits 2    Date for PT Re-Evaluation 07/29/21    PT Start Time 9024    PT Stop Time 1457    PT Time Calculation (min) 24 min    Activity Tolerance Patient tolerated treatment well    Behavior During Therapy [facility] for tasks assessed/performed             Past Medical History:  Diagnosis Date   Acute respiratory failure with hypoxia ([place]) 09/30/2018   Constipation    COPD exacerbation ([place]) 01/05/2019   Depression with anxiety 09/30/2018   Essential hypertension 09/30/2018   Hypertension    Tobacco dependence due to cigarettes 09/30/2018   Past Surgical History:  Procedure Laterality Date   LAPAROTOMY  1974   RIGHT/LEFT HEART CATH AND CORONARY ANGIOGRAPHY N/A 10/02/2019   Procedure: RIGHT/LEFT HEART CATH AND CORONARY ANGIOGRAPHY;  Surgeon: Sherren Mocha, MD;  Location: [facility];  Service: Cardiovascular;  Laterality: N/A;   Patient Active Problem List   Diagnosis Date Noted   Genetic testing 06/03/2021   Malignant neoplasm of upper-outer quadrant of right breast in female, estrogen receptor positive ([place]) 06/01/2021   Abnormal cardiac CT angiography 10/02/2019   Constipation    COPD (chronic obstructive pulmonary disease) ([place]) 01/05/2019   Encounter for screening for HIV 01/05/2019   COPD exacerbation ([place]) 01/05/2019   Acute respiratory failure with hypoxia ([place]) 09/30/2018   Essential hypertension 09/30/2018   Tobacco dependence due to cigarettes 09/30/2018   Depression with anxiety 09/30/2018    PCP: [name], MD  REFERRING PROVIDER: Jovita Kussmaul, MD  REFERRING DIAG: Right breast cancer  THERAPY DIAG:  Malignant neoplasm of upper-outer quadrant of right breast in female, estrogen receptor positive ([place])  Abnormal  posture  ONSET DATE: 05/04/2021  SUBJECTIVE                                                                                                                                                                                           SUBJECTIVE STATEMENT: Patient reports she is here today to be seen by her medical team for her newly diagnosed right breast cancer.   PERTINENT HISTORY:  Patient was diagnosed on 05/04/2021 with right grade II invasive lobular carcinoma breast cancer. It measures 1.2 cm cm and is located in the upper outer quadrant. It is ER/PR positive and HER2 negative with a Ki67 of 5%.  She is on 3L of oxygen and has COPD.  PATIENT GOALS   reduce lymphedema risk and learn post op HEP.   PAIN:  Are you having pain? No  PRECAUTIONS: Active CA; on O2  WEIGHT BEARING RESTRICTIONS No  FALLS:  Has patient fallen in last 6 months? Yes, Number of falls: 1 - she fell in her bedroom trying to kill a moth but denies any balance deficits.  LIVING ENVIRONMENT: Patient lives with: alone Lives in: House/apartment Has following equipment at home: None  OCCUPATION: retired  LEISURE: She does some chair exercises  PRIOR LEVEL OF FUNCTION: Independent   OBJECTIVE  COGNITION:  Overall cognitive status: Within functional limits for tasks assessed    POSTURE:  Forward head and rounded shoulders posture  UPPER EXTREMITY AROM/PROM:  A/PROM Right 06/03/2021 Left 06/03/2021  Shoulder extension 35 55  Shoulder flexion 145 142  Shoulder abduction 152 166  Shoulder internal rotation 72 69  Shoulder external rotation 80 81    (Blank rows = not tested)    CERVICAL AROM: All within normal limits  UPPER EXTREMITY STRENGTH: WFL   LYMPHEDEMA ASSESSMENTS:   LANDMARK RIGHT 06/03/2021 LEFT 06/03/2021  10 cm proximal to olecranon process 22 21.5  Olecranon process 22.4 21.4  10 cm proximal to ulnar styloid process 18.7 17.9  Just proximal to ulnar styloid process 14.7 14.5   Across hand at thumb web space 18.5 18.2  At base of 2nd digit 5.9 6.9  (Blank rows = not tested)   L-DEX LYMPHEDEMA SCREENING:  Unable to test as pt was connected to oxygen in the wall of her exam room and appeared to be very short of breath. PT did not want to make her walk down the hall for testing. QUICK DASH SURVEY:  Pt did not complete questionnaire  PATIENT EDUCATION:  Education details: Lymphedema risk reduction and post op shoulder/posture HEP Person educated: Patient Education method: Explanation, Demonstration, Handout Education comprehension: Patient verbalized understanding and returned demonstration   HOME EXERCISE PROGRAM: Patient was instructed today in a home exercise program today for post op shoulder range of motion. These included active assist shoulder flexion in sitting, scapular retraction, wall walking with shoulder abduction, and hands behind head external rotation.  She was encouraged to do these twice a day, holding 3 seconds and repeating 5 times when permitted by her physician.   ASSESSMENT:  CLINICAL IMPRESSION: Patient was diagnosed on 05/04/2021 with right grade II invasive lobular carcinoma breast cancer. It measures 1.2 cm cm and is located in the upper outer quadrant. It is ER/PR positive and HER2 negative with a Ki67 of 5%. She is on 3L of oxygen and has COPD.Her multidisciplinary medical team met prior to her assessments to determine a recommended treatment plan. She is planning to have a right lumpectomy and sentinel node biopsy followed by radiation and anti-estrogen therapy. She will benefit from a post op PT reassessment to determine needs and from L-Dex screens every 3 months for 2 years to detect subclinical lymphedema.  Pt will benefit from skilled therapeutic intervention to improve on the following deficits: Decreased knowledge of precautions, impaired UE functional use, pain, decreased ROM, postural dysfunction.   PT  treatment/interventions: ADL/self-care home management, pt/family education, therapeutic exercise  REHAB POTENTIAL: Good  CLINICAL DECISION MAKING: Stable/uncomplicated  EVALUATION COMPLEXITY: Low   GOALS: Goals reviewed with patient? YES  LONG TERM GOALS: (STG=LTG)   Name Target Date Goal status  1 Pt will be able to verbalize understanding of pertinent  lymphedema risk reduction practices relevant to her dx specifically related to skin care.  Baseline:  No knowledge 06/03/2021 Achieved at eval  2 Pt will be able to return demo and/or verbalize understanding of the post op HEP related to regaining shoulder ROM. Baseline:  No knowledge 06/03/2021 Achieved at eval  3 Pt will be able to verbalize understanding of the importance of attending the post op After Breast CA Class for further lymphedema risk reduction education and therapeutic exercise.  Baseline:  No knowledge 06/03/2021 Achieved at eval  4 Pt will demo she has regained full shoulder ROM and function post operatively compared to baselines.  Baseline: See objective measurements taken today. 12/24/2021      PLAN: PT FREQUENCY/DURATION: EVAL and 1 follow up appointment.   PLAN FOR NEXT SESSION: will reassess 3-4 weeks post op to determine needs.   Patient will follow up at outpatient cancer rehab 3-4 weeks following surgery.  If the patient requires physical therapy at that time, a specific plan will be dictated and sent to the referring physician for approval. The patient was educated today on appropriate basic range of motion exercises to begin post operatively and the importance of attending the After Breast Cancer class following surgery.  Patient was educated today on lymphedema risk reduction practices as it pertains to recommendations that will benefit the patient immediately following surgery.  She verbalized good understanding.    Physical Therapy Information for After Breast Cancer Surgery/Treatment:  Lymphedema is a  swelling condition that you may be at risk for in your arm if you have lymph nodes removed from the armpit area.  After a sentinel node biopsy, the risk is approximately 5-9% and is higher after an axillary node dissection.  There is treatment available for this condition and it is not life-threatening.  Contact your physician or physical therapist with concerns. You may begin the 4 shoulder/posture exercises (see additional sheet) when permitted by your physician (typically a week after surgery).  If you have drains, you may need to wait until those are removed before beginning range of motion exercises.  A general recommendation is to not lift your arms above shoulder height until drains are removed.  These exercises should be done to your tolerance and gently.  This is not a "no pain/no gain" type of recovery so listen to your body and stretch into the range of motion that you can tolerate, stopping if you have pain.  If you are having immediate reconstruction, ask your plastic surgeon about doing exercises as he or she may want you to wait. We encourage you to attend the free one time ABC (After Breast Cancer) class offered by [facility].  You will learn information related to lymphedema risk, prevention and treatment and additional exercises to regain mobility following surgery.  You can call [phone] for more information.  This is offered the 1st and 3rd Monday of each month.  You only attend the class one time. While undergoing any medical procedure or treatment, try to avoid blood pressure being taken or needle sticks from occurring on the arm on the side of cancer.   This recommendation begins after surgery and continues for the rest of your life.  This may help reduce your risk of getting lymphedema (swelling in your arm). An excellent resource for those seeking information on lymphedema is the National Lymphedema Network's web site. It can be accessed at  [facility] If you notice swelling in your hand, arm or breast  at any time following surgery (even if it is many years from now), please contact your doctor or physical therapist to discuss this.  Lymphedema can be treated at any time but it is easier for you if it is treated early on.  If you feel like your shoulder motion is not returning to normal in a reasonable amount of time, please contact your surgeon or physical therapist.  [facility] [address] [place], [place], [place] [phone]; [address], [place], [place] [address] ABC CLASS After Breast Cancer Class  After Breast Cancer Class is a specially designed exercise class to assist you in a safe recover after having breast cancer surgery.  In this class you will learn how to get back to full function whether your drains were just removed or if you had surgery a month ago.  This one-time class is held the 1st and 3rd Monday of every month from 11:00 a.m. until 12:00 noon at the [facility] located at [place], [place] [address]  This class is FREE and space is limited. For more information or to register for the next available class, call [phone].  Class Goals  Understand specific stretches to improve the flexibility of you chest and shoulder. Learn ways to safely strengthen your upper body and improve your posture. Understand the warning signs of infection and why you may be at risk for an arm infection. Learn about Lymphedema and prevention.  ** You do not attend this class until after surgery.  Drains must be removed to participate  Patient was instructed today in a home exercise program today for post op shoulder range of motion. These included active assist shoulder flexion in sitting, scapular retraction, wall walking with shoulder abduction, and hands behind head external rotation.  She was encouraged to do these twice a day, holding 3 seconds and repeating 5 times when permitted by her  physician.  Annia Friendly, [place] 06/03/21 5:13 PM

## 2021-06-03 NOTE — Assessment & Plan Note (Signed)
05/20/2021:Palpable mass in the right breast upper outer quadrant: 1.1 cm along with distortion.  Mammogram ultrasound revealed 1.2 cm mass at 11 o'clock position, axilla negative, biopsy revealed grade 2 invasive lobular cancer ER 90%, PR 100%, HER2 negative, Ki-67 5%  Pathology and radiology counseling:Discussed with the patient, the details of pathology including the type of breast cancer,the clinical staging, the significance of ER, PR and HER-2/neu receptors and the implications for treatment. After reviewing the pathology in detail, we proceeded to discuss the different treatment options between surgery, radiation, chemotherapy, antiestrogen therapies.  Recommendations: Because it is lobular histology we will obtain a breast MRI. 1. Breast conserving surgery followed by 2. Oncotype DX testing to determine if chemotherapy would be of any benefit followed by 3. Adjuvant radiation therapy followed by 4. Adjuvant antiestrogen therapy  Oncotype counseling: I discussed Oncotype DX test. I explained to the patient that this is a 21 gene panel to evaluate patient tumors DNA to calculate recurrence score. This would help determine whether patient has high risk or low risk breast cancer. She understands that if her tumor was found to be high risk, she would benefit from systemic chemotherapy. If low risk, no need of chemotherapy.  Return to clinic after surgery to discuss final pathology report and then determine if Oncotype DX testing will need to be sent.

## 2021-06-04 ENCOUNTER — Telehealth: Payer: Self-pay | Admitting: *Deleted

## 2021-06-04 NOTE — Telephone Encounter (Signed)
° °  Pre-operative Risk Assessment    Patient Name: Caroline Greer  DOB: [birth year] MRN: [id number]      Request for Surgical Clearance    Procedure:   BREAST LUMPECTOMY  Date of Surgery:  Clearance TBD                                 Surgeon:  DR. Autumn Messing III Surgeon's Group or Practice Name:  [facility] Phone number:  [phone] Fax number:  [phone] ATTN: [name], CMA   Type of Clearance Requested:   - Medical  - Pharmacy:  Hold Aspirin     Type of Anesthesia:  General    Additional requests/questions:    Caroline Greer   06/04/2021, 10:25 AM

## 2021-06-04 NOTE — Telephone Encounter (Signed)
I s/w the pt today and informed her that she will need an appt for pre op clearance. Pt is agreeable. Pt normally follow Dr. Radford Pax at [facility] location, though no openings. Pt agreeable to be seen at [facility] location 06/12/21 @ 1:55 with Laurann Montana, NP. I will forward notes to NP for upcoming appt. Will send FYI to surgeon's office pt has appt 06/12/21.

## 2021-06-04 NOTE — Telephone Encounter (Signed)
Primary Cardiologist:Traci Turner, MD  Chart reviewed as part of pre-operative protocol coverage. Because of Caroline Greer past medical history and time since last visit, he/she will require a follow-up visit in order to better assess preoperative cardiovascular risk.  Pre-op covering staff: - Please schedule appointment and call patient to inform them. - Please contact requesting surgeon's office via preferred method (i.e, phone, fax) to inform them of need for appointment prior to surgery.  If applicable, this message will also be routed to pharmacy pool and/or primary cardiologist for input on holding anticoagulant/antiplatelet agent as requested below so that this information is available at time of patient's appointment.   Caroline Life, NP-C    06/04/2021, 2:55 PM [place] [address], [address] Office [phone] Fax [phone]

## 2021-06-06 ENCOUNTER — Other Ambulatory Visit: Payer: Self-pay | Admitting: Cardiology

## 2021-06-07 ENCOUNTER — Encounter: Payer: Self-pay | Admitting: Cardiology

## 2021-06-11 ENCOUNTER — Encounter: Payer: Self-pay | Admitting: *Deleted

## 2021-06-11 ENCOUNTER — Telehealth: Payer: Self-pay | Admitting: *Deleted

## 2021-06-11 NOTE — Telephone Encounter (Signed)
Spoke with patient to follow up from [facility] 1/11 and assess navigation needs. Patient needs pulmonary clearance prior to surgery and she is scheduled to see her doctor 1/20 for that.  She is ready to move forward as soon as she can get clearance for surgery.  Encouraged her to call should she have any questions or concerns. Patient verbalized understanding.

## 2021-06-12 ENCOUNTER — Other Ambulatory Visit: Payer: Self-pay

## 2021-06-12 ENCOUNTER — Encounter (HOSPITAL_BASED_OUTPATIENT_CLINIC_OR_DEPARTMENT_OTHER): Payer: Self-pay | Admitting: Family

## 2021-06-12 ENCOUNTER — Ambulatory Visit (HOSPITAL_BASED_OUTPATIENT_CLINIC_OR_DEPARTMENT_OTHER): Payer: Medicare Other | Admitting: Family

## 2021-06-12 VITALS — BP 112/52 | HR 83 | Ht 70.0 in | Wt 130.0 lb

## 2021-06-12 DIAGNOSIS — E785 Hyperlipidemia, unspecified: Secondary | ICD-10-CM | POA: Diagnosis not present

## 2021-06-12 DIAGNOSIS — J449 Chronic obstructive pulmonary disease, unspecified: Secondary | ICD-10-CM

## 2021-06-12 DIAGNOSIS — I272 Pulmonary hypertension, unspecified: Secondary | ICD-10-CM

## 2021-06-12 DIAGNOSIS — I10 Essential (primary) hypertension: Secondary | ICD-10-CM

## 2021-06-12 DIAGNOSIS — Z720 Tobacco use: Secondary | ICD-10-CM | POA: Diagnosis not present

## 2021-06-12 DIAGNOSIS — I25118 Atherosclerotic heart disease of native coronary artery with other forms of angina pectoris: Secondary | ICD-10-CM

## 2021-06-12 MED ORDER — AMLODIPINE BESYLATE 5 MG PO TABS: 5.0000 mg | ORAL_TABLET | Freq: Every day | ORAL | 3 refills | Status: DC

## 2021-06-12 MED ORDER — METOPROLOL TARTRATE 25 MG PO TABS: 25.0000 mg | ORAL_TABLET | Freq: Two times a day (BID) | ORAL | 3 refills | Status: DC

## 2021-06-12 NOTE — Patient Instructions (Addendum)
Medication Instructions:  Continue your current medications.   *If you need a refill on your cardiac medications before your next appointment, please call your pharmacy*   Lab Work: Your physician recommends that you return for lab work at your convenience for fasting lipid panel and CMET.   Please return for Lab work. You may come to the...   [facility] (3rd floor) [address] [place], [place] [address]  Open: 8am-Noon and 1pm-4:30pm   [place] [facility]- Any location  **no appointments needed**   If you have labs (blood work) drawn today and your tests are completely normal, you will receive your results only by: [employer] (if you have MyChart) OR A paper copy in the mail If you have any lab test that is abnormal or we need to change your treatment, we will call you to review the results.   Testing/Procedures: Your EKG showed normal sinus rhythm.    Follow-Up: At [facility], you and your health needs are our priority.  As part of our continuing mission to provide you with exceptional heart care, we have created designated Provider Care Teams.  These Care Teams include your primary Cardiologist (physician) and Advanced Practice Providers (APPs -  Physician Assistants and Nurse Practitioners) who all work together to provide you with the care you need, when you need it.  We recommend signing up for the patient portal called "MyChart".  Sign up information is provided on this After Visit Summary.  MyChart is used to connect with patients for Virtual Visits (Telemedicine).  Patients are able to view lab/test results, encounter notes, upcoming appointments, etc.  Non-urgent messages can be sent to your provider as well.   To learn more about what you can do with MyChart, go to [email or web address].    Your next appointment:   6 month(s)  The format for your next appointment:   In  Person  Provider:   [name], MD or Advanced Practice Provider    Other Instructions  Loel Dubonnet, NP will send  a note to Dr. Ethlyn Gallery office that you are cleared for your procedure.   Heart Healthy Diet Recommendations: A low-salt diet is recommended. Meats should be grilled, baked, or boiled. Avoid fried foods. Focus on lean protein sources like fish or chicken with vegetables and fruits. The American Heart Association is a Microbiologist!  American Heart Association Diet and Lifeystyle Recommendations    Exercise recommendations: The American Heart Association recommends 150 minutes of moderate intensity exercise weekly. Try 30 minutes of moderate intensity exercise 4-5 times per week. This could include walking, jogging, or swimming.

## 2021-06-12 NOTE — Progress Notes (Signed)
Office Visit    Patient Name: Caroline Greer Date of Encounter: 06/12/2021  PCP:  [name], [facility]  Cardiologist:  [name], MD  Advanced Practice Provider:  No care team member to display Electrophysiologist:  None      Chief Complaint    Caroline Greer is a 66 y.o. female with a hx of COPD, HTN, CAD presents today for preoperative clearance   Past Medical History    Past Medical History:  Diagnosis Date   Acute respiratory failure with hypoxia ([place]) 09/30/2018   Constipation    COPD exacerbation ([place]) 01/05/2019   Depression with anxiety 09/30/2018   Essential hypertension 09/30/2018   Hypertension    Tobacco dependence due to cigarettes 09/30/2018   Past Surgical History:  Procedure Laterality Date   LAPAROTOMY  1974   RIGHT/LEFT HEART CATH AND CORONARY ANGIOGRAPHY N/A 10/02/2019   Procedure: RIGHT/LEFT HEART CATH AND CORONARY ANGIOGRAPHY;  Surgeon: Sherren Mocha, MD;  Location: [facility];  Service: Cardiovascular;  Laterality: N/A;    Allergies  Allergies  Allergen Reactions   Crab [Shellfish Allergy] Swelling   Erythromycin Other (See Comments)    convulsion    History of Present Illness    Caroline Greer is a 66 y.o. female with a hx of COPD, HTN, CAD last seen 04/2020 by Dr. Radford Pax.  She had lug cancer screening which had inadvertent finding of coronary calcification. Cardiac CTA 08/2019 with moderate to severe multivessel CAD. Cardiac cath 08/2019 50% mRCA, 50% OM1, 50% prox to mid LCx, 30% prox to mid LAD, 50% D1 with moderate PHTN felt to be related to COPD. Recommended for aggressive medical therapy.   She was last seen 05/01/20 doing well from cardiac perspective. Echo 10/2020 LVEF 65-70%, no RWMA, mild LVH, gr2DD, mildly elevated PASP, LA mild to moderately dilated, trivial MR, mild aortic valve sclerosis without stenosis, borderline dilation of ascending aorta 36mm  She presents today for preoperative clearance  for breast lumpectomy with Dr. Marlou Starks. Present today with her daughter. Reports no shortness of breath at rest though notes longstanding dyspnea on exertion. Does not feel her Judithann Sauger is working as well. Does experience relief with Albuterol PRN. Notes occasional wheeze. No fever. Reports no chest pain, pressure, or tightness. No edema, orthopnea, PND. Reports no palpitations.  She is active doing housework.   EKGs/Labs/Other Studies Reviewed:   The following studies were reviewed today:  Echo 10/2020  1. Left ventricular ejection fraction, by estimation, is 65 to 70%. The  left ventricle has normal function. The left ventricle has no regional  wall motion abnormalities. There is mild concentric left ventricular  hypertrophy. Left ventricular diastolic  parameters are consistent with Grade II diastolic dysfunction  (pseudonormalization).   2. Right ventricular systolic function is normal. The right ventricular  size is normal. There is mildly elevated pulmonary artery systolic  pressure. The estimated right ventricular systolic pressure is 00.4 mmHg.   3. Left atrial size was mild to moderately dilated.   4. The mitral valve is abnormal. Trivial mitral valve regurgitation. No  evidence of mitral stenosis.   5. The aortic valve has an indeterminant number of cusps. There is mild  calcification of the aortic valve. Aortic valve regurgitation is not  visualized. Mild aortic valve sclerosis is present, with no evidence of  aortic valve stenosis.   6. Aortic dilatation noted. There is borderline dilatation of the  ascending aorta, measuring 37 mm.   7.  The inferior vena cava is normal in size with greater than 50%  respiratory variability, suggesting right atrial pressure of 3 mmHg.    [facility] 09/2019 Mid RCA lesion is 50% stenosed. 1st Mrg lesion is 50% stenosed. Prox Cx to Mid Cx lesion is 50% stenosed. Prox LAD to Mid LAD lesion is 30% stenosed. 1st Diag lesion is 50% stenosed. LV end  diastolic pressure is normal.   1.  Mild to moderate multivessel nonobstructive coronary artery disease as outlined with 50% stenosis of the mid RCA, 30 to 40% stenosis of the proximal to mid LAD, 50% diagonal ostial stenosis, and 40 to 50% stenosis of the left circumflex/first OM bifurcation 2.  Normal LVEDP 3.  Moderate pulmonary hypertension with PVR of 5.1 Wood units, preserved cardiac output and normal RA pressure (likely secondary to chronic lung disease)   Recommend: medical therapy  2D echo 07/2019 IMPRESSIONS   1. Left ventricular ejection fraction, by estimation, is 65 to 70%. The  left ventricle has normal function. The left ventricle has no regional  wall motion abnormalities. There is mild left ventricular hypertrophy.  Left ventricular diastolic parameters  are consistent with Grade I diastolic dysfunction (impaired relaxation).   2. Right ventricular systolic function is normal. The right ventricular  size is normal.   3. The mitral valve is abnormal. Trivial mitral valve regurgitation.   4. The aortic valve is tricuspid. Aortic valve regurgitation is not  visualized. Mild to moderate aortic valve sclerosis/calcification is  present, without any evidence of aortic stenosis.   5. The inferior vena cava is normal in size with greater than 50%  respiratory variability, suggesting right atrial pressure of 3 mmHg.     EKG:  EKG is  ordered today.  The ekg ordered today demonstrates NSR 83 bpm with occasional PAC. Righward axis and incomplete RBBB. No acute ST/T wave changes.   Recent Labs: 06/03/2021: ALT 14; BUN 9; Creatinine 0.57; Hemoglobin 15.6; Platelet Count 247; Potassium 3.8; Sodium 138  Recent Lipid Panel    Component Value Date/Time   CHOL 141 05/13/2020 1436   TRIG 92 05/13/2020 1436   HDL 84 05/13/2020 1436   CHOLHDL 1.7 05/13/2020 1436   LDLCALC 40 05/13/2020 1436     Home Medications   Current Meds  Medication Sig   ALPRAZolam (XANAX) 0.5 MG tablet  Take 0.5 mg by mouth at bedtime as needed for sleep.   amLODipine (NORVASC) 5 MG tablet Take 1 tablet (5 mg total) by mouth daily. Pt needs to contact our office to schedule appt for further refills - 2nd attempt   aspirin EC 81 MG tablet Take 81 mg by mouth daily.   atorvastatin (LIPITOR) 10 MG tablet Take 1 tablet (10 mg total) by mouth daily. Pt needs to make appt with provider for further refills - 1st attempt   benazepril (LOTENSIN) 40 MG tablet Take 40 mg by mouth daily.   Budeson-Glycopyrrol-Formoterol (BREZTRI AEROSPHERE) 160-9-4.8 MCG/ACT AERO Inhale 2 puffs into the lungs in the morning and at bedtime.   ipratropium-albuterol (DUONEB) 0.5-2.5 (3) MG/3ML SOLN Take 3 mLs by nebulization 4 (four) times daily as needed (shortness of breath).   loratadine (CLARITIN) 10 MG tablet Take 10 mg by mouth daily as needed for allergies.   metoprolol tartrate (LOPRESSOR) 25 MG tablet Take 1 tablet (25 mg total) by mouth 2 (two) times daily. Please keep upcoming appt in January 2023 with Cardiologist before anymore refills. Thank you Final Attempt   Multiple Vitamins-Minerals (CENTRUM SILVER  50+WOMEN) TABS Take 1 tablet by mouth daily.   Omega-3 Fatty Acids (FISH OIL) 1000 MG CAPS Take 1,000 mg by mouth daily.   polyethylene glycol (MIRALAX / GLYCOLAX) 17 g packet Take 17 g by mouth daily.   PROAIR HFA 108 (90 Base) MCG/ACT inhaler Inhale 2 puffs into the lungs every 6 (six) hours as needed for wheezing or shortness of breath. 90 day supply   vitamin B-12 (CYANOCOBALAMIN) 500 MCG tablet Take 500 mcg by mouth daily.     Review of Systems      All other systems reviewed and are otherwise negative except as noted above.  Physical Exam    VS:  BP (!) 112/52    Pulse 83    Ht 5\' 10"  (1.778 m)    Wt 130 lb (59 kg)    BMI 18.65 kg/m  , BMI Body mass index is 18.65 kg/m.  Wt Readings from Last 3 Encounters:  06/12/21 130 lb (59 kg)  06/03/21 130 lb 6.4 oz (59.1 kg)  05/01/20 138 lb (62.6 kg)      GEN: Well nourished, well developed, in no acute distress. HEENT: normal. Neck: Supple, no JVD, carotid bruits, or masses. Cardiac: RRR, no murmurs, rubs, or gallops. No clubbing, cyanosis, edema.  Radials/PT 2+ and equal bilaterally.  Respiratory:  Respirations regular and unlabored, clear to auscultation bilaterally. GI: Soft, nontender, nondistended. MS: No deformity or atrophy. Skin: Warm and dry, no rash. Neuro:  Strength and sensation are intact. Psych: Normal affect.  Assessment & Plan    Preop clearance - Upcoming lumpectomy. According to the Revised Cardiac Risk Index (RCRI), her Perioperative Risk of Major Cardiac Event is (%): 0.9. Her  Functional Capacity in METs is: 4.73 according to the Duke Activity Status Index (DASI). She is acceptable risk for her planned procedure without additional cardiovascular testing. Given her coronary disease, would prefer she remain on aspirin throughout perioperative period but could be held if surgeon deems necessary.  CAD / Aortic atherosclerosis - Stable with no anginal symptoms. No indication for ischemic evaluation.  GDMT includes Aspirin, metoprolol, Atorvastatin. Heart healthy diet and regular cardiovascular exercise encouraged.    HTN - BP well controlled. Continue current antihypertensive regimen.  Refills provided.   Tobacco use / COPD / Pulmonary HTN - moderate PAH by cath 09/2019. Mild by echo 10/2020.  Recommend overdue follow up with pulmonology. Does not feel as if her inhaler is working as well. Given her exertional dyspnea is resolved by albuterol anticipate COPD and continued tobacco use as contributory. No edema, orthopnea suspicious for volume overload. Smoking cessation encouraged. Recommend utilization of 1800QUITNOW.   HLD, LDL goal <70 - 04/2020 LDL 40. Updated lipid panel to be collected at her convenience. Denies myalgias on Atorvastatin 10mg  QD.   Disposition: Follow up in 6 month(s) with [name], MD or  APP.  Signed, Loel Dubonnet, NP 06/12/2021, 1:42 PM [facility]

## 2021-06-15 ENCOUNTER — Telehealth: Payer: Self-pay | Admitting: Pulmonary Disease

## 2021-06-15 NOTE — Telephone Encounter (Signed)
Fax received from Dr. Autumn Messing with [facility] to perform a Breast Lumpectomy on patient.  Patient needs surgery clearance. Patient was seen on 07/18/2019 and is scheduled for OV on 07/02/21. Office protocol is a risk assessment can be sent to surgeon if patient has been seen in 60 days or less.   Sending to Dr. [name] for risk assessment

## 2021-06-18 ENCOUNTER — Encounter: Payer: Self-pay | Admitting: *Deleted

## 2021-06-26 ENCOUNTER — Other Ambulatory Visit: Payer: Self-pay | Admitting: Cardiology

## 2021-07-02 ENCOUNTER — Encounter: Payer: Self-pay | Admitting: Pulmonary Disease

## 2021-07-02 ENCOUNTER — Ambulatory Visit (INDEPENDENT_AMBULATORY_CARE_PROVIDER_SITE_OTHER): Payer: Medicare Other | Admitting: Pulmonary Disease

## 2021-07-02 ENCOUNTER — Other Ambulatory Visit: Payer: Self-pay

## 2021-07-02 VITALS — BP 160/82 | HR 93 | Temp 98.2°F | Ht 70.0 in | Wt 132.6 lb

## 2021-07-02 DIAGNOSIS — J449 Chronic obstructive pulmonary disease, unspecified: Secondary | ICD-10-CM | POA: Diagnosis not present

## 2021-07-02 DIAGNOSIS — R918 Other nonspecific abnormal finding of lung field: Secondary | ICD-10-CM

## 2021-07-02 DIAGNOSIS — J432 Centrilobular emphysema: Secondary | ICD-10-CM | POA: Diagnosis not present

## 2021-07-02 DIAGNOSIS — F17210 Nicotine dependence, cigarettes, uncomplicated: Secondary | ICD-10-CM | POA: Diagnosis not present

## 2021-07-02 NOTE — Patient Instructions (Addendum)
Thank you for visiting Dr. [name] at [facility]. Today we recommend the following:  Stay on breztri Congratulation on quitting smoking   Return in about 6 months (around 12/30/2021) for with APP or Dr. [name].    Please do your part to reduce the spread of COVID-19.

## 2021-07-02 NOTE — Progress Notes (Signed)
Synopsis: Referred in September 2020 for shortness of breath by [name], MD  Subjective:   PATIENT ID: Caroline Greer GENDER: female DOB: April 02, 1956, MRN: [id number]  Chief Complaint  Patient presents with   Follow-up    Patient with a past medical history of hypertension, problem list includes COPD.  No prior PFTs in epic.  Patient was admitted to the hospital on 01/05/2019 for COPD exacerbation.  Patient was discharged from the hospital on 01/07/2019.  Recommending outpatient pulmonary follow-up.  Admitted to the hospital on 814 with medical history of hypertension depression tobacco abuse and COPD worsening dyspnea over the past 3 days.  Started after she was mowing her grass.  Included symptoms of wheezing coughing no chest pain and no fevers.  COVID negative.  Was found to be hypoxemic oxygen saturations in the 90s on 6 L nasal cannula tachycardic.  EKG with right axis deviation no ST changes negative T waves in V1 through 4.  Patient was treated with bronchodilators systemic steroids and discharged on 3 L nasal cannula.  Patient was encouraged to use quit smoking.  Today, overall in office patient has been doing well since her hospitalization.  She has been slowly increasing her exercise tolerance.  She has been mowing her grass.  She currently lives alone.  She enjoys fishing.  She plans on going this weekend.  Unfortunately she is still smoking.  She is been trying to cut down but finds that it is very difficult.  She has been working with her insurance to help cover cost of inhalers however most inhalers have been very expensive.  She has been relying on receiving samples from her PCP.  We will also help her receive samples.  If needed we may be able to apply for financial assistance through manufacturer.  She does have daily dyspnea on exertion cough and shortness of breath.  She uses her nebulizer regularly at home.  Denies weight loss fevers or hemoptysis.  OV 03/07/2019: Patient here  today for COPD follow-up.  At last office visit patient was started on Trelegy inhaler.  Patient has been doing very well on Trelegy inhaler after start of the medication last office visit.  She needs refills of this medication today would like to have samples.  Also given refill of her albuterol inhaler.  Today we discussed her CT scan results which revealed multiple bilateral pulmonary nodules all of which were stable and benign in comparison to previous.  She does have evidence of emphysema.  Otherwise her dyspnea on exertion is doing well.  Delay in scheduling of her PFTs due to [place].  I think we can push this off until her repeat follow-up with [place] in 6 months.  Otherwise patient denies dyspnea on exertion nausea vomiting diarrhea chest pain.  No hemoptysis.  OV 07/18/2019: Patient seen today for follow-up of COPD.  Also has pulmonary nodules.  Patient last seen in the office by Derl Barrow, NP.  Was unable to get PFTs completed due to rescheduling due to Covid.  At the time had side effects from Trelegy.  This was stopped trialed off of inhaled steroids started on Stiolto.  Unfortunately at this time was still smoking.  Patient was also referred to lung cancer screening program and offered smoking cessation counseling.  Currently managed with anoro.  She feels like she was breathing much better on the Trelegy.  She comes today hypoxemic in the room sats in the 70s.  She did not bring her home oxygen with  her into the building.  We put her on oxygen here today she feels breathing much better at this time.  Of note she was able to quit smoking.  She did meet with pharmacy x2 since last office visit.  She does describe ice eating today and urged to eat ice.  She has not had a colonoscopy before.  She denies dark stools.  OV 07/02/2021: Here today for follow-up regarding COPD as well as clearance for surgery.  Last seen in the office in February 2021.  She was still smoking at the time.Patient was referred for  pulmonary clearance with plans for the lumpectomy by [facility] surgery.  Patient had mammogram in December followed by ultrasound-guided needle biopsy confirming mammary carcinoma of the breast.  From a respiratory standpoint she is doing well.  Still using her Breztri inhaler.   Past Medical History:  Diagnosis Date   Acute respiratory failure with hypoxia ([place]) 09/30/2018   Constipation    COPD exacerbation ([place]) 01/05/2019   Depression with anxiety 09/30/2018   Essential hypertension 09/30/2018   Hypertension    Tobacco dependence due to cigarettes 09/30/2018     Family History  Problem Relation Age of Onset   COPD Mother    Throat cancer Maternal Grandmother    Prostate cancer Other    Prostate cancer Other    Prostate cancer Other    Esophageal cancer Maternal Aunt      Past Surgical History:  Procedure Laterality Date   LAPAROTOMY  1974   RIGHT/LEFT HEART CATH AND CORONARY ANGIOGRAPHY N/A 10/02/2019   Procedure: RIGHT/LEFT HEART CATH AND CORONARY ANGIOGRAPHY;  Surgeon: Sherren Mocha, MD;  Location: [facility];  Service: Cardiovascular;  Laterality: N/A;    Social History   Socioeconomic History   Marital status: Single    Spouse name: Not on file   Number of children: Not on file   Years of education: Not on file   Highest education level: Not on file  Occupational History   Not on file  Tobacco Use   Smoking status: Every Day    Packs/day: 0.80    Years: 43.00    Pack years: 34.40    Types: Cigarettes   Smokeless tobacco: Never  Substance and Sexual Activity   Alcohol use: Yes    Comment: gets 6 beers on a weekend and drinks them   Drug use: Not Currently    Types: Marijuana    Comment: tried to smoke some about a month ago   Sexual activity: Not on file  Other Topics Concern   Not on file  Social History Narrative   Not on file   Social Determinants of Health   Financial Resource Strain: Not on file  Food Insecurity: Not on file   Transportation Needs: Not on file  Physical Activity: Not on file  Stress: Not on file  Social Connections: Not on file  Intimate Partner Violence: Not on file     Allergies  Allergen Reactions   Crab [Shellfish Allergy] Swelling   Erythromycin Other (See Comments)    convulsion     Outpatient Medications Prior to Visit  Medication Sig Dispense Refill   ALPRAZolam (XANAX) 0.5 MG tablet Take 0.5 mg by mouth at bedtime as needed for sleep.     amLODipine (NORVASC) 5 MG tablet Take 1 tablet (5 mg total) by mouth daily. 90 tablet 3   aspirin EC 81 MG tablet Take 81 mg by mouth daily.  atorvastatin (LIPITOR) 10 MG tablet Take 1 tablet (10 mg total) by mouth daily. 90 tablet 3   benazepril (LOTENSIN) 40 MG tablet Take 40 mg by mouth daily.     Budeson-Glycopyrrol-Formoterol (BREZTRI AEROSPHERE) 160-9-4.8 MCG/ACT AERO Inhale 2 puffs into the lungs in the morning and at bedtime. 32.1 g 1   ipratropium-albuterol (DUONEB) 0.5-2.5 (3) MG/3ML SOLN Take 3 mLs by nebulization 4 (four) times daily as needed (shortness of breath).     loratadine (CLARITIN) 10 MG tablet Take 10 mg by mouth daily as needed for allergies.     metoprolol tartrate (LOPRESSOR) 25 MG tablet Take 1 tablet (25 mg total) by mouth 2 (two) times daily. 180 tablet 3   Multiple Vitamins-Minerals (CENTRUM SILVER 50+WOMEN) TABS Take 1 tablet by mouth daily.     Omega-3 Fatty Acids (FISH OIL) 1000 MG CAPS Take 1,000 mg by mouth daily.     polyethylene glycol (MIRALAX / GLYCOLAX) 17 g packet Take 17 g by mouth daily. 14 each 0   PROAIR HFA 108 (90 Base) MCG/ACT inhaler Inhale 2 puffs into the lungs every 6 (six) hours as needed for wheezing or shortness of breath. 90 day supply 18 g 3   vitamin B-12 (CYANOCOBALAMIN) 500 MCG tablet Take 500 mcg by mouth daily.     No facility-administered medications prior to visit.    Review of Systems  Constitutional:  Negative for chills, fever, malaise/fatigue and weight loss.  HENT:   Negative for hearing loss, sore throat and tinnitus.   Eyes:  Negative for blurred vision and double vision.  Respiratory:  Positive for shortness of breath. Negative for cough, hemoptysis, sputum production, wheezing and stridor.   Cardiovascular:  Negative for chest pain, palpitations, orthopnea, leg swelling and PND.  Gastrointestinal:  Negative for abdominal pain, constipation, diarrhea, heartburn, nausea and vomiting.  Genitourinary:  Negative for dysuria, hematuria and urgency.  Musculoskeletal:  Negative for joint pain and myalgias.  Skin:  Negative for itching and rash.  Neurological:  Negative for dizziness, tingling, weakness and headaches.  Endo/Heme/Allergies:  Negative for environmental allergies. Does not bruise/bleed easily.  Psychiatric/Behavioral:  Negative for depression. The patient is not nervous/anxious and does not have insomnia.   All other systems reviewed and are negative.   Objective:  Physical Exam Vitals reviewed.  Constitutional:      General: She is not in acute distress.    Appearance: She is well-developed.  HENT:     Head: Normocephalic and atraumatic.  Eyes:     General: No scleral icterus.    Conjunctiva/sclera: Conjunctivae normal.     Pupils: Pupils are equal, round, and reactive to light.  Neck:     Vascular: No JVD.     Trachea: No tracheal deviation.  Cardiovascular:     Rate and Rhythm: Normal rate and regular rhythm.     Heart sounds: Normal heart sounds. No murmur heard. Pulmonary:     Effort: Pulmonary effort is normal. No tachypnea, accessory muscle usage or respiratory distress.     Breath sounds: No stridor. No wheezing, rhonchi or rales.  Abdominal:     General: There is no distension.     Palpations: Abdomen is soft.     Tenderness: There is no abdominal tenderness.  Musculoskeletal:        General: No tenderness.     Cervical back: Neck supple.  Lymphadenopathy:     Cervical: No cervical adenopathy.  Skin:    General: Skin  is warm and dry.  Capillary Refill: Capillary refill takes less than 2 seconds.     Findings: No rash.  Neurological:     Mental Status: She is alert and oriented to person, place, and time.  Psychiatric:        Behavior: Behavior normal.     Vitals:   07/02/21 1548  BP: (!) 160/82  Pulse: 93  Temp: 98.2 F (36.8 C)  TempSrc: Oral  SpO2: 93%  Weight: 132 lb 9.6 oz (60.1 kg)  Height: 5\' 10"  (1.778 m)   93% on RA BMI Readings from Last 3 Encounters:  07/02/21 19.03 kg/m  06/12/21 18.65 kg/m  06/03/21 18.52 kg/m   Wt Readings from Last 3 Encounters:  07/02/21 132 lb 9.6 oz (60.1 kg)  06/12/21 130 lb (59 kg)  06/03/21 130 lb 6.4 oz (59.1 kg)     CBC    Component Value Date/Time   WBC 11.2 (H) 06/03/2021 1234   WBC 9.4 01/05/2019 0219   RBC 4.80 06/03/2021 1234   HGB 15.6 (H) 06/03/2021 1234   HGB 17.3 (H) 09/26/2019 1650   HCT 47.0 (H) 06/03/2021 1234   HCT 52.2 (H) 09/26/2019 1650   PLT 247 06/03/2021 1234   PLT 236 09/26/2019 1650   MCV 97.9 06/03/2021 1234   MCV 88 09/26/2019 1650   MCH 32.5 06/03/2021 1234   MCHC 33.2 06/03/2021 1234   RDW 13.2 06/03/2021 1234   RDW 19.4 (H) 09/26/2019 1650   LYMPHSABS 2.5 06/03/2021 1234   MONOABS 0.7 06/03/2021 1234   EOSABS 0.2 06/03/2021 1234   BASOSABS 0.0 06/03/2021 1234    Chest Imaging: CXR - 8/14 Cardiomegaly, evidence of emphysema  The patient's images have been independently reviewed by me.   09/30/2018 CT chest: Multiple bilateral pulmonary nodules largest 6 mm the right upper lobe recommending noncontrast CT follow-up 3 to 6 months. The patient's images have been independently reviewed by me.    CT chest 02/22/2019: Previously noted pulmonary nodules decreased in size considered benign. Diffuse bronchial wall thickening consistent with COPD history. Three-vessel coronary disease. The patient's images have been independently reviewed by me.    Pulmonary Functions Testing Results: No flowsheet  data found.  FeNO: None   Pathology: None   05/20/2021: Breast, right, needle core biopsy, 11 o'clock 8cmfn - INVASIVE MAMMARY CARCINOMA, GRADE 2, INVOLVING FOUR OF FOUR BIOPSY FRAGMENTS. - TUMOR RANGES TO 0.8 CM IN MAXIMUM EXTENT  Echocardiogram: None   Heart Catheterization: None     Assessment & Plan:     ICD-10-CM   1. Centrilobular emphysema ([place])  J43.2     2. Chronic obstructive pulmonary disease, unspecified COPD type ([place])  J44.9     3. Cigarette smoker  F17.210     4. Multiple lung nodules  R91.8       Assessment:   This is a 66 year old female, centrilobular emphysema likely COPD currently managed with triple therapy inhaler.  She is a former smoker recently quit doing well after she has quit smoking  Plan: She has a planned lumpectomy. I believe that she is low risk for perioperative pulmonary complications. She needs to maintain her inhaler use before and after surgery. Continue to refrain from picking back up cigarettes. If she needs any support postoperatively from the pulmonary service will be happy to see her.     Current Outpatient Medications:    ALPRAZolam (XANAX) 0.5 MG tablet, Take 0.5 mg by mouth at bedtime as needed for sleep., Disp: , Rfl:    amLODipine (  NORVASC) 5 MG tablet, Take 1 tablet (5 mg total) by mouth daily., Disp: 90 tablet, Rfl: 3   aspirin EC 81 MG tablet, Take 81 mg by mouth daily., Disp: , Rfl:    atorvastatin (LIPITOR) 10 MG tablet, Take 1 tablet (10 mg total) by mouth daily., Disp: 90 tablet, Rfl: 3   benazepril (LOTENSIN) 40 MG tablet, Take 40 mg by mouth daily., Disp: , Rfl:    Budeson-Glycopyrrol-Formoterol (BREZTRI AEROSPHERE) 160-9-4.8 MCG/ACT AERO, Inhale 2 puffs into the lungs in the morning and at bedtime., Disp: 32.1 g, Rfl: 1   ipratropium-albuterol (DUONEB) 0.5-2.5 (3) MG/3ML SOLN, Take 3 mLs by nebulization 4 (four) times daily as needed (shortness of breath)., Disp: , Rfl:    loratadine (CLARITIN) 10 MG tablet,  Take 10 mg by mouth daily as needed for allergies., Disp: , Rfl:    metoprolol tartrate (LOPRESSOR) 25 MG tablet, Take 1 tablet (25 mg total) by mouth 2 (two) times daily., Disp: 180 tablet, Rfl: 3   Multiple Vitamins-Minerals (CENTRUM SILVER 50+WOMEN) TABS, Take 1 tablet by mouth daily., Disp: , Rfl:    Omega-3 Fatty Acids (FISH OIL) 1000 MG CAPS, Take 1,000 mg by mouth daily., Disp: , Rfl:    polyethylene glycol (MIRALAX / GLYCOLAX) 17 g packet, Take 17 g by mouth daily., Disp: 14 each, Rfl: 0   PROAIR HFA 108 (90 Base) MCG/ACT inhaler, Inhale 2 puffs into the lungs every 6 (six) hours as needed for wheezing or shortness of breath. 90 day supply, Disp: 18 g, Rfl: 3   vitamin B-12 (CYANOCOBALAMIN) 500 MCG tablet, Take 500 mcg by mouth daily., Disp: , Rfl:    Garner Nash, DO [facility] 07/02/2021 4:03 PM

## 2021-07-02 NOTE — Telephone Encounter (Signed)
OV notes and clearance note have been faxed to [facility]. Nothing further needed at this time

## 2021-07-06 ENCOUNTER — Encounter: Payer: Self-pay | Admitting: General Practice

## 2021-07-06 ENCOUNTER — Encounter: Payer: Self-pay | Admitting: *Deleted

## 2021-07-06 NOTE — Progress Notes (Signed)
[place] Psychosocial Distress Screening Spiritual Care  Met with Stephonie by phone following [facility] to introduce [place] team/resources, reviewing distress screen per protocol.  The patient scored a 8 on the Psychosocial Distress Thermometer which indicates severe distress. Also assessed for distress and other psychosocial needs.   ON[facility]N DISTRESS SCREENING 07/06/2021  Screening Type Initial Screening  Distress experienced in past week (1-10) 8  Emotional problem type Nervousness/Anxiety;Adjusting to illness;Adjusting to appearance changes  Physical Problem type Bathing/dressing;Breathing;Loss of appetitie;Constipation/diarrhea;Skin dry/itchy  Referral to support programs Yes   Ms Charpentier was very welcoming of call, noting that her distress is probably higher now because of the wait for surgery, especially since she has started feeling pain in the area of the cancer. She also notes that despite a prescription from her PCP, she is still having notable trouble sleeping (waking up between 1:00 and 3:00 am, and not getting back to sleep until perhaps a lucky nap during the day).  Provided empathic listening, emotional support, and reminder of [place] and [employer] support programming.   Follow up needed: Yes.  We plan to follow up by phone in ca 2 weeks.   [place], [place], [facility] Pager [phone] Voicemail [phone]

## 2021-07-07 ENCOUNTER — Ambulatory Visit: Payer: Self-pay | Admitting: General Surgery

## 2021-07-07 DIAGNOSIS — Z170 Estrogen receptor positive status [ER+]: Secondary | ICD-10-CM

## 2021-07-09 ENCOUNTER — Encounter: Payer: Self-pay | Admitting: *Deleted

## 2021-07-09 ENCOUNTER — Telehealth: Payer: Self-pay | Admitting: Hematology and Oncology

## 2021-07-09 NOTE — Telephone Encounter (Signed)
Sch per 2/15 inbasket, pt aware °

## 2021-07-14 NOTE — Pre-Procedure Instructions (Signed)
Surgical Instructions    Your procedure is scheduled on Thursday, March 2nd.  Report to [facility] Main Entrance "A" at 6:30 A.M., then check in with the Admitting office.  Call this number if you have problems the morning of surgery:  [phone]   If you have any questions prior to your surgery date call [phone]: Open Monday-Friday 8am-4pm    Remember:  Do not eat after midnight the night before your surgery  You may drink clear liquids until 5:30 a.m. the morning of your surgery.   Clear liquids allowed are: Water, Non-Citrus Juices (without pulp), Carbonated Beverages, Clear Tea, Black Coffee ONLY (NO MILK, CREAM OR POWDERED CREAMER of any kind), and Gatorade    Take these medicines the morning of surgery with A SIP OF WATER:  amLODipine (NORVASC)  atorvastatin (LIPITOR) Budeson-Glycopyrrol-Formoterol (BREZTRI AEROSPHERE)  metoprolol tartrate (LOPRESSOR)   As needed: HYDROcodone-acetaminophen (NORCO/VICODIN) ipratropium-albuterol (DUONEB) PROAIR HFA   Follow your surgeon's instructions on when to stop Aspirin.  If no instructions were given by your surgeon then you will need to call the office to get those instructions.    As of today, STOP taking any Aleve, Naproxen, Ibuprofen, Motrin, Advil, Goody's, BC's, all herbal medications, fish oil, and all vitamins.           Do not wear jewelry or makeup Do not wear lotions, powders, perfumes, or deodorant. Do not shave 48 hours prior to surgery.  Do not bring valuables to the hospital. Do not wear nail polish, gel polish, artificial nails, or any other type of covering on natural nails (fingers and toes) If you have artificial nails or gel coating that need to be removed by a nail salon, please have this removed prior to surgery. Artificial nails or gel coating may interfere with anesthesia's ability to adequately monitor your vital signs.  [facility] is not responsible for any belongings or valuables. .   Do NOT Smoke  (Tobacco/Vaping)  24 hours prior to your procedure  If you use a CPAP at night, you may bring your mask for your overnight stay.   Contacts, glasses, hearing aids, dentures or partials may not be worn into surgery, please bring cases for these belongings   For patients admitted to the hospital, discharge time will be determined by your treatment team.   Patients discharged the day of surgery will not be allowed to drive home, and someone needs to stay with them for 24 hours.  NO VISITORS WILL BE ALLOWED IN PRE-OP WHERE PATIENTS ARE PREPPED FOR SURGERY.  ONLY 1 SUPPORT PERSON MAY BE PRESENT IN THE WAITING ROOM WHILE YOU ARE IN SURGERY.  IF YOU ARE TO BE ADMITTED, ONCE YOU ARE IN YOUR ROOM YOU WILL BE ALLOWED TWO (2) VISITORS. 1 (ONE) VISITOR MAY STAY OVERNIGHT BUT MUST ARRIVE TO THE ROOM BY 8pm.  Minor children may have two parents present. Special consideration for safety and communication needs will be reviewed on a case by case basis.  Special instructions:    Oral Hygiene is also important to reduce your risk of infection.  Remember - BRUSH YOUR TEETH THE MORNING OF SURGERY WITH YOUR REGULAR TOOTHPASTE   [facility]- Preparing For Surgery  Before surgery, you can play an important role. Because skin is not sterile, your skin needs to be as free of germs as possible. You can reduce the number of germs on your skin by washing with CHG (chlorahexidine gluconate) Soap before surgery.  CHG is an antiseptic cleaner which kills  germs and bonds with the skin to continue killing germs even after washing.     Please do not use if you have an allergy to CHG or antibacterial soaps. If your skin becomes reddened/irritated stop using the CHG.  Do not shave (including legs and underarms) for at least 48 hours prior to first CHG shower. It is OK to shave your face.  Please follow these instructions carefully.     Shower the NIGHT BEFORE SURGERY and the MORNING OF SURGERY with CHG Soap.   If you chose  to wash your hair, wash your hair first as usual with your normal shampoo. After you shampoo, rinse your hair and body thoroughly to remove the shampoo.  Then [employer] and genitals (private parts) with your normal soap and rinse thoroughly to remove soap.  After that Use CHG Soap as you would any other liquid soap. You can apply CHG directly to the skin and wash gently with a scrungie or a clean washcloth.   Apply the CHG Soap to your body ONLY FROM THE NECK DOWN.  Do not use on open wounds or open sores. Avoid contact with your eyes, ears, mouth and genitals (private parts). Wash Face and genitals (private parts)  with your normal soap.   Wash thoroughly, paying special attention to the area where your surgery will be performed.  Thoroughly rinse your body with warm water from the neck down.  DO NOT shower/wash with your normal soap after using and rinsing off the CHG Soap.  Pat yourself dry with a CLEAN TOWEL.  Wear CLEAN PAJAMAS to bed the night before surgery  Place CLEAN SHEETS on your bed the night before your surgery  DO NOT SLEEP WITH PETS.   Day of Surgery:  Take a shower with CHG soap. Wear Clean/Comfortable clothing the morning of surgery Do not apply any deodorants/lotions.   Remember to brush your teeth WITH YOUR REGULAR TOOTHPASTE.    COVID testing  If you are going to stay overnight or be admitted after your procedure/surgery and require a pre-op COVID test, please follow these instructions after your COVID test   You are not required to quarantine however you are required to wear a well-fitting mask when you are out and around people not in your household.  If your mask becomes wet or soiled, replace with a new one.  Wash your hands often with soap and water for 20 seconds or clean your hands with an alcohol-based hand sanitizer that contains at least 60% alcohol.  Do not share personal items.  Notify your provider: if you are in close contact with someone  who has COVID  or if you develop a fever of 100.4 or greater, sneezing, cough, sore throat, shortness of breath or body aches.    Please read over the following fact sheets that you were given.

## 2021-07-15 ENCOUNTER — Encounter (HOSPITAL_COMMUNITY): Payer: Self-pay

## 2021-07-15 ENCOUNTER — Encounter (HOSPITAL_COMMUNITY)
Admission: RE | Admit: 2021-07-15 | Discharge: 2021-07-15 | Disposition: A | Discharge status: Home / Self Care | Payer: Medicare Other | Source: Ambulatory Visit | Attending: General Surgery | Admitting: General Surgery

## 2021-07-15 ENCOUNTER — Other Ambulatory Visit: Payer: Self-pay

## 2021-07-15 VITALS — BP 130/82 | HR 88 | Temp 99.3°F | Resp 18 | Ht 70.0 in | Wt 130.2 lb

## 2021-07-15 DIAGNOSIS — Z01812 Encounter for preprocedural laboratory examination: Secondary | ICD-10-CM | POA: Diagnosis present

## 2021-07-15 DIAGNOSIS — I10 Essential (primary) hypertension: Secondary | ICD-10-CM | POA: Insufficient documentation

## 2021-07-15 DIAGNOSIS — Z87891 Personal history of nicotine dependence: Secondary | ICD-10-CM | POA: Diagnosis not present

## 2021-07-15 DIAGNOSIS — Z7982 Long term (current) use of aspirin: Secondary | ICD-10-CM | POA: Diagnosis not present

## 2021-07-15 DIAGNOSIS — Z9981 Dependence on supplemental oxygen: Secondary | ICD-10-CM | POA: Insufficient documentation

## 2021-07-15 DIAGNOSIS — I2510 Atherosclerotic heart disease of native coronary artery without angina pectoris: Secondary | ICD-10-CM | POA: Insufficient documentation

## 2021-07-15 DIAGNOSIS — I272 Pulmonary hypertension, unspecified: Secondary | ICD-10-CM | POA: Diagnosis not present

## 2021-07-15 DIAGNOSIS — J449 Chronic obstructive pulmonary disease, unspecified: Secondary | ICD-10-CM | POA: Insufficient documentation

## 2021-07-15 DIAGNOSIS — Z7951 Long term (current) use of inhaled steroids: Secondary | ICD-10-CM | POA: Insufficient documentation

## 2021-07-15 DIAGNOSIS — Z01818 Encounter for other preprocedural examination: Secondary | ICD-10-CM

## 2021-07-15 HISTORY — DX: Dyspnea, unspecified: R06.00

## 2021-07-15 LAB — CBC
HCT: 45.8 % (ref 36.0–46.0)
Hemoglobin: 15.1 g/dL — ABNORMAL HIGH (ref 12.0–15.0)
MCH: 33.4 pg (ref 26.0–34.0)
MCHC: 33 g/dL (ref 30.0–36.0)
MCV: 101.3 fL — ABNORMAL HIGH (ref 80.0–100.0)
Platelets: 304 10*3/uL (ref 150–400)
RBC: 4.52 MIL/uL (ref 3.87–5.11)
RDW: 13.2 % (ref 11.5–15.5)
WBC: 12.1 10*3/uL — ABNORMAL HIGH (ref 4.0–10.5)
nRBC: 0 % (ref 0.0–0.2)

## 2021-07-15 LAB — BASIC METABOLIC PANEL
Anion gap: 11 (ref 5–15)
BUN: <5 mg/dL — ABNORMAL LOW (ref 8–23)
CO2: 30 mmol/L (ref 22–32)
Calcium: 9.4 mg/dL (ref 8.9–10.3)
Chloride: 95 mmol/L — ABNORMAL LOW (ref 98–111)
Creatinine, Ser: 0.57 mg/dL (ref 0.44–1.00)
GFR, Estimated: >60 mL/min (ref >60)
Glucose, Bld: 90 mg/dL (ref 70–99)
Potassium: 3.4 mmol/L — ABNORMAL LOW (ref 3.5–5.1)
Sodium: 136 mmol/L (ref 135–145)

## 2021-07-15 NOTE — Progress Notes (Signed)
PCP - [name] Cardiologist - Dr. Radford Pax Received cardiac clearance on 06/12/21  Pulmonologist - Dr. [name] Received clearance on 07/02/21  Chest x-ray - n/a EKG - 10/25/20 ECHO - 11/14/20 Cardiac Cath - 10/03/19  Aspirin Instructions: follow your surgeon's instructions  ERAS Protcol - yes, no drink given   COVID TEST- n/a (ambulatory)   Anesthesia review: yes, heart history Received cardiac clearance on 06/12/21 Pulmonary clearance on 07/02/21 Pt on home oxygen all the time  Patient denies fever, cough and chest pain at PAT appointment Patient has shortness of breath upon exertion due to COPD, not a new finding   All instructions explained to the patient, with a verbal understanding of the material. Patient agrees to go over the instructions while at home for a better understanding. Patient also instructed to self quarantine after being tested for COVID-19. The opportunity to ask questions was provided.

## 2021-07-16 ENCOUNTER — Encounter: Payer: Self-pay | Admitting: *Deleted

## 2021-07-16 NOTE — Anesthesia Preprocedure Evaluation (Addendum)
Anesthesia Evaluation  Patient identified by MRN, date of birth, ID band Patient awake    Reviewed: Allergy & Precautions, NPO status , Patient's Chart, lab work & pertinent test results  Airway Mallampati: II  TM Distance: >3 FB Neck ROM: Full    Dental no notable dental hx.    Pulmonary COPD (3L ATC),  COPD inhaler and oxygen dependent, Current Smoker and Patient abstained from smoking.,    Pulmonary exam normal        Cardiovascular hypertension, Pt. on medications and Pt. on home beta blockers  Rhythm:Regular Rate:Normal     Neuro/Psych Anxiety Depression negative neurological ROS     GI/Hepatic negative GI ROS, Neg liver ROS,   Endo/Other  negative endocrine ROS  Renal/GU negative Renal ROS  negative genitourinary   Musculoskeletal Breast Ca   Abdominal Normal abdominal exam  (+)   Peds  Hematology negative hematology ROS (+)   Anesthesia Other Findings   Reproductive/Obstetrics                           Anesthesia Physical Anesthesia Plan  ASA: 3  Anesthesia Plan: General and Regional   Post-op Pain Management: Regional block*, Celebrex PO (pre-op)* and Tylenol PO (pre-op)*   Induction: Intravenous  PONV Risk Score and Plan: 2 and Ondansetron, Dexamethasone and Treatment may vary due to age or medical condition  Airway Management Planned: Mask and LMA  Additional Equipment: None  Intra-op Plan:   Post-operative Plan: Extubation in OR  Informed Consent: I have reviewed the patients History and Physical, chart, labs and discussed the procedure including the risks, benefits and alternatives for the proposed anesthesia with the patient or authorized representative who has indicated his/her understanding and acceptance.     Dental advisory given  Plan Discussed with: CRNA  Anesthesia Plan Comments: (PAT note by Karoline Caldwell, PA-C: Follows with pulmonology for history of  COPD maintained on triple therapy inhaler and on 3L continuous supplemental oxygen. Former smoker, neurology notes states she recently quit.  Last seen by Dr. [name] 07/02/2021 for preop evaluation.  Per note, "She has a planned lumpectomy. I believe that she is low risk for perioperative pulmonary complications. She needs to maintain her inhaler use before and after surgery. Continue to refrain from picking back up cigarettes. If she needs any support postoperatively from the pulmonary service will be happy to see her."  Follows with cardiology for history of mild to moderate nonobstructive CAD, moderate pulmonary hypertension, HTN. Cardiac cath 08/2019 50% mRCA, 50% OM1, 50% prox to mid LCx, 30% prox to mid LAD, 50% D1 with moderate PHTN felt to be related to COPD. Recommended for aggressive medical therapy. Echo 10/2020 LVEF 65-70%, no RWMA, mild LVH, gr2DD, mildly elevated PASP, LA mildtomoderately dilated, trivial MR, mild aortic valve sclerosis without stenosis, borderline dilation of ascending aorta 88mm.  Last seen 06/12/2021 for preop evaluation.  Per note, "Preop clearance -Upcoming lumpectomy.According to the Revised Cardiac Risk Index (RCRI),herPerioperative Risk of Major Cardiac Event is (%): 0.9. HerFunctional Capacity in METs is: 4.73according to the Duke Activity Status Index (DASI).She is acceptable risk for her planned procedure without additional cardiovascular testing. Given her coronary disease, would prefer she remain on aspirin throughout perioperative period but could be held if surgeon deems necessary."  Preop labs reviewed, unremarkable.  EKG 06/12/2021: Sinus rhythm with premature atrial complexes.  Rate 83.  Rightward axis.  Incomplete right bundle branch block.  Lung cancer screening CT 03/10/2020:  IMPRESSION: 1. Lung-RADS 2S, benign appearance or behavior. Continue annual screening with low-dose chest CT without contrast in 12 months. 2. The "S" modifier above refers to  potentially clinically significant non lung cancer related findings. Specifically, there is aortic atherosclerosis, in addition to left main and 3 vessel coronary artery disease. Please note that although the presence of coronary artery calcium documents the presence of coronary artery disease, the severity of this disease and any potential stenosis cannot be assessed on this non-gated CT examination. Assessment for potential risk factor modification, dietary therapy or pharmacologic therapy may be warranted, if clinically indicated. 3. Mild diffuse bronchial wall thickening with mild centrilobular and paraseptal emphysema; imaging findings suggestive of underlying COPD. 4. There are calcifications of the aortic valve. Echocardiographic correlation for evaluation of potential valvular dysfunction may be warranted if clinically indicated.  Aortic Atherosclerosis (ICD10-I70.0) and Emphysema (ICD10-J43.9).  TTE 11/13/2020: 1. Left ventricular ejection fraction, by estimation, is 65 to 70%. The  left ventricle has normal function. The left ventricle has no regional  wall motion abnormalities. There is mild concentric left ventricular  hypertrophy. Left ventricular diastolic  parameters are consistent with Grade II diastolic dysfunction  (pseudonormalization).  2. Right ventricular systolic function is normal. The right ventricular  size is normal. There is mildly elevated pulmonary artery systolic  pressure. The estimated right ventricular systolic pressure is 30.0 mmHg.  3. Left atrial size was mild to moderately dilated.  4. The mitral valve is abnormal. Trivial mitral valve regurgitation. No  evidence of mitral stenosis.  5. The aortic valve has an indeterminant number of cusps. There is mild  calcification of the aortic valve. Aortic valve regurgitation is not  visualized. Mild aortic valve sclerosis is present, with no evidence of  aortic valve stenosis.  6. Aortic  dilatation noted. There is borderline dilatation of the  ascending aorta, measuring 37 mm.  7. The inferior vena cava is normal in size with greater than 50%  respiratory variability, suggesting right atrial pressure of 3 mmHg.   Comparison(s): No significant change from prior study.   Cath 10/02/2019:  Mid RCA lesion is 50% stenosed.  1st Mrg lesion is 50% stenosed.  Prox Cx to Mid Cx lesion is 50% stenosed.  Prox LAD to Mid LAD lesion is 30% stenosed.  1st Diag lesion is 50% stenosed.  LV end diastolic pressure is normal.  1. Mild to moderate multivessel nonobstructive coronary artery disease as outlined with 50% stenosis of the mid RCA, 30 to 40% stenosis of the proximal to mid LAD, 50% diagonal ostial stenosis, and 40 to 50% stenosis of the left circumflex/first OM bifurcation 2. Normal LVEDP 3. Moderate pulmonary hypertension with PVR of 5.1 Wood units, preserved cardiac output and normal RA pressure (likely secondary to chronic lung disease)  Recommend: medical therapy  )      Anesthesia Quick Evaluation

## 2021-07-16 NOTE — Progress Notes (Signed)
Anesthesia Chart Review:  Follows with pulmonology for history of COPD maintained on triple therapy inhaler and on 3L continuous supplemental oxygen. Former smoker, neurology notes states she recently quit.  Last seen by Dr. [name] 07/02/2021 for preop evaluation.  Per note, "She has a planned lumpectomy. I believe that she is low risk for perioperative pulmonary complications. She needs to maintain her inhaler use before and after surgery. Continue to refrain from picking back up cigarettes. If she needs any support postoperatively from the pulmonary service will be happy to see her."  Follows with cardiology for history of mild to moderate nonobstructive CAD, moderate pulmonary hypertension, HTN. Cardiac cath 08/2019 50% mRCA, 50% OM1, 50% prox to mid LCx, 30% prox to mid LAD, 50% D1 with moderate PHTN felt to be related to COPD. Recommended for aggressive medical therapy. Echo 10/2020 LVEF 65-70%, no RWMA, mild LVH, gr2DD, mildly elevated PASP, LA mild to moderately dilated, trivial MR, mild aortic valve sclerosis without stenosis, borderline dilation of ascending aorta 41mm.  Last seen 06/12/2021 for preop evaluation.  Per note, "Preop clearance - Upcoming lumpectomy. According to the Revised Cardiac Risk Index (RCRI), her Perioperative Risk of Major Cardiac Event is (%): 0.9. Her  Functional Capacity in METs is: 4.73 according to the Duke Activity Status Index (DASI). She is acceptable risk for her planned procedure without additional cardiovascular testing. Given her coronary disease, would prefer she remain on aspirin throughout perioperative period but could be held if surgeon deems necessary."  Preop labs reviewed, unremarkable.  EKG 06/12/2021: Sinus rhythm with premature atrial complexes.  Rate 83.  Rightward axis.  Incomplete right bundle branch block.  Lung cancer screening CT 03/10/2020: IMPRESSION: 1. Lung-RADS 2S, benign appearance or behavior. Continue annual screening with low-dose chest CT  without contrast in 12 months. 2. The "S" modifier above refers to potentially clinically significant non lung cancer related findings. Specifically, there is aortic atherosclerosis, in addition to left main and 3 vessel coronary artery disease. Please note that although the presence of coronary artery calcium documents the presence of coronary artery disease, the severity of this disease and any potential stenosis cannot be assessed on this non-gated CT examination. Assessment for potential risk factor modification, dietary therapy or pharmacologic therapy may be warranted, if clinically indicated. 3. Mild diffuse bronchial wall thickening with mild centrilobular and paraseptal emphysema; imaging findings suggestive of underlying COPD. 4. There are calcifications of the aortic valve. Echocardiographic correlation for evaluation of potential valvular dysfunction may be warranted if clinically indicated.   Aortic Atherosclerosis (ICD10-I70.0) and Emphysema (ICD10-J43.9).   TTE 11/13/2020:  1. Left ventricular ejection fraction, by estimation, is 65 to 70%. The  left ventricle has normal function. The left ventricle has no regional  wall motion abnormalities. There is mild concentric left ventricular  hypertrophy. Left ventricular diastolic  parameters are consistent with Grade II diastolic dysfunction  (pseudonormalization).   2. Right ventricular systolic function is normal. The right ventricular  size is normal. There is mildly elevated pulmonary artery systolic  pressure. The estimated right ventricular systolic pressure is 29.5 mmHg.   3. Left atrial size was mild to moderately dilated.   4. The mitral valve is abnormal. Trivial mitral valve regurgitation. No  evidence of mitral stenosis.   5. The aortic valve has an indeterminant number of cusps. There is mild  calcification of the aortic valve. Aortic valve regurgitation is not  visualized. Mild aortic valve sclerosis is  present, with no evidence of  aortic valve stenosis.  6. Aortic dilatation noted. There is borderline dilatation of the  ascending aorta, measuring 37 mm.   7. The inferior vena cava is normal in size with greater than 50%  respiratory variability, suggesting right atrial pressure of 3 mmHg.   Comparison(s): No significant change from prior study.   Cath 10/02/2019: Mid RCA lesion is 50% stenosed. 1st Mrg lesion is 50% stenosed. Prox Cx to Mid Cx lesion is 50% stenosed. Prox LAD to Mid LAD lesion is 30% stenosed. 1st Diag lesion is 50% stenosed. LV end diastolic pressure is normal.   1.  Mild to moderate multivessel nonobstructive coronary artery disease as outlined with 50% stenosis of the mid RCA, 30 to 40% stenosis of the proximal to mid LAD, 50% diagonal ostial stenosis, and 40 to 50% stenosis of the left circumflex/first OM bifurcation 2.  Normal LVEDP 3.  Moderate pulmonary hypertension with PVR of 5.1 Wood units, preserved cardiac output and normal RA pressure (likely secondary to chronic lung disease)   Recommend: medical therapy    Wynonia Musty [facility] Short Stay Center/Anesthesiology Phone [phone] 07/16/2021 3:21 PM

## 2021-07-20 ENCOUNTER — Encounter: Payer: Self-pay | Admitting: General Practice

## 2021-07-20 NOTE — Progress Notes (Signed)
[facility] Spiritual Care Note  Followed up with Ms Caroline Greer by phone as planned. She was welcoming of chaplain check-in and shared that she is finding xanax helpful after a few nights of very poor sleep and she is looking forward to being on the other side of surgery, which is helping her manage her distress. Encouraged Sales promotion account executive Care/[facility] support programming for additional layers of meaning-making and community-building, and plan to phone at the end of next week to check in post-operatively.   [place], [place], [facility] Pager [phone] Voicemail [phone]

## 2021-07-23 ENCOUNTER — Ambulatory Visit (HOSPITAL_BASED_OUTPATIENT_CLINIC_OR_DEPARTMENT_OTHER): Payer: Medicare Other | Admitting: Anesthesiology

## 2021-07-23 ENCOUNTER — Ambulatory Visit (HOSPITAL_COMMUNITY)
Admission: RE | Admit: 2021-07-23 | Discharge: 2021-07-23 | Disposition: A | Discharge status: Home / Self Care | Payer: Medicare Other | Source: Ambulatory Visit | Attending: General Surgery | Admitting: General Surgery

## 2021-07-23 ENCOUNTER — Ambulatory Visit (HOSPITAL_COMMUNITY): Payer: Medicare Other | Admitting: Physician Assistant

## 2021-07-23 ENCOUNTER — Encounter (HOSPITAL_COMMUNITY)
Admission: RE | Disposition: A | Discharge status: Home / Self Care | Payer: Self-pay | Source: Ambulatory Visit | Attending: General Surgery

## 2021-07-23 ENCOUNTER — Other Ambulatory Visit: Payer: Self-pay

## 2021-07-23 ENCOUNTER — Encounter (HOSPITAL_COMMUNITY): Payer: Self-pay | Admitting: General Surgery

## 2021-07-23 DIAGNOSIS — Z170 Estrogen receptor positive status [ER+]: Secondary | ICD-10-CM | POA: Insufficient documentation

## 2021-07-23 DIAGNOSIS — F32A Depression, unspecified: Secondary | ICD-10-CM | POA: Insufficient documentation

## 2021-07-23 DIAGNOSIS — F17210 Nicotine dependence, cigarettes, uncomplicated: Secondary | ICD-10-CM | POA: Diagnosis not present

## 2021-07-23 DIAGNOSIS — F419 Anxiety disorder, unspecified: Secondary | ICD-10-CM | POA: Diagnosis not present

## 2021-07-23 DIAGNOSIS — I110 Hypertensive heart disease with heart failure: Secondary | ICD-10-CM | POA: Insufficient documentation

## 2021-07-23 DIAGNOSIS — Z9981 Dependence on supplemental oxygen: Secondary | ICD-10-CM | POA: Diagnosis not present

## 2021-07-23 DIAGNOSIS — I509 Heart failure, unspecified: Secondary | ICD-10-CM | POA: Insufficient documentation

## 2021-07-23 DIAGNOSIS — J449 Chronic obstructive pulmonary disease, unspecified: Secondary | ICD-10-CM | POA: Diagnosis not present

## 2021-07-23 DIAGNOSIS — I10 Essential (primary) hypertension: Secondary | ICD-10-CM | POA: Diagnosis not present

## 2021-07-23 DIAGNOSIS — C50911 Malignant neoplasm of unspecified site of right female breast: Secondary | ICD-10-CM | POA: Diagnosis not present

## 2021-07-23 DIAGNOSIS — C50411 Malignant neoplasm of upper-outer quadrant of right female breast: Secondary | ICD-10-CM | POA: Diagnosis present

## 2021-07-23 HISTORY — PX: BREAST LUMPECTOMY WITH RADIOACTIVE SEED AND SENTINEL LYMPH NODE BIOPSY: SHX6550

## 2021-07-23 SURGERY — BREAST LUMPECTOMY WITH RADIOACTIVE SEED AND SENTINEL LYMPH NODE BIOPSY
Anesthesia: Regional | Site: Breast | Laterality: Right

## 2021-07-23 MED ORDER — OXYCODONE HCL 5 MG PO TABS: 5.0000 mg | ORAL_TABLET | Freq: Four times a day (QID) | ORAL | 0 refills | Status: DC | PRN

## 2021-07-23 MED ORDER — CHLORHEXIDINE GLUCONATE CLOTH 2 % EX PADS: 6.0000 | MEDICATED_PAD | Freq: Once | CUTANEOUS | Status: DC

## 2021-07-23 MED ORDER — CHLORHEXIDINE GLUCONATE 0.12 % MT SOLN
15.0000 mL | Freq: Once | OROMUCOSAL | Status: AC
  Administered 2021-07-23: 15 mL via OROMUCOSAL
  Filled 2021-07-23: qty 15

## 2021-07-23 MED ORDER — FENTANYL CITRATE PF 50 MCG/ML IJ SOSY
50.0000 ug | PREFILLED_SYRINGE | Freq: Once | INTRAMUSCULAR | Status: DC
  Filled 2021-07-23: qty 1

## 2021-07-23 MED ORDER — MAGTRACE LYMPHATIC TRACER
INTRAMUSCULAR | Status: DC | PRN
  Administered 2021-07-23: 2 mL via INTRAMUSCULAR

## 2021-07-23 MED ORDER — FENTANYL CITRATE (PF) 250 MCG/5ML IJ SOLN
INTRAMUSCULAR | Status: AC
  Filled 2021-07-23: qty 5

## 2021-07-23 MED ORDER — BUPIVACAINE-EPINEPHRINE (PF) 0.25% -1:200000 IJ SOLN
INTRAMUSCULAR | Status: AC
  Filled 2021-07-23: qty 30

## 2021-07-23 MED ORDER — MIDAZOLAM HCL 2 MG/2ML IJ SOLN
2.0000 mg | Freq: Once | INTRAMUSCULAR | Status: AC
  Filled 2021-07-23: qty 2

## 2021-07-23 MED ORDER — DEXAMETHASONE SODIUM PHOSPHATE 10 MG/ML IJ SOLN
INTRAMUSCULAR | Status: DC | PRN
  Administered 2021-07-23: 5 mg via INTRAVENOUS

## 2021-07-23 MED ORDER — CLONIDINE HCL (ANALGESIA) 100 MCG/ML EP SOLN
EPIDURAL | Status: DC | PRN
  Administered 2021-07-23: 100 ug

## 2021-07-23 MED ORDER — PHENYLEPHRINE 40 MCG/ML (10ML) SYRINGE FOR IV PUSH (FOR BLOOD PRESSURE SUPPORT)
PREFILLED_SYRINGE | INTRAVENOUS | Status: DC | PRN
Start: 2021-07-23 — End: 2021-07-23
  Administered 2021-07-23: 120 ug via INTRAVENOUS
  Administered 2021-07-23: 80 ug via INTRAVENOUS
  Administered 2021-07-23: 120 ug via INTRAVENOUS
  Administered 2021-07-23: 80 ug via INTRAVENOUS

## 2021-07-23 MED ORDER — ORAL CARE MOUTH RINSE: 15.0000 mL | Freq: Once | OROMUCOSAL | Status: AC

## 2021-07-23 MED ORDER — CEFAZOLIN SODIUM-DEXTROSE 2-4 GM/100ML-% IV SOLN
2.0000 g | INTRAVENOUS | Status: AC
  Administered 2021-07-23: 2 g via INTRAVENOUS
  Filled 2021-07-23: qty 100

## 2021-07-23 MED ORDER — GABAPENTIN 300 MG PO CAPS
300.0000 mg | ORAL_CAPSULE | ORAL | Status: AC
Start: 2021-07-23 — End: 2021-07-23
  Administered 2021-07-23: 300 mg via ORAL
  Filled 2021-07-23: qty 1

## 2021-07-23 MED ORDER — ACETAMINOPHEN 500 MG PO TABS
1000.0000 mg | ORAL_TABLET | Freq: Once | ORAL | Status: DC
  Filled 2021-07-23: qty 2

## 2021-07-23 MED ORDER — ONDANSETRON HCL 4 MG/2ML IJ SOLN
INTRAMUSCULAR | Status: DC | PRN
  Administered 2021-07-23: 4 mg via INTRAVENOUS

## 2021-07-23 MED ORDER — CELECOXIB 200 MG PO CAPS
200.0000 mg | ORAL_CAPSULE | Freq: Once | ORAL | Status: AC
  Administered 2021-07-23: 200 mg via ORAL
  Filled 2021-07-23: qty 1

## 2021-07-23 MED ORDER — PROPOFOL 10 MG/ML IV BOLUS
INTRAVENOUS | Status: AC
  Filled 2021-07-23: qty 20

## 2021-07-23 MED ORDER — MIDAZOLAM HCL 2 MG/2ML IJ SOLN
INTRAMUSCULAR | Status: AC
  Filled 2021-07-23: qty 2

## 2021-07-23 MED ORDER — PROPOFOL 10 MG/ML IV BOLUS
INTRAVENOUS | Status: DC | PRN
  Administered 2021-07-23: 140 mg via INTRAVENOUS

## 2021-07-23 MED ORDER — PHENYLEPHRINE HCL-NACL 20-0.9 MG/250ML-% IV SOLN
INTRAVENOUS | Status: DC | PRN
  Administered 2021-07-23: 50 ug/min via INTRAVENOUS

## 2021-07-23 MED ORDER — ROPIVACAINE HCL 5 MG/ML IJ SOLN
INTRAMUSCULAR | Status: DC | PRN
  Administered 2021-07-23: 30 mL

## 2021-07-23 MED ORDER — BUPIVACAINE-EPINEPHRINE (PF) 0.25% -1:200000 IJ SOLN
INTRAMUSCULAR | Status: DC | PRN
  Administered 2021-07-23: 10 mL

## 2021-07-23 MED ORDER — 0.9 % SODIUM CHLORIDE (POUR BTL) OPTIME
TOPICAL | Status: DC | PRN
  Administered 2021-07-23: 1000 mL

## 2021-07-23 MED ORDER — MIDAZOLAM HCL 2 MG/2ML IJ SOLN
INTRAMUSCULAR | Status: AC
  Administered 2021-07-23: 2 mg via INTRAVENOUS
  Filled 2021-07-23: qty 2

## 2021-07-23 MED ORDER — LACTATED RINGERS IV SOLN: INTRAVENOUS | Status: DC

## 2021-07-23 MED ORDER — FENTANYL CITRATE (PF) 100 MCG/2ML IJ SOLN
INTRAMUSCULAR | Status: AC
  Administered 2021-07-23: 50 ug
  Filled 2021-07-23: qty 2

## 2021-07-23 MED ORDER — FENTANYL CITRATE (PF) 250 MCG/5ML IJ SOLN
INTRAMUSCULAR | Status: DC | PRN
  Administered 2021-07-23 (×2): 25 ug via INTRAVENOUS

## 2021-07-23 MED ORDER — LIDOCAINE 2% (20 MG/ML) 5 ML SYRINGE
INTRAMUSCULAR | Status: DC | PRN
  Administered 2021-07-23: 60 mg via INTRAVENOUS

## 2021-07-23 SURGICAL SUPPLY — 40 items
ADH SKN CLS APL DERMABOND .7 (GAUZE/BANDAGES/DRESSINGS) ×1
APL PRP STRL LF DISP 70% ISPRP (MISCELLANEOUS) ×1
APPLIER CLIP 9.375 MED OPEN (MISCELLANEOUS) ×2
APR CLP MED 9.3 20 MLT OPN (MISCELLANEOUS) ×1
BAG COUNTER SPONGE SURGICOUNT (BAG) IMPLANT
BAG SPNG CNTER NS LX DISP (BAG)
BINDER BREAST LRG (GAUZE/BANDAGES/DRESSINGS) IMPLANT
BINDER BREAST XLRG (GAUZE/BANDAGES/DRESSINGS) IMPLANT
CANISTER SUCT 3000ML PPV (MISCELLANEOUS) ×3 IMPLANT
CHLORAPREP W/TINT 26 (MISCELLANEOUS) ×3 IMPLANT
CLIP APPLIE 9.375 MED OPEN (MISCELLANEOUS) ×2 IMPLANT
CNTNR URN SCR LID CUP LEK RST (MISCELLANEOUS) ×2 IMPLANT
CONT SPEC 4OZ STRL OR WHT (MISCELLANEOUS) ×2
COVER PROBE W GEL 5X96 (DRAPES) ×4 IMPLANT
COVER SURGICAL LIGHT HANDLE (MISCELLANEOUS) ×3 IMPLANT
DERMABOND ADVANCED (GAUZE/BANDAGES/DRESSINGS) ×1
DERMABOND ADVANCED .7 DNX12 (GAUZE/BANDAGES/DRESSINGS) ×2 IMPLANT
DEVICE DUBIN SPECIMEN MAMMOGRA (MISCELLANEOUS) ×3 IMPLANT
DRAPE CHEST BREAST 15X10 FENES (DRAPES) ×3 IMPLANT
ELECT COATED BLADE 2.86 ST (ELECTRODE) ×3 IMPLANT
ELECT REM PT RETURN 9FT ADLT (ELECTROSURGICAL) ×2
ELECTRODE REM PT RTRN 9FT ADLT (ELECTROSURGICAL) ×2 IMPLANT
GLOVE SURG ENC MOIS LTX SZ7.5 (GLOVE) ×6 IMPLANT
GOWN STRL REUS W/ TWL LRG LVL3 (GOWN DISPOSABLE) ×4 IMPLANT
GOWN STRL REUS W/TWL LRG LVL3 (GOWN DISPOSABLE) ×4
KIT BASIN OR (CUSTOM PROCEDURE TRAY) ×3 IMPLANT
KIT MARKER MARGIN INK (KITS) ×3 IMPLANT
LIGHT WAVEGUIDE WIDE FLAT (MISCELLANEOUS) IMPLANT
NDL 18GX1X1/2 (RX/OR ONLY) (NEEDLE) IMPLANT
NDL HYPO 25GX1X1/2 BEV (NEEDLE) ×2 IMPLANT
NEEDLE 18GX1X1/2 (RX/OR ONLY) (NEEDLE) IMPLANT
NEEDLE HYPO 25GX1X1/2 BEV (NEEDLE) ×2 IMPLANT
NS IRRIG 1000ML POUR BTL (IV SOLUTION) ×3 IMPLANT
PACK GENERAL/GYN (CUSTOM PROCEDURE TRAY) ×3 IMPLANT
SUT MNCRL AB 4-0 PS2 18 (SUTURE) ×6 IMPLANT
SUT VIC AB 3-0 SH 18 (SUTURE) ×3 IMPLANT
SYR CONTROL 10ML LL (SYRINGE) ×3 IMPLANT
TOWEL GREEN STERILE (TOWEL DISPOSABLE) ×3 IMPLANT
TOWEL GREEN STERILE FF (TOWEL DISPOSABLE) ×3 IMPLANT
TRACER MAGTRACE VIAL (MISCELLANEOUS) ×1 IMPLANT

## 2021-07-23 NOTE — Anesthesia Procedure Notes (Addendum)
Anesthesia Regional Block: Pectoralis block  ? ?Pre-Anesthetic Checklist: , timeout performed,  Correct Patient, Correct Site, Correct Laterality,  Correct Procedure, Correct Position, site marked,  Risks and benefits discussed,  Surgical consent,  Pre-op evaluation,  At surgeon's request and post-op pain management ? ?Laterality: Right ? ?Prep: Dura Prep     ?  ?Needles:  ?Injection technique: Single-shot ? ?Needle Type: Echogenic Stimulator Needle   ? ? ?Needle Length: 5cm  ?Needle Gauge: 20  ? ? ? ?Additional Needles: ? ? ?Procedures:,,,, ultrasound used (permanent image in chart),,    ?Narrative:  ?Start time: 07/23/2021 8:16 AM ?End time: 07/23/2021 8:21 AM ?Injection made incrementally with aspirations every 5 mL. ? ?Performed by: Personally  ?Anesthesiologist: Darral Dash, DO ? ?Additional Notes: ?Patient identified. Risks/Benefits/Options discussed with patient including but not limited to bleeding, infection, nerve damage, failed block, incomplete pain control. Patient expressed understanding and wished to proceed. All questions were answered. Sterile technique was used throughout the entire procedure. Please see nursing notes for vital signs. Aspirated in 5cc intervals with injection for negative confirmation. Patient was given instructions on fall risk and not to get out of bed. All questions and concerns addressed with instructions to call with any issues or inadequate analgesia.   ?  ? ? ? ? ?

## 2021-07-23 NOTE — H&P (Signed)
?PROVIDER: Landry Corporal, MD ? ?MRN: [id number] ?DOB: 05/22/1956 ?Subjective  ? ?Chief Complaint: Breast Cancer ? ? ?History of Present Illness: ?Caroline Greer is a 66 y.o. female who is seen today as an office consultation at the request of Dr. Pasty Arch for evaluation of Breast Cancer ?.  ? ?We are asked to see the patient in consultation by Dr. [name] to evaluate her for a new right breast cancer. The patient is a 66 year old black female who recently felt a mass in the upper outer quadrant of the right breast about 6 weeks ago. She went to her doctor and was evaluated and found to have a 1.2 cm mass in the upper outer quadrant of the right breast with normal looking lymph nodes. 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 She has no family history of breast cancer. She does smoke. She also has significant COPD and is on home O2. She is followed by the [facility] pulmonology as well as Dr. Golden Hurter in cardiology ? ?Review of Systems: ?A complete review of systems was obtained from the patient. I have reviewed this information and discussed as appropriate with the patient. See HPI as well for other ROS. ? ?ROS  ? ?Medical History: ?Past Medical History:  ?Diagnosis Date  ? Anxiety  ? CHF (congestive heart failure) (CMS-HCC)  ? COPD (chronic obstructive pulmonary disease) (CMS-HCC)  ? Hypertension  ? ?Patient Active Problem List  ?Diagnosis  ? COPD (chronic obstructive pulmonary disease) (CMS-HCC)  ? Essential hypertension  ? Depression with anxiety  ? Malignant neoplasm of upper-outer quadrant of right breast in female, estrogen receptor positive (CMS-HCC)  ? ?Past Surgical History:  ?Procedure Laterality Date  ? Right/Left Heart Cath and Coronary Angiography N/A 10/02/2019  ? EXPLORATORY LAPAROTOMY N/A  ?1974  ? ? ?Allergies  ?Allergen Reactions  ? Erythromycin Other (See Comments)  ?convulsion  ? Shellfish Containing  Products Swelling  ? ?Current Outpatient Medications on File Prior to Visit  ?Medication Sig Dispense Refill  ? amLODIPine (NORVASC) 5 MG tablet Take 5 mg by mouth once daily APPOINTMENT REQUIRED FOR FUTURE REFILLS  ? aspirin 81 MG EC tablet Take 81 mg by mouth once daily  ? atorvastatin (LIPITOR) 10 MG tablet Take by mouth  ? metoprolol tartrate (LOPRESSOR) 25 MG tablet TAKE 1 TABLET BY MOUTH TWICE DAILY . APPOINTMENT REQUIRED FOR FUTURE REFILLS CALL [phone]  ? ALPRAZolam (XANAX) 0.5 MG tablet TAKE 1 TABLET BY MOUTH ONCE DAILY AT NIGHT AT BEDTIME AS NEEDED (Patient not taking: Reported on 06/03/2021)  ? benazepriL (LOTENSIN) 40 MG tablet Take 40 mg by mouth once daily (Patient not taking: Reported on 06/03/2021)  ? budesonide-glycopyrrolate-formoterol (BREZTRI AEROSPHERE) 160-9-4.8 mcg/actuation inhaler Inhale into the lungs (Patient not taking: Reported on 06/03/2021)  ? cyanocobalamin (VITAMIN B12) 500 MCG tablet Take 1 tablet by mouth once daily  ? ipratropium-albuteroL (DUO-NEB) nebulizer solution Inhale into the lungs (Patient not taking: Reported on 06/03/2021)  ? loratadine (CLARITIN) 10 mg tablet Take by mouth (Patient not taking: Reported on 06/03/2021)  ? ?No current facility-administered medications on file prior to visit.  ? ?Family History  ?Problem Relation Age of Onset  ? COPD Mother  ? ? ?Social History  ? ?Tobacco Use  ?Smoking Status Every Day  ? Packs/day: 0.80  ? Years: 43.00  ? Pack years: 34.40  ? Types: Cigarettes  ?Smokeless Tobacco Never  ? ? ?  Social History  ? ?Socioeconomic History  ? Marital status: Unknown  ?Tobacco Use  ? Smoking status: Every Day  ?Packs/day: 0.80  ?Years: 43.00  ?Pack years: 34.40  ?Types: Cigarettes  ? Smokeless tobacco: Never  ?Vaping Use  ? Vaping Use: Never used  ?Substance and Sexual Activity  ? Alcohol use: Yes  ?Comment: "Beer, only on weekends"  ? Drug use: Never  ? ?Objective:  ?There were no vitals filed for this visit.  ?There is no height or weight on  file to calculate BMI. ? ?Physical Exam ?Vitals reviewed.  ?Constitutional:  ?General: She is not in acute distress. ?Appearance: Normal appearance.  ?HENT:  ?Head: Normocephalic and atraumatic.  ?Right Ear: External ear normal.  ?Left Ear: External ear normal.  ?Nose: Nose normal.  ?Mouth/Throat:  ?Mouth: Mucous membranes are moist.  ?Pharynx: Oropharynx is clear.  ?Eyes:  ?General: No scleral icterus. ?Extraocular Movements: Extraocular movements intact.  ?Conjunctiva/sclera: Conjunctivae normal.  ?Pupils: Pupils are equal, round, and reactive to light.  ?Cardiovascular:  ?Rate and Rhythm: Normal rate and regular rhythm.  ?Pulses: Normal pulses.  ?Heart sounds: Normal heart sounds.  ?Pulmonary:  ?Effort: Pulmonary effort is normal. No respiratory distress.  ?Breath sounds: Normal breath sounds.  ?Abdominal:  ?General: Bowel sounds are normal.  ?Palpations: Abdomen is soft.  ?Tenderness: There is no abdominal tenderness.  ?Musculoskeletal:  ?General: No swelling, tenderness or deformity. Normal range of motion.  ?Cervical back: Normal range of motion and neck supple.  ?Skin: ?General: Skin is warm and dry.  ?Coloration: Skin is not jaundiced.  ?Neurological:  ?General: No focal deficit present.  ?Mental Status: She is alert and oriented to person, place, and time.  ?Psychiatric:  ?Mood and Affect: Mood normal.  ?Behavior: Behavior normal.  ? ? ? ?Breast: There is a 1 cm palpable mobile mass in the upper outer quadrant of the right breast. There is no other palpable mass in either breast. There is no palpable axillary, supraclavicular, or cervical lymphadenopathy. ? ?Labs, Imaging and Diagnostic Testing: ? ?Assessment and Plan:  ? ?Diagnoses and all orders for this visit: ? ?Malignant neoplasm of upper-outer quadrant of right breast in female, estrogen receptor positive (CMS-HCC) ?- Ambulatory Referral to Oncology-Medical ?- Ambulatory Referral to Radiation Oncology ?- Ambulatory Referral to Physical Therapy ?- [facility]  Case Posting Request; Future ? ? ? ?The patient appears to have a 1.2 cm cancer in the upper outer quadrant of the right breast with clinically negative nodes. I had discussed with her in detail the different options for treatment and at this point she favors breast conservation which I feel is very reasonable. She is a good candidate for sentinel node mapping as well. I have discussed with her in detail the risks and benefits of the operation as well as some of the technical aspects and she understands and wishes to proceed. She will need both pulmonary and cardiology clearance prior to scheduling surgery. Although her cancer is lobular I do not think with her COPD she will be able to tolerate an MRI.  ?

## 2021-07-23 NOTE — Transfer of Care (Signed)
Immediate Anesthesia Transfer of Care Note ? ?Patient: Caroline Greer ? ?Procedure(s) Performed: RIGHT BREAST LUMPECTOMY WITH RADIOACTIVE SEED AND SENTINEL LYMPH NODE BIOPSY (Right: Breast) ? ?Patient Location: PACU ? ?Anesthesia Type:GA combined with regional for post-op pain ? ?Level of Consciousness: drowsy, patient cooperative and responds to stimulation ? ?Airway & Oxygen Therapy: Patient Spontanous Breathing and Patient connected to face mask oxygen ? ?Post-op Assessment: Report given to RN and Post -op Vital signs reviewed and stable ? ?Post vital signs: Reviewed and stable ? ?Last Vitals:  ?Vitals Value Taken Time  ?BP 120/72 07/23/21 0947  ?Temp    ?Pulse 67 07/23/21 0949  ?Resp 21 07/23/21 0949  ?SpO2 100 % 07/23/21 0949  ?Vitals shown include unvalidated device data. ? ?Last Pain:  ?Vitals:  ? 07/23/21 0739  ?TempSrc:   ?PainSc: 1   ?   ? ?Patients Stated Pain Goal: 0 (07/23/21 0739) ? ?Complications: No notable events documented. ?

## 2021-07-23 NOTE — Anesthesia Procedure Notes (Signed)
Procedure Name: LMA Insertion ?Date/Time: 07/23/2021 8:43 AM ?Performed by: Janace Litten, CRNA ?Pre-anesthesia Checklist: Patient identified, Emergency Drugs available, Suction available and Patient being monitored ?Patient Re-evaluated:Patient Re-evaluated prior to induction ?Oxygen Delivery Method: Circle System Utilized ?Preoxygenation: Pre-oxygenation with 100% oxygen ?Induction Type: IV induction ?Ventilation: Mask ventilation without difficulty ?LMA: LMA inserted ?LMA Size: 4.0 ?Number of attempts: 2 (Attempt x1 w/ LMA 3, unable to obtain good seal) ?Placement Confirmation: positive ETCO2 ?Tube secured with: Tape ?Dental Injury: Teeth and Oropharynx as per pre-operative assessment  ? ? ? ? ?
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
## 2021-07-23 NOTE — Op Note (Signed)
07/23/2021 ? ?9:44 AM ? ?PATIENT:  Caroline Greer  66 y.o. female ? ?PRE-OPERATIVE DIAGNOSIS:  RIGHT BREAST CANCER ? ?POST-OPERATIVE DIAGNOSIS:  RIGHT BREAST CANCER ? ?PROCEDURE:  Procedure(s): ?RIGHT BREAST LUMPECTOMY WITH RADIOACTIVE SEED LOCALIZATION AND DEEP RIGHT AXILLARY SENTINEL LYMPH NODE BIOPSY (Right) ? ?SURGEON:  Surgeon(s) and Role: ?   Jovita Kussmaul, MD - Primary ? ?PHYSICIAN ASSISTANT:  ? ?ASSISTANTS: none  ? ?ANESTHESIA:   local and general ? ?EBL:  20 mL  ? ?BLOOD ADMINISTERED:none ? ?DRAINS: none  ? ?LOCAL MEDICATIONS USED:  MARCAINE    ? ?SPECIMEN:  Source of Specimen:  right breast tissue and sentinel node ? ?DISPOSITION OF SPECIMEN:  PATHOLOGY ? ?COUNTS:  YES ? ?TOURNIQUET:  * No tourniquets in log * ? ?DICTATION: .Dragon Dictation ? ?After informed consent was obtained the patient was brought to the operating room and placed in the supine position on the operating table.  After adequate induction of general anesthesia the patient's right chest, breast, and axillary area were prepped with ChloraPrep, allowed to dry, and draped in usual sterile manner.  An appropriate timeout was performed.  At this point, 2 cc of iron oxide were injected in the subareolar plexus on the right and the breast was massaged for 5 minutes.  Also previously an I-125 seed was placed far in the upper outer quadrant of the right breast to mark an area of invasive breast cancer.  The neoprobe was set to I-125 in the area of radioactivity was readily identified.  This area was very close to the axilla.  Because of this I elected to do both parts of the operation through the 1 incision.  I made an elliptical incision in the skin overlying the area of radioactivity in the breast with a 15 blade knife.  The incision was carried through the skin and subcutaneous tissue sharply with the electrocautery.  Dissection was then carried widely around the radioactive seed while checking the area of radioactivity frequently.  This  dissection was carried all the way to the chest wall.  Once the specimen was removed it was oriented with the appropriate paint colors.  A specimen radiograph was obtained that showed the clip and seed to be near the center of the specimen.  The specimen was then sent to pathology for further evaluation.  The mag trace was then used to identify a signal in the right axilla.  The deep right axillary space was entered sharply with the electrocautery and dissection was directed by the mag trace.  I was able to identify a lymph node with signal.  This was excised sharply with the electrocautery and the surrounding small vessels and lymphatics were controlled with clips.  No other hot or palpable nodes were identified in the right axilla.  This was sent as sentinel node #1.  Hemostasis was achieved using the Bovie electrocautery.  The wound was irrigated with saline and infiltrated with quarter percent Marcaine.  The cavity of the lumpectomy was marked with clips.  The axilla was closed with interrupted 3-0 Vicryl stitches.  The lumpectomy cavity was also closed with interrupted 3-0 Vicryl stitches.  The skin was closed with a running 4-0 Monocryl subcuticular stitch.  Dermabond dressings were applied.  The patient tolerated the procedure well.  At the end of the case all needle sponge and instrument counts were correct.  The patient was then awakened and taken to recovery in stable condition. ? ?PLAN OF CARE: Discharge to home after PACU ? ?  PATIENT DISPOSITION:  PACU - hemodynamically stable. ?  ?Delay start of Pharmacological VTE agent (>24hrs) due to surgical blood loss or risk of bleeding: not applicable ? ?

## 2021-07-23 NOTE — Anesthesia Postprocedure Evaluation (Signed)
Anesthesia Post Note ? ?Patient: Caroline Greer ? ?Procedure(s) Performed: RIGHT BREAST LUMPECTOMY WITH RADIOACTIVE SEED AND SENTINEL LYMPH NODE BIOPSY (Right: Breast) ? ?  ? ?Patient location during evaluation: PACU ?Anesthesia Type: Regional and General ?Level of consciousness: awake and alert ?Pain management: pain level controlled ?Vital Signs Assessment: post-procedure vital signs reviewed and stable ?Respiratory status: spontaneous breathing, nonlabored ventilation, respiratory function stable and patient connected to nasal cannula oxygen ?Cardiovascular status: blood pressure returned to baseline and stable ?Postop Assessment: no apparent nausea or vomiting ?Anesthetic complications: no ? ? ?No notable events documented. ? ?Last Vitals:  ?Vitals:  ? 07/23/21 1015 07/23/21 1032  ?BP: 106/68 108/70  ?Pulse: 78 78  ?Resp: (!) 23 (!) 22  ?Temp:  36.6 ?C  ?SpO2: 90% 95%  ?  ?Last Pain:  ?Vitals:  ? 07/23/21 1032  ?TempSrc:   ?PainSc: 0-No pain  ? ? ?  ?  ?  ?  ?  ?  ? ?March Rummage Danyah Guastella ? ? ? ? ?

## 2021-07-23 NOTE — Interval H&P Note (Signed)
History and Physical Interval Note: ? ?07/23/2021 ?8:12 AM ? ?Caroline Greer  has presented today for surgery, with the diagnosis of RIGHT BREAST CANCER.  The various methods of treatment have been discussed with the patient and family. After consideration of risks, benefits and other options for treatment, the patient has consented to  Procedure(s): ?RIGHT BREAST LUMPECTOMY WITH RADIOACTIVE SEED AND SENTINEL LYMPH NODE BIOPSY (Right) as a surgical intervention.  The patient's history has been reviewed, patient examined, no change in status, stable for surgery.  I have reviewed the patient's chart and labs.  Questions were answered to the patient's satisfaction.   ? ? ?Autumn Messing III ? ? ?

## 2021-07-24 ENCOUNTER — Encounter (HOSPITAL_COMMUNITY): Payer: Self-pay | Admitting: General Surgery

## 2021-07-27 ENCOUNTER — Telehealth: Payer: Self-pay

## 2021-07-27 NOTE — Telephone Encounter (Signed)
Return call to pt, pt requesting information about what medicine she is supposed to take for 7-10 years.  I advised pt that she is scheduled to see Dr [name] on 3/13 and at that time they will discuss medications and what her treatment plan is moving forward.  Pt verbalized understanding and thanks  ?

## 2021-07-29 LAB — SURGICAL PATHOLOGY

## 2021-07-30 ENCOUNTER — Encounter: Payer: Self-pay | Admitting: *Deleted

## 2021-07-30 NOTE — Progress Notes (Signed)
? ?Patient Care Team: ?[name], MD as PCP - General (Internal Medicine) ?Sueanne Margarita, MD as PCP - Cardiology (Cardiology) ?Mauro Kaufmann, RN as Oncology Nurse Navigator ?Rockwell Germany, RN as Oncology Nurse Navigator ?Jovita Kussmaul, MD as Consulting Physician (General Surgery) ?Nicholas Lose, MD as Consulting Physician (Hematology and Oncology) ?Gery Pray, MD as Consulting Physician (Radiation Oncology) ? ?DIAGNOSIS:  ?Encounter Diagnosis  ?Name Primary?  ? Malignant neoplasm of upper-outer quadrant of right breast in female, estrogen receptor positive ([place])   ? ? ?SUMMARY OF ONCOLOGIC HISTORY: ?Oncology History  ?Malignant neoplasm of upper-outer quadrant of right breast in female, estrogen receptor positive ([place])  ?05/20/2021 Initial Diagnosis  ? Palpable mass in the right breast upper outer quadrant: 1.1 cm along with distortion.  Mammogram ultrasound revealed 1.2 cm mass at 11 o'clock position, axilla negative, biopsy revealed grade 2 invasive lobular cancer ER 90%, PR 100%, HER2 negative, Ki-67 5% ?  ?06/03/2021 Cancer Staging  ? Staging form: Breast, AJCC 8th Edition ?- Clinical stage from 06/03/2021: Stage IA (cT1b, cN0, cM0, G2, ER+, PR+, HER2-) - Signed by Nicholas Lose, MD on 06/03/2021 ?Stage prefix: Initial diagnosis ?Histologic grading system: 3 grade system ? ?  ?07/23/2021 Surgery  ? Right lumpectomy: Grade 2 invasive lobular cancer, LCIS, margins negative, 1/3 lymph nodes positive, ER 90%, PR 100%, HER2 negative, Ki-67 5% ?  ? ? ?CHIEF COMPLIANT: Follow-up after recent breast surgery ?  ?INTERVAL HISTORY: Chevie Birkhead is a  66 y.o. female is here because of recent diagnosis of right breast cancer.  Patient felt a palpable lump in the right breast measuring 1.1 cm on mammogram and by ultrasound made measures 1.2 cm.  Axilla was negative.  Biopsy of this mass revealed grade 2 invasive lobular cancer ER 90%, PR 100%, Ki-67 5%, HER2 negative.  She underwent right lumpectomy on 07/23/2021.   She presents to the clinic for follow-up to discuss final pathology report. ? ?ALLERGIES:  is allergic to crab [shellfish allergy] and erythromycin. ? ?MEDICATIONS:  ?Current Outpatient Medications  ?Medication Sig Dispense Refill  ? ALPRAZolam (XANAX) 0.5 MG tablet Take 0.5 mg by mouth at bedtime as needed for sleep.    ? amLODipine (NORVASC) 5 MG tablet Take 1 tablet (5 mg total) by mouth daily. 90 tablet 3  ? aspirin EC 81 MG tablet Take 81 mg by mouth daily.    ? atorvastatin (LIPITOR) 10 MG tablet Take 1 tablet (10 mg total) by mouth daily. 90 tablet 3  ? benazepril (LOTENSIN) 40 MG tablet Take 40 mg by mouth daily.    ? Budeson-Glycopyrrol-Formoterol (BREZTRI AEROSPHERE) 160-9-4.8 MCG/ACT AERO Inhale 2 puffs into the lungs in the morning and at bedtime. 32.1 g 1  ? fluticasone (FLONASE) 50 MCG/ACT nasal spray Place 2 sprays into both nostrils daily. If needed will use additional 2 spray at bedtime    ? HYDROcodone-acetaminophen (NORCO/VICODIN) 5-325 MG tablet Take 1 tablet by mouth every 8 (eight) hours as needed.    ? ipratropium-albuterol (DUONEB) 0.5-2.5 (3) MG/3ML SOLN Take 3 mLs by nebulization 4 (four) times daily as needed (shortness of breath).    ? metoprolol tartrate (LOPRESSOR) 25 MG tablet Take 1 tablet (25 mg total) by mouth 2 (two) times daily. 180 tablet 3  ? Omega-3 Fatty Acids (FISH OIL) 1000 MG CAPS Take 1,000 mg by mouth daily.    ? oxyCODONE (ROXICODONE) 5 MG immediate release tablet Take 1 tablet (5 mg total) by mouth every 6 (six) hours as  needed for severe pain. 10 tablet 0  ? OXYGEN Inhale 3 L into the lungs continuous.    ? polyethylene glycol (MIRALAX / GLYCOLAX) 17 g packet Take 17 g by mouth daily. (Patient taking differently: Take 17 g by mouth daily as needed for mild constipation or moderate constipation.) 14 each 0  ? PROAIR HFA 108 (90 Base) MCG/ACT inhaler Inhale 2 puffs into the lungs every 6 (six) hours as needed for wheezing or shortness of breath. 90 day supply 18 g 3  ?  vitamin B-12 (CYANOCOBALAMIN) 500 MCG tablet Take 500 mcg by mouth daily.    ? ?No current facility-administered medications for this visit.  ? ? ?PHYSICAL EXAMINATION: ?ECOG PERFORMANCE STATUS: 1 - Symptomatic but completely ambulatory ? ?Vitals:  ? 08/03/21 1459  ?BP: (!) 159/96  ?Pulse: (!) 102  ?Resp: 20  ?Temp: (!) 97.5 ?F (36.4 ?C)  ?SpO2: 92%  ? ?Filed Weights  ? 08/03/21 1459  ?Weight: 133 lb 3.2 oz (60.4 kg)  ? ? ? ?LABORATORY DATA:  ?I have reviewed the data as listed ?CMP Latest Ref Rng & Units 07/15/2021 06/03/2021 05/13/2020  ?Glucose 70 - 99 mg/dL 90 81 79  ?BUN 8 - 23 mg/dL <5(L) 9 6(L)  ?Creatinine 0.44 - 1.00 mg/dL 0.57 0.57 0.76  ?Sodium 135 - 145 mmol/L 136 138 139  ?Potassium 3.5 - 5.1 mmol/L 3.4(L) 3.8 4.0  ?Chloride 98 - 111 mmol/L 95(L) 98 97  ?CO2 22 - 32 mmol/L 30 33(H) 27  ?Calcium 8.9 - 10.3 mg/dL 9.4 9.8 9.3  ?Total Protein 6.5 - 8.1 g/dL - 7.6 7.1  ?Total Bilirubin 0.3 - 1.2 mg/dL - 0.5 0.4  ?Alkaline Phos 38 - 126 U/L - 48 72  ?AST 15 - 41 U/L - 15 23  ?ALT 0 - 44 U/L - 14 24  ? ? ?Lab Results  ?Component Value Date  ? WBC 12.1 (H) 07/15/2021  ? HGB 15.1 (H) 07/15/2021  ? HCT 45.8 07/15/2021  ? MCV 101.3 (H) 07/15/2021  ? PLT 304 07/15/2021  ? NEUTROABS 7.8 (H) 06/03/2021  ? ? ?ASSESSMENT & PLAN:  ?Malignant neoplasm of upper-outer quadrant of right breast in female, estrogen receptor positive ([place]) ?07/23/2021:Right lumpectomy: Grade 2 invasive lobular cancer, LCIS, margins negative, 1/3 lymph nodes positive, ER 90%, PR 100%, HER2 negative, Ki-67 5% ? ?Pathology counseling: I discussed the final pathology report of the patient provided  a copy of this report. I discussed the margins as well as lymph node surgeries. We also discussed the final staging along with previously performed ER/PR and HER-2/neu testing. ? ?Treatment plan: ?1.  Molecular testing will not be done because of severe COPD and performance status issues ?2.  Adjuvant radiation therapy ?3.  Adjuvant antiestrogen  therapy ? ?Return to clinic after radiation is complete. ? ?No orders of the defined types were placed in this encounter. ? ?The patient has a good understanding of the overall plan. she agrees with it. she will call with any problems that may develop before the next visit here. ?Total time spent: 30 mins including face to face time and time spent for planning, charting and co-ordination of care ? ? Harriette Ohara, MD ?08/03/21 ?I, Gardiner Coins, am acting as a Education administrator for Dr [name]  ? ?  ?

## 2021-07-31 ENCOUNTER — Encounter: Payer: Self-pay | Admitting: *Deleted

## 2021-07-31 ENCOUNTER — Telehealth: Payer: Self-pay | Admitting: *Deleted

## 2021-07-31 ENCOUNTER — Encounter: Payer: Self-pay | Admitting: General Practice

## 2021-07-31 NOTE — Progress Notes (Signed)
[facility] Spiritual Care Note ? ?Followed up with Ms Caroline Greer by phone. She reports that she is relieved to be healing from surgery with only an occasional need for tylenol and is in good spirits overall. Provided empathic listening, encouragement, and reminder of [facility] support programming. We plan to follow up by phone in ca two weeks. ? ? ?Chaplain Lorrin Jackson, MDiv, [facility] ?Pager [phone] ?Voicemail [phone]  ?

## 2021-07-31 NOTE — Telephone Encounter (Signed)
Ordered mammaprint per Dr. [name]. Faxed requisition to pathology and [facility] ?

## 2021-08-03 ENCOUNTER — Encounter: Payer: Self-pay | Admitting: *Deleted

## 2021-08-03 ENCOUNTER — Inpatient Hospital Stay: Payer: Medicare Other | Attending: Hematology and Oncology | Admitting: Hematology and Oncology

## 2021-08-03 ENCOUNTER — Telehealth: Payer: Self-pay | Admitting: *Deleted

## 2021-08-03 ENCOUNTER — Telehealth: Payer: Self-pay | Admitting: Radiation Oncology

## 2021-08-03 ENCOUNTER — Other Ambulatory Visit: Payer: Self-pay

## 2021-08-03 DIAGNOSIS — Z923 Personal history of irradiation: Secondary | ICD-10-CM | POA: Insufficient documentation

## 2021-08-03 DIAGNOSIS — Z170 Estrogen receptor positive status [ER+]: Secondary | ICD-10-CM | POA: Diagnosis not present

## 2021-08-03 DIAGNOSIS — Z7982 Long term (current) use of aspirin: Secondary | ICD-10-CM | POA: Diagnosis not present

## 2021-08-03 DIAGNOSIS — C50411 Malignant neoplasm of upper-outer quadrant of right female breast: Secondary | ICD-10-CM | POA: Diagnosis present

## 2021-08-03 DIAGNOSIS — Z79899 Other long term (current) drug therapy: Secondary | ICD-10-CM | POA: Insufficient documentation

## 2021-08-03 NOTE — Assessment & Plan Note (Signed)
07/23/2021:Right lumpectomy: Grade 2 invasive lobular cancer, LCIS, margins negative, 1/3 lymph nodes positive, ER 90%, PR 100%, HER2 negative, Ki-67 5% ? ?Pathology counseling: I discussed the final pathology report of the patient provided  a copy of this report. I discussed the margins as well as lymph node surgeries. We also discussed the final staging along with previously performed ER/PR and HER-2/neu testing. ? ?Treatment plan: ?1.  Molecular testing will not be done because of severe COPD and performance status issues ?2. adjuvant radiation therapy ?3.  Adjuvant antiestrogen therapy ? ?Return to clinic after radiation is complete. ? ?

## 2021-08-03 NOTE — Telephone Encounter (Signed)
Called patient to schedule a consultation w. Sondra Come. Unable to LVM, VM not setup.  ?

## 2021-08-03 NOTE — Telephone Encounter (Signed)
Request sent to [facility] mamm[facility]rint. ?

## 2021-08-05 ENCOUNTER — Other Ambulatory Visit: Payer: Self-pay | Admitting: Radiation Oncology

## 2021-08-05 ENCOUNTER — Ambulatory Visit
Admission: RE | Admit: 2021-08-05 | Discharge: 2021-08-05 | Disposition: A | Discharge status: Home / Self Care | Payer: Self-pay | Source: Ambulatory Visit | Attending: Radiation Oncology | Admitting: Radiation Oncology

## 2021-08-05 ENCOUNTER — Inpatient Hospital Stay
Admission: RE | Admit: 2021-08-05 | Discharge: 2021-08-05 | Disposition: A | Discharge status: Home / Self Care | Payer: Self-pay | Source: Ambulatory Visit | Attending: Radiation Oncology | Admitting: Radiation Oncology

## 2021-08-05 DIAGNOSIS — C50411 Malignant neoplasm of upper-outer quadrant of right female breast: Secondary | ICD-10-CM

## 2021-08-06 ENCOUNTER — Encounter: Payer: Self-pay | Admitting: *Deleted

## 2021-08-06 ENCOUNTER — Encounter (HOSPITAL_COMMUNITY): Payer: Self-pay

## 2021-08-12 ENCOUNTER — Encounter: Payer: Self-pay | Admitting: Hematology and Oncology

## 2021-08-14 ENCOUNTER — Encounter: Payer: Self-pay | Admitting: General Practice

## 2021-08-14 NOTE — Progress Notes (Signed)
[facility] Spiritual Care Note ? ?Followed up with Ms Caroline Greer by phone. She is feeling disappointed about the slow pace of healing and the need to do radiation, but spring is bringing encouragement, and she looks forward to being able to go fishing again because she loves being in the peace of nature. ? ?She is aware of ongoing chaplain availability and plans to reach out as needed/desired, including potentially scheduling a visit for when she will already be on campus for radiation. ? ? ?Chaplain Lorrin Jackson, MDiv, [facility] ?Pager [phone] ?Voicemail [phone]  ?

## 2021-08-17 ENCOUNTER — Ambulatory Visit: Payer: Medicare Other | Attending: General Surgery | Admitting: Physical Therapy

## 2021-08-17 ENCOUNTER — Encounter: Payer: Self-pay | Admitting: Physical Therapy

## 2021-08-17 ENCOUNTER — Other Ambulatory Visit: Payer: Self-pay

## 2021-08-17 DIAGNOSIS — C50411 Malignant neoplasm of upper-outer quadrant of right female breast: Secondary | ICD-10-CM | POA: Diagnosis present

## 2021-08-17 DIAGNOSIS — Z483 Aftercare following surgery for neoplasm: Secondary | ICD-10-CM | POA: Diagnosis present

## 2021-08-17 DIAGNOSIS — Z170 Estrogen receptor positive status [ER+]: Secondary | ICD-10-CM | POA: Diagnosis present

## 2021-08-17 DIAGNOSIS — R293 Abnormal posture: Secondary | ICD-10-CM | POA: Diagnosis present

## 2021-08-17 NOTE — Therapy (Signed)
?OUTPATIENT PHYSICAL THERAPY BREAST CANCER POST OP FOLLOW UP ? ? ?Patient Name: Caroline Greer ?MRN: [id number] ?DOB:14-Sep-1955, 66 y.o., female ?Today's Date: 08/17/2021 ? ? PT End of Session - 08/17/21 1120   ? ? Visit Number 2   ? Number of Visits 10   ? Date for PT Re-Evaluation 09/14/21   ? PT Start Time 1118   ? PT Stop Time 1200   ? PT Time Calculation (min) 42 min   ? Activity Tolerance Patient tolerated treatment well   ? Behavior During Therapy [facility] for tasks assessed/performed   ? ?  ?  ? ?  ? ? ?Past Medical History:  ?Diagnosis Date  ? Acute respiratory failure with hypoxia ([place]) 09/30/2018  ? Constipation   ? COPD exacerbation ([place]) 01/05/2019  ? Depression with anxiety 09/30/2018  ? Dyspnea   ? Essential hypertension 09/30/2018  ? Hypertension   ? Tobacco dependence due to cigarettes 09/30/2018  ? ?Past Surgical History:  ?Procedure Laterality Date  ? BREAST LUMPECTOMY WITH RADIOACTIVE SEED AND SENTINEL LYMPH NODE BIOPSY Right 07/23/2021  ? Procedure: RIGHT BREAST LUMPECTOMY WITH RADIOACTIVE SEED AND SENTINEL LYMPH NODE BIOPSY;  Surgeon: Caroline Kussmaul, MD;  Location: [facility];  Service: General;  Laterality: Right;  ? HAND SURGERY Right 1997  ? Hand Fracture (right thumb & hand)  ? LAPAROTOMY  1974  ? RIGHT/LEFT HEART CATH AND CORONARY ANGIOGRAPHY N/A 10/02/2019  ? Procedure: RIGHT/LEFT HEART CATH AND CORONARY ANGIOGRAPHY;  Surgeon: Sherren Mocha, MD;  Location: [facility];  Service: Cardiovascular;  Laterality: N/A;  ? ?Patient Active Problem List  ? Diagnosis Date Noted  ? Genetic testing 06/03/2021  ? Malignant neoplasm of upper-outer quadrant of right breast in female, estrogen receptor positive ([place]) 06/01/2021  ? Abnormal cardiac CT angiography 10/02/2019  ? Constipation   ? COPD (chronic obstructive pulmonary disease) ([place]) 01/05/2019  ? Encounter for screening for HIV 01/05/2019  ? COPD exacerbation ([place]) 01/05/2019  ? Acute respiratory failure with hypoxia ([place]) 09/30/2018  ? Essential  hypertension 09/30/2018  ? Tobacco dependence due to cigarettes 09/30/2018  ? Depression with anxiety 09/30/2018  ? ? ?PCP: [name], MD ? ?REFERRING PROVIDER: Jovita Kussmaul, MD ? ?REFERRING DIAG: s/p right lumpectomy ? ?THERAPY DIAG:  ?Malignant neoplasm of upper-outer quadrant of right breast in female, estrogen receptor positive ([name]) ? ?Abnormal posture ? ?Aftercare following surgery for neoplasm ? ?ONSET DATE: 07/23/2021 ? ?SUBJECTIVE:                                                                                                                                                                                          ? ?  SUBJECTIVE STATEMENT: ?Patient reports she underwent a right lumpectomy and sentinel node biopsy (1 of 3 nodes positive) on 07/23/2021. She will not undergo testing to determine need for chemotherapy due to performance status but will undergo radiation and anti-estrogen therapy. ? ?PERTINENT HISTORY:  ?Patient was diagnosed on 05/04/2021 with right grade II invasive lobular carcinoma breast cancer. She underwent a right lumpectomy and sentinel node biopsy (1 of 3 nodes positive) on 07/23/2021. It is ER/PR positive and HER2 negative with a Ki67 of 5%. She is on 3L of oxygen and has COPD. ? ?PATIENT GOALS:  Reassess how my recovery is going related to arm function, pain, and swelling. ? ?PAIN:  ?Are you having pain? Yes: NPRS scale: 3/10 ?Pain location: Right axilla ?Pain description: heaviness "feels like rocks" ?Aggravating factors: Nothing - feel swollen ?Relieving factors: Nothing ? ?PRECAUTIONS: Recent Surgery, right UE Lymphedema risk, Other: COPD, on 3L oxygen ? ?ACTIVITY LEVEL / LEISURE: She does some chair exercises ? ? ?OBJECTIVE:  ? ?PATIENT SURVEYS:  ?QUICK DASH: ? Caroline Greer - 08/17/21 0001   ? ? Open a tight or new jar Moderate difficulty   ? Do heavy household chores (wash walls, wash floors) Moderate difficulty   ? Carry a shopping bag or briefcase Moderate difficulty   ? Wash your  back Moderate difficulty   ? Use a knife to cut food Mild difficulty   ? Recreational activities in which you take some force or impact through your arm, shoulder, or hand (golf, hammering, tennis) Unable   ? During the past week, to what extent has your arm, shoulder or hand problem interfered with your normal social activities with family, friends, neighbors, or groups? Modererately   ? During the past week, to what extent has your arm, shoulder or hand problem limited your work or other regular daily activities Modererately   ? Arm, shoulder, or hand pain. Moderate   ? Tingling (pins and needles) in your arm, shoulder, or hand Moderate   ? Difficulty Sleeping Mild difficulty   ? DASH Score 50 %   ? ?  ?  ? ?  ? ? ? ?OBSERVATIONS: ?Right breast incision is healing well. No redness present. There is edema present with what is likely a moderate sized seroma near her axilla. Significant axillary cording present that extends into her anterior forearm (see photos taken). Right lateral breast appears to have some cording as well. ? ?POSTURE:  ?Forward head, rounded shoulders, increased thoracic kyphosis ? ?LYMPHEDEMA ASSESSMENT:  ? ?A/PROM Right ?06/03/2021 Left ?06/03/2021 Right  ?08/17/2021  ?Shoulder extension 35 55 54  ?Shoulder flexion 145 142 97  ?Shoulder abduction 152 166 105  ?Shoulder internal rotation 72 69 70  ?Shoulder external rotation 80 81 84  ?                        (Blank rows = not tested) ?  ?   ?UPPER EXTREMITY STRENGTH: Not tested ?  ?  ?LYMPHEDEMA ASSESSMENTS:  ?  ?LANDMARK RIGHT ?06/03/2021 LEFT ?06/03/2021 RIGHT ?08/17/2021 LEFT ?08/17/2021  ?10 cm proximal to olecranon process 22 21.5 21.8 21.4  ?Olecranon process 22.4 21.4 22.3 21.2  ?10 cm proximal to ulnar styloid process 18.7 17.9 17.8 17.1  ?Just proximal to ulnar styloid process 14.7 14.5 14.3 14.2  ?Across hand at thumb web space 18.5 18.2 18.5 17.5  ?At base of 2nd digit 5.9 6.9 5.9 5.8  ?(Blank rows = not tested) ?  ?  ? ? ?  ?  Surgery  type/Date: 07/23/2021 ?Number of lymph nodes removed: 2 ?Current/past treatment (chemo, radiation, hormone therapy): none ?Other symptoms:  ?Heaviness/tightness Yes ?Pain Yes ?Pitting edema No ?Infections No ?Decreased scar mobility Yes ?Stemmer sign No ? ? ?PATIENT EDUCATION:  ?Education details: HEP, lymphedema education ?Person educated: Patient ?Education method: Explanation, Demonstration, and Handouts ?Education comprehension: verbalized understanding ? ? ?HOME EXERCISE PROGRAM: ? Reviewed previously given post op HEP. ? ? ?ASSESSMENT: ? ?CLINICAL IMPRESSION: ?Patient is healing well s/p right lumpectomy and sentinel node biopsy on 07/23/2021. She had 3 axillary nodes removed and 1 was positive. She will undergo radiation and anti-estrogen therapy. She has what appears to be a seroma at her incision site and significant axillary cording extending into her forearm. She will benefit from PT to regain shoulder ROM and function. ? ?Pt will benefit from skilled therapeutic intervention to improve on the following deficits: Decreased knowledge of precautions, impaired UE functional use, pain, decreased ROM, postural dysfunction.  ? ?PT treatment/interventions: ADL/Self care home management, Therapeutic exercises, Therapeutic activity, Neuromuscular re-education, Balance training, Gait training, Patient/Family education, and Joint mobilization ? ? ? ? ?GOALS: ?Goals reviewed with patient? Yes ? ?LONG TERM GOALS:  (STG=LTG) ? ?GOALS Name Target Date Goal status  ?1 Pt will demonstrate she has regained full shoulder ROM and function post operatively compared to baselines.  ?Baseline: 09/14/2021 IN PROGRESS  ?2 Patient will report she is able to return to fishing. 09/14/2021 NEW  ?3 Patient will demonstrate she has regained right shoulder flexion to be >/= 140 degrees for increased ease reaching. 09/14/2021 NEW  ?4 Patient will demonstrate she has regained right shoulder abduction to be >/= 140 degrees for increased ease  reaching. 09/14/2021 NEW  ?5 Patient will improve her DASH score to be </= 8 for improved overall UE function. 09/14/2021 NEW  ? ? ? ?PLAN: ?PT FREQUENCY/DURATION: 2x/week for 4 weeks ? ?PLAN FOR NEXT SESSION: Begin P

## 2021-08-17 NOTE — Patient Instructions (Addendum)
[facility] ? [address], [address] ? [place] [address] ? [phone] ? ?After Breast Cancer Class ?It is recommended you attend the ABC class to be educated on lymphedema risk reduction. This class is free of charge and lasts for 1 hour. It is a 1-time class. You will need to download the Webex app either on your phone or computer. We will send you a link the night before or the morning of the class. You should be able to click on that link to join the class. This is not a confidential class. You don't have to turn your camera on, but other participants may be able to see your email address. I am giving you the information instead since you don't want to do a virtual class. ? ?Scar massage ?You can begin gentle scar massage to you incision sites. Gently place one hand on the incision and move the skin (without sliding on the skin) in various directions. Do this for a few minutes and then you can gently massage either coconut oil or vitamin E cream into the scars. ? ?Compression garment ?You should continue wearing your compression bra until you feel like you no longer have swelling. ? ?Home exercise Program ?Continue doing the exercises you were given until you feel like you can do them without feeling any tightness at the end.  ? ?Walking Program ?Studies show that 30 minutes of walking per day (fast enough to elevate your heart rate) can significantly reduce the risk of a cancer recurrence. If you can't walk due to other medical reasons, we encourage you to find another activity you could do (like a stationary bike or water exercise). ? ?Posture ?After breast cancer surgery, people frequently sit with rounded shoulders posture because it puts their incisions on slack and feels better. If you sit like this and scar tissue forms in that position, you can become very tight and have pain sitting or standing with good posture. Try to be aware of your posture and sit and stand up tall to  heal properly. ? ?Follow up PT: ?We will see you twice a week for 4 weeks to restore normal function and range of motion in your shoulder. ? ?Axillary web syndrome (also called cording) can happen after having breast cancer surgery when lymph nodes in the armpit are removed. It presents as if you have a thin cord in your arm and can run from the armpit all the way down into the forearm. If you?ve had a sentinel node biopsy, the risk is 1-20% and if you?ve had an axillary lymph node dissection (more than 7 nodes removed), the risk is 36-72%. The ranges vary depending on the research study.  ?It most often happens 3-4 weeks post-op but can happen sooner or later. There are several possibilities for what cording actually is. Although no one knows for sure as of yet, it may be related to lymphatics, veins, or other tissue. ?Sometimes cording resolves on its own but other times it requires physical therapy with a therapist who specializes in lymphedema and/or cancer rehab. Treatment typically involves stretching, manual techniques, and exercise. Sometimes cords get ?released? while stretching or during manual treatment and the patient may experience the sensation of a ?pop.? This may feel strange but it is not dangerous and is a sign that the cord has released; range of motion may be improved in the process. ? ?

## 2021-08-17 NOTE — Progress Notes (Signed)
Location of Breast Cancer: upper-outer quadrant of right breast  ? ?Histology per Pathology Report: grade 2 invasive lobular cancer  ? ?Receptor Status: ER 90%, PR 100%, HER2 negative, Ki-67 5% ? ?Did patient present with symptoms (if so, please note symptoms) or was this found on screening mammography?: Palpable mass in the right breast upper outer quadrant: 1.1 cm along with distortion. ? ?Past/Anticipated interventions by surgeon, if any:  ?Procedure(s): ?RIGHT BREAST LUMPECTOMY WITH RADIOACTIVE SEED LOCALIZATION AND DEEP RIGHT AXILLARY SENTINEL LYMPH NODE BIOPSY (Right) ?  ?SURGEON: ?   Jovita Kussmaul, MD ? ?Past/Anticipated interventions by medical oncology, if any: Dr [name] ?Treatment plan: ?1.  Molecular testing will not be done because of severe COPD and performance status issues ?2.  Adjuvant radiation therapy ?3.  Adjuvant antiestrogen therapy ? ?Lymphedema issues, if any:  yes right  ? ?Pain issues, if any:  yes, 3/10 intermittent and aching pain to right axilla ? ?SAFETY ISSUES: ?Prior radiation? no ?Pacemaker/ICD? no ?Possible current pregnancy?no, postmenopausal ?Is the patient on methotrexate? no ? ?Current Complaints / other details:  decreased range of motion of right arm ?   ?Vitals:  ? 08/24/21 1234  ?BP: 139/90  ?Pulse: 86  ?Resp: (!) 22  ?Temp: (!) 96.3 ?F (35.7 ?C)  ?TempSrc: Temporal  ?SpO2: 90%  ?Weight: 131 lb 2 oz (59.5 kg)  ?Height: _0  (1.778 m)  ? ? ? ? ?

## 2021-08-23 NOTE — Progress Notes (Signed)
?Radiation Oncology         [phone] ?________________________________ ? ?Name: Caroline Greer MRN: [id number]  ?Date: 08/24/2021  DOB: December 18, 1955 ? ?Re-Evaluation Note ? ?CC: [name], MD  Nicholas Lose, MD ? ?  ICD-10-CM   ?1. Malignant neoplasm of upper-outer quadrant of right breast in female, estrogen receptor positive ([facility])  C50.411   ? Z17.0   ?  ? ? ?Diagnosis:  S/p right lumpectomy:  Stage IA (pT1c, pN1, cM0) Right Breast UOQ, Invasive and in-situ Lobular Carcinoma, ER+ / PR+ / Her2-, Grade 2 (metastatic carcinoma to 1/3 right axillary sentinel lymph nodes) ? ?Narrative:  The patient returns today to discuss radiation treatment options. She was seen in the multidisciplinary breast clinic on 06/03/21.  ? ?She opted to proceed with right breast lumpectomy with SLN biopsies on 07/23/21 under the care of Dr. Marlou Starks. Pathology from the procedure revealed: grade 2 invasive lobular carcinoma measuring 1.5 cm, with lobular carcinoma in-situ and calcifications. All margins negative (margin status to invasive disease of <0.1 cm from the inferior margin). Nodal status of 1/3 right axillary SLN excisions positive for metastatic carcinoma, with focal extranodal extension present. Prognostic indicators significant for: estrogen receptor 90% positive; progesterone receptor 100% positive (both with strong staining intensity); Proliferation marker Ki67 at 5%; Her2 status negative; Grade 2.  ? ?Per her most recent follow-up with Dr. Marlou Starks on 08/19/21, the patient reported some soreness in the right breast but denied any other post-op issues. Physical exam performed during this visit revealed no signs of infection or seroma. The patient also met with OP rehab on 08/17/21 for evaluation of right axillary pain. During this visit, the patient described her pain as heavy and swollen feeling. Examination performed during rehab evaluation revealed what appeared to be a seroma at her incision site and significant axillary  cording extending into her forearm. Per encounter notes, it seems that the patient will benefit from PT to regain shoulder ROM and function. ? ?Of note: MammaPrint testing was ordered but was canceled due to severe COPD (on 3L O2) and performance status issues. That being said, the patient will not undergo any testing to determine the need for chemotherapy given these issues.  ? ?The patient will return to Dr. [name] following RT to discuss adjuvant antiestrogen treatment options.  ? ?On review of systems, the patient reports ongoing problems with cording.  She has been seen by physical therapy and will initiate therapy soon.. She denies significant cough or changes in her breathing.  She denies any significant pain in the right breast region.  And any other symptoms.  ? ?Allergies:  is allergic to crab [shellfish allergy] and erythromycin. ? ?Meds: ?Current Outpatient Medications  ?Medication Sig Dispense Refill  ? ALPRAZolam (XANAX) 0.5 MG tablet Take 0.5 mg by mouth at bedtime as needed for sleep.    ? amLODipine (NORVASC) 5 MG tablet Take 1 tablet (5 mg total) by mouth daily. 90 tablet 3  ? aspirin EC 81 MG tablet Take 81 mg by mouth daily.    ? atorvastatin (LIPITOR) 10 MG tablet Take 1 tablet (10 mg total) by mouth daily. 90 tablet 3  ? Budeson-Glycopyrrol-Formoterol (BREZTRI AEROSPHERE) 160-9-4.8 MCG/ACT AERO Inhale 2 puffs into the lungs in the morning and at bedtime. 32.1 g 1  ? fluticasone (FLONASE) 50 MCG/ACT nasal spray Place 2 sprays into both nostrils daily. If needed will use additional 2 spray at bedtime    ? ipratropium-albuterol (DUONEB) 0.5-2.5 (3) MG/3ML SOLN Take 3 mLs  by nebulization 4 (four) times daily as needed (shortness of breath).    ? metoprolol tartrate (LOPRESSOR) 25 MG tablet Take 1 tablet (25 mg total) by mouth 2 (two) times daily. 180 tablet 3  ? Omega-3 Fatty Acids (FISH OIL) 1000 MG CAPS Take 1,000 mg by mouth daily.    ? OXYGEN Inhale 3 L into the lungs continuous.    ?  polyethylene glycol (MIRALAX / GLYCOLAX) 17 g packet Take 17 g by mouth daily. (Patient taking differently: Take 17 g by mouth daily as needed for mild constipation or moderate constipation.) 14 each 0  ? PROAIR HFA 108 (90 Base) MCG/ACT inhaler Inhale 2 puffs into the lungs every 6 (six) hours as needed for wheezing or shortness of breath. 90 day supply 18 g 3  ? telmisartan (MICARDIS) 80 MG tablet Take 80 mg by mouth daily.    ? vitamin B-12 (CYANOCOBALAMIN) 500 MCG tablet Take 500 mcg by mouth daily.    ? ?No current facility-administered medications for this encounter.  ? ? ?Physical Findings: ?The patient is in no acute distress. Patient is alert and oriented.  Accompanied by daughter.  She is on 3 L of oxygen which is her baseline. ? height is 5' 10"  (1.778 m) and weight is 131 lb 2 oz (59.5 kg). Her temporal temperature is 96.3 ?F (35.7 ?C) (abnormal). Her blood pressure is 139/90 and her pulse is 86. Her respiration is 22 (abnormal) and oxygen saturation is 95%.   Lungs are clear to auscultation bilaterally. Heart has regular rate and rhythm. No palpable cervical, supraclavicular, or axillary adenopathy. Abdomen soft, non-tender, normal bowel sounds. ?Left Breast: no palpable mass, nipple discharge or bleeding. ?Right Breast: Well-healing scar in the upper outer quadrant.  Some some induration/swelling superior to the scar consistent with a small seroma.  No signs of drainage or infection within the breast area. ? ?Lab Findings: ?Lab Results  ?Component Value Date  ? WBC 12.1 (H) 07/15/2021  ? HGB 15.1 (H) 07/15/2021  ? HCT 45.8 07/15/2021  ? MCV 101.3 (H) 07/15/2021  ? PLT 304 07/15/2021  ? ? ?Radiographic Findings: ?No results found. ? ?Impression:  S/p right lumpectomy:  Stage IA (pT1c, pN1, cM0) Right Breast UOQ, Invasive and in-situ Lobular Carcinoma, ER+ / PR+ / Her2-, Grade 2 (metastatic carcinoma to 1/3 right axillary sentinel lymph nodes) ? ?Patient would be a good candidate for adjuvant radiation  therapy.  Given the metastatic spread to the axillary area I would recommend coverage of the axilla with her breast radiation treatments.  I discussed the general course of treatment side effects and potential toxicities of radiation therapy in this situation with the patient and her daughter.  She appears to understand and wishes to proceed with planned course of treatment. ? ?Shw understands that her breathing could worsen after her radiation therapy but we will limit the volume of lung is much as possible.  I discussed the option of not proceeding with radiation therapy and continuing onto adjuvant hormonal therapy but the patient does wish to be comprehensive in her adjuvant treatment plan. ? ?Plan:  Patient is scheduled for CT simulation later today.  Treatments to begin in approximately a week.  Anticipate 6-1/2 weeks of radiation therapy.  If the patient's developed significant fatigue and has difficulty completing her treatment she understands that she could discontinue her radiation therapy at any time. ? ?----------------------------------- ? ?Blair Promise, PhD, MD ? ?This document serves as a record of services personally performed  by Gery Pray, MD. It was created on his behalf by Roney Mans, a trained medical scribe. The creation of this record is based on the scribe's personal observations and the provider's statements to them. This document has been checked and approved by the attending provider. ? ?

## 2021-08-24 ENCOUNTER — Encounter: Payer: Self-pay | Admitting: Radiation Oncology

## 2021-08-24 ENCOUNTER — Ambulatory Visit
Admission: RE | Admit: 2021-08-24 | Discharge: 2021-08-24 | Disposition: A | Discharge status: Home / Self Care | Payer: Medicare Other | Source: Ambulatory Visit | Attending: Radiation Oncology | Admitting: Radiation Oncology

## 2021-08-24 VITALS — BP 139/90 | HR 86 | Temp 96.3°F | Resp 22 | Ht 70.0 in | Wt 131.1 lb

## 2021-08-24 DIAGNOSIS — Z9981 Dependence on supplemental oxygen: Secondary | ICD-10-CM | POA: Diagnosis not present

## 2021-08-24 DIAGNOSIS — Z79899 Other long term (current) drug therapy: Secondary | ICD-10-CM | POA: Diagnosis not present

## 2021-08-24 DIAGNOSIS — Z510 Encounter for antineoplastic radiation therapy: Secondary | ICD-10-CM | POA: Diagnosis present

## 2021-08-24 DIAGNOSIS — M79621 Pain in right upper arm: Secondary | ICD-10-CM | POA: Diagnosis not present

## 2021-08-24 DIAGNOSIS — Z170 Estrogen receptor positive status [ER+]: Secondary | ICD-10-CM | POA: Diagnosis not present

## 2021-08-24 DIAGNOSIS — C50411 Malignant neoplasm of upper-outer quadrant of right female breast: Secondary | ICD-10-CM | POA: Insufficient documentation

## 2021-08-24 DIAGNOSIS — J449 Chronic obstructive pulmonary disease, unspecified: Secondary | ICD-10-CM | POA: Diagnosis not present

## 2021-08-24 DIAGNOSIS — Z7982 Long term (current) use of aspirin: Secondary | ICD-10-CM | POA: Diagnosis not present

## 2021-08-24 HISTORY — DX: Malignant neoplasm of unspecified site of unspecified female breast: C50.919

## 2021-08-24 NOTE — Progress Notes (Signed)
See MD note for nursing evaluation. °

## 2021-08-26 ENCOUNTER — Ambulatory Visit: Payer: Medicare Other | Attending: General Surgery

## 2021-08-26 DIAGNOSIS — R293 Abnormal posture: Secondary | ICD-10-CM | POA: Insufficient documentation

## 2021-08-26 DIAGNOSIS — Z170 Estrogen receptor positive status [ER+]: Secondary | ICD-10-CM | POA: Diagnosis present

## 2021-08-26 DIAGNOSIS — C50411 Malignant neoplasm of upper-outer quadrant of right female breast: Secondary | ICD-10-CM | POA: Diagnosis present

## 2021-08-26 DIAGNOSIS — Z483 Aftercare following surgery for neoplasm: Secondary | ICD-10-CM | POA: Diagnosis present

## 2021-08-26 NOTE — Therapy (Signed)
?OUTPATIENT PHYSICAL THERAPY TREATMENT NOTE ? ? ?Patient Name: Caroline Greer ?MRN: [id number] ?DOB:12/10/1955, 66 y.o., female ?Today's Date: 08/26/2021 ? ?PCP: Moreira, Roy, MD ?REFERRING PROVIDER: Toth,Paul III,MD ? ?END OF SESSION:  ? PT End of Session - 08/26/21 1257   ? ? Visit Number 3   ? Number of Visits 10   ? Date for PT Re-Evaluation 09/14/21   ? PT Start Time 1300   ? PT Stop Time 1343   ? PT Time Calculation (min) 43 min   ? Activity Tolerance Patient tolerated treatment well   ? Behavior During Therapy WFL for tasks assessed/performed   ? ?  ?  ? ?  ? ? ?Past Medical History:  ?Diagnosis Date  ? Acute respiratory failure with hypoxia (HCC) 09/30/2018  ? Breast cancer ([facility]CC)   ? Constipation   ? COPD exacerbation ([facility]CC) 01/05/2019  ? Depression with anxiety 09/30/2018  ? Dyspnea   ? Essential hypertension 09/30/2018  ? Hypertension   ? Tobacco dependence due to cigarettes 09/30/2018  ? ?Past Surgical History:  ?Procedure Laterality Date  ? BREAST LUMPECTOMY WITH RADIOACTIVE SEED AND SENTINEL LYMPH NODE BIOPSY Right 07/23/2021  ? Procedure: RIGHT BREAST LUMPECTOMY WITH RADIOACTIVE SEED AND SENTINEL LYMPH NODE BIOPSY;  Surgeon: Toth, Paul III, MD;  Location: [facility] OR;  Service: General;  Laterality: Right;  ? HAND SURGERY Right 1997  ? Hand Fracture (right thumb & hand)  ? LAPAROTOMY  1974  ? RIGHT/LEFT HEART CATH AND CORONARY ANGIOGRAPHY N/A 10/02/2019  ? Procedure: RIGHT/LEFT HEART CATH AND CORONARY ANGIOGRAPHY;  Surgeon: Cooper, Michael, MD;  Location: [facility] INVASIVE CV LAB;  Service: Cardiovascular;  Laterality: N/A;  ? ?Patient Active Problem List  ? Diagnosis Date Noted  ? Genetic testing 06/03/2021  ? Malignant neoplasm of upper-outer quadrant of right breast in female, estrogen receptor positive (HCC) 06/01/2021  ? Abnormal cardiac CT angiography 10/02/2019  ? Constipation   ? COPD (chronic obstructive pulmonary disease) (HCC) 01/05/2019  ? Encounter for screening for HIV 01/05/2019  ? COPD exacerbation (HCC)  01/05/2019  ? Acute respiratory failure with hypoxia (HCC) 09/30/2018  ? Essential hypertension 09/30/2018  ? Tobacco dependence due to cigarettes 09/30/2018  ? Depression with anxiety 09/30/2018  ? ? ?REFERRING DIAG: s/p Right Lumpectomy with SLNB ? ?THERAPY DIAG:  ?Malignant neoplasm of upper-outer quadrant of right breast in female, estrogen receptor positive ([facility]CC) ? ?Abnormal posture ? ?Aftercare following surgery for neoplasm ? 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 She underwent a right lumpectomy and sentinel node biopsy (1 of 3 nodes positive) on 07/23/2021. It is ER/PR positive and HER2 negative with a Ki67 of 5%. She is on 3L of oxygen and has COPD. ? ?PRECAUTIONS: Recent Surgery, right UE Lymphedema risk, Other: COPD, on 3L oxygen ?  ? ?SUBJECTIVE: Pt has been doing exercises and feels she is doing better.  Cording is still present. ? ?PAIN:  ?Are you having pain? Yes: NPRS scale: 2/10 ?Pain location: right lateral shoulder ?Pain description: nagging achy ?Aggravating factors: a lot of use ?Relieving factors: tylenol ? ? ?OBSERVATIONS: ?Right breast incision is healing well. No redness present. There is edema present with what is likely a moderate sized seroma near her axilla. Significant axillary cording present that extends into her anterior forearm (see photos taken). Right lateral breast appears to have some cording as well. ?  ?POSTURE:  ?Forward head, rounded shoulders, increased thoracic kyphosis ?  ?LYMPHEDEMA ASSESSMENT:  ?  ?  A/PROM Right ?06/03/2021 Left ?06/03/2021 Right  ?08/17/2021  ?Shoulder extension 35 55 54  ?Shoulder flexion 145 142 97  ?Shoulder abduction 152 166 105  ?Shoulder internal rotation 72 69 70  ?Shoulder external rotation 80 81 84  ?                        (Blank rows = not tested) ?  ?   ?UPPER EXTREMITY STRENGTH: Not tested ?  ?  ?LYMPHEDEMA ASSESSMENTS:  ?  ?LANDMARK RIGHT ?06/03/2021 LEFT ?06/03/2021  RIGHT ?08/17/2021 LEFT ?08/17/2021  ?10 cm proximal to olecranon process 22 21.5 21.8 21.4  ?Olecranon process 22.4 21.4 22.3 21.2  ?10 cm proximal to ulnar styloid process 18.7 17.9 17.8 17.1  ?Just proximal to ulnar styloid process 14.7 14.5 14.3 14.2  ?Across hand at thumb web space 18.5 18.2 18.5 17.5  ?At base of 2nd digit 5.9 6.9 5.9 5.8  ?(Blank rows = not tested) ?  ? ?TREATMENT TODAY ?08/26/2021 ?Treatment performed in supine with head of table elevated ?Reviewed supine clasped hand flexion and stargazer stretch. Discussed elbow extension stretching and addition of wrist extension to stretch cords in forearm. ?MANUAL ?MFR techniques to right axilla, upper arm, antecubital fossa and forearm cords. ?PROM to Right shoulder flexion, abduction, D2 flexion, and ER. ?             ? ?  ?PATIENT EDUCATION:  ?Education details: HEP, lymphedema education ?Person educated: Patient ?Education method: Explanation, Demonstration, and Handouts ?Education comprehension: verbalized understanding ?  ?  ?HOME EXERCISE PROGRAM: ?           Reviewed previously given post op HEP. Showed supine flexion and stargazer. Showed elbow extension with addition of wrist extension to stretch forearm cords ?  ?  ?ASSESSMENT: ?  ?CLINICAL IMPRESSION: ?Performed AAROM and PROM activities in addition to [place] techniques to cording. 1 small pop was noted.  Pt continues with multiple cords throughout the right upper extremity but felt much better after treatment ? ?  ?Pt will benefit from skilled therapeutic intervention to improve on the following deficits: Decreased knowledge of precautions, impaired UE functional use, pain, decreased ROM, postural dysfunction.  ?  ?PT treatment/interventions: ADL/Self care home management, Therapeutic exercises, Therapeutic activity, Neuromuscular re-education, Balance training, Gait training, Patient/Family education, and Joint mobilization ?  ?  ?  ?  ?GOALS: ?Goals reviewed with patient? Yes ?  ?LONG TERM GOALS:   (STG=LTG) ?  ?GOALS Name Target Date Goal status  ?1 Pt will demonstrate she has regained full shoulder ROM and function post operatively compared to baselines.  ?Baseline: 09/14/2021 IN PROGRESS  ?2 Patient will report she is able to return to fishing. 09/14/2021 NEW  ?3 Patient will demonstrate she has regained right shoulder flexion to be >/= 140 degrees for increased ease reaching. 09/14/2021 NEW  ?4 Patient will demonstrate she has regained right shoulder abduction to be >/= 140 degrees for increased ease reaching. 09/14/2021 NEW  ?5 Patient will improve her DASH score to be </= 8 for improved overall UE function. 09/14/2021 NEW  ?  ?  ?  ?PLAN: ?PT FREQUENCY/DURATION: 2x/week for 4 weeks ?  ?PLAN FOR NEXT SESSION: Begin PROM and AAROM exercises; manual therapy for cording (myofascial release) ?  ?  ? ? ? ? ?Claris Pong, PT ?08/26/2021, 1:47 PM ? ?  ? ?

## 2021-08-31 ENCOUNTER — Encounter: Payer: Self-pay | Admitting: *Deleted

## 2021-08-31 DIAGNOSIS — Z510 Encounter for antineoplastic radiation therapy: Secondary | ICD-10-CM | POA: Diagnosis not present

## 2021-09-01 ENCOUNTER — Ambulatory Visit
Admission: RE | Admit: 2021-09-01 | Discharge: 2021-09-01 | Disposition: A | Discharge status: Home / Self Care | Payer: Medicare Other | Source: Ambulatory Visit | Attending: Radiation Oncology | Admitting: Radiation Oncology

## 2021-09-01 ENCOUNTER — Ambulatory Visit: Payer: Medicare Other | Admitting: Radiation Oncology

## 2021-09-01 ENCOUNTER — Other Ambulatory Visit: Payer: Self-pay

## 2021-09-01 ENCOUNTER — Telehealth: Payer: Self-pay | Admitting: Hematology and Oncology

## 2021-09-01 DIAGNOSIS — C50411 Malignant neoplasm of upper-outer quadrant of right female breast: Secondary | ICD-10-CM

## 2021-09-01 DIAGNOSIS — Z510 Encounter for antineoplastic radiation therapy: Secondary | ICD-10-CM | POA: Diagnosis not present

## 2021-09-01 MED ORDER — RADIAPLEXRX EX GEL: Freq: Once | CUTANEOUS | Status: AC

## 2021-09-01 NOTE — Telephone Encounter (Signed)
Per 4/10 inbasket. Called and spoke to pt.  Pt confirmed appointment  ?

## 2021-09-01 NOTE — Progress Notes (Signed)
Pt here for patient teaching.    Pt given Radiation and You booklet, skin care instructions, and Radiaplex gel.    Reviewed areas of pertinence such as fatigue, hair loss in treatment field, skin changes, breast tenderness, and breast swelling .   Pt able to give teach back of to pat skin and use unscented/gentle soap,apply Radiaplex bid, avoid applying anything to skin within 4 hours of treatment, avoid wearing an under wire bra, and to use an electric razor if they must shave.   Pt verbalizes understanding of information given and will contact nursing with any questions or concerns.           

## 2021-09-02 ENCOUNTER — Ambulatory Visit: Payer: Medicare Other

## 2021-09-02 ENCOUNTER — Ambulatory Visit
Admission: RE | Admit: 2021-09-02 | Discharge: 2021-09-02 | Disposition: A | Discharge status: Home / Self Care | Payer: Medicare Other | Source: Ambulatory Visit | Attending: Radiation Oncology | Admitting: Radiation Oncology

## 2021-09-02 DIAGNOSIS — Z510 Encounter for antineoplastic radiation therapy: Secondary | ICD-10-CM | POA: Diagnosis not present

## 2021-09-03 ENCOUNTER — Other Ambulatory Visit: Payer: Self-pay

## 2021-09-03 ENCOUNTER — Ambulatory Visit: Payer: Medicare Other

## 2021-09-03 ENCOUNTER — Ambulatory Visit
Admission: RE | Admit: 2021-09-03 | Discharge: 2021-09-03 | Disposition: A | Discharge status: Home / Self Care | Payer: Medicare Other | Source: Ambulatory Visit | Attending: Radiation Oncology | Admitting: Radiation Oncology

## 2021-09-03 DIAGNOSIS — Z510 Encounter for antineoplastic radiation therapy: Secondary | ICD-10-CM | POA: Diagnosis not present

## 2021-09-04 ENCOUNTER — Ambulatory Visit
Admission: RE | Admit: 2021-09-04 | Discharge: 2021-09-04 | Disposition: A | Discharge status: Home / Self Care | Payer: Medicare Other | Source: Ambulatory Visit | Attending: Radiation Oncology | Admitting: Radiation Oncology

## 2021-09-04 ENCOUNTER — Ambulatory Visit: Payer: Medicare Other

## 2021-09-04 DIAGNOSIS — Z510 Encounter for antineoplastic radiation therapy: Secondary | ICD-10-CM | POA: Diagnosis not present

## 2021-09-07 ENCOUNTER — Ambulatory Visit: Payer: Medicare Other

## 2021-09-07 ENCOUNTER — Other Ambulatory Visit: Payer: Self-pay

## 2021-09-07 ENCOUNTER — Ambulatory Visit
Admission: RE | Admit: 2021-09-07 | Discharge: 2021-09-07 | Disposition: A | Discharge status: Home / Self Care | Payer: Medicare Other | Source: Ambulatory Visit | Attending: Radiation Oncology | Admitting: Radiation Oncology

## 2021-09-07 DIAGNOSIS — Z510 Encounter for antineoplastic radiation therapy: Secondary | ICD-10-CM | POA: Diagnosis not present

## 2021-09-07 DIAGNOSIS — C50411 Malignant neoplasm of upper-outer quadrant of right female breast: Secondary | ICD-10-CM | POA: Diagnosis not present

## 2021-09-07 DIAGNOSIS — Z170 Estrogen receptor positive status [ER+]: Secondary | ICD-10-CM

## 2021-09-07 DIAGNOSIS — Z483 Aftercare following surgery for neoplasm: Secondary | ICD-10-CM

## 2021-09-07 DIAGNOSIS — R293 Abnormal posture: Secondary | ICD-10-CM

## 2021-09-07 NOTE — Therapy (Signed)
?OUTPATIENT PHYSICAL THERAPY TREATMENT NOTE ? ? ?Patient Name: Caroline Greer ?MRN: [id number] ?DOB:September 10, 1955, 66 y.o., female ?Today's Date: 09/07/2021 ? ?PCP: [name], MD ?REFERRING PROVIDER: Toth,Paul III,MD ? ?END OF SESSION:  ? PT End of Session - 09/07/21 1505   ? ? Visit Number 4   ? Number of Visits 10   ? Date for PT Re-Evaluation 09/14/21   ? PT Start Time 1505   ? PT Stop Time 3151   released early because O2 ran out  ? PT Time Calculation (min) 39 min   ? Activity Tolerance Patient tolerated treatment well   ? Behavior During Therapy [facility] for tasks assessed/performed   ? ?  ?  ? ?  ? ? ?Past Medical History:  ?Diagnosis Date  ? Acute respiratory failure with hypoxia ([place]) 09/30/2018  ? Breast cancer ([place])   ? Constipation   ? COPD exacerbation ([place]) 01/05/2019  ? Depression with anxiety 09/30/2018  ? Dyspnea   ? Essential hypertension 09/30/2018  ? Hypertension   ? Tobacco dependence due to cigarettes 09/30/2018  ? ?Past Surgical History:  ?Procedure Laterality Date  ? BREAST LUMPECTOMY WITH RADIOACTIVE SEED AND SENTINEL LYMPH NODE BIOPSY Right 07/23/2021  ? Procedure: RIGHT BREAST LUMPECTOMY WITH RADIOACTIVE SEED AND SENTINEL LYMPH NODE BIOPSY;  Surgeon: Jovita Kussmaul, MD;  Location: [place];  Service: General;  Laterality: Right;  ? HAND SURGERY Right 1997  ? Hand Fracture (right thumb & hand)  ? LAPAROTOMY  1974  ? RIGHT/LEFT HEART CATH AND CORONARY ANGIOGRAPHY N/A 10/02/2019  ? Procedure: RIGHT/LEFT HEART CATH AND CORONARY ANGIOGRAPHY;  Surgeon: Sherren Mocha, MD;  Location: [place] [facility];  Service: Cardiovascular;  Laterality: N/A;  ? ?Patient Active Problem List  ? Diagnosis Date Noted  ? Genetic testing 06/03/2021  ? Malignant neoplasm of upper-outer quadrant of right breast in female, estrogen receptor positive ([place]) 06/01/2021  ? Abnormal cardiac CT angiography 10/02/2019  ? Constipation   ? COPD (chronic obstructive pulmonary disease) ([place]) 01/05/2019  ? Encounter for screening for HIV  01/05/2019  ? COPD exacerbation ([place]) 01/05/2019  ? Acute respiratory failure with hypoxia ([place]) 09/30/2018  ? Essential hypertension 09/30/2018  ? Tobacco dependence due to cigarettes 09/30/2018  ? Depression with anxiety 09/30/2018  ? ? ?REFERRING DIAG: s/p Right Lumpectomy with SLNB ? ?THERAPY DIAG:  ?Malignant neoplasm of upper-outer quadrant of right breast in female, estrogen receptor positive ([place]) ? ?Abnormal posture ? ?Aftercare following surgery for neoplasm ? 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 She underwent a right lumpectomy and sentinel node biopsy (1 of 3 nodes positive) on 07/23/2021. It is ER/PR positive and HER2 negative with a Ki67 of 5%. She is on 3L of oxygen and has COPD. ? ?PRECAUTIONS: Recent Surgery, right UE Lymphedema risk, Other: COPD, on 3L oxygen ?  ? ?SUBJECTIVE: Pt has been doing exercises and feels she is doing better.  I might have popped the cords loose; I reached into the freezer and heard a pop and after that it was moving better. I don't feel it as much. I just had radiation. This is my second week. That's a lot for me to do both on 1 day. I am tired today. ?PAIN:  ?No, not today ? ?OBSERVATIONS: ?Right breast incision is healing well. No redness present. There is edema present with what is likely a moderate sized seroma near her axilla. Significant axillary cording present that extends into her anterior  forearm (see photos taken). Right lateral breast appears to have some cording as well. ?  ?POSTURE:  ?Forward head, rounded shoulders, increased thoracic kyphosis ?  ?LYMPHEDEMA ASSESSMENT:  ?  ?A/PROM Right ?06/03/2021 Left ?06/03/2021 Right  ?08/17/2021  ?Shoulder extension 35 55 54  ?Shoulder flexion 145 142 97  ?Shoulder abduction 152 166 105  ?Shoulder internal rotation 72 69 70  ?Shoulder external rotation 80 81 84  ?                        (Blank rows = not tested) ?  ?   ?UPPER EXTREMITY  STRENGTH: Not tested ?  ?  ?LYMPHEDEMA ASSESSMENTS:  ?  ?LANDMARK RIGHT ?06/03/2021 LEFT ?06/03/2021 RIGHT ?08/17/2021 LEFT ?08/17/2021  ?10 cm proximal to olecranon process 22 21.5 21.8 21.4  ?Olecranon process 22.4 21.4 22.3 21.2  ?10 cm proximal to ulnar styloid process 18.7 17.9 17.8 17.1  ?Just proximal to ulnar styloid process 14.7 14.5 14.3 14.2  ?Across hand at thumb web space 18.5 18.2 18.5 17.5  ?At base of 2nd digit 5.9 6.9 5.9 5.8  ?(Blank rows = not tested) ?  ? ?TREATMENT TODAY ?09/07/2021 ?Supine with table elevated;pt using O2. MFR techniques to right UE areas of cording which although still present are greatly improved.  Soft tissue mobilization to right pectorals, lats and right UT. ?PROM to right shoulder flexion, abd, D2 flexion and ER ?Scar massage to incision site with instruction to pt. ?Pts O2 concentrator ran out so stopped therapy early and walked with pt to car. ?08/26/2021 ?Treatment performed in supine with head of table elevated ?Reviewed supine clasped hand flexion and stargazer stretch. Discussed elbow extension stretching and addition of wrist extension to stretch cords in forearm. ?MANUAL ?MFR techniques to right axilla, upper arm, antecubital fossa and forearm cords. ?PROM to Right shoulder flexion, abduction, D2 flexion, and ER. ?             ? ?  ?PATIENT EDUCATION:  ?Education details: HEP, lymphedema education ?Person educated: Patient ?Education method: Explanation, Demonstration, and Handouts ?Education comprehension: verbalized understanding ?  ?  ?HOME EXERCISE PROGRAM: ?           Reviewed previously given post op HEP. Showed supine flexion and stargazer. Showed elbow extension with addition of wrist extension to stretch forearm cords ?  ?  ?ASSESSMENT: ?  ?CLINICAL IMPRESSION: ?Pt has been compliant with exercises and cording although still present is better and not limiting ROM as much. Fibrosis/scar tissue noted under incision site. No pops felt with MFR today.Ended treatment  slightly early secondary to O2 concentrator running out without warning ? ? ?  ?Pt will benefit from skilled therapeutic intervention to improve on the following deficits: Decreased knowledge of precautions, impaired UE functional use, pain, decreased ROM, postural dysfunction.  ?  ?PT treatment/interventions: ADL/Self care home management, Therapeutic exercises, Therapeutic activity, Neuromuscular re-education, Balance training, Gait training, Patient/Family education, and Joint mobilization ?  ?  ?  ?  ?GOALS: ?Goals reviewed with patient? Yes ?  ?LONG TERM GOALS:  (STG=LTG) ?  ?GOALS Name Target Date Goal status  ?1 Pt will demonstrate she has regained full shoulder ROM and function post operatively compared to baselines.  ?Baseline: 09/14/2021 IN PROGRESS  ?2 Patient will report she is able to return to fishing. 09/14/2021 NEW  ?3 Patient will demonstrate she has regained right shoulder flexion to be >/= 140 degrees for increased ease reaching. 09/14/2021 NEW  ?4  Patient will demonstrate she has regained right shoulder abduction to be >/= 140 degrees for increased ease reaching. 09/14/2021 NEW  ?5 Patient will improve her DASH score to be </= 8 for improved overall UE function. 09/14/2021 NEW  ?  ?  ?  ?PLAN: ?PT FREQUENCY/DURATION: 2x/week for 4 weeks ?  ?PLAN FOR NEXT SESSION: Begin PROM and AAROM exercises; manual therapy for cording, (myofascial release), scar mobilization, check ROM/goals ?  ?  ? ? ? ? ?Claris Pong, PT ?09/07/2021, 3:48 PM ? ?  ? ?

## 2021-09-08 ENCOUNTER — Other Ambulatory Visit: Payer: Self-pay

## 2021-09-08 ENCOUNTER — Ambulatory Visit
Admission: RE | Admit: 2021-09-08 | Discharge: 2021-09-08 | Disposition: A | Discharge status: Home / Self Care | Payer: Medicare Other | Source: Ambulatory Visit | Attending: Radiation Oncology | Admitting: Radiation Oncology

## 2021-09-08 ENCOUNTER — Ambulatory Visit: Payer: Medicare Other

## 2021-09-08 DIAGNOSIS — Z510 Encounter for antineoplastic radiation therapy: Secondary | ICD-10-CM | POA: Diagnosis not present

## 2021-09-08 LAB — RAD ONC ARIA SESSION SUMMARY

## 2021-09-09 ENCOUNTER — Other Ambulatory Visit: Payer: Self-pay

## 2021-09-09 ENCOUNTER — Ambulatory Visit
Admission: RE | Admit: 2021-09-09 | Discharge: 2021-09-09 | Disposition: A | Discharge status: Home / Self Care | Payer: Medicare Other | Source: Ambulatory Visit | Attending: Radiation Oncology | Admitting: Radiation Oncology

## 2021-09-09 ENCOUNTER — Ambulatory Visit: Payer: Medicare Other

## 2021-09-09 DIAGNOSIS — Z510 Encounter for antineoplastic radiation therapy: Secondary | ICD-10-CM | POA: Diagnosis not present

## 2021-09-09 LAB — RAD ONC ARIA SESSION SUMMARY

## 2021-09-10 ENCOUNTER — Other Ambulatory Visit: Payer: Self-pay

## 2021-09-10 ENCOUNTER — Ambulatory Visit: Payer: Medicare Other

## 2021-09-10 ENCOUNTER — Ambulatory Visit
Admission: RE | Admit: 2021-09-10 | Discharge: 2021-09-10 | Disposition: A | Discharge status: Home / Self Care | Payer: Medicare Other | Source: Ambulatory Visit | Attending: Radiation Oncology | Admitting: Radiation Oncology

## 2021-09-10 DIAGNOSIS — Z483 Aftercare following surgery for neoplasm: Secondary | ICD-10-CM

## 2021-09-10 DIAGNOSIS — R293 Abnormal posture: Secondary | ICD-10-CM

## 2021-09-10 DIAGNOSIS — Z170 Estrogen receptor positive status [ER+]: Secondary | ICD-10-CM

## 2021-09-10 DIAGNOSIS — C50411 Malignant neoplasm of upper-outer quadrant of right female breast: Secondary | ICD-10-CM | POA: Diagnosis not present

## 2021-09-10 DIAGNOSIS — Z510 Encounter for antineoplastic radiation therapy: Secondary | ICD-10-CM | POA: Diagnosis not present

## 2021-09-10 LAB — RAD ONC ARIA SESSION SUMMARY

## 2021-09-10 NOTE — Therapy (Signed)
?OUTPATIENT PHYSICAL THERAPY TREATMENT NOTE ? ? ?Patient Name: Caroline Greer ?MRN: [id number] ?DOB:08-16-1955, 66 y.o., female ?Today's Date: 09/10/2021 ? ?PCP: [name], MD ?REFERRING PROVIDER: Toth,Paul III,MD ? ?END OF SESSION:  ? PT End of Session - 09/10/21 1502   ? ? Visit Number 5   ? Number of Visits 10   ? Date for PT Re-Evaluation 09/14/21   ? PT Start Time 1503   ? PT Stop Time 5009   ? PT Time Calculation (min) 46 min   ? Equipment Utilized During Treatment Oxygen   ? Activity Tolerance Patient tolerated treatment well   ? Behavior During Therapy [facility] for tasks assessed/performed   ? ?  ?  ? ?  ? ? ?Past Medical History:  ?Diagnosis Date  ? Acute respiratory failure with hypoxia ([place]) 09/30/2018  ? Breast cancer ([place])   ? Constipation   ? COPD exacerbation ([place]) 01/05/2019  ? Depression with anxiety 09/30/2018  ? Dyspnea   ? Essential hypertension 09/30/2018  ? Hypertension   ? Tobacco dependence due to cigarettes 09/30/2018  ? ?Past Surgical History:  ?Procedure Laterality Date  ? BREAST LUMPECTOMY WITH RADIOACTIVE SEED AND SENTINEL LYMPH NODE BIOPSY Right 07/23/2021  ? Procedure: RIGHT BREAST LUMPECTOMY WITH RADIOACTIVE SEED AND SENTINEL LYMPH NODE BIOPSY;  Surgeon: Jovita Kussmaul, MD;  Location: [place];  Service: General;  Laterality: Right;  ? HAND SURGERY Right 1997  ? Hand Fracture (right thumb & hand)  ? LAPAROTOMY  1974  ? RIGHT/LEFT HEART CATH AND CORONARY ANGIOGRAPHY N/A 10/02/2019  ? Procedure: RIGHT/LEFT HEART CATH AND CORONARY ANGIOGRAPHY;  Surgeon: Sherren Mocha, MD;  Location: [place] [facility];  Service: Cardiovascular;  Laterality: N/A;  ? ?Patient Active Problem List  ? Diagnosis Date Noted  ? Genetic testing 06/03/2021  ? Malignant neoplasm of upper-outer quadrant of right breast in female, estrogen receptor positive ([place]) 06/01/2021  ? Abnormal cardiac CT angiography 10/02/2019  ? Constipation   ? COPD (chronic obstructive pulmonary disease) ([place]) 01/05/2019  ? Encounter for  screening for HIV 01/05/2019  ? COPD exacerbation ([place]) 01/05/2019  ? Acute respiratory failure with hypoxia ([place]) 09/30/2018  ? Essential hypertension 09/30/2018  ? Tobacco dependence due to cigarettes 09/30/2018  ? Depression with anxiety 09/30/2018  ? ? ?REFERRING DIAG: s/p Right Lumpectomy with SLNB ? ?THERAPY DIAG:  ?Malignant neoplasm of upper-outer quadrant of right breast in female, estrogen receptor positive ([place]) ? ?Abnormal posture ? ?Aftercare following surgery for neoplasm ? 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 She underwent a right lumpectomy and sentinel node biopsy (1 of 3 nodes positive) on 07/23/2021. It is ER/PR positive and HER2 negative with a Ki67 of 5%. She is on 3L of oxygen and has COPD. ? ?PRECAUTIONS: Recent Surgery, right UE Lymphedema risk, Other: COPD, on 3L oxygen ?  ? ?SUBJECTIVE: Just left radiation.  I am really tired and don't feel very good, but I am OK to be here. The cording seems to be better. ? ?PAIN:  ?No, not today ? ?OBSERVATIONS: ?Right breast incision is healing well. No redness present. There is edema present with what is likely a moderate sized seroma near her axilla. Significant axillary cording present that extends into her anterior forearm (see photos taken). Right lateral breast appears to have some cording as well. ?  ?POSTURE:  ?Forward head, rounded shoulders, increased thoracic kyphosis ?  ?LYMPHEDEMA ASSESSMENT:  ?  ?A/PROM Right ?06/03/2021 Left ?06/03/2021  Right  ?08/17/2021 Right ?09/10/2021  ?Shoulder extension 35 55 54 60  ?Shoulder flexion 145 142 97 153  ?Shoulder abduction 152 166 105 170  ?Shoulder internal rotation 72 69 70   ?Shoulder external rotation 80 81 84 96  ?                        (Blank rows = not tested) ?  ?   ?UPPER EXTREMITY STRENGTH: Not tested ?  ?  ?LYMPHEDEMA ASSESSMENTS:  ?  ?LANDMARK RIGHT ?06/03/2021 LEFT ?06/03/2021 RIGHT ?08/17/2021 LEFT ?08/17/2021  ?10 cm  proximal to olecranon process 22 21.5 21.8 21.4  ?Olecranon process 22.4 21.4 22.3 21.2  ?10 cm proximal to ulnar styloid process 18.7 17.9 17.8 17.1  ?Just proximal to ulnar styloid process 14.7 14.5 14.3 14.2  ?Across hand at thumb web space 18.5 18.2 18.5 17.5  ?At base of 2nd digit 5.9 6.9 5.9 5.8  ?(Blank rows = not tested) ?  ? ?TREATMENT TODAY ?09/10/2021 ?Supine with table elevated;pt using O2. MFR techniques to right UE areas of cording with emphasis on axilla which although still present are greatly improved.   ?PROM to right shoulder flexion, abd, D2 flexion and ER ?Measured AROM and checked goals ?09/07/2021 ?Supine with table elevated;pt using O2. MFR techniques to right UE areas of cording which although still present are greatly improved.  Soft tissue mobilization to right pectorals, lats and right UT. ?PROM to right shoulder flexion, abd, D2 flexion and ER ?Scar massage to incision site with instruction to pt. ?Pts O2 concentrator ran out so stopped therapy early and walked with pt to car. ?08/26/2021 ?Treatment performed in supine with head of table elevated ?Reviewed supine clasped hand flexion and stargazer stretch. Discussed elbow extension stretching and addition of wrist extension to stretch cords in forearm. ?MANUAL ?MFR techniques to right axilla, upper arm, antecubital fossa and forearm cords. ?PROM to Right shoulder flexion, abduction, D2 flexion, and ER. ?             ? ?  ?PATIENT EDUCATION:  ?Education details: HEP, lymphedema education ?Person educated: Patient ?Education method: Explanation, Demonstration, and Handouts ?Education comprehension: verbalized understanding ?  ?  ?HOME EXERCISE PROGRAM: ?           Reviewed previously given post op HEP. Showed supine flexion and stargazer. Showed elbow extension with addition of wrist extension to stretch forearm cords ?  ?  ?ASSESSMENT: ?  ?CLINICAL IMPRESSION: ?Pt has been compliant with exercises and cording although still present  mainly in  the axillary region is better and not limiting ROM She has achieved all ROM goals for the right shoulder and pain is 90% better overall. She has not yet tried fishing because she needs to wait until the water reaches 55 degrees. We discussed the need to continue with exercises during radiation to be sure she is not getting tight, and to keep an eye out for breast swelling and contact us immediately if she needs to return ? ? ?  ?Pt will benefit from skilled therapeutic intervention to improve on the following deficits: Decreased knowledge of precautions, impaired UE functional use, pain, decreased ROM, postural dysfunction.  ?  ?PT treatment/interventions: ADL/Self care home management, Therapeutic exercises, Therapeutic activity, Neuromuscular re-education, Balance training, Gait training, Patient/Family education, and Joint mobilization ?  ?  ?  ?  ?GOALS: ?Goals reviewed with patient? Yes ?  ?LONG TERM GOALS:  (STG=LTG) ?  ?GOALS Name Target Date  Goal status  ?1 Pt will demonstrate she has regained full shoulder ROM and function post operatively compared to baselines.  ?Baseline: 09/14/2021 MET 09/10/2021 ?Except hasn't tried fishing  ?2 Patient will report she is able to return to fishing. 09/14/2021 NEW  ?3 Patient will demonstrate she has regained right shoulder flexion to be >/= 140 degrees for increased ease reaching. 09/14/2021 MET ?09/10/2021  ?4 Patient will demonstrate she has regained right shoulder abduction to be >/= 140 degrees for increased ease reaching. 09/14/2021 MET ?09/11/2022  ?5 Patient will improve her DASH score to be </= 8 for improved overall UE function. 09/14/2021 MET ?09/10/2021  ?  ?  ?  ?PLAN: ?PT FREQUENCY/DURATION: 2x/week for 4 weeks ?  ?PLAN FOR NEXT SESSION:  ? Pt is discharged to HEP. Advised to continue exercises throughout radiation and to watch for breast swelling. Advised to contact us ASAP if needed. ?  ? ? ? ? ?Claris Pong, PT ?09/10/2021, 3:56 PM ? ?  ? ?

## 2021-09-11 ENCOUNTER — Other Ambulatory Visit: Payer: Self-pay

## 2021-09-11 ENCOUNTER — Ambulatory Visit
Admission: RE | Admit: 2021-09-11 | Discharge: 2021-09-11 | Disposition: A | Discharge status: Home / Self Care | Payer: Medicare Other | Source: Ambulatory Visit | Attending: Radiation Oncology | Admitting: Radiation Oncology

## 2021-09-11 ENCOUNTER — Ambulatory Visit: Payer: Medicare Other

## 2021-09-11 DIAGNOSIS — Z510 Encounter for antineoplastic radiation therapy: Secondary | ICD-10-CM | POA: Diagnosis not present

## 2021-09-11 LAB — RAD ONC ARIA SESSION SUMMARY
Course Elapsed Days: 10
Plan Fractions Treated to Date: 9
Plan Fractions Treated to Date: 9
Plan Prescribed Dose Per Fraction: 1.8 Gy
Plan Prescribed Dose Per Fraction: 1.8 Gy
Plan Total Fractions Prescribed: 28
Plan Total Fractions Prescribed: 28
Plan Total Prescribed Dose: 50.4 Gy
Plan Total Prescribed Dose: 50.4 Gy
Reference Point Dosage Given to Date: 16.2 Gy
Reference Point Dosage Given to Date: 16.2 Gy
Reference Point Session Dosage Given: 1.8 Gy
Reference Point Session Dosage Given: 1.8 Gy
Session Number: 9

## 2021-09-14 ENCOUNTER — Telehealth: Payer: Self-pay | Admitting: Pulmonary Disease

## 2021-09-14 ENCOUNTER — Other Ambulatory Visit: Payer: Self-pay

## 2021-09-14 ENCOUNTER — Ambulatory Visit
Admission: RE | Admit: 2021-09-14 | Discharge: 2021-09-14 | Disposition: A | Discharge status: Home / Self Care | Payer: Medicare Other | Source: Ambulatory Visit | Attending: Radiation Oncology | Admitting: Radiation Oncology

## 2021-09-14 ENCOUNTER — Ambulatory Visit: Payer: Medicare Other

## 2021-09-14 DIAGNOSIS — Z510 Encounter for antineoplastic radiation therapy: Secondary | ICD-10-CM | POA: Diagnosis not present

## 2021-09-14 LAB — RAD ONC ARIA SESSION SUMMARY

## 2021-09-15 ENCOUNTER — Other Ambulatory Visit: Payer: Self-pay

## 2021-09-15 ENCOUNTER — Ambulatory Visit
Admission: RE | Admit: 2021-09-15 | Discharge: 2021-09-15 | Disposition: A | Discharge status: Home / Self Care | Payer: Medicare Other | Source: Ambulatory Visit | Attending: Radiation Oncology | Admitting: Radiation Oncology

## 2021-09-15 ENCOUNTER — Ambulatory Visit: Payer: Medicare Other

## 2021-09-15 ENCOUNTER — Ambulatory Visit: Payer: Medicare Other | Admitting: Radiation Oncology

## 2021-09-15 DIAGNOSIS — Z510 Encounter for antineoplastic radiation therapy: Secondary | ICD-10-CM | POA: Diagnosis not present

## 2021-09-15 LAB — RAD ONC ARIA SESSION SUMMARY

## 2021-09-16 ENCOUNTER — Ambulatory Visit
Admission: RE | Admit: 2021-09-16 | Discharge: 2021-09-16 | Disposition: A | Discharge status: Home / Self Care | Payer: Medicare Other | Source: Ambulatory Visit | Attending: Radiation Oncology | Admitting: Radiation Oncology

## 2021-09-16 ENCOUNTER — Ambulatory Visit: Payer: Medicare Other

## 2021-09-16 ENCOUNTER — Other Ambulatory Visit: Payer: Self-pay

## 2021-09-16 DIAGNOSIS — Z510 Encounter for antineoplastic radiation therapy: Secondary | ICD-10-CM | POA: Diagnosis not present

## 2021-09-16 LAB — RAD ONC ARIA SESSION SUMMARY

## 2021-09-16 NOTE — Telephone Encounter (Signed)
Called and spoke with pt letting her know that since she is already established with [employer] that she would have to pay out of pocket for a POC. She said that she was currently on Medicaid and was told that her Medicaid would pay for her to receive one. Stated to her to call [employer] about this to see what they said and if they told her that they would use her Medicaid to pay for a POC to call us back. I did let her know with her being on continuous O2, she may require more liters of pulsed oxygen which would make the battery go quicker and she verbalized understanding. Pt said she would call us back if she found out from [employer] that they would pay for it and if she did consider still getting one. ?

## 2021-09-17 ENCOUNTER — Other Ambulatory Visit: Payer: Self-pay

## 2021-09-17 ENCOUNTER — Ambulatory Visit
Admission: RE | Admit: 2021-09-17 | Discharge: 2021-09-17 | Disposition: A | Discharge status: Home / Self Care | Payer: Medicare Other | Source: Ambulatory Visit | Attending: Radiation Oncology | Admitting: Radiation Oncology

## 2021-09-17 ENCOUNTER — Ambulatory Visit: Payer: Medicare Other

## 2021-09-17 DIAGNOSIS — Z510 Encounter for antineoplastic radiation therapy: Secondary | ICD-10-CM | POA: Diagnosis not present

## 2021-09-17 LAB — RAD ONC ARIA SESSION SUMMARY
Course Elapsed Days: 16
Plan Fractions Treated to Date: 13
Plan Fractions Treated to Date: 13
Plan Prescribed Dose Per Fraction: 1.8 Gy
Plan Prescribed Dose Per Fraction: 1.8 Gy
Plan Total Fractions Prescribed: 28
Plan Total Fractions Prescribed: 28
Plan Total Prescribed Dose: 50.4 Gy
Plan Total Prescribed Dose: 50.4 Gy
Reference Point Dosage Given to Date: 23.4 Gy
Reference Point Dosage Given to Date: 23.4 Gy
Reference Point Session Dosage Given: 1.8 Gy
Reference Point Session Dosage Given: 1.8 Gy
Session Number: 13

## 2021-09-18 ENCOUNTER — Ambulatory Visit: Payer: Medicare Other

## 2021-09-18 ENCOUNTER — Other Ambulatory Visit: Payer: Self-pay

## 2021-09-18 ENCOUNTER — Ambulatory Visit
Admission: RE | Admit: 2021-09-18 | Discharge: 2021-09-18 | Disposition: A | Discharge status: Home / Self Care | Payer: Medicare Other | Source: Ambulatory Visit | Attending: Radiation Oncology | Admitting: Radiation Oncology

## 2021-09-18 DIAGNOSIS — Z510 Encounter for antineoplastic radiation therapy: Secondary | ICD-10-CM | POA: Diagnosis not present

## 2021-09-18 LAB — RAD ONC ARIA SESSION SUMMARY
Course Elapsed Days: 17
Plan Fractions Treated to Date: 14
Plan Fractions Treated to Date: 14
Plan Prescribed Dose Per Fraction: 1.8 Gy
Plan Prescribed Dose Per Fraction: 1.8 Gy
Plan Total Fractions Prescribed: 28
Plan Total Fractions Prescribed: 28
Plan Total Prescribed Dose: 50.4 Gy
Plan Total Prescribed Dose: 50.4 Gy
Reference Point Dosage Given to Date: 25.2 Gy
Reference Point Dosage Given to Date: 25.2 Gy
Reference Point Session Dosage Given: 1.8 Gy
Reference Point Session Dosage Given: 1.8 Gy
Session Number: 14

## 2021-09-21 ENCOUNTER — Other Ambulatory Visit: Payer: Self-pay

## 2021-09-21 ENCOUNTER — Ambulatory Visit
Admission: RE | Admit: 2021-09-21 | Discharge: 2021-09-21 | Disposition: A | Discharge status: Home / Self Care | Payer: Medicare Other | Source: Ambulatory Visit | Attending: Radiation Oncology | Admitting: Radiation Oncology

## 2021-09-21 ENCOUNTER — Ambulatory Visit: Payer: Medicare Other

## 2021-09-21 DIAGNOSIS — Z510 Encounter for antineoplastic radiation therapy: Secondary | ICD-10-CM | POA: Insufficient documentation

## 2021-09-21 DIAGNOSIS — C50411 Malignant neoplasm of upper-outer quadrant of right female breast: Secondary | ICD-10-CM | POA: Diagnosis present

## 2021-09-21 DIAGNOSIS — L598 Other specified disorders of the skin and subcutaneous tissue related to radiation: Secondary | ICD-10-CM | POA: Insufficient documentation

## 2021-09-21 DIAGNOSIS — Z170 Estrogen receptor positive status [ER+]: Secondary | ICD-10-CM | POA: Diagnosis not present

## 2021-09-21 LAB — RAD ONC ARIA SESSION SUMMARY
Course Elapsed Days: 20
Plan Fractions Treated to Date: 15
Plan Fractions Treated to Date: 15
Plan Prescribed Dose Per Fraction: 1.8 Gy
Plan Prescribed Dose Per Fraction: 1.8 Gy
Plan Total Fractions Prescribed: 28
Plan Total Fractions Prescribed: 28
Plan Total Prescribed Dose: 50.4 Gy
Plan Total Prescribed Dose: 50.4 Gy
Reference Point Dosage Given to Date: 27 Gy
Reference Point Dosage Given to Date: 27 Gy
Reference Point Session Dosage Given: 1.8 Gy
Reference Point Session Dosage Given: 1.8 Gy
Session Number: 15

## 2021-09-21 MED ORDER — RADIAPLEXRX EX GEL: Freq: Once | CUTANEOUS | Status: AC

## 2021-09-22 ENCOUNTER — Other Ambulatory Visit: Payer: Self-pay

## 2021-09-22 ENCOUNTER — Ambulatory Visit: Payer: Medicare Other

## 2021-09-22 ENCOUNTER — Ambulatory Visit
Admission: RE | Admit: 2021-09-22 | Discharge: 2021-09-22 | Disposition: A | Discharge status: Home / Self Care | Payer: Medicare Other | Source: Ambulatory Visit | Attending: Radiation Oncology | Admitting: Radiation Oncology

## 2021-09-22 DIAGNOSIS — Z510 Encounter for antineoplastic radiation therapy: Secondary | ICD-10-CM | POA: Diagnosis not present

## 2021-09-22 LAB — RAD ONC ARIA SESSION SUMMARY
Course Elapsed Days: 21
Plan Fractions Treated to Date: 16
Plan Fractions Treated to Date: 16
Plan Prescribed Dose Per Fraction: 1.8 Gy
Plan Prescribed Dose Per Fraction: 1.8 Gy
Plan Total Fractions Prescribed: 28
Plan Total Fractions Prescribed: 28
Plan Total Prescribed Dose: 50.4 Gy
Plan Total Prescribed Dose: 50.4 Gy
Reference Point Dosage Given to Date: 28.8 Gy
Reference Point Dosage Given to Date: 28.8 Gy
Reference Point Session Dosage Given: 1.8 Gy
Reference Point Session Dosage Given: 1.8 Gy
Session Number: 16

## 2021-09-23 ENCOUNTER — Ambulatory Visit
Admission: RE | Admit: 2021-09-23 | Discharge: 2021-09-23 | Disposition: A | Discharge status: Home / Self Care | Payer: Medicare Other | Source: Ambulatory Visit | Attending: Radiation Oncology | Admitting: Radiation Oncology

## 2021-09-23 ENCOUNTER — Other Ambulatory Visit: Payer: Self-pay

## 2021-09-23 ENCOUNTER — Ambulatory Visit: Payer: Medicare Other

## 2021-09-23 DIAGNOSIS — Z510 Encounter for antineoplastic radiation therapy: Secondary | ICD-10-CM | POA: Diagnosis not present

## 2021-09-23 LAB — RAD ONC ARIA SESSION SUMMARY
Course Elapsed Days: 22
Plan Fractions Treated to Date: 17
Plan Fractions Treated to Date: 17
Plan Prescribed Dose Per Fraction: 1.8 Gy
Plan Prescribed Dose Per Fraction: 1.8 Gy
Plan Total Fractions Prescribed: 28
Plan Total Fractions Prescribed: 28
Plan Total Prescribed Dose: 50.4 Gy
Plan Total Prescribed Dose: 50.4 Gy
Reference Point Dosage Given to Date: 30.6 Gy
Reference Point Dosage Given to Date: 30.6 Gy
Reference Point Session Dosage Given: 1.8 Gy
Reference Point Session Dosage Given: 1.8 Gy
Session Number: 17

## 2021-09-24 ENCOUNTER — Ambulatory Visit
Admission: RE | Admit: 2021-09-24 | Discharge: 2021-09-24 | Disposition: A | Discharge status: Home / Self Care | Payer: Medicare Other | Source: Ambulatory Visit | Attending: Radiation Oncology | Admitting: Radiation Oncology

## 2021-09-24 ENCOUNTER — Ambulatory Visit: Payer: Medicare Other

## 2021-09-24 ENCOUNTER — Other Ambulatory Visit: Payer: Self-pay

## 2021-09-24 DIAGNOSIS — Z510 Encounter for antineoplastic radiation therapy: Secondary | ICD-10-CM | POA: Diagnosis not present

## 2021-09-24 LAB — RAD ONC ARIA SESSION SUMMARY
Course Elapsed Days: 23
Plan Fractions Treated to Date: 18
Plan Fractions Treated to Date: 18
Plan Prescribed Dose Per Fraction: 1.8 Gy
Plan Prescribed Dose Per Fraction: 1.8 Gy
Plan Total Fractions Prescribed: 28
Plan Total Fractions Prescribed: 28
Plan Total Prescribed Dose: 50.4 Gy
Plan Total Prescribed Dose: 50.4 Gy
Reference Point Dosage Given to Date: 32.4 Gy
Reference Point Dosage Given to Date: 32.4 Gy
Reference Point Session Dosage Given: 1.8 Gy
Reference Point Session Dosage Given: 1.8 Gy
Session Number: 18

## 2021-09-25 ENCOUNTER — Ambulatory Visit: Payer: Medicare Other

## 2021-09-25 ENCOUNTER — Other Ambulatory Visit: Payer: Self-pay

## 2021-09-25 ENCOUNTER — Ambulatory Visit
Admission: RE | Admit: 2021-09-25 | Discharge: 2021-09-25 | Disposition: A | Discharge status: Home / Self Care | Payer: Medicare Other | Source: Ambulatory Visit | Attending: Radiation Oncology | Admitting: Radiation Oncology

## 2021-09-25 DIAGNOSIS — Z510 Encounter for antineoplastic radiation therapy: Secondary | ICD-10-CM | POA: Diagnosis not present

## 2021-09-25 LAB — RAD ONC ARIA SESSION SUMMARY
Course Elapsed Days: 24
Plan Fractions Treated to Date: 19
Plan Fractions Treated to Date: 19
Plan Prescribed Dose Per Fraction: 1.8 Gy
Plan Prescribed Dose Per Fraction: 1.8 Gy
Plan Total Fractions Prescribed: 28
Plan Total Fractions Prescribed: 28
Plan Total Prescribed Dose: 50.4 Gy
Plan Total Prescribed Dose: 50.4 Gy
Reference Point Dosage Given to Date: 34.2 Gy
Reference Point Dosage Given to Date: 34.2 Gy
Reference Point Session Dosage Given: 1.8 Gy
Reference Point Session Dosage Given: 1.8 Gy
Session Number: 19

## 2021-09-28 ENCOUNTER — Ambulatory Visit
Admission: RE | Admit: 2021-09-28 | Discharge: 2021-09-28 | Disposition: A | Discharge status: Home / Self Care | Payer: Medicare Other | Source: Ambulatory Visit | Attending: Radiation Oncology | Admitting: Radiation Oncology

## 2021-09-28 ENCOUNTER — Ambulatory Visit: Payer: Medicare Other

## 2021-09-28 ENCOUNTER — Other Ambulatory Visit: Payer: Self-pay

## 2021-09-28 DIAGNOSIS — Z510 Encounter for antineoplastic radiation therapy: Secondary | ICD-10-CM | POA: Diagnosis not present

## 2021-09-28 LAB — RAD ONC ARIA SESSION SUMMARY
Course Elapsed Days: 27
Plan Fractions Treated to Date: 20
Plan Fractions Treated to Date: 20
Plan Prescribed Dose Per Fraction: 1.8 Gy
Plan Prescribed Dose Per Fraction: 1.8 Gy
Plan Total Fractions Prescribed: 28
Plan Total Fractions Prescribed: 28
Plan Total Prescribed Dose: 50.4 Gy
Plan Total Prescribed Dose: 50.4 Gy
Reference Point Dosage Given to Date: 36 Gy
Reference Point Dosage Given to Date: 36 Gy
Reference Point Session Dosage Given: 1.8 Gy
Reference Point Session Dosage Given: 1.8 Gy
Session Number: 20

## 2021-09-29 ENCOUNTER — Other Ambulatory Visit: Payer: Self-pay

## 2021-09-29 ENCOUNTER — Ambulatory Visit
Admission: RE | Admit: 2021-09-29 | Discharge: 2021-09-29 | Disposition: A | Discharge status: Home / Self Care | Payer: Medicare Other | Source: Ambulatory Visit | Attending: Radiation Oncology | Admitting: Radiation Oncology

## 2021-09-29 ENCOUNTER — Ambulatory Visit: Payer: Medicare Other | Admitting: Radiation Oncology

## 2021-09-29 DIAGNOSIS — Z510 Encounter for antineoplastic radiation therapy: Secondary | ICD-10-CM | POA: Diagnosis not present

## 2021-09-29 LAB — RAD ONC ARIA SESSION SUMMARY
Course Elapsed Days: 28
Plan Fractions Treated to Date: 21
Plan Fractions Treated to Date: 21
Plan Prescribed Dose Per Fraction: 1.8 Gy
Plan Prescribed Dose Per Fraction: 1.8 Gy
Plan Total Fractions Prescribed: 28
Plan Total Fractions Prescribed: 28
Plan Total Prescribed Dose: 50.4 Gy
Plan Total Prescribed Dose: 50.4 Gy
Reference Point Dosage Given to Date: 37.8 Gy
Reference Point Dosage Given to Date: 37.8 Gy
Reference Point Session Dosage Given: 1.8 Gy
Reference Point Session Dosage Given: 1.8 Gy
Session Number: 21

## 2021-09-30 ENCOUNTER — Other Ambulatory Visit: Payer: Self-pay

## 2021-09-30 ENCOUNTER — Ambulatory Visit
Admission: RE | Admit: 2021-09-30 | Discharge: 2021-09-30 | Disposition: A | Discharge status: Home / Self Care | Payer: Medicare Other | Source: Ambulatory Visit | Attending: Radiation Oncology | Admitting: Radiation Oncology

## 2021-09-30 DIAGNOSIS — Z510 Encounter for antineoplastic radiation therapy: Secondary | ICD-10-CM | POA: Diagnosis not present

## 2021-09-30 LAB — RAD ONC ARIA SESSION SUMMARY
Course Elapsed Days: 29
Plan Fractions Treated to Date: 22
Plan Fractions Treated to Date: 22
Plan Prescribed Dose Per Fraction: 1.8 Gy
Plan Prescribed Dose Per Fraction: 1.8 Gy
Plan Total Fractions Prescribed: 28
Plan Total Fractions Prescribed: 28
Plan Total Prescribed Dose: 50.4 Gy
Plan Total Prescribed Dose: 50.4 Gy
Reference Point Dosage Given to Date: 39.6 Gy
Reference Point Dosage Given to Date: 39.6 Gy
Reference Point Session Dosage Given: 1.8 Gy
Reference Point Session Dosage Given: 1.8 Gy
Session Number: 22

## 2021-10-01 ENCOUNTER — Other Ambulatory Visit: Payer: Self-pay

## 2021-10-01 ENCOUNTER — Ambulatory Visit
Admission: RE | Admit: 2021-10-01 | Discharge: 2021-10-01 | Disposition: A | Discharge status: Home / Self Care | Payer: Medicare Other | Source: Ambulatory Visit | Attending: Radiation Oncology | Admitting: Radiation Oncology

## 2021-10-01 DIAGNOSIS — Z510 Encounter for antineoplastic radiation therapy: Secondary | ICD-10-CM | POA: Diagnosis not present

## 2021-10-01 LAB — RAD ONC ARIA SESSION SUMMARY
Course Elapsed Days: 30
Plan Fractions Treated to Date: 23
Plan Fractions Treated to Date: 23
Plan Prescribed Dose Per Fraction: 1.8 Gy
Plan Prescribed Dose Per Fraction: 1.8 Gy
Plan Total Fractions Prescribed: 28
Plan Total Fractions Prescribed: 28
Plan Total Prescribed Dose: 50.4 Gy
Plan Total Prescribed Dose: 50.4 Gy
Reference Point Dosage Given to Date: 41.4 Gy
Reference Point Dosage Given to Date: 41.4 Gy
Reference Point Session Dosage Given: 1.8 Gy
Reference Point Session Dosage Given: 1.8 Gy
Session Number: 23

## 2021-10-02 ENCOUNTER — Other Ambulatory Visit: Payer: Self-pay

## 2021-10-02 ENCOUNTER — Ambulatory Visit
Admission: RE | Admit: 2021-10-02 | Discharge: 2021-10-02 | Disposition: A | Discharge status: Home / Self Care | Payer: Medicare Other | Source: Ambulatory Visit | Attending: Radiation Oncology | Admitting: Radiation Oncology

## 2021-10-02 DIAGNOSIS — Z510 Encounter for antineoplastic radiation therapy: Secondary | ICD-10-CM | POA: Diagnosis not present

## 2021-10-02 LAB — RAD ONC ARIA SESSION SUMMARY
Course Elapsed Days: 31
Plan Fractions Treated to Date: 24
Plan Fractions Treated to Date: 24
Plan Prescribed Dose Per Fraction: 1.8 Gy
Plan Prescribed Dose Per Fraction: 1.8 Gy
Plan Total Fractions Prescribed: 28
Plan Total Fractions Prescribed: 28
Plan Total Prescribed Dose: 50.4 Gy
Plan Total Prescribed Dose: 50.4 Gy
Reference Point Dosage Given to Date: 43.2 Gy
Reference Point Dosage Given to Date: 43.2 Gy
Reference Point Session Dosage Given: 1.8 Gy
Reference Point Session Dosage Given: 1.8 Gy
Session Number: 24

## 2021-10-05 ENCOUNTER — Other Ambulatory Visit: Payer: Self-pay

## 2021-10-05 ENCOUNTER — Ambulatory Visit
Admission: RE | Admit: 2021-10-05 | Discharge: 2021-10-05 | Disposition: A | Discharge status: Home / Self Care | Payer: Medicare Other | Source: Ambulatory Visit | Attending: Radiation Oncology | Admitting: Radiation Oncology

## 2021-10-05 DIAGNOSIS — Z510 Encounter for antineoplastic radiation therapy: Secondary | ICD-10-CM | POA: Diagnosis not present

## 2021-10-05 LAB — RAD ONC ARIA SESSION SUMMARY
Course Elapsed Days: 34
Plan Fractions Treated to Date: 25
Plan Fractions Treated to Date: 25
Plan Prescribed Dose Per Fraction: 1.8 Gy
Plan Prescribed Dose Per Fraction: 1.8 Gy
Plan Total Fractions Prescribed: 28
Plan Total Fractions Prescribed: 28
Plan Total Prescribed Dose: 50.4 Gy
Plan Total Prescribed Dose: 50.4 Gy
Reference Point Dosage Given to Date: 45 Gy
Reference Point Dosage Given to Date: 45 Gy
Reference Point Session Dosage Given: 1.8 Gy
Reference Point Session Dosage Given: 1.8 Gy
Session Number: 25

## 2021-10-06 ENCOUNTER — Ambulatory Visit
Admission: RE | Admit: 2021-10-06 | Discharge: 2021-10-06 | Disposition: A | Discharge status: Home / Self Care | Payer: Medicare Other | Source: Ambulatory Visit | Attending: Radiation Oncology | Admitting: Radiation Oncology

## 2021-10-06 ENCOUNTER — Other Ambulatory Visit: Payer: Self-pay

## 2021-10-06 DIAGNOSIS — Z510 Encounter for antineoplastic radiation therapy: Secondary | ICD-10-CM | POA: Diagnosis not present

## 2021-10-06 DIAGNOSIS — Z170 Estrogen receptor positive status [ER+]: Secondary | ICD-10-CM

## 2021-10-06 LAB — RAD ONC ARIA SESSION SUMMARY
Course Elapsed Days: 35
Plan Fractions Treated to Date: 26
Plan Fractions Treated to Date: 26
Plan Prescribed Dose Per Fraction: 1.8 Gy
Plan Prescribed Dose Per Fraction: 1.8 Gy
Plan Total Fractions Prescribed: 28
Plan Total Fractions Prescribed: 28
Plan Total Prescribed Dose: 50.4 Gy
Plan Total Prescribed Dose: 50.4 Gy
Reference Point Dosage Given to Date: 46.8 Gy
Reference Point Dosage Given to Date: 46.8 Gy
Reference Point Session Dosage Given: 1.8 Gy
Reference Point Session Dosage Given: 1.8 Gy
Session Number: 26

## 2021-10-07 ENCOUNTER — Ambulatory Visit
Admission: RE | Admit: 2021-10-07 | Discharge: 2021-10-07 | Disposition: A | Discharge status: Home / Self Care | Payer: Medicare Other | Source: Ambulatory Visit | Attending: Radiation Oncology | Admitting: Radiation Oncology

## 2021-10-07 ENCOUNTER — Other Ambulatory Visit: Payer: Self-pay

## 2021-10-07 DIAGNOSIS — Z510 Encounter for antineoplastic radiation therapy: Secondary | ICD-10-CM | POA: Diagnosis not present

## 2021-10-07 LAB — RAD ONC ARIA SESSION SUMMARY
Course Elapsed Days: 36
Plan Fractions Treated to Date: 27
Plan Fractions Treated to Date: 27
Plan Prescribed Dose Per Fraction: 1.8 Gy
Plan Prescribed Dose Per Fraction: 1.8 Gy
Plan Total Fractions Prescribed: 28
Plan Total Fractions Prescribed: 28
Plan Total Prescribed Dose: 50.4 Gy
Plan Total Prescribed Dose: 50.4 Gy
Reference Point Dosage Given to Date: 48.6 Gy
Reference Point Dosage Given to Date: 48.6 Gy
Reference Point Session Dosage Given: 1.8 Gy
Reference Point Session Dosage Given: 1.8 Gy
Session Number: 27

## 2021-10-08 ENCOUNTER — Other Ambulatory Visit: Payer: Self-pay

## 2021-10-08 ENCOUNTER — Ambulatory Visit
Admission: RE | Admit: 2021-10-08 | Discharge: 2021-10-08 | Disposition: A | Discharge status: Home / Self Care | Payer: Medicare Other | Source: Ambulatory Visit | Attending: Radiation Oncology | Admitting: Radiation Oncology

## 2021-10-08 DIAGNOSIS — Z510 Encounter for antineoplastic radiation therapy: Secondary | ICD-10-CM | POA: Diagnosis not present

## 2021-10-08 LAB — RAD ONC ARIA SESSION SUMMARY
Course Elapsed Days: 37
Plan Fractions Treated to Date: 28
Plan Fractions Treated to Date: 28
Plan Prescribed Dose Per Fraction: 1.8 Gy
Plan Prescribed Dose Per Fraction: 1.8 Gy
Plan Total Fractions Prescribed: 28
Plan Total Fractions Prescribed: 28
Plan Total Prescribed Dose: 50.4 Gy
Plan Total Prescribed Dose: 50.4 Gy
Reference Point Dosage Given to Date: 50.4 Gy
Reference Point Dosage Given to Date: 50.4 Gy
Reference Point Session Dosage Given: 1.8 Gy
Reference Point Session Dosage Given: 1.8 Gy
Session Number: 28

## 2021-10-08 NOTE — Progress Notes (Signed)
Patient Care Team: [name], MD as PCP - General (Internal Medicine) Sueanne Margarita, MD as PCP - Cardiology (Cardiology) Mauro Kaufmann, RN as Oncology Nurse Navigator Rockwell Germany, RN as Oncology Nurse Navigator Jovita Kussmaul, MD as Consulting Physician (General Surgery) Nicholas Lose, MD as Consulting Physician (Hematology and Oncology) Gery Pray, MD as Consulting Physician (Radiation Oncology)  DIAGNOSIS:  Encounter Diagnosis  Name Primary?   Malignant neoplasm of upper-outer quadrant of right breast in female, estrogen receptor positive ([place])     SUMMARY OF ONCOLOGIC HISTORY: Oncology History  Malignant neoplasm of upper-outer quadrant of right breast in female, estrogen receptor positive ([place])  05/20/2021 Initial Diagnosis   Palpable mass in the right breast upper outer quadrant: 1.1 cm along with distortion.  Mammogram ultrasound revealed 1.2 cm mass at 11 o'clock position, axilla negative, biopsy revealed grade 2 invasive lobular cancer ER 90%, PR 100%, HER2 negative, Ki-67 5%   06/03/2021 Cancer Staging   Staging form: Breast, AJCC 8th Edition - Clinical stage from 06/03/2021: Stage IA (cT1b, cN0, cM0, G2, ER+, PR+, HER2-) - Signed by Nicholas Lose, MD on 06/03/2021 Stage prefix: Initial diagnosis Histologic grading system: 3 grade system    07/23/2021 Surgery   Right lumpectomy: Grade 2 invasive lobular cancer, LCIS, margins negative, 1/3 lymph nodes positive, ER 90%, PR 100%, HER2 negative, Ki-67 5%   08/03/2021 Cancer Staging   Staging form: Breast, AJCC 8th Edition - Pathologic: Stage IA (pT1c, pN1, cM0, G2, ER+, PR+, HER2-) - Signed by Nicholas Lose, MD on 08/03/2021 Stage prefix: Initial diagnosis Histologic grading system: 3 grade system    09/02/2021 - 10/16/2021 Radiation Therapy   Adjuvant radiation     CHIEF COMPLIANT: Follow-up after radiation    INTERVAL HISTORY: Caroline Greer is a 66 y.o. female is here because of recent diagnosis of right  breast cancer. She presents to the clinic for follow-up.  She is complaining of profound radiation dermatitis.  She uses oxygen 24 x 7 and gets around with the help of a walker and a wheelchair.   ALLERGIES:  is allergic to crab [shellfish allergy] and erythromycin.  MEDICATIONS:  Current Outpatient Medications  Medication Sig Dispense Refill   letrozole (FEMARA) 2.5 MG tablet Take 1 tablet (2.5 mg total) by mouth daily. 90 tablet 3   ALPRAZolam (XANAX) 0.5 MG tablet Take 0.5 mg by mouth at bedtime as needed for sleep.     amLODipine (NORVASC) 5 MG tablet Take 1 tablet (5 mg total) by mouth daily. 90 tablet 3   aspirin EC 81 MG tablet Take 81 mg by mouth daily.     atorvastatin (LIPITOR) 10 MG tablet Take 1 tablet (10 mg total) by mouth daily. 90 tablet 3   Budeson-Glycopyrrol-Formoterol (BREZTRI AEROSPHERE) 160-9-4.8 MCG/ACT AERO Inhale 2 puffs into the lungs in the morning and at bedtime. 32.1 g 1   fluticasone (FLONASE) 50 MCG/ACT nasal spray Place 2 sprays into both nostrils daily. If needed will use additional 2 spray at bedtime     ipratropium-albuterol (DUONEB) 0.5-2.5 (3) MG/3ML SOLN Take 3 mLs by nebulization 4 (four) times daily as needed (shortness of breath).     metoprolol tartrate (LOPRESSOR) 25 MG tablet Take 1 tablet (25 mg total) by mouth 2 (two) times daily. 180 tablet 3   Omega-3 Fatty Acids (FISH OIL) 1000 MG CAPS Take 1,000 mg by mouth daily.     OXYGEN Inhale 3 L into the lungs continuous.     polyethylene glycol (  MIRALAX / GLYCOLAX) 17 g packet Take 17 g by mouth daily. (Patient taking differently: Take 17 g by mouth daily as needed for mild constipation or moderate constipation.) 14 each 0   PROAIR HFA 108 (90 Base) MCG/ACT inhaler Inhale 2 puffs into the lungs every 6 (six) hours as needed for wheezing or shortness of breath. 90 day supply 18 g 3   vitamin B-12 (CYANOCOBALAMIN) 500 MCG tablet Take 500 mcg by mouth daily.     No current facility-administered medications  for this visit.    PHYSICAL EXAMINATION: ECOG PERFORMANCE STATUS: 1 - Symptomatic but completely ambulatory  Vitals:   10/14/21 1526  BP: 124/76  Pulse: 76  Resp: 17  Temp: 97.7 F (36.5 C)  SpO2: 96%   Filed Weights   10/14/21 1526  Weight: 130 lb 0.6 oz (59 kg)      LABORATORY DATA:  I have reviewed the data as listed    Latest Ref Rng & Units 07/15/2021    2:35 PM 06/03/2021   12:34 PM 05/13/2020    2:36 PM  CMP  Glucose 70 - 99 mg/dL 90   81   79    BUN 8 - 23 mg/dL <_0 Creatinine 0.44 - 1.00 mg/dL 0.57   0.57   0.76    Sodium 135 - 145 mmol/L 136   138   139    Potassium 3.5 - 5.1 mmol/L 3.4   3.8   4.0    Chloride 98 - 111 mmol/L 95   98   97    CO2 22 - 32 mmol/L 30   33   27    Calcium 8.9 - 10.3 mg/dL 9.4   9.8   9.3    Total Protein 6.5 - 8.1 g/dL  7.6   7.1    Total Bilirubin 0.3 - 1.2 mg/dL  0.5   0.4    Alkaline Phos 38 - 126 U/L  48   72    AST 15 - 41 U/L  15   23    ALT 0 - 44 U/L  14   24      Lab Results  Component Value Date   WBC 12.1 (H) 07/15/2021   HGB 15.1 (H) 07/15/2021   HCT 45.8 07/15/2021   MCV 101.3 (H) 07/15/2021   PLT 304 07/15/2021   NEUTROABS 7.8 (H) 06/03/2021    ASSESSMENT & PLAN:  Malignant neoplasm of upper-outer quadrant of right breast in female, estrogen receptor positive ([place]) 07/23/2021:Right lumpectomy: Grade 2 invasive lobular cancer, LCIS, margins negative, 1/3 lymph nodes positive, ER 90%, PR 100%, HER2 negative, Ki-67 5%   Treatment plan: 1.  Molecular testing will not be done because of severe COPD and performance status issues 2.  Adjuvant radiation therapy 09/02/2021-10/16/2021 3.  Adjuvant antiestrogen therapy to start 10/22/2021  Letrozole counseling: We discussed the risks and benefits of anti-estrogen therapy with aromatase inhibitors. These include but not limited to insomnia, hot flashes, mood changes, vaginal dryness, bone density loss, and weight gain. We strongly believe that the benefits far  outweigh the risks. Patient understands these risks and consented to starting treatment. Planned treatment duration is 5-7 years.  Return to clinic in 3 months for survivorship care plan visit    No orders of the defined types were placed in this encounter.  The patient has a good understanding of the overall plan. she agrees with it. she will call with  any problems that may develop before the next visit here. Total time spent: 30 mins including face to face time and time spent for planning, charting and co-ordination of care   Harriette Ohara, MD 10/14/21    I Gardiner Coins am scribing for Dr. [name]  I have reviewed the above documentation for accuracy and completeness, and I agree with the above.

## 2021-10-09 ENCOUNTER — Ambulatory Visit
Admission: RE | Admit: 2021-10-09 | Discharge: 2021-10-09 | Disposition: A | Discharge status: Home / Self Care | Payer: Medicare Other | Source: Ambulatory Visit | Attending: Radiation Oncology | Admitting: Radiation Oncology

## 2021-10-09 ENCOUNTER — Other Ambulatory Visit: Payer: Self-pay

## 2021-10-09 DIAGNOSIS — Z510 Encounter for antineoplastic radiation therapy: Secondary | ICD-10-CM | POA: Diagnosis not present

## 2021-10-09 LAB — RAD ONC ARIA SESSION SUMMARY
Course Elapsed Days: 38
Plan Fractions Treated to Date: 1
Plan Prescribed Dose Per Fraction: 2 Gy
Plan Total Fractions Prescribed: 6
Plan Total Prescribed Dose: 12 Gy
Reference Point Dosage Given to Date: 52.4 Gy
Reference Point Session Dosage Given: 2 Gy
Session Number: 29

## 2021-10-12 ENCOUNTER — Other Ambulatory Visit: Payer: Self-pay

## 2021-10-12 ENCOUNTER — Ambulatory Visit
Admission: RE | Admit: 2021-10-12 | Discharge: 2021-10-12 | Disposition: A | Discharge status: Home / Self Care | Payer: Medicare Other | Source: Ambulatory Visit | Attending: Radiation Oncology | Admitting: Radiation Oncology

## 2021-10-12 DIAGNOSIS — Z510 Encounter for antineoplastic radiation therapy: Secondary | ICD-10-CM | POA: Diagnosis not present

## 2021-10-12 LAB — RAD ONC ARIA SESSION SUMMARY
Course Elapsed Days: 41
Plan Fractions Treated to Date: 2
Plan Prescribed Dose Per Fraction: 2 Gy
Plan Total Fractions Prescribed: 6
Plan Total Prescribed Dose: 12 Gy
Reference Point Dosage Given to Date: 54.4 Gy
Reference Point Session Dosage Given: 2 Gy
Session Number: 30

## 2021-10-13 ENCOUNTER — Other Ambulatory Visit: Payer: Self-pay

## 2021-10-13 ENCOUNTER — Ambulatory Visit
Admission: RE | Admit: 2021-10-13 | Discharge: 2021-10-13 | Disposition: A | Discharge status: Home / Self Care | Payer: Medicare Other | Source: Ambulatory Visit | Attending: Radiation Oncology | Admitting: Radiation Oncology

## 2021-10-13 DIAGNOSIS — Z510 Encounter for antineoplastic radiation therapy: Secondary | ICD-10-CM | POA: Diagnosis not present

## 2021-10-13 LAB — RAD ONC ARIA SESSION SUMMARY
Course Elapsed Days: 42
Plan Fractions Treated to Date: 3
Plan Prescribed Dose Per Fraction: 2 Gy
Plan Total Fractions Prescribed: 6
Plan Total Prescribed Dose: 12 Gy
Reference Point Dosage Given to Date: 56.4 Gy
Reference Point Session Dosage Given: 2 Gy
Session Number: 31

## 2021-10-14 ENCOUNTER — Inpatient Hospital Stay (HOSPITAL_BASED_OUTPATIENT_CLINIC_OR_DEPARTMENT_OTHER): Payer: Medicare Other | Admitting: Hematology and Oncology

## 2021-10-14 ENCOUNTER — Other Ambulatory Visit: Payer: Self-pay

## 2021-10-14 ENCOUNTER — Ambulatory Visit
Admission: RE | Admit: 2021-10-14 | Discharge: 2021-10-14 | Disposition: A | Discharge status: Home / Self Care | Payer: Medicare Other | Source: Ambulatory Visit | Attending: Radiation Oncology | Admitting: Radiation Oncology

## 2021-10-14 DIAGNOSIS — C50411 Malignant neoplasm of upper-outer quadrant of right female breast: Secondary | ICD-10-CM | POA: Insufficient documentation

## 2021-10-14 DIAGNOSIS — Z510 Encounter for antineoplastic radiation therapy: Secondary | ICD-10-CM | POA: Diagnosis not present

## 2021-10-14 DIAGNOSIS — Z170 Estrogen receptor positive status [ER+]: Secondary | ICD-10-CM | POA: Insufficient documentation

## 2021-10-14 DIAGNOSIS — L598 Other specified disorders of the skin and subcutaneous tissue related to radiation: Secondary | ICD-10-CM | POA: Insufficient documentation

## 2021-10-14 LAB — RAD ONC ARIA SESSION SUMMARY
Course Elapsed Days: 43
Plan Fractions Treated to Date: 4
Plan Prescribed Dose Per Fraction: 2 Gy
Plan Total Fractions Prescribed: 6
Plan Total Prescribed Dose: 12 Gy
Reference Point Dosage Given to Date: 58.4 Gy
Reference Point Session Dosage Given: 2 Gy
Session Number: 32

## 2021-10-14 MED ORDER — LETROZOLE 2.5 MG PO TABS: 2.5000 mg | ORAL_TABLET | Freq: Every day | ORAL | 3 refills | Status: DC

## 2021-10-14 NOTE — Assessment & Plan Note (Addendum)
07/23/2021:Right lumpectomy: Grade 2 invasive lobular cancer, LCIS, margins negative, 1/3 lymph nodes positive, ER 90%, PR 100%, HER2 negative, Ki-67 5%  Treatment plan: 1.  Molecular testing will not be done because of severe COPD and performance status issues 2.  Adjuvant radiation therapy 09/02/2021-10/16/2021 3.  Adjuvant antiestrogen therapy to start 10/22/2021  Letrozole counseling: We discussed the risks and benefits of anti-estrogen therapy with aromatase inhibitors. These include but not limited to insomnia, hot flashes, mood changes, vaginal dryness, bone density loss, and weight gain. We strongly believe that the benefits far outweigh the risks. Patient understands these risks and consented to starting treatment. Planned treatment duration is 5-7 years.  Return to clinic in 3 months for survivorship care plan visit

## 2021-10-15 ENCOUNTER — Encounter: Payer: Self-pay | Admitting: *Deleted

## 2021-10-15 ENCOUNTER — Other Ambulatory Visit: Payer: Self-pay

## 2021-10-15 ENCOUNTER — Ambulatory Visit
Admission: RE | Admit: 2021-10-15 | Discharge: 2021-10-15 | Disposition: A | Discharge status: Home / Self Care | Payer: Medicare Other | Source: Ambulatory Visit | Attending: Radiation Oncology | Admitting: Radiation Oncology

## 2021-10-15 DIAGNOSIS — Z510 Encounter for antineoplastic radiation therapy: Secondary | ICD-10-CM | POA: Diagnosis not present

## 2021-10-15 DIAGNOSIS — C50411 Malignant neoplasm of upper-outer quadrant of right female breast: Secondary | ICD-10-CM

## 2021-10-15 LAB — RAD ONC ARIA SESSION SUMMARY
Course Elapsed Days: 44
Plan Fractions Treated to Date: 5
Plan Prescribed Dose Per Fraction: 2 Gy
Plan Total Fractions Prescribed: 6
Plan Total Prescribed Dose: 12 Gy
Reference Point Dosage Given to Date: 60.4 Gy
Reference Point Session Dosage Given: 2 Gy
Session Number: 33

## 2021-10-16 ENCOUNTER — Ambulatory Visit
Admission: RE | Admit: 2021-10-16 | Discharge: 2021-10-16 | Disposition: A | Discharge status: Home / Self Care | Payer: Medicare Other | Source: Ambulatory Visit | Attending: Radiation Oncology | Admitting: Radiation Oncology

## 2021-10-16 ENCOUNTER — Other Ambulatory Visit: Payer: Self-pay

## 2021-10-16 ENCOUNTER — Encounter: Payer: Self-pay | Admitting: Radiation Oncology

## 2021-10-16 DIAGNOSIS — Z510 Encounter for antineoplastic radiation therapy: Secondary | ICD-10-CM | POA: Diagnosis not present

## 2021-10-16 LAB — RAD ONC ARIA SESSION SUMMARY
Course Elapsed Days: 45
Plan Fractions Treated to Date: 6
Plan Prescribed Dose Per Fraction: 2 Gy
Plan Total Fractions Prescribed: 6
Plan Total Prescribed Dose: 12 Gy
Reference Point Dosage Given to Date: 62.4 Gy
Reference Point Session Dosage Given: 2 Gy
Session Number: 34

## 2021-10-28 ENCOUNTER — Telehealth: Payer: Self-pay | Admitting: Pulmonary Disease

## 2021-10-28 DIAGNOSIS — J9601 Acute respiratory failure with hypoxia: Secondary | ICD-10-CM

## 2021-10-28 NOTE — Telephone Encounter (Signed)
Called patient and spoketo her and was told she was out of oxygen and was unable to be sent anything from [employer]. Patient was last seen in Feb. Updated walk in chart. Patient states she needs oxygen. Called [employer] and let them know. Nothing further needed

## 2021-10-28 NOTE — Telephone Encounter (Signed)
Ms Undrea states that something has gone wrong with her concentrator. [employer] wont bring her any oxygen until the provider calls to decertify her. Ms Volk states that she's down to liters of oxygen, and she needs more. She states that she is afraid she will die without oxygen. Ms Baine states that she just finished 34 treatments of radiation, and she states that she will like this be taken cared of today because has medicaid, and they will cover the cost of it. Her phone number is [phone] and the contact person at [employer] is [phone] that will assist with matter.

## 2021-10-29 ENCOUNTER — Other Ambulatory Visit: Payer: Self-pay

## 2021-10-29 ENCOUNTER — Encounter: Payer: Self-pay | Admitting: Pulmonary Disease

## 2021-10-29 DIAGNOSIS — J9601 Acute respiratory failure with hypoxia: Secondary | ICD-10-CM

## 2021-11-11 ENCOUNTER — Encounter: Payer: Self-pay | Admitting: Radiation Oncology

## 2021-11-11 NOTE — Progress Notes (Incomplete)
  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 During her final weekly treatment check on 10/15/21, the patient reported mild fatigue and denied any other symptoms. Physical exam performed on that same date showed hyperpigmentation changes and some erythema to the skin of the right breast and supraclavicular area.  No moist desquamation or significant dry desquamation appreciated. Patient continued to wear 2L supplemental O2 during treatment.    Plan: The patient will follow-up with radiation oncology in one month .  ________________________________________________ -----------------------------------  Blair Promise, PhD, MD  This document serves as a record of services personally performed by Gery Pray, MD. It was created on his behalf by Roney Mans, a trained medical scribe. The creation of this record is based on the scribe's personal observations and the  provider's statements to them. This document has been checked and approved by the attending provider.

## 2021-11-16 ENCOUNTER — Other Ambulatory Visit: Payer: Self-pay

## 2021-11-16 ENCOUNTER — Ambulatory Visit
Admission: RE | Admit: 2021-11-16 | Discharge: 2021-11-16 | Disposition: A | Discharge status: Home / Self Care | Payer: Medicare Other | Source: Ambulatory Visit | Attending: Radiation Oncology | Admitting: Radiation Oncology

## 2021-11-16 ENCOUNTER — Encounter: Payer: Self-pay | Admitting: Radiation Oncology

## 2021-11-16 DIAGNOSIS — C50411 Malignant neoplasm of upper-outer quadrant of right female breast: Secondary | ICD-10-CM | POA: Diagnosis present

## 2021-11-16 DIAGNOSIS — Z170 Estrogen receptor positive status [ER+]: Secondary | ICD-10-CM | POA: Diagnosis present

## 2021-11-16 HISTORY — DX: Personal history of irradiation: Z92.3

## 2021-11-17 ENCOUNTER — Telehealth: Payer: Self-pay | Admitting: *Deleted

## 2021-11-25 ENCOUNTER — Telehealth: Payer: Self-pay | Admitting: Pulmonary Disease

## 2021-11-25 NOTE — Telephone Encounter (Signed)
Primary Pulmonologist: Icard Last office visit and with whom: 07/02/2021 Icard What do we see them for (pulmonary problems): emphysema, COPD, multiple lung nodules Last OV assessment/plan:  Assessment:    This is a 66 year old female, centrilobular emphysema likely COPD currently managed with triple therapy inhaler.  She is a former smoker recently quit doing well after she has quit smoking  Plan: She has a planned lumpectomy. I believe that she is low risk for perioperative pulmonary complications. She needs to maintain her inhaler use before and after surgery. Continue to refrain from picking back up cigarettes. If she needs any support postoperatively from the pulmonary service will be happy to see her.       Current Outpatient Medications:    ALPRAZolam (XANAX) 0.5 MG tablet, Take 0.5 mg by mouth at bedtime as needed for sleep., Disp: , Rfl:    amLODipine (NORVASC) 5 MG tablet, Take 1 tablet (5 mg total) by mouth daily., Disp: 90 tablet, Rfl: 3   aspirin EC 81 MG tablet, Take 81 mg by mouth daily., Disp: , Rfl:    atorvastatin (LIPITOR) 10 MG tablet, Take 1 tablet (10 mg total) by mouth daily., Disp: 90 tablet, Rfl: 3   benazepril (LOTENSIN) 40 MG tablet, Take 40 mg by mouth daily., Disp: , Rfl:    Budeson-Glycopyrrol-Formoterol (BREZTRI AEROSPHERE) 160-9-4.8 MCG/ACT AERO, Inhale 2 puffs into the lungs in the morning and at bedtime., Disp: 32.1 g, Rfl: 1   ipratropium-albuterol (DUONEB) 0.5-2.5 (3) MG/3ML SOLN, Take 3 mLs by nebulization 4 (four) times daily as needed (shortness of breath)., Disp: , Rfl:    loratadine (CLARITIN) 10 MG tablet, Take 10 mg by mouth daily as needed for allergies., Disp: , Rfl:    metoprolol tartrate (LOPRESSOR) 25 MG tablet, Take 1 tablet (25 mg total) by mouth 2 (two) times daily., Disp: 180 tablet, Rfl: 3   Multiple Vitamins-Minerals (CENTRUM SILVER 50+WOMEN) TABS, Take 1 tablet by mouth daily., Disp: , Rfl:    Omega-3 Fatty Acids (FISH OIL) 1000 MG CAPS,  Take 1,000 mg by mouth daily., Disp: , Rfl:    polyethylene glycol (MIRALAX / GLYCOLAX) 17 g packet, Take 17 g by mouth daily., Disp: 14 each, Rfl: 0   PROAIR HFA 108 (90 Base) MCG/ACT inhaler, Inhale 2 puffs into the lungs every 6 (six) hours as needed for wheezing or shortness of breath. 90 day supply, Disp: 18 g, Rfl: 3   vitamin B-12 (CYANOCOBALAMIN) 500 MCG tablet, Take 500 mcg by mouth daily., Disp: , Rfl:      Garner Nash, DO  Pulmonary Critical Care 07/02/2021 4:03 PM         Patient Instructions by Garner Nash, DO at 07/02/2021 3:45 PM  Author: Garner Nash, DO Author Type: Physician Filed: 07/02/2021  4:01 PM  Note Status: Addendum Mickle Mallory: Cosign Not Required Encounter Date: 07/02/2021  Editor: Garner Nash, DO (Physician)      Prior Versions: 1. Garner Nash, DO (Physician) at 07/02/2021  3:48 PM - Signed    Thank you for visiting Dr. [name] at [facility]. Today we recommend the following:   Stay on breztri Congratulation on quitting smoking    Return in about 6 months (around 12/30/2021) for with APP or Dr. [name].      Please do your part to reduce the spread of COVID-19.          Orthostatic Vitals Recorded in This Encounter   07/02/2021  1548  Patient Position: Sitting  BP Location: Right Arm  Cuff Size: Normal   Instructions    Return in about 6 months (around 12/30/2021) for with APP or Dr. [name].  Thank you for visiting Dr. [name] at [facility]. Today we recommend the following:   Stay on breztri Congratulation on quitting smoking    Return in about 6 months (around 12/30/2021) for with APP or Dr. [name].       Was appointment offered to patient (explain)?  11/30/2021 at 10 am, advised to arrive by 9:45 am for check in.   Reason for call: SOB for 1 month, went pcp and given a shot and an antibiotic since last Friday and seem to help a little.  Now legs/feet started to swell.  Elevated them and now they are back to  normal size.  PCP called in a fluid pill.  PCP and Radiologist listened to lungs and said they sounded fine.  She would like to try another inhaler since she is having increased sob and having side effects (thrush, despite rinsing, brushing, ect). Spoke with SG and she felt she needed an OV.  Scheduled OV with BI.  Nothing further needed.  (examples of things to ask: : When did symptoms start? Fever? Cough? Productive? Color to sputum? More sputum than usual? Wheezing? Have you needed increased oxygen? Are you taking your respiratory medications? What over the counter measures have you tried?)  Allergies  Allergen Reactions   Crab [Shellfish Allergy] Anaphylaxis and Swelling    Dungeness crab   Erythromycin Other (See Comments)    convulsion     There is no immunization history on file for this patient.

## 2021-11-30 ENCOUNTER — Encounter: Payer: Self-pay | Admitting: Pulmonary Disease

## 2021-11-30 ENCOUNTER — Ambulatory Visit (INDEPENDENT_AMBULATORY_CARE_PROVIDER_SITE_OTHER): Payer: Medicare Other | Admitting: Pulmonary Disease

## 2021-11-30 VITALS — BP 118/72 | Temp 98.0°F | Ht 70.75 in | Wt 131.0 lb

## 2021-11-30 DIAGNOSIS — J432 Centrilobular emphysema: Secondary | ICD-10-CM | POA: Diagnosis not present

## 2021-11-30 DIAGNOSIS — R918 Other nonspecific abnormal finding of lung field: Secondary | ICD-10-CM

## 2021-11-30 DIAGNOSIS — J449 Chronic obstructive pulmonary disease, unspecified: Secondary | ICD-10-CM | POA: Diagnosis not present

## 2021-11-30 DIAGNOSIS — J9611 Chronic respiratory failure with hypoxia: Secondary | ICD-10-CM

## 2021-11-30 MED ORDER — ALBUTEROL SULFATE HFA 108 (90 BASE) MCG/ACT IN AERS: 2.0000 | INHALATION_SPRAY | Freq: Four times a day (QID) | RESPIRATORY_TRACT | 6 refills | Status: DC | PRN

## 2021-11-30 NOTE — Progress Notes (Signed)
Synopsis: Referred in September 2020 for shortness of breath by [name], MD  Subjective:   PATIENT ID: Caroline Greer GENDER: female DOB: 19-Jan-1956, MRN: [id number]  Chief Complaint  Patient presents with   Follow-up    Breztri not working for     Patient with a past medical history of hypertension, problem list includes COPD.  No prior PFTs in epic.  Patient was admitted to the hospital on 01/05/2019 for COPD exacerbation.  Patient was discharged from the hospital on 01/07/2019.  Recommending outpatient pulmonary follow-up.  Admitted to the hospital on 814 with medical history of hypertension depression tobacco abuse and COPD worsening dyspnea over the past 3 days.  Started after she was mowing her grass.  Included symptoms of wheezing coughing no chest pain and no fevers.  COVID negative.  Was found to be hypoxemic oxygen saturations in the 90s on 6 L nasal cannula tachycardic.  EKG with right axis deviation no ST changes negative T waves in V1 through 4.  Patient was treated with bronchodilators systemic steroids and discharged on 3 L nasal cannula.  Patient was encouraged to use quit smoking.  Today, overall in office patient has been doing well since her hospitalization.  She has been slowly increasing her exercise tolerance.  She has been mowing her grass.  She currently lives alone.  She enjoys fishing.  She plans on going this weekend.  Unfortunately she is still smoking.  She is been trying to cut down but finds that it is very difficult.  She has been working with her insurance to help cover cost of inhalers however most inhalers have been very expensive.  She has been relying on receiving samples from her PCP.  We will also help her receive samples.  If needed we may be able to apply for financial assistance through manufacturer.  She does have daily dyspnea on exertion cough and shortness of breath.  She uses her nebulizer regularly at home.  Denies weight loss fevers or  hemoptysis.  OV 03/07/2019: Patient here today for COPD follow-up.  At last office visit patient was started on Trelegy inhaler.  Patient has been doing very well on Trelegy inhaler after start of the medication last office visit.  She needs refills of this medication today would like to have samples.  Also given refill of her albuterol inhaler.  Today we discussed her CT scan results which revealed multiple bilateral pulmonary nodules all of which were stable and benign in comparison to previous.  She does have evidence of emphysema.  Otherwise her dyspnea on exertion is doing well.  Delay in scheduling of her PFTs due to [place].  I think we can push this off until her repeat follow-up with [place] in 6 months.  Otherwise patient denies dyspnea on exertion nausea vomiting diarrhea chest pain.  No hemoptysis.  OV 07/18/2019: Patient seen today for follow-up of COPD.  Also has pulmonary nodules.  Patient last seen in the office by Derl Barrow, NP.  Was unable to get PFTs completed due to rescheduling due to Covid.  At the time had side effects from Trelegy.  This was stopped trialed off of inhaled steroids started on Stiolto.  Unfortunately at this time was still smoking.  Patient was also referred to lung cancer screening program and offered smoking cessation counseling.  Currently managed with anoro.  She feels like she was breathing much better on the Trelegy.  She comes today hypoxemic in the room sats in the 70s.  She did not bring her home oxygen with her into the building.  We put her on oxygen here today she feels breathing much better at this time.  Of note she was able to quit smoking.  She did meet with pharmacy x2 since last office visit.  She does describe ice eating today and urged to eat ice.  She has not had a colonoscopy before.  She denies dark stools.  OV 07/02/2021: Here today for follow-up regarding COPD as well as clearance for surgery.  Last seen in the office in February 2021.  She was still  smoking at the time.Patient was referred for pulmonary clearance with plans for the lumpectomy by [facility] surgery.  Patient had mammogram in December followed by ultrasound-guided needle biopsy confirming mammary carcinoma of the breast.  From a respiratory standpoint she is doing well.  Still using her Breztri inhaler.  OV 11/30/2021: Patient just completed radiation treatments for her breast cancer.  This was completed in May.  She had a flareup of COPD treated with antibiotics and steroids recently with primary care.  She still has antibiotics to complete this week.  She does feel like she is breathing better.  She is not entirely sure that is just related to the [facility] inhaler.  She said she has been using her inhalers but since she completed radiation she is feeling more short of breath.  She does feel like she is getting better after being treated with antibiotics by her primary care provider.  I encouraged her to stay on her triple therapy inhaler regimen.    Past Medical History:  Diagnosis Date   Acute respiratory failure with hypoxia ([place]) 09/30/2018   Breast cancer ([place])    Constipation    COPD exacerbation ([place]) 01/05/2019   Depression with anxiety 09/30/2018   Dyspnea    Essential hypertension 09/30/2018   History of radiation therapy    Right breast- 09/01/21-10/16/21- Dr. Gery Pray   Hypertension    Tobacco dependence due to cigarettes 09/30/2018     Family History  Problem Relation Age of Onset   COPD Mother    Throat cancer Maternal Grandmother    Prostate cancer Other    Prostate cancer Other    Prostate cancer Other    Esophageal cancer Maternal Aunt      Past Surgical History:  Procedure Laterality Date   BREAST LUMPECTOMY WITH RADIOACTIVE SEED AND SENTINEL LYMPH NODE BIOPSY Right 07/23/2021   Procedure: RIGHT BREAST LUMPECTOMY WITH RADIOACTIVE SEED AND SENTINEL LYMPH NODE BIOPSY;  Surgeon: Jovita Kussmaul, MD;  Location: [facility];  Service: General;   Laterality: Right;   HAND SURGERY Right 1997   Hand Fracture (right thumb & hand)   LAPAROTOMY  1974   RIGHT/LEFT HEART CATH AND CORONARY ANGIOGRAPHY N/A 10/02/2019   Procedure: RIGHT/LEFT HEART CATH AND CORONARY ANGIOGRAPHY;  Surgeon: Sherren Mocha, MD;  Location: [facility];  Service: Cardiovascular;  Laterality: N/A;    Social History   Socioeconomic History   Marital status: Single    Spouse name: Not on file   Number of children: Not on file   Years of education: Not on file   Highest education level: Not on file  Occupational History   Not on file  Tobacco Use   Smoking status: Former    Packs/day: 0.80    Years: 43.00    Total pack years: 34.40    Types: Cigarettes   Smokeless tobacco: Never   Tobacco comments:  Quit smoking 2 - 3 months a go 11/30/21  Vaping Use   Vaping Use: Never used  Substance and Sexual Activity   Alcohol use: Yes    Comment: gets 6 beers on a weekend and drinks them   Drug use: Not Currently    Types: Marijuana    Comment: tried to smoke some about a month ago   Sexual activity: Not on file  Other Topics Concern   Not on file  Social History Narrative   Not on file   Social Determinants of Health   Financial Resource Strain: Not on file  Food Insecurity: Not on file  Transportation Needs: Not on file  Physical Activity: Not on file  Stress: Not on file  Social Connections: Not on file  Intimate Partner Violence: Not on file     Allergies  Allergen Reactions   Crab [Shellfish Allergy] Anaphylaxis and Swelling    Dungeness crab   Erythromycin Other (See Comments)    convulsion     Outpatient Medications Prior to Visit  Medication Sig Dispense Refill   ALPRAZolam (XANAX) 0.5 MG tablet Take 0.5 mg by mouth at bedtime as needed for sleep.     amLODipine (NORVASC) 5 MG tablet Take 1 tablet (5 mg total) by mouth daily. (Patient taking differently: Take 10 mg by mouth daily.) 90 tablet 3   aspirin EC 81 MG tablet Take  81 mg by mouth daily.     atorvastatin (LIPITOR) 10 MG tablet Take 1 tablet (10 mg total) by mouth daily. 90 tablet 3   Budeson-Glycopyrrol-Formoterol (BREZTRI AEROSPHERE) 160-9-4.8 MCG/ACT AERO Inhale 2 puffs into the lungs in the morning and at bedtime. 32.1 g 1   fluticasone (FLONASE) 50 MCG/ACT nasal spray Place 2 sprays into both nostrils daily. If needed will use additional 2 spray at bedtime     ipratropium-albuterol (DUONEB) 0.5-2.5 (3) MG/3ML SOLN Take 3 mLs by nebulization 4 (four) times daily as needed (shortness of breath).     metoprolol tartrate (LOPRESSOR) 25 MG tablet Take 1 tablet (25 mg total) by mouth 2 (two) times daily. 180 tablet 3   Omega-3 Fatty Acids (FISH OIL) 1000 MG CAPS Take 1,000 mg by mouth daily.     OXYGEN Inhale 3 L into the lungs continuous.     polyethylene glycol (MIRALAX / GLYCOLAX) 17 g packet Take 17 g by mouth daily. (Patient taking differently: Take 17 g by mouth daily as needed for mild constipation or moderate constipation.) 14 each 0   PROAIR HFA 108 (90 Base) MCG/ACT inhaler Inhale 2 puffs into the lungs every 6 (six) hours as needed for wheezing or shortness of breath. 90 day supply 18 g 3   vitamin B-12 (CYANOCOBALAMIN) 500 MCG tablet Take 500 mcg by mouth daily.     letrozole (FEMARA) 2.5 MG tablet Take 1 tablet (2.5 mg total) by mouth daily. (Patient not taking: Reported on 11/30/2021) 90 tablet 3   No facility-administered medications prior to visit.    Review of Systems  Constitutional:  Positive for malaise/fatigue. Negative for chills, fever and weight loss.  HENT:  Negative for hearing loss, sore throat and tinnitus.   Eyes:  Negative for blurred vision and double vision.  Respiratory:  Positive for cough and shortness of breath. Negative for hemoptysis, sputum production, wheezing and stridor.   Cardiovascular:  Negative for chest pain, palpitations, orthopnea, leg swelling and PND.  Gastrointestinal:  Negative for abdominal pain,  constipation, diarrhea, heartburn, nausea and vomiting.  Genitourinary:  Negative for dysuria, hematuria and urgency.  Musculoskeletal:  Negative for joint pain and myalgias.  Skin:  Negative for itching and rash.  Neurological:  Negative for dizziness, tingling, weakness and headaches.  Endo/Heme/Allergies:  Negative for environmental allergies. Does not bruise/bleed easily.  Psychiatric/Behavioral:  Negative for depression. The patient is not nervous/anxious and does not have insomnia.   All other systems reviewed and are negative.    Objective:  Physical Exam Vitals reviewed.  Constitutional:      General: She is not in acute distress.    Appearance: She is well-developed.  HENT:     Head: Normocephalic and atraumatic.     Mouth/Throat:     Pharynx: No oropharyngeal exudate.  Eyes:     Conjunctiva/sclera: Conjunctivae normal.     Pupils: Pupils are equal, round, and reactive to light.  Neck:     Vascular: No JVD.     Trachea: No tracheal deviation.     Comments: Loss of supraclavicular fat Cardiovascular:     Rate and Rhythm: Normal rate and regular rhythm.     Heart sounds: S1 normal and S2 normal.     Comments: Distant heart tones Pulmonary:     Effort: No tachypnea or accessory muscle usage.     Breath sounds: No stridor. Decreased breath sounds (throughout all lung fields) present. No wheezing, rhonchi or rales.     Comments: Diminished breath sounds bilaterally Abdominal:     General: There is no distension.     Palpations: Abdomen is soft.     Tenderness: There is no abdominal tenderness.  Musculoskeletal:        General: Deformity (muscle wasting ) present.  Skin:    General: Skin is warm and dry.     Capillary Refill: Capillary refill takes less than 2 seconds.     Findings: No rash.  Neurological:     Mental Status: She is alert and oriented to person, place, and time.  Psychiatric:        Behavior: Behavior normal.      Vitals:   11/30/21 1006  11/30/21 1012  BP: 118/72   Temp: 98 F (36.7 C)   TempSrc: Oral   SpO2: 90% 91%  Weight: 131 lb (59.4 kg)   Height: 5' 10.75" (1.797 m)    91% on RA BMI Readings from Last 3 Encounters:  11/30/21 18.40 kg/m  10/14/21 18.66 kg/m  08/24/21 18.81 kg/m   Wt Readings from Last 3 Encounters:  11/30/21 131 lb (59.4 kg)  10/14/21 130 lb 0.6 oz (59 kg)  08/24/21 131 lb 2 oz (59.5 kg)     CBC    Component Value Date/Time   WBC 12.1 (H) 07/15/2021 1435   RBC 4.52 07/15/2021 1435   HGB 15.1 (H) 07/15/2021 1435   HGB 15.6 (H) 06/03/2021 1234   HGB 17.3 (H) 09/26/2019 1650   HCT 45.8 07/15/2021 1435   HCT 52.2 (H) 09/26/2019 1650   PLT 304 07/15/2021 1435   PLT 247 06/03/2021 1234   PLT 236 09/26/2019 1650   MCV 101.3 (H) 07/15/2021 1435   MCV 88 09/26/2019 1650   MCH 33.4 07/15/2021 1435   MCHC 33.0 07/15/2021 1435   RDW 13.2 07/15/2021 1435   RDW 19.4 (H) 09/26/2019 1650   LYMPHSABS 2.5 06/03/2021 1234   MONOABS 0.7 06/03/2021 1234   EOSABS 0.2 06/03/2021 1234   BASOSABS 0.0 06/03/2021 1234    Chest Imaging: CXR - 8/14 Cardiomegaly, evidence of emphysema  The patient's  images have been independently reviewed by me.   09/30/2018 CT chest: Multiple bilateral pulmonary nodules largest 6 mm the right upper lobe recommending noncontrast CT follow-up 3 to 6 months. The patient's images have been independently reviewed by me.    CT chest 02/22/2019: Previously noted pulmonary nodules decreased in size considered benign. Diffuse bronchial wall thickening consistent with COPD history. Three-vessel coronary disease. The patient's images have been independently reviewed by me.    Pulmonary Functions Testing Results:     No data to display          FeNO: None   Pathology: None   05/20/2021: Breast, right, needle core biopsy, 11 o'clock 8cmfn - INVASIVE MAMMARY CARCINOMA, GRADE 2, INVOLVING FOUR OF FOUR BIOPSY FRAGMENTS. - TUMOR RANGES TO 0.8 CM IN MAXIMUM  EXTENT  Echocardiogram: None   Heart Catheterization: None     Assessment & Plan:     ICD-10-CM   1. Centrilobular emphysema ([place])  J43.2     2. Chronic obstructive pulmonary disease, unspecified COPD type ([place])  J44.9     3. Chronic hypoxemic respiratory failure ([facility])  J96.11       Assessment:   This is a 66 year old female, centrilobular emphysema, likely advanced COPD, oxygen dependent, former smoker.  She was in a lung cancer screening program.  But recently diagnosed with a breast malignancy and undergoing radiation treatments.  Plan: She has completed all of her radiation, I do think that she may have some increased shortness of breath related to getting breast radiation and its effect on the lung. We will have a repeat CT scan of the chest in 6 months to look for any radiation-induced changes. She had a recent exacerbation of COPD completing a course of antibiotics encouraged her to complete this. We talked about her inhalers today in detail.  I do think she needs to stay on triple therapy inhaler and Breztri I think is the best option because she does not want to use any dry powders. She is okay with staying on Breztri at this time. We also talked about potential start of azithromycin 250 mg Monday Wednesday Friday. But also explained that we would need to obtain the ECG for evaluation of her heart rhythm before consideration she has decided to hold off on starting this medication at this time. Working to see how she does over the coming weeks and she is going to let us know.  We will try to work with patient's DME to get her a new portable oxygen concentrator.  She has 1 but the battery does not stay charged and she has to leave it plugged into her car when she drives.      Current Outpatient Medications:    ALPRAZolam (XANAX) 0.5 MG tablet, Take 0.5 mg by mouth at bedtime as needed for sleep., Disp: , Rfl:    amLODipine (NORVASC) 5 MG tablet, Take 1 tablet (5 mg total)  by mouth daily. (Patient taking differently: Take 10 mg by mouth daily.), Disp: 90 tablet, Rfl: 3   aspirin EC 81 MG tablet, Take 81 mg by mouth daily., Disp: , Rfl:    atorvastatin (LIPITOR) 10 MG tablet, Take 1 tablet (10 mg total) by mouth daily., Disp: 90 tablet, Rfl: 3   Budeson-Glycopyrrol-Formoterol (BREZTRI AEROSPHERE) 160-9-4.8 MCG/ACT AERO, Inhale 2 puffs into the lungs in the morning and at bedtime., Disp: 32.1 g, Rfl: 1   fluticasone (FLONASE) 50 MCG/ACT nasal spray, Place 2 sprays into both nostrils daily. If needed will  use additional 2 spray at bedtime, Disp: , Rfl:    ipratropium-albuterol (DUONEB) 0.5-2.5 (3) MG/3ML SOLN, Take 3 mLs by nebulization 4 (four) times daily as needed (shortness of breath)., Disp: , Rfl:    metoprolol tartrate (LOPRESSOR) 25 MG tablet, Take 1 tablet (25 mg total) by mouth 2 (two) times daily., Disp: 180 tablet, Rfl: 3   Omega-3 Fatty Acids (FISH OIL) 1000 MG CAPS, Take 1,000 mg by mouth daily., Disp: , Rfl:    OXYGEN, Inhale 3 L into the lungs continuous., Disp: , Rfl:    polyethylene glycol (MIRALAX / GLYCOLAX) 17 g packet, Take 17 g by mouth daily. (Patient taking differently: Take 17 g by mouth daily as needed for mild constipation or moderate constipation.), Disp: 14 each, Rfl: 0   PROAIR HFA 108 (90 Base) MCG/ACT inhaler, Inhale 2 puffs into the lungs every 6 (six) hours as needed for wheezing or shortness of breath. 90 day supply, Disp: 18 g, Rfl: 3   vitamin B-12 (CYANOCOBALAMIN) 500 MCG tablet, Take 500 mcg by mouth daily., Disp: , Rfl:    letrozole (FEMARA) 2.5 MG tablet, Take 1 tablet (2.5 mg total) by mouth daily. (Patient not taking: Reported on 11/30/2021), Disp: 90 tablet, Rfl: 3   Garner Nash, DO [facility] 11/30/2021 10:19 AM

## 2021-11-30 NOTE — Patient Instructions (Signed)
Thank you for visiting Dr. [name] at [facility]. Today we recommend the following:  Continue breztri New prescription for albuterol refills   Return in about 3 months (around 03/02/2022) for with APP or Dr. [name].    Please do your part to reduce the spread of COVID-19.

## 2021-11-30 NOTE — Addendum Note (Signed)
Addended by: Gavin Potters R on: 11/30/2021 11:44 AM   Modules accepted: Orders

## 2021-12-03 ENCOUNTER — Other Ambulatory Visit: Payer: Self-pay | Admitting: *Deleted

## 2021-12-03 MED ORDER — ANASTROZOLE 1 MG PO TABS: 1.0000 mg | ORAL_TABLET | Freq: Every day | ORAL | 1 refills | Status: DC

## 2021-12-03 NOTE — Progress Notes (Signed)
Received call from pt stating she has stopped Letrozole x several weeks and symptoms of insomnia, muscle pain, and increased urination at night has resolved.  Verbal orders received from MD for pt to change therapy to Anastrozole 1 mg tablet p.o daily and to f/u in office in 1 month to assess tolerance.  Pt educated and verbalized understanding. Prescription sent to pharmacy on file.

## 2021-12-09 ENCOUNTER — Emergency Department (HOSPITAL_COMMUNITY): Payer: Medicare Other

## 2021-12-09 ENCOUNTER — Inpatient Hospital Stay (HOSPITAL_COMMUNITY)
Admission: EM | Admit: 2021-12-09 | Discharge: 2021-12-15 | DRG: 190 | Disposition: A | Discharge status: Home / Self Care | Payer: Medicare Other | Attending: Internal Medicine | Admitting: Internal Medicine

## 2021-12-09 DIAGNOSIS — Z885 Allergy status to narcotic agent status: Secondary | ICD-10-CM

## 2021-12-09 DIAGNOSIS — E876 Hypokalemia: Secondary | ICD-10-CM | POA: Diagnosis not present

## 2021-12-09 DIAGNOSIS — Z681 Body mass index (BMI) 19 or less, adult: Secondary | ICD-10-CM

## 2021-12-09 DIAGNOSIS — J441 Chronic obstructive pulmonary disease with (acute) exacerbation: Secondary | ICD-10-CM | POA: Diagnosis not present

## 2021-12-09 DIAGNOSIS — Z7982 Long term (current) use of aspirin: Secondary | ICD-10-CM

## 2021-12-09 DIAGNOSIS — Z20822 Contact with and (suspected) exposure to covid-19: Secondary | ICD-10-CM | POA: Diagnosis present

## 2021-12-09 DIAGNOSIS — J90 Pleural effusion, not elsewhere classified: Secondary | ICD-10-CM | POA: Diagnosis present

## 2021-12-09 DIAGNOSIS — Z825 Family history of asthma and other chronic lower respiratory diseases: Secondary | ICD-10-CM

## 2021-12-09 DIAGNOSIS — Z170 Estrogen receptor positive status [ER+]: Secondary | ICD-10-CM

## 2021-12-09 DIAGNOSIS — F32A Depression, unspecified: Secondary | ICD-10-CM | POA: Diagnosis present

## 2021-12-09 DIAGNOSIS — Z91013 Allergy to seafood: Secondary | ICD-10-CM

## 2021-12-09 DIAGNOSIS — Z9981 Dependence on supplemental oxygen: Secondary | ICD-10-CM

## 2021-12-09 DIAGNOSIS — R0902 Hypoxemia: Secondary | ICD-10-CM | POA: Diagnosis not present

## 2021-12-09 DIAGNOSIS — J9611 Chronic respiratory failure with hypoxia: Secondary | ICD-10-CM

## 2021-12-09 DIAGNOSIS — F419 Anxiety disorder, unspecified: Secondary | ICD-10-CM | POA: Diagnosis present

## 2021-12-09 DIAGNOSIS — Y92239 Unspecified place in hospital as the place of occurrence of the external cause: Secondary | ICD-10-CM | POA: Diagnosis not present

## 2021-12-09 DIAGNOSIS — T502X5A Adverse effect of carbonic-anhydrase inhibitors, benzothiadiazides and other diuretics, initial encounter: Secondary | ICD-10-CM | POA: Diagnosis not present

## 2021-12-09 DIAGNOSIS — E440 Moderate protein-calorie malnutrition: Secondary | ICD-10-CM | POA: Insufficient documentation

## 2021-12-09 DIAGNOSIS — Z888 Allergy status to other drugs, medicaments and biological substances status: Secondary | ICD-10-CM

## 2021-12-09 DIAGNOSIS — I10 Essential (primary) hypertension: Secondary | ICD-10-CM | POA: Diagnosis present

## 2021-12-09 DIAGNOSIS — Z808 Family history of malignant neoplasm of other organs or systems: Secondary | ICD-10-CM

## 2021-12-09 DIAGNOSIS — J9621 Acute and chronic respiratory failure with hypoxia: Secondary | ICD-10-CM

## 2021-12-09 DIAGNOSIS — Z800 Family history of malignant neoplasm of digestive organs: Secondary | ICD-10-CM

## 2021-12-09 DIAGNOSIS — Z87891 Personal history of nicotine dependence: Secondary | ICD-10-CM

## 2021-12-09 DIAGNOSIS — C50411 Malignant neoplasm of upper-outer quadrant of right female breast: Secondary | ICD-10-CM | POA: Diagnosis present

## 2021-12-09 DIAGNOSIS — Z7951 Long term (current) use of inhaled steroids: Secondary | ICD-10-CM

## 2021-12-09 LAB — CBC WITH DIFFERENTIAL/PLATELET
Abs Immature Granulocytes: 0.02 10*3/uL (ref 0.00–0.07)
Basophils Absolute: 0 10*3/uL (ref 0.0–0.1)
Basophils Relative: 1 %
Eosinophils Absolute: 0.2 10*3/uL (ref 0.0–0.5)
Eosinophils Relative: 2 %
HCT: 51.5 % — ABNORMAL HIGH (ref 36.0–46.0)
Hemoglobin: 16.8 g/dL — ABNORMAL HIGH (ref 12.0–15.0)
Immature Granulocytes: 0 %
Lymphocytes Relative: 12 %
Lymphs Abs: 1 10*3/uL (ref 0.7–4.0)
MCH: 32.4 pg (ref 26.0–34.0)
MCHC: 32.6 g/dL (ref 30.0–36.0)
MCV: 99.2 fL (ref 80.0–100.0)
Monocytes Absolute: 0.6 10*3/uL (ref 0.1–1.0)
Monocytes Relative: 7 %
Neutro Abs: 6.8 10*3/uL (ref 1.7–7.7)
Neutrophils Relative %: 78 %
Platelets: 252 10*3/uL (ref 150–400)
RBC: 5.19 MIL/uL — ABNORMAL HIGH (ref 3.87–5.11)
RDW: 13.7 % (ref 11.5–15.5)
WBC: 8.7 10*3/uL (ref 4.0–10.5)
nRBC: 0 % (ref 0.0–0.2)

## 2021-12-09 LAB — COMPREHENSIVE METABOLIC PANEL
ALT: 15 U/L (ref 0–44)
AST: 16 U/L (ref 15–41)
Albumin: 3.7 g/dL (ref 3.5–5.0)
Alkaline Phosphatase: 58 U/L (ref 38–126)
Anion gap: 10 (ref 5–15)
BUN: 17 mg/dL (ref 8–23)
CO2: 32 mmol/L (ref 22–32)
Calcium: 9.1 mg/dL (ref 8.9–10.3)
Chloride: 96 mmol/L — ABNORMAL LOW (ref 98–111)
Creatinine, Ser: 0.69 mg/dL (ref 0.44–1.00)
GFR, Estimated: >60 mL/min (ref >60)
Glucose, Bld: 109 mg/dL — ABNORMAL HIGH (ref 70–99)
Potassium: 3.9 mmol/L (ref 3.5–5.1)
Sodium: 138 mmol/L (ref 135–145)
Total Bilirubin: 0.3 mg/dL (ref 0.3–1.2)
Total Protein: 7.5 g/dL (ref 6.5–8.1)

## 2021-12-09 LAB — D-DIMER, QUANTITATIVE: D-Dimer, Quant: 0.94 ug/mL-FEU — ABNORMAL HIGH (ref 0.00–0.50)

## 2021-12-09 LAB — TROPONIN I (HIGH SENSITIVITY)
Troponin I (High Sensitivity): 5 ng/L (ref <18)
Troponin I (High Sensitivity): 6 ng/L (ref <18)

## 2021-12-09 LAB — RESP PANEL BY RT-PCR (FLU A&B, COVID) ARPGX2
Influenza A by PCR: NEGATIVE
Influenza B by PCR: NEGATIVE
SARS Coronavirus 2 by RT PCR: NEGATIVE

## 2021-12-09 MED ORDER — IOHEXOL 350 MG/ML SOLN
65.0000 mL | Freq: Once | INTRAVENOUS | Status: AC | PRN
  Administered 2021-12-09: 65 mL via INTRAVENOUS

## 2021-12-09 MED ORDER — SODIUM CHLORIDE 0.9 % IV SOLN: 500.0000 mg | Freq: Once | INTRAVENOUS | Status: DC

## 2021-12-09 MED ORDER — ALBUTEROL SULFATE (2.5 MG/3ML) 0.083% IN NEBU
5.0000 mg | INHALATION_SOLUTION | Freq: Once | RESPIRATORY_TRACT | Status: AC
  Administered 2021-12-09: 5 mg via RESPIRATORY_TRACT
  Filled 2021-12-09: qty 6

## 2021-12-09 MED ORDER — IPRATROPIUM BROMIDE 0.02 % IN SOLN
0.5000 mg | Freq: Once | RESPIRATORY_TRACT | Status: AC
Start: 2021-12-09 — End: 2021-12-09
  Administered 2021-12-09: 0.5 mg via RESPIRATORY_TRACT
  Filled 2021-12-09: qty 2.5

## 2021-12-09 MED ORDER — SODIUM CHLORIDE 0.9 % IV SOLN
2.0000 g | Freq: Once | INTRAVENOUS | Status: AC
  Administered 2021-12-09: 2 g via INTRAVENOUS
  Filled 2021-12-09: qty 20

## 2021-12-09 NOTE — ED Notes (Signed)
Pt provided with crackers and gingerale

## 2021-12-09 NOTE — ED Triage Notes (Signed)
Pt here from home for shob x3 weeks. Pt is on 3L O2 at all times, but has been using 4L since feeling shob. Pt states she has progressively been getting weaker. Pt has breast CA and just finished radiation. Pt has increased work of breathing in triage, difficulty speaking in full sentences.

## 2021-12-09 NOTE — ED Provider Notes (Signed)
[facility] EMERGENCY DEPARTMENT Provider Note   CSN: [id number] Arrival date & time: 12/09/21  1734     History  Chief Complaint  Patient presents with   Shortness of Breath    Caroline Greer is a 66 y.o. female.   Shortness of Breath Patient presents with shortness of breath.  Has been feeling short of breath around the last 3 weeks.  He is on chronic oxygen at home but has had to increase it up to 4 L from his O2.  Has been feeling weak generally.  Difficulty with ambulation.  Thinks some of it may be related to some of the medicine she has been taking.  Occasional cough without much sputum production.  Had sats in the 60s at home.  Sats of 81% on 4 L here. Previous history of COPD and breast cancer.    Past Medical History:  Diagnosis Date   Acute respiratory failure with hypoxia ([place]) 09/30/2018   Breast cancer ([place])    Constipation    COPD exacerbation ([place]) 01/05/2019   Depression with anxiety 09/30/2018   Dyspnea    Essential hypertension 09/30/2018   History of radiation therapy    Right breast- 09/01/21-10/16/21- Dr. Gery Pray   Hypertension    Tobacco dependence due to cigarettes 09/30/2018    Home Medications Prior to Admission medications   Medication Sig Start Date End Date Taking? Authorizing Provider  albuterol (VENTOLIN HFA) 108 (90 Base) MCG/ACT inhaler Inhale 2 puffs into the lungs every 6 (six) hours as needed for wheezing or shortness of breath. 11/30/21   Icard, Octavio Graves, DO  ALPRAZolam Duanne Moron) 0.5 MG tablet Take 0.5 mg by mouth at bedtime as needed for sleep. 10/31/18   [provider]  amLODipine (NORVASC) 5 MG tablet Take 1 tablet (5 mg total) by mouth daily. Patient taking differently: Take 10 mg by mouth daily. 06/12/21   Loel Dubonnet, NP  anastrozole (ARIMIDEX) 1 MG tablet Take 1 tablet (1 mg total) by mouth daily. 12/03/21   Nicholas Lose, MD  aspirin EC 81 MG tablet Take 81 mg by mouth daily.    [provider]  atorvastatin (LIPITOR) 10 MG tablet Take 1 tablet (10 mg total) by mouth daily. 06/26/21   Sueanne Margarita, MD  Budeson-Glycopyrrol-Formoterol (BREZTRI AEROSPHERE) 160-9-4.8 MCG/ACT AERO Inhale 2 puffs into the lungs in the morning and at bedtime. 03/05/20   Icard, Octavio Graves, DO  fluticasone (FLONASE) 50 MCG/ACT nasal spray Place 2 sprays into both nostrils daily. If needed will use additional 2 spray at bedtime    [provider]  ipratropium-albuterol (DUONEB) 0.5-2.5 (3) MG/3ML SOLN Take 3 mLs by nebulization 4 (four) times daily as needed (shortness of breath). 07/04/19   [provider]  metoprolol tartrate (LOPRESSOR) 25 MG tablet Take 1 tablet (25 mg total) by mouth 2 (two) times daily. 06/12/21   Loel Dubonnet, NP  Omega-3 Fatty Acids (FISH OIL) 1000 MG CAPS Take 1,000 mg by mouth daily.    [provider]  OXYGEN Inhale 3 L into the lungs continuous.    [provider]  polyethylene glycol (MIRALAX / GLYCOLAX) 17 g packet Take 17 g by mouth daily. Patient taking differently: Take 17 g by mouth daily as needed for mild constipation or moderate constipation. 01/07/19   Arrien, Jimmy Picket, MD  PROAIR HFA [phone] Base) MCG/ACT inhaler Inhale 2 puffs into the lungs every 6 (six) hours as needed for wheezing or shortness  of breath. 90 day supply 10/09/19   Icard, Leory Plowman L, DO  vitamin B-12 (CYANOCOBALAMIN) 500 MCG tablet Take 500 mcg by mouth daily.    [provider]      Allergies    Otho Darner allergy] and Erythromycin    Review of Systems   Review of Systems  Respiratory:  Positive for shortness of breath.     Physical Exam Updated Vital Signs BP 123/62   Pulse 85   Temp 98.1 F (36.7 C) (Oral)   Resp (!) 22   SpO2 96%  Physical Exam Vitals and nursing note reviewed.  HENT:     Head: Normocephalic.  Cardiovascular:     Rate and Rhythm: Normal rate.  Pulmonary:     Comments: Decreased breath sounds  throughout.  No focal rales or rhonchi.  May have mild wheezes. Chest:     Chest wall: No tenderness.  Abdominal:     Tenderness: There is no abdominal tenderness.  Musculoskeletal:     Right lower leg: No edema.     Left lower leg: No edema.  Neurological:     Mental Status: She is alert.     ED Results / Procedures / Treatments   Labs (all labs ordered are listed, but only abnormal results are displayed) Labs Reviewed  COMPREHENSIVE METABOLIC PANEL - Abnormal; Notable for the following components:      Result Value   Chloride 96 (*)    Glucose, Bld 109 (*)    All other components within normal limits  CBC WITH DIFFERENTIAL/PLATELET - Abnormal; Notable for the following components:   RBC 5.19 (*)    Hemoglobin 16.8 (*)    HCT 51.5 (*)    All other components within normal limits  D-DIMER, QUANTITATIVE - Abnormal; Notable for the following components:   D-Dimer, Quant 0.94 (*)    All other components within normal limits  RESP PANEL BY RT-PCR (FLU A&B, COVID) ARPGX2  TROPONIN I (HIGH SENSITIVITY)  TROPONIN I (HIGH SENSITIVITY)    EKG EKG Interpretation  Date/Time:  Wednesday December 09 2021 18:53:07 EDT Ventricular Rate:  93 PR Interval:  138 QRS Duration: 96 QT Interval:  377 QTC Calculation: 469 R Axis:   110 Text Interpretation: Sinus rhythm Ventricular premature complex Probable right ventricular hypertrophy Confirmed by Davonna Belling [id number]) on 12/09/2021 11:00:29 PM  Radiology CT Angio Chest PE W and/or Wo Contrast  Result Date: 12/09/2021 CLINICAL DATA:  Pulmonary embolism suspected, positive D-dimer. Shortness of breath for 3 weeks. EXAM: CT ANGIOGRAPHY CHEST WITH CONTRAST TECHNIQUE: Multidetector CT imaging of the chest was performed using the standard protocol during bolus administration of intravenous contrast. Multiplanar CT image reconstructions and MIPs were obtained to evaluate the vascular anatomy. RADIATION DOSE REDUCTION: This exam was performed  according to the departmental dose-optimization program which includes automated exposure control, adjustment of the mA and/or kV according to patient size and/or use of iterative reconstruction technique. CONTRAST:  40m OMNIPAQUE IOHEXOL 350 MG/ML SOLN COMPARISON:  03/10/2020. FINDINGS: Cardiovascular: The heart is mildly enlarged and there is no pericardial effusion. Multi-vessel coronary artery calcifications are noted. There is atherosclerotic calcification of the aorta without evidence of aneurysm. The pulmonary trunk is normal in caliber. No pulmonary artery filling defect is identified. Mediastinum/Nodes: Enlarged lymph nodes are present in the mediastinum measuring up to 1.2 cm in the precarinal space. There are prominent hilar lymph nodes. No axillary lymphadenopathy. The thyroid gland, trachea, and esophagus are within normal limits. Lungs/Pleura: Emphysematous changes are noted  in the lungs bilaterally. There are small bilateral pleural effusions with atelectasis. Focal atelectasis is noted in the lingular segment of the left upper lobe. There is a nodule in the posterior segment of the right upper lobe measuring 5 mm, axial image 50. No pneumothorax. Upper Abdomen: No acute abnormality. Musculoskeletal: Surgical clips are noted in the right breast. Old healed rib fractures are noted on the right. No acute or suspicious osseous abnormality Review of the MIP images confirms the above findings. IMPRESSION: 1. No evidence of pulmonary embolism. 2. Small bilateral pleural effusions with atelectasis. 3. 5 mm right upper lobe pulmonary nodule, unchanged from 2021 and likely benign. 4. Emphysema. 5. Aortic atherosclerosis and coronary artery calcifications. Electronically Signed   By: Brett Fairy M.D.   On: 12/09/2021 21:57   DG Chest Portable 1 View  Result Date: 12/09/2021 CLINICAL DATA:  Shortness of breath. EXAM: PORTABLE CHEST 1 VIEW COMPARISON:  January 05, 2019 FINDINGS: Calcific atherosclerotic  disease of the aorta. Cardiomediastinal silhouette is normal. Mediastinal contours appear intact. Small left pleural effusion. Left lower lobe atelectasis versus airspace consolidation. Osseous structures are without acute abnormality. Soft tissues are grossly normal. IMPRESSION: Small left pleural effusion with left lower lobe atelectasis versus airspace consolidation. Electronically Signed   By: Fidela Salisbury M.D.   On: 12/09/2021 19:15    Procedures Procedures    Medications Ordered in ED Medications  albuterol (PROVENTIL) (2.5 MG/3ML) 0.083% nebulizer solution 5 mg (5 mg Nebulization Given 12/09/21 1947)  ipratropium (ATROVENT) nebulizer solution 0.5 mg (0.5 mg Nebulization Given 12/09/21 1948)  cefTRIAXone (ROCEPHIN) 2 g in sodium chloride 0.9 % 100 mL IVPB (0 g Intravenous Stopped 12/09/21 2251)  iohexol (OMNIPAQUE) 350 MG/ML injection 65 mL (65 mLs Intravenous Contrast Given 12/09/21 2144)    ED Course/ Medical Decision Making/ A&P                           Medical Decision Making Amount and/or Complexity of Data Reviewed Labs: ordered. Radiology: ordered.  Risk Prescription drug management.   Patient with shortness of breath.Has had for the last couple days.  Hypoxic even on her oxygen.  Chest x-ray showed potentially a pneumonia.  Not having fevers.  We will treat with antibiotics.  However with cancer history and only mild wheezes will also add a D-dimer, D-dimer is positive will get CTA.  Will likely require admission to the hospital.  Does have some continued hypoxia.  Feels somewhat better.  Still on nonrebreather but attempting to work her way down.  D-dimer was positive but CTA did not show pneumonia.  Does have some pleural effusions.  Antibiotics been given to her with x-ray that showed possible pneumonia.  Will require admission the hospital.  Will discuss with unassigned medicine.          Final Clinical Impression(s) / ED Diagnoses Final diagnoses:  COPD  exacerbation ([place])  Hypoxia    Rx / DC Orders ED Discharge Orders     None         Davonna Belling, MD 12/09/21 2300

## 2021-12-10 ENCOUNTER — Other Ambulatory Visit: Payer: Self-pay

## 2021-12-10 ENCOUNTER — Encounter (HOSPITAL_COMMUNITY): Payer: Self-pay | Admitting: Internal Medicine

## 2021-12-10 DIAGNOSIS — Z9981 Dependence on supplemental oxygen: Secondary | ICD-10-CM | POA: Diagnosis not present

## 2021-12-10 DIAGNOSIS — I10 Essential (primary) hypertension: Secondary | ICD-10-CM

## 2021-12-10 DIAGNOSIS — R0609 Other forms of dyspnea: Secondary | ICD-10-CM | POA: Diagnosis not present

## 2021-12-10 DIAGNOSIS — R0902 Hypoxemia: Secondary | ICD-10-CM | POA: Diagnosis present

## 2021-12-10 DIAGNOSIS — Z170 Estrogen receptor positive status [ER+]: Secondary | ICD-10-CM

## 2021-12-10 DIAGNOSIS — Z885 Allergy status to narcotic agent status: Secondary | ICD-10-CM | POA: Diagnosis not present

## 2021-12-10 DIAGNOSIS — J9621 Acute and chronic respiratory failure with hypoxia: Secondary | ICD-10-CM

## 2021-12-10 DIAGNOSIS — Z808 Family history of malignant neoplasm of other organs or systems: Secondary | ICD-10-CM | POA: Diagnosis not present

## 2021-12-10 DIAGNOSIS — J90 Pleural effusion, not elsewhere classified: Secondary | ICD-10-CM | POA: Diagnosis present

## 2021-12-10 DIAGNOSIS — Z91013 Allergy to seafood: Secondary | ICD-10-CM | POA: Diagnosis not present

## 2021-12-10 DIAGNOSIS — J9611 Chronic respiratory failure with hypoxia: Secondary | ICD-10-CM

## 2021-12-10 DIAGNOSIS — F419 Anxiety disorder, unspecified: Secondary | ICD-10-CM | POA: Diagnosis present

## 2021-12-10 DIAGNOSIS — Y92239 Unspecified place in hospital as the place of occurrence of the external cause: Secondary | ICD-10-CM | POA: Diagnosis not present

## 2021-12-10 DIAGNOSIS — T502X5A Adverse effect of carbonic-anhydrase inhibitors, benzothiadiazides and other diuretics, initial encounter: Secondary | ICD-10-CM | POA: Diagnosis not present

## 2021-12-10 DIAGNOSIS — C50411 Malignant neoplasm of upper-outer quadrant of right female breast: Secondary | ICD-10-CM

## 2021-12-10 DIAGNOSIS — Z681 Body mass index (BMI) 19 or less, adult: Secondary | ICD-10-CM | POA: Diagnosis not present

## 2021-12-10 DIAGNOSIS — Z87891 Personal history of nicotine dependence: Secondary | ICD-10-CM | POA: Diagnosis not present

## 2021-12-10 DIAGNOSIS — E876 Hypokalemia: Secondary | ICD-10-CM | POA: Diagnosis not present

## 2021-12-10 DIAGNOSIS — Z825 Family history of asthma and other chronic lower respiratory diseases: Secondary | ICD-10-CM | POA: Diagnosis not present

## 2021-12-10 DIAGNOSIS — Z888 Allergy status to other drugs, medicaments and biological substances status: Secondary | ICD-10-CM | POA: Diagnosis not present

## 2021-12-10 DIAGNOSIS — Z800 Family history of malignant neoplasm of digestive organs: Secondary | ICD-10-CM | POA: Diagnosis not present

## 2021-12-10 DIAGNOSIS — J441 Chronic obstructive pulmonary disease with (acute) exacerbation: Secondary | ICD-10-CM

## 2021-12-10 DIAGNOSIS — Z7982 Long term (current) use of aspirin: Secondary | ICD-10-CM | POA: Diagnosis not present

## 2021-12-10 DIAGNOSIS — F32A Depression, unspecified: Secondary | ICD-10-CM | POA: Diagnosis present

## 2021-12-10 DIAGNOSIS — E440 Moderate protein-calorie malnutrition: Secondary | ICD-10-CM | POA: Diagnosis present

## 2021-12-10 DIAGNOSIS — Z20822 Contact with and (suspected) exposure to covid-19: Secondary | ICD-10-CM | POA: Diagnosis present

## 2021-12-10 DIAGNOSIS — Z7951 Long term (current) use of inhaled steroids: Secondary | ICD-10-CM | POA: Diagnosis not present

## 2021-12-10 LAB — HIV ANTIBODY (ROUTINE TESTING W REFLEX): HIV Screen 4th Generation wRfx: NONREACTIVE

## 2021-12-10 LAB — CBC WITH DIFFERENTIAL/PLATELET
Abs Immature Granulocytes: 0.03 10*3/uL (ref 0.00–0.07)
Basophils Absolute: 0 10*3/uL (ref 0.0–0.1)
Basophils Relative: 0 %
Eosinophils Absolute: 0 10*3/uL (ref 0.0–0.5)
Eosinophils Relative: 0 %
HCT: 46.7 % — ABNORMAL HIGH (ref 36.0–46.0)
Hemoglobin: 15 g/dL (ref 12.0–15.0)
Immature Granulocytes: 0 %
Lymphocytes Relative: 2 %
Lymphs Abs: 0.2 10*3/uL — ABNORMAL LOW (ref 0.7–4.0)
MCH: 32.1 pg (ref 26.0–34.0)
MCHC: 32.1 g/dL (ref 30.0–36.0)
MCV: 100 fL (ref 80.0–100.0)
Monocytes Absolute: 0 10*3/uL — ABNORMAL LOW (ref 0.1–1.0)
Monocytes Relative: 0 %
Neutro Abs: 9.9 10*3/uL — ABNORMAL HIGH (ref 1.7–7.7)
Neutrophils Relative %: 98 %
Platelets: 239 10*3/uL (ref 150–400)
RBC: 4.67 MIL/uL (ref 3.87–5.11)
RDW: 13.7 % (ref 11.5–15.5)
WBC: 10.2 10*3/uL (ref 4.0–10.5)
nRBC: 0 % (ref 0.0–0.2)

## 2021-12-10 LAB — BASIC METABOLIC PANEL
Anion gap: 11 (ref 5–15)
BUN: 14 mg/dL (ref 8–23)
CO2: 30 mmol/L (ref 22–32)
Calcium: 9.1 mg/dL (ref 8.9–10.3)
Chloride: 97 mmol/L — ABNORMAL LOW (ref 98–111)
Creatinine, Ser: 0.64 mg/dL (ref 0.44–1.00)
GFR, Estimated: >60 mL/min (ref >60)
Glucose, Bld: 203 mg/dL — ABNORMAL HIGH (ref 70–99)
Potassium: 4 mmol/L (ref 3.5–5.1)
Sodium: 138 mmol/L (ref 135–145)

## 2021-12-10 LAB — MAGNESIUM: Magnesium: 2.1 mg/dL (ref 1.7–2.4)

## 2021-12-10 MED ORDER — STERILE WATER FOR INJECTION IJ SOLN
INTRAMUSCULAR | Status: AC
  Administered 2021-12-10: 2 mL
  Filled 2021-12-10: qty 10

## 2021-12-10 MED ORDER — METHYLPREDNISOLONE SODIUM SUCC 125 MG IJ SOLR
125.0000 mg | Freq: Once | INTRAMUSCULAR | Status: AC
Start: 2021-12-10 — End: 2021-12-10
  Administered 2021-12-10: 125 mg via INTRAVENOUS
  Filled 2021-12-10: qty 2

## 2021-12-10 MED ORDER — POLYETHYLENE GLYCOL 3350 17 G PO PACK
17.0000 g | PACK | Freq: Every day | ORAL | Status: DC | PRN
  Administered 2021-12-11: 17 g via ORAL
  Filled 2021-12-10: qty 1

## 2021-12-10 MED ORDER — FLUTICASONE PROPIONATE 50 MCG/ACT NA SUSP
2.0000 | Freq: Every day | NASAL | Status: DC
  Administered 2021-12-11 – 2021-12-15 (×5): 2 via NASAL
  Filled 2021-12-10: qty 16

## 2021-12-10 MED ORDER — ENOXAPARIN SODIUM 40 MG/0.4ML IJ SOSY
40.0000 mg | PREFILLED_SYRINGE | INTRAMUSCULAR | Status: DC
  Administered 2021-12-12: 40 mg via SUBCUTANEOUS
  Filled 2021-12-10 (×3): qty 0.4

## 2021-12-10 MED ORDER — ARFORMOTEROL TARTRATE 15 MCG/2ML IN NEBU
15.0000 ug | INHALATION_SOLUTION | Freq: Two times a day (BID) | RESPIRATORY_TRACT | Status: DC
  Administered 2021-12-10 – 2021-12-15 (×11): 15 ug via RESPIRATORY_TRACT
  Filled 2021-12-10 (×12): qty 2

## 2021-12-10 MED ORDER — ENSURE ENLIVE PO LIQD: 237.0000 mL | Freq: Two times a day (BID) | ORAL | Status: DC

## 2021-12-10 MED ORDER — MAGNESIUM SULFATE 4 GM/100ML IV SOLN: 4.0000 g | Freq: Once | INTRAVENOUS | Status: DC

## 2021-12-10 MED ORDER — HEPARIN SODIUM (PORCINE) 5000 UNIT/ML IJ SOLN
5000.0000 [IU] | Freq: Three times a day (TID) | INTRAMUSCULAR | Status: DC
  Filled 2021-12-10: qty 1

## 2021-12-10 MED ORDER — IPRATROPIUM-ALBUTEROL 0.5-2.5 (3) MG/3ML IN SOLN
3.0000 mL | Freq: Four times a day (QID) | RESPIRATORY_TRACT | Status: DC
  Administered 2021-12-10 (×2): 3 mL via RESPIRATORY_TRACT
  Filled 2021-12-10 (×2): qty 3

## 2021-12-10 MED ORDER — LORATADINE 10 MG PO TABS
10.0000 mg | ORAL_TABLET | Freq: Every day | ORAL | Status: DC
  Administered 2021-12-10 – 2021-12-15 (×6): 10 mg via ORAL
  Filled 2021-12-10 (×6): qty 1

## 2021-12-10 MED ORDER — ASPIRIN 81 MG PO TBEC
81.0000 mg | DELAYED_RELEASE_TABLET | Freq: Every day | ORAL | Status: DC
  Administered 2021-12-10 – 2021-12-15 (×6): 81 mg via ORAL
  Filled 2021-12-10 (×6): qty 1

## 2021-12-10 MED ORDER — IPRATROPIUM-ALBUTEROL 0.5-2.5 (3) MG/3ML IN SOLN: 3.0000 mL | Freq: Four times a day (QID) | RESPIRATORY_TRACT | Status: DC

## 2021-12-10 MED ORDER — SODIUM CHLORIDE 0.9 % IV SOLN
2.0000 g | INTRAVENOUS | Status: DC
  Administered 2021-12-10 – 2021-12-13 (×4): 2 g via INTRAVENOUS
  Filled 2021-12-10 (×4): qty 20

## 2021-12-10 MED ORDER — ONDANSETRON HCL 4 MG/2ML IJ SOLN: 4.0000 mg | Freq: Four times a day (QID) | INTRAMUSCULAR | Status: DC | PRN

## 2021-12-10 MED ORDER — ONDANSETRON HCL 4 MG PO TABS: 4.0000 mg | ORAL_TABLET | Freq: Four times a day (QID) | ORAL | Status: DC | PRN

## 2021-12-10 MED ORDER — PANTOPRAZOLE SODIUM 40 MG PO TBEC
40.0000 mg | DELAYED_RELEASE_TABLET | Freq: Every day | ORAL | Status: DC
  Administered 2021-12-10 – 2021-12-15 (×6): 40 mg via ORAL
  Filled 2021-12-10 (×6): qty 1

## 2021-12-10 MED ORDER — BUDESONIDE 0.5 MG/2ML IN SUSP
0.5000 mg | Freq: Two times a day (BID) | RESPIRATORY_TRACT | Status: DC
  Administered 2021-12-10 – 2021-12-15 (×11): 0.5 mg via RESPIRATORY_TRACT
  Filled 2021-12-10 (×13): qty 2

## 2021-12-10 MED ORDER — ALBUTEROL SULFATE (2.5 MG/3ML) 0.083% IN NEBU
10.0000 mg/h | INHALATION_SOLUTION | RESPIRATORY_TRACT | Status: DC
  Administered 2021-12-10: 10 mg/h via RESPIRATORY_TRACT
  Filled 2021-12-10: qty 12

## 2021-12-10 MED ORDER — GUAIFENESIN ER 600 MG PO TB12
1200.0000 mg | ORAL_TABLET | Freq: Two times a day (BID) | ORAL | Status: DC
  Administered 2021-12-10 – 2021-12-15 (×11): 1200 mg via ORAL
  Filled 2021-12-10 (×11): qty 2

## 2021-12-10 MED ORDER — OMEGA-3-ACID ETHYL ESTERS 1 G PO CAPS
1.0000 g | ORAL_CAPSULE | Freq: Every day | ORAL | Status: DC
  Administered 2021-12-11 – 2021-12-15 (×5): 1 g via ORAL
  Filled 2021-12-10 (×6): qty 1

## 2021-12-10 MED ORDER — METOPROLOL TARTRATE 25 MG PO TABS
25.0000 mg | ORAL_TABLET | Freq: Two times a day (BID) | ORAL | Status: DC
  Administered 2021-12-10 – 2021-12-15 (×11): 25 mg via ORAL
  Filled 2021-12-10 (×11): qty 1

## 2021-12-10 MED ORDER — METHYLPREDNISOLONE SODIUM SUCC 125 MG IJ SOLR
120.0000 mg | Freq: Two times a day (BID) | INTRAMUSCULAR | Status: DC
  Administered 2021-12-10 – 2021-12-12 (×5): 120 mg via INTRAVENOUS
  Filled 2021-12-10 (×5): qty 2

## 2021-12-10 MED ORDER — ALPRAZOLAM 0.5 MG PO TABS
0.5000 mg | ORAL_TABLET | Freq: Every evening | ORAL | Status: DC | PRN
Start: 2021-12-10 — End: 2021-12-15
  Administered 2021-12-11 – 2021-12-14 (×3): 0.5 mg via ORAL
  Filled 2021-12-10 (×3): qty 1

## 2021-12-10 MED ORDER — ACETAMINOPHEN 325 MG PO TABS: 650.0000 mg | ORAL_TABLET | Freq: Four times a day (QID) | ORAL | Status: DC | PRN

## 2021-12-10 MED ORDER — ACETAMINOPHEN 650 MG RE SUPP: 650.0000 mg | Freq: Four times a day (QID) | RECTAL | Status: DC | PRN

## 2021-12-10 MED ORDER — AMLODIPINE BESYLATE 10 MG PO TABS
10.0000 mg | ORAL_TABLET | Freq: Every day | ORAL | Status: DC
  Administered 2021-12-10 – 2021-12-15 (×6): 10 mg via ORAL
  Filled 2021-12-10: qty 2
  Filled 2021-12-10 (×5): qty 1

## 2021-12-10 MED ORDER — PREDNISONE 20 MG PO TABS: 40.0000 mg | ORAL_TABLET | Freq: Every day | ORAL | Status: DC

## 2021-12-10 MED ORDER — ANASTROZOLE 1 MG PO TABS: 1.0000 mg | ORAL_TABLET | Freq: Every day | ORAL | Status: DC

## 2021-12-10 MED ORDER — IPRATROPIUM-ALBUTEROL 0.5-2.5 (3) MG/3ML IN SOLN
3.0000 mL | Freq: Four times a day (QID) | RESPIRATORY_TRACT | Status: DC
  Administered 2021-12-11 (×2): 3 mL via RESPIRATORY_TRACT
  Filled 2021-12-10 (×2): qty 3

## 2021-12-10 MED ORDER — ATORVASTATIN CALCIUM 10 MG PO TABS
10.0000 mg | ORAL_TABLET | Freq: Every day | ORAL | Status: DC
  Administered 2021-12-10 – 2021-12-15 (×6): 10 mg via ORAL
  Filled 2021-12-10 (×6): qty 1

## 2021-12-10 MED ORDER — VITAMIN B-12 1000 MCG PO TABS
500.0000 ug | ORAL_TABLET | Freq: Every day | ORAL | Status: DC
Start: 2021-12-10 — End: 2021-12-15
  Administered 2021-12-10 – 2021-12-15 (×6): 500 ug via ORAL
  Filled 2021-12-10 (×6): qty 1

## 2021-12-10 NOTE — Assessment & Plan Note (Addendum)
-  Remained stable on home regimen of Norvasc, Lopressor.

## 2021-12-10 NOTE — Assessment & Plan Note (Addendum)
Prior to admission noted to be receiving external beam radiation. -Oncology informed via epic of patient's admission.  -Outpatient follow-up with primary oncologist

## 2021-12-10 NOTE — Assessment & Plan Note (Addendum)
Acutely exacerbated.  Baseline home O2 is 3 L a minute.  -O2 requirements improving currently on 5 L/min, with clinical improvement. -Decrease IV Solu-Medrol to 60 mg every 12 hours -Continue Pulmicort, Brovana, Flonase, Claritin, PPI, IV Rocephin, scheduled DuoNebs. -See above COPD exacerbation.

## 2021-12-10 NOTE — Subjective & Objective (Signed)
CC: SOB, cough HPI: 66yo AAF with hx of breast cancer undergoing XRT, COPD, chronic respiratory failure on home O2 at 3 l/min presents to ER with worsening SOB for 3 days. pt states she went to see her radiologist about 2 weeks ago. she states she was "exposed to the smoke from [place]" while she was sitting outside waiting for her car at the [place] station. pt states she just completed a round of XRT for her breast cancer.  over last several days, she has had to increase her home O2 to 4L/min. no fevers. no chills. no productive cough.  on arrival to ER, pt hypoxic to 81% on 4 L/min. pt quickly escalated to 10 L/min and then to 100% NRB. CTA negative for PE and pneumonia.  ER unable to wean pt back down to home O2 of 3 L/min.  TRH consulted for admission.

## 2021-12-10 NOTE — Hospital Course (Signed)
Patient is a pleasant 66 year old African-American female, history of breast cancer undergoing XRT, chronic respiratory failure on 3 L nasal cannula home O2 secondary to COPD presented to the ED with worsening shortness of breath x3 days.  Stated went to see PCP 2 weeks ago and was exposed to smoke from [place] while she was sitting outside waiting for her car at the [place] station.  Patient stated was given some doxycycline however after completion of doxycycline symptoms worsened.  Patient stated completed a round of XRT for breast cancer.  Patient presented to the ED noted to be hypoxic with sats of 81% on 4 L nasal cannula requiring 10 L/min and subsequently 100% nonrebreather.  CT angiogram chest done negative for PE and pneumonia.  Patient admitted for acute on chronic respiratory failure secondary to acute COPD exacerbation.

## 2021-12-10 NOTE — Assessment & Plan Note (Addendum)
Acutely worsened however improving.   -Chronically on 3-4 L a minute at home.

## 2021-12-10 NOTE — Assessment & Plan Note (Addendum)
-  Patient presented in acute COPD exacerbation.   -Patient needed 100% nonrebreather, requiring 10 L/min nasal cannula.  -Patient was requiring 10 L/min nasal cannula with improvement with O2 requirements currently down to 5 L/min. -Patient initially received IV Solu-Medrol and subsequently placed on oral prednisone 40 mg daily.  -Due to increasing O2 requirements and some use of accessory muscles of respiration was placed back on IV Solu-Medrol, Pulmicort, Brovana, Flonase, Claritin, PPI, IV Rocephin. -Decrease IV Solu-Medrol to 60 mg every 12 hours.   -Continue supportive care.  COPD exacerbation ([place]) is a Acute on Chronic illness/condition that poses a threat to life or bodily function.
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
## 2021-12-10 NOTE — H&P (Addendum)
History and Physical    Justyn Boyson NFA:[id number] DOB: 1955-10-30 DOA: 12/09/2021  DOS: the patient was seen and examined on 12/09/2021  PCP: [name], MD   Patient coming from: Home  I have personally briefly reviewed patient's old medical records in [place]  CC: SOB, cough HPI: 66yo AAF with hx of breast cancer undergoing XRT, COPD, chronic respiratory failure on home O2 at 3 l/min presents to ER with worsening SOB for 3 days. pt states she went to see her radiologist about 2 weeks ago. she states she was "exposed to the smoke from [place]" while she was sitting outside waiting for her car at the [place] station. pt states she just completed a round of XRT for her breast cancer.  over last several days, she has had to increase her home O2 to 4L/min. no fevers. no chills. no productive cough.  on arrival to ER, pt hypoxic to 81% on 4 L/min. pt quickly escalated to 10 L/min and then to 100% NRB. CTA negative for PE and pneumonia.  ER unable to wean pt back down to home O2 of 3 L/min.  TRH consulted for admission.   ED Course: CTA negative for pneumonia and PE  Review of Systems:  Review of Systems  Constitutional: Negative.  Negative for chills and fever.  HENT: Negative.    Eyes: Negative.   Respiratory:  Positive for cough, shortness of breath and wheezing.   Cardiovascular: Negative.   Gastrointestinal: Negative.   Genitourinary: Negative.   Musculoskeletal: Negative.   Skin: Negative.   Neurological: Negative.   Endo/Heme/Allergies: Negative.   Psychiatric/Behavioral: Negative.    All other systems reviewed and are negative.   Past Medical History:  Diagnosis Date   Acute respiratory failure with hypoxia ([place]) 09/30/2018   Breast cancer ([place])    Constipation    COPD exacerbation ([place]) 01/05/2019   Depression with anxiety 09/30/2018   Dyspnea    Essential hypertension 09/30/2018   History of radiation therapy    Right breast- 09/01/21-10/16/21- Dr.  Gery Pray   Hypertension    Tobacco dependence due to cigarettes 09/30/2018    Past Surgical History:  Procedure Laterality Date   BREAST LUMPECTOMY WITH RADIOACTIVE SEED AND SENTINEL LYMPH NODE BIOPSY Right 07/23/2021   Procedure: RIGHT BREAST LUMPECTOMY WITH RADIOACTIVE SEED AND SENTINEL LYMPH NODE BIOPSY;  Surgeon: Jovita Kussmaul, MD;  Location: [facility];  Service: General;  Laterality: Right;   HAND SURGERY Right 1997   Hand Fracture (right thumb & hand)   LAPAROTOMY  1974   RIGHT/LEFT HEART CATH AND CORONARY ANGIOGRAPHY N/A 10/02/2019   Procedure: RIGHT/LEFT HEART CATH AND CORONARY ANGIOGRAPHY;  Surgeon: Sherren Mocha, MD;  Location: [facility];  Service: Cardiovascular;  Laterality: N/A;     reports that she has quit smoking. Her smoking use included cigarettes. She has a 34.40 pack-year smoking history. She has never used smokeless tobacco. She reports current alcohol use. She reports that she does not currently use drugs after having used the following drugs: Marijuana.  Allergies  Allergen Reactions   Crab [Shellfish Allergy] Anaphylaxis and Swelling    Dungeness crab   Erythromycin Other (See Comments)    convulsion    Family History  Problem Relation Age of Onset   COPD Mother    Throat cancer Maternal Grandmother    Prostate cancer Other    Prostate cancer Other    Prostate cancer Other    Esophageal cancer Maternal Aunt  Prior to Admission medications   Medication Sig Start Date End Date Taking? Authorizing Provider  albuterol (VENTOLIN HFA) 108 (90 Base) MCG/ACT inhaler Inhale 2 puffs into the lungs every 6 (six) hours as needed for wheezing or shortness of breath. 11/30/21   Icard, Octavio Graves, DO  ALPRAZolam Duanne Moron) 0.5 MG tablet Take 0.5 mg by mouth at bedtime as needed for sleep. 10/31/18   [provider]  amLODipine (NORVASC) 5 MG tablet Take 1 tablet (5 mg total) by mouth daily. Patient taking differently: Take 10 mg by mouth daily. 06/12/21    Loel Dubonnet, NP  anastrozole (ARIMIDEX) 1 MG tablet Take 1 tablet (1 mg total) by mouth daily. 12/03/21   Nicholas Lose, MD  aspirin EC 81 MG tablet Take 81 mg by mouth daily.    [provider]  atorvastatin (LIPITOR) 10 MG tablet Take 1 tablet (10 mg total) by mouth daily. 06/26/21   Sueanne Margarita, MD  Budeson-Glycopyrrol-Formoterol (BREZTRI AEROSPHERE) 160-9-4.8 MCG/ACT AERO Inhale 2 puffs into the lungs in the morning and at bedtime. 03/05/20   Icard, Octavio Graves, DO  fluticasone (FLONASE) 50 MCG/ACT nasal spray Place 2 sprays into both nostrils daily. If needed will use additional 2 spray at bedtime    [provider]  ipratropium-albuterol (DUONEB) 0.5-2.5 (3) MG/3ML SOLN Take 3 mLs by nebulization 4 (four) times daily as needed (shortness of breath). 07/04/19   [provider]  metoprolol tartrate (LOPRESSOR) 25 MG tablet Take 1 tablet (25 mg total) by mouth 2 (two) times daily. 06/12/21   Loel Dubonnet, NP  Omega-3 Fatty Acids (FISH OIL) 1000 MG CAPS Take 1,000 mg by mouth daily.    [provider]  OXYGEN Inhale 3 L into the lungs continuous.    [provider]  polyethylene glycol (MIRALAX / GLYCOLAX) 17 g packet Take 17 g by mouth daily. Patient taking differently: Take 17 g by mouth daily as needed for mild constipation or moderate constipation. 01/07/19   Arrien, Jimmy Picket, MD  PROAIR HFA 108 [phone] Base) MCG/ACT inhaler Inhale 2 puffs into the lungs every 6 (six) hours as needed for wheezing or shortness of breath. 90 day supply 10/09/19   Icard, Leory Plowman L, DO  vitamin B-12 (CYANOCOBALAMIN) 500 MCG tablet Take 500 mcg by mouth daily.    [provider]    Physical Exam: Vitals:   12/09/21 2230 12/10/21 0030 12/10/21 0100 12/10/21 0230  BP: 123/62 115/70  137/83  Pulse: 85 96  98  Resp: (!) 22 (!) 23  (!) 21  Temp:      TempSrc:      SpO2: 96% 91% 90% 98%    Physical Exam Vitals and nursing note reviewed.   Constitutional:      Appearance: Normal appearance.     Comments: Chronically ill appearing  HENT:     Head: Normocephalic and atraumatic.     Nose: Nose normal. No rhinorrhea.  Eyes:     General: No scleral icterus. Cardiovascular:     Rate and Rhythm: Normal rate and regular rhythm.     Pulses: Normal pulses.  Pulmonary:     Breath sounds: Decreased air movement present. Examination of the right-upper field reveals wheezing. Examination of the left-upper field reveals wheezing. Decreased breath sounds and wheezing present.  Abdominal:     General: Abdomen is flat. Bowel sounds are normal. There is no distension.     Palpations: Abdomen is soft. There is no mass.  Musculoskeletal:  Right lower leg: No edema.     Left lower leg: No edema.  Skin:    General: Skin is warm and dry.     Capillary Refill: Capillary refill takes less than 2 seconds.  Neurological:     General: No focal deficit present.     Mental Status: She is alert and oriented to person, place, and time.      Labs on Admission: I have personally reviewed following labs and imaging studies  CBC: Recent Labs  Lab 12/09/21 1900  WBC 8.7  NEUTROABS 6.8  HGB 16.8*  HCT 51.5*  MCV 99.2  PLT 476   Basic Metabolic Panel: Recent Labs  Lab 12/09/21 1900  NA 138  K 3.9  CL 96*  CO2 32  GLUCOSE 109*  BUN 17  CREATININE 0.69  CALCIUM 9.1   GFR: Estimated Creatinine Clearance: 63.1 mL/min (by C-G formula based on SCr of 0.69 mg/dL). Liver Function Tests: Recent Labs  Lab 12/09/21 1900  AST 16  ALT 15  ALKPHOS 58  BILITOT 0.3  PROT 7.5  ALBUMIN 3.7   No results for input(s): "LIPASE", "AMYLASE" in the last 168 hours. No results for input(s): "AMMONIA" in the last 168 hours. Coagulation Profile: No results for input(s): "INR", "PROTIME" in the last 168 hours. Cardiac Enzymes: Recent Labs  Lab 12/09/21 1900 12/09/21 2210  TROPONINIHS 5 6   BNP (last 3 results) No results for input(s):  "PROBNP" in the last 8760 hours. HbA1C: No results for input(s): "HGBA1C" in the last 72 hours. CBG: No results for input(s): "GLUCAP" in the last 168 hours. Lipid Profile: No results for input(s): "CHOL", "HDL", "LDLCALC", "TRIG", "CHOLHDL", "LDLDIRECT" in the last 72 hours. Thyroid Function Tests: No results for input(s): "TSH", "T4TOTAL", "FREET4", "T3FREE", "THYROIDAB" in the last 72 hours. Anemia Panel: No results for input(s): "VITAMINB12", "FOLATE", "FERRITIN", "TIBC", "IRON", "RETICCTPCT" in the last 72 hours. Urine analysis:    Component Value Date/Time   COLORURINE YELLOW 12/24/2007 1833   APPEARANCEUR CLEAR 12/24/2007 1833   LABSPEC 1.008 12/24/2007 1833   PHURINE 6.0 12/24/2007 1833   GLUCOSEU NEGATIVE 12/24/2007 1833   HGBUR NEGATIVE 12/24/2007 1833   BILIRUBINUR NEGATIVE 12/24/2007 1833   KETONESUR NEGATIVE 12/24/2007 1833   PROTEINUR NEGATIVE 12/24/2007 1833   UROBILINOGEN 0.2 12/24/2007 1833   NITRITE NEGATIVE 12/24/2007 1833   LEUKOCYTESUR MODERATE (A) 12/24/2007 1833    Radiological Exams on Admission: I have personally reviewed images CT Angio Chest PE W and/or Wo Contrast  Result Date: 12/09/2021 CLINICAL DATA:  Pulmonary embolism suspected, positive D-dimer. Shortness of breath for 3 weeks. EXAM: CT ANGIOGRAPHY CHEST WITH CONTRAST TECHNIQUE: Multidetector CT imaging of the chest was performed using the standard protocol during bolus administration of intravenous contrast. Multiplanar CT image reconstructions and MIPs were obtained to evaluate the vascular anatomy. RADIATION DOSE REDUCTION: This exam was performed according to the departmental dose-optimization program which includes automated exposure control, adjustment of the mA and/or kV according to patient size and/or use of iterative reconstruction technique. CONTRAST:  83m OMNIPAQUE IOHEXOL 350 MG/ML SOLN COMPARISON:  03/10/2020. FINDINGS: Cardiovascular: The heart is mildly enlarged and there is no  pericardial effusion. Multi-vessel coronary artery calcifications are noted. There is atherosclerotic calcification of the aorta without evidence of aneurysm. The pulmonary trunk is normal in caliber. No pulmonary artery filling defect is identified. Mediastinum/Nodes: Enlarged lymph nodes are present in the mediastinum measuring up to 1.2 cm in the precarinal space. There are prominent hilar lymph nodes. No axillary  lymphadenopathy. The thyroid gland, trachea, and esophagus are within normal limits. Lungs/Pleura: Emphysematous changes are noted in the lungs bilaterally. There are small bilateral pleural effusions with atelectasis. Focal atelectasis is noted in the lingular segment of the left upper lobe. There is a nodule in the posterior segment of the right upper lobe measuring 5 mm, axial image 50. No pneumothorax. Upper Abdomen: No acute abnormality. Musculoskeletal: Surgical clips are noted in the right breast. Old healed rib fractures are noted on the right. No acute or suspicious osseous abnormality Review of the MIP images confirms the above findings. IMPRESSION: 1. No evidence of pulmonary embolism. 2. Small bilateral pleural effusions with atelectasis. 3. 5 mm right upper lobe pulmonary nodule, unchanged from 2021 and likely benign. 4. Emphysema. 5. Aortic atherosclerosis and coronary artery calcifications. Electronically Signed   By: Brett Fairy M.D.   On: 12/09/2021 21:57   DG Chest Portable 1 View  Result Date: 12/09/2021 CLINICAL DATA:  Shortness of breath. EXAM: PORTABLE CHEST 1 VIEW COMPARISON:  January 05, 2019 FINDINGS: Calcific atherosclerotic disease of the aorta. Cardiomediastinal silhouette is normal. Mediastinal contours appear intact. Small left pleural effusion. Left lower lobe atelectasis versus airspace consolidation. Osseous structures are without acute abnormality. Soft tissues are grossly normal. IMPRESSION: Small left pleural effusion with left lower lobe atelectasis versus  airspace consolidation. Electronically Signed   By: Fidela Salisbury M.D.   On: 12/09/2021 19:15    EKG: My personal interpretation of EKG shows: NSR    Assessment/Plan Principal Problem:   COPD exacerbation (HCC) Active Problems:   Acute on chronic respiratory failure with hypoxia (HCC)   Chronic respiratory failure with hypoxia, on home O2 therapy ([facility]CC) - 3 L/min   Essential hypertension   Malignant neoplasm of upper-outer quadrant of right breast in female, estrogen receptor positive ([place])    Assessment and Plan: * COPD exacerbation ([place]) Observation telemetry bed.  Give 125 mg IV Solu-Medrol in ER.  Start p.o. prednisone 40 mg daily.  Continue with DuoNebs every 6 hours.  No pneumonia on CTA.  Patient has nonproductive cough.  Hold antibiotic therapy for now.  COPD exacerbation ([place]) is a Acute on Chronic illness/condition that poses a threat to life or bodily function.   Acute on chronic respiratory failure with hypoxia (HCC) Acutely exacerbated.  Baseline home O2 is 3 L a minute.  Still currently on 10 L a minute.  We will need to wean down to 3 L a minute by discharge.  Malignant neoplasm of upper-outer quadrant of right breast in female, estrogen receptor positive ([place]) Currently receiving external beam radiation.  Essential hypertension Stable.  Continue 10 mg Norvasc daily.  Lopressor 25 mg twice daily.  Chronic respiratory failure with hypoxia, on home O2 therapy (HCC) - 3 L/min Acutely worsened.  Chronically on 3 L a minute at home.   DVT prophylaxis: SQ Heparin Code Status: Full Code Family Communication: no family at bedside  Disposition Plan: return home  Consults called: none  Admission status: Observation, Telemetry bed   Kristopher Oppenheim, DO [facility] 12/10/2021, 2:41 AM

## 2021-12-11 DIAGNOSIS — J441 Chronic obstructive pulmonary disease with (acute) exacerbation: Secondary | ICD-10-CM | POA: Diagnosis not present

## 2021-12-11 DIAGNOSIS — E440 Moderate protein-calorie malnutrition: Secondary | ICD-10-CM | POA: Insufficient documentation

## 2021-12-11 LAB — RESPIRATORY PANEL BY PCR

## 2021-12-11 LAB — CBC WITH DIFFERENTIAL/PLATELET
Abs Immature Granulocytes: 0.03 10*3/uL (ref 0.00–0.07)
Basophils Absolute: 0 10*3/uL (ref 0.0–0.1)
Basophils Relative: 0 %
Eosinophils Absolute: 0 10*3/uL (ref 0.0–0.5)
Eosinophils Relative: 0 %
HCT: 44.8 % (ref 36.0–46.0)
Hemoglobin: 14.6 g/dL (ref 12.0–15.0)
Immature Granulocytes: 0 %
Lymphocytes Relative: 5 %
Lymphs Abs: 0.4 10*3/uL — ABNORMAL LOW (ref 0.7–4.0)
MCH: 31.9 pg (ref 26.0–34.0)
MCHC: 32.6 g/dL (ref 30.0–36.0)
MCV: 97.8 fL (ref 80.0–100.0)
Monocytes Absolute: 0.1 10*3/uL (ref 0.1–1.0)
Monocytes Relative: 1 %
Neutro Abs: 8.4 10*3/uL — ABNORMAL HIGH (ref 1.7–7.7)
Neutrophils Relative %: 94 %
Platelets: 222 10*3/uL (ref 150–400)
RBC: 4.58 MIL/uL (ref 3.87–5.11)
RDW: 13.4 % (ref 11.5–15.5)
WBC: 8.9 10*3/uL (ref 4.0–10.5)
nRBC: 0 % (ref 0.0–0.2)

## 2021-12-11 LAB — BASIC METABOLIC PANEL
Anion gap: 7 (ref 5–15)
BUN: 12 mg/dL (ref 8–23)
CO2: 32 mmol/L (ref 22–32)
Calcium: 9.2 mg/dL (ref 8.9–10.3)
Chloride: 101 mmol/L (ref 98–111)
Creatinine, Ser: 0.49 mg/dL (ref 0.44–1.00)
GFR, Estimated: >60 mL/min (ref >60)
Glucose, Bld: 135 mg/dL — ABNORMAL HIGH (ref 70–99)
Potassium: 4.1 mmol/L (ref 3.5–5.1)
Sodium: 140 mmol/L (ref 135–145)

## 2021-12-11 LAB — MAGNESIUM: Magnesium: 2 mg/dL (ref 1.7–2.4)

## 2021-12-11 MED ORDER — BOOST / RESOURCE BREEZE PO LIQD CUSTOM
1.0000 | Freq: Three times a day (TID) | ORAL | Status: DC
  Administered 2021-12-12 – 2021-12-13 (×2): 1 via ORAL

## 2021-12-11 MED ORDER — MAGNESIUM HYDROXIDE 400 MG/5ML PO SUSP
15.0000 mL | Freq: Every day | ORAL | Status: DC | PRN
  Administered 2021-12-11: 15 mL via ORAL
  Filled 2021-12-11 (×2): qty 30

## 2021-12-11 MED ORDER — ALBUTEROL SULFATE (2.5 MG/3ML) 0.083% IN NEBU: 2.5000 mg | INHALATION_SOLUTION | RESPIRATORY_TRACT | Status: DC | PRN

## 2021-12-11 MED ORDER — IPRATROPIUM-ALBUTEROL 0.5-2.5 (3) MG/3ML IN SOLN
3.0000 mL | Freq: Three times a day (TID) | RESPIRATORY_TRACT | Status: DC
  Administered 2021-12-11 – 2021-12-12 (×3): 3 mL via RESPIRATORY_TRACT
  Filled 2021-12-11 (×3): qty 3

## 2021-12-11 NOTE — Progress Notes (Signed)
PROGRESS NOTE    Caroline Greer  HUD:[id number] DOB: 1955-05-27 DOA: 12/09/2021 PCP: [name], MD    Chief Complaint  Patient presents with   Shortness of Breath    Brief Narrative:  Patient is a pleasant 66 year old African-American female, history of breast cancer undergoing XRT, chronic respiratory failure on 3 L nasal cannula home O2 secondary to COPD presented to the ED with worsening shortness of breath x3 days.  Stated went to see PCP 2 weeks ago and was exposed to smoke from [place] while she was sitting outside waiting for her car at the [place] station.  Patient stated was given some doxycycline however after completion of doxycycline symptoms worsened.  Patient stated completed a round of XRT for breast cancer.  Patient presented to the ED noted to be hypoxic with sats of 81% on 4 L nasal cannula requiring 10 L/min and subsequently 100% nonrebreather.  CT angiogram chest done negative for PE and pneumonia.  Patient admitted for acute on chronic respiratory failure secondary to acute COPD exacerbation.    Assessment & Plan:  Principal Problem:   COPD exacerbation ([place]) Active Problems:   Acute on chronic respiratory failure with hypoxia (HCC)   Chronic respiratory failure with hypoxia, on home O2 therapy (HCC) - 3 L/min   Essential hypertension   Malignant neoplasm of upper-outer quadrant of right breast in female, estrogen receptor positive ([place])    Assessment and Plan: * COPD exacerbation ([place]) -Patient presented in acute COPD exacerbation.   -Patient needed 100% nonrebreather, requiring 10 L/min nasal cannula.  -Patient currently on 6L (wears 3L at home) -Patient received IV Solu-Medrol and subsequently placed on oral prednisone 40 mg daily.  -Solu-Medrol 80 mg IV every 8 hours, placed on Pulmicort, Brovana, Flonase, Claritin, PPI, IV Rocephin.  -Place in progressive care unit for closer monitoring.  -seems to be slowly improving -pulmonary toiletry  Acute on  chronic respiratory failure with hypoxia ([place]) Acutely exacerbated.  Baseline home O2 is 3 L a minute- wean back to that as tolerated.   -Placed back on IV Solu-Medrol, Pulmicort, Brovana, Flonase, Claritin, PPI, IV Rocephin, continue scheduled DuoNebs.    Malignant neoplasm of upper-outer quadrant of right breast in female, estrogen receptor positive ([place]) Currently receiving external beam radiation.  Essential hypertension Stable.   Continue home regimen Norvasc, Lopressor.     DVT prophylaxis: Lovenox Code Status: Full Family Communication: Updated patient.  No family at bedside. Disposition: Likely home when clinically improved and back to baseline chronic home O2 requirements.  Status is: Inpatient The patient will require care spanning > 2 midnights and should be moved to inpatient because: Severity of illness   Consultants:  None  Procedures:  CT angiogram chest 12/09/2021 Chest x-ray 12/09/2021   Antimicrobials:  IV Rocephin 12/10/2021>>>>   Subjective: Breathing better than yesterday Saturations still de-sat to 78 with movement per nursing  Objective: Vitals:   12/11/21 0031 12/11/21 0520 12/11/21 0734 12/11/21 0735  BP: 123/76 116/74    Pulse: 83 82    Resp: (!) 21 (!) 24    Temp: 98.4 F (36.9 C) 98.3 F (36.8 C)    TempSrc: Oral Oral    SpO2: 93% 94% 96% 96%  Weight:  61.2 kg    Height:        Intake/Output Summary (Last 24 hours) at 12/11/2021 0929 Last data filed at 12/11/2021 0700 Gross per 24 hour  Intake 338.41 ml  Output 1250 ml  Net -911.59 ml   [employer]  12/10/21 1545 12/11/21 0520  Weight: 61.8 kg 61.2 kg    Examination:   General: Appearance:    Thin female in no acute distress     Lungs:     Diminished at right base > left, no wheezing, respirations mildly labored when speaking but not at rest  Heart:    Normal heart rate. Normal rhythm. No murmurs, rubs, or gallops.   MS:   All extremities are intact.   Neurologic:    Awake, alert, oriented x 3. No apparent focal neurological           defect.        Data Reviewed:   CBC: Recent Labs  Lab 12/09/21 1900 12/10/21 0814 12/11/21 0319  WBC 8.7 10.2 8.9  NEUTROABS 6.8 9.9* 8.4*  HGB 16.8* 15.0 14.6  HCT 51.5* 46.7* 44.8  MCV 99.2 100.0 97.8  PLT 252 239 161    Basic Metabolic Panel: Recent Labs  Lab 12/09/21 1900 12/10/21 1604 12/11/21 0319  NA 138 138 140  K 3.9 4.0 4.1  CL 96* 97* 101  CO2 32 30 32  GLUCOSE 109* 203* 135*  BUN '17 14 12  '$ CREATININE 0.69 0.64 0.49  CALCIUM 9.1 9.1 9.2  MG  --  2.1 2.0    GFR: Estimated Creatinine Clearance: 65 mL/min (by C-G formula based on SCr of 0.49 mg/dL).  Liver Function Tests: Recent Labs  Lab 12/09/21 1900  AST 16  ALT 15  ALKPHOS 58  BILITOT 0.3  PROT 7.5  ALBUMIN 3.7    CBG: No results for input(s): "GLUCAP" in the last 168 hours.   Recent Results (from the past 240 hour(s))  Resp Panel by RT-PCR (Flu A&B, Covid) Anterior Nasal Swab     Status: None   Collection Time: 12/09/21  7:00 PM   Specimen: Anterior Nasal Swab  Result Value Ref Range Status   SARS Coronavirus 2 by RT PCR NEGATIVE NEGATIVE Final    Comment: (NOTE) SARS-CoV-2 target nucleic acids are NOT DETECTED.  The SARS-CoV-2 RNA is generally detectable in upper respiratory specimens during the acute phase of infection. The lowest concentration of SARS-CoV-2 viral copies this assay can detect is 138 copies/mL. A negative result does not preclude SARS-Cov-2 infection and should not be used as the sole basis for treatment or other patient management decisions. A negative result may occur with  improper specimen collection/handling, submission of specimen other than nasopharyngeal swab, presence of viral mutation(s) within the areas targeted by this assay, and inadequate number of viral copies(<138 copies/mL). A negative result must be combined with clinical observations, patient history, and  epidemiological information. The expected result is Negative.  Fact Sheet for Patients:  EntrepreneurPulse.com.au  Fact Sheet for Healthcare Providers:  IncredibleEmployment.be  This test is no t yet approved or cleared by the [place] FDA and  has been authorized for detection and/or diagnosis of SARS-CoV-2 by FDA under an Emergency Use Authorization (EUA). This EUA will remain  in effect (meaning this test can be used) for the duration of the COVID-19 declaration under Section 564(b)(1) of the Act, 21 U.S.C.section 360bbb-3(b)(1), unless the authorization is terminated  or revoked sooner.       Influenza A by PCR NEGATIVE NEGATIVE Final   Influenza B by PCR NEGATIVE NEGATIVE Final    Comment: (NOTE) The Xpert Xpress SARS-CoV-2/FLU/RSV plus assay is intended as an aid in the diagnosis of influenza from Nasopharyngeal swab specimens and should not be used as a sole basis  for treatment. Nasal washings and aspirates are unacceptable for Xpert Xpress SARS-CoV-2/FLU/RSV testing.  Fact Sheet for Patients: EntrepreneurPulse.com.au  Fact Sheet for Healthcare Providers: IncredibleEmployment.be  This test is not yet approved or cleared by the [place] FDA and has been authorized for detection and/or diagnosis of SARS-CoV-2 by FDA under an Emergency Use Authorization (EUA). This EUA will remain in effect (meaning this test can be used) for the duration of the COVID-19 declaration under Section 564(b)(1) of the Act, 21 U.S.C. section 360bbb-3(b)(1), unless the authorization is terminated or revoked.  Performed at [facility], [place] [address] [place], [place] [address]          Radiology Studies: CT Angio Chest PE W and/or Wo Contrast  Result Date: 12/09/2021 CLINICAL DATA:  Pulmonary embolism suspected, positive D-dimer. Shortness of breath for 3 weeks. EXAM: CT ANGIOGRAPHY CHEST WITH  CONTRAST TECHNIQUE: Multidetector CT imaging of the chest was performed using the standard protocol during bolus administration of intravenous contrast. Multiplanar CT image reconstructions and MIPs were obtained to evaluate the vascular anatomy. RADIATION DOSE REDUCTION: This exam was performed according to the departmental dose-optimization program which includes automated exposure control, adjustment of the mA and/or kV according to patient size and/or use of iterative reconstruction technique. CONTRAST:  47m OMNIPAQUE IOHEXOL 350 MG/ML SOLN COMPARISON:  03/10/2020. FINDINGS: Cardiovascular: The heart is mildly enlarged and there is no pericardial effusion. Multi-vessel coronary artery calcifications are noted. There is atherosclerotic calcification of the aorta without evidence of aneurysm. The pulmonary trunk is normal in caliber. No pulmonary artery filling defect is identified. Mediastinum/Nodes: Enlarged lymph nodes are present in the mediastinum measuring up to 1.2 cm in the precarinal space. There are prominent hilar lymph nodes. No axillary lymphadenopathy. The thyroid gland, trachea, and esophagus are within normal limits. Lungs/Pleura: Emphysematous changes are noted in the lungs bilaterally. There are small bilateral pleural effusions with atelectasis. Focal atelectasis is noted in the lingular segment of the left upper lobe. There is a nodule in the posterior segment of the right upper lobe measuring 5 mm, axial image 50. No pneumothorax. Upper Abdomen: No acute abnormality. Musculoskeletal: Surgical clips are noted in the right breast. Old healed rib fractures are noted on the right. No acute or suspicious osseous abnormality Review of the MIP images confirms the above findings. IMPRESSION: 1. No evidence of pulmonary embolism. 2. Small bilateral pleural effusions with atelectasis. 3. 5 mm right upper lobe pulmonary nodule, unchanged from 2021 and likely benign. 4. Emphysema. 5. Aortic  atherosclerosis and coronary artery calcifications. Electronically Signed   By: LBrett FairyM.D.   On: 12/09/2021 21:57   DG Chest Portable 1 View  Result Date: 12/09/2021 CLINICAL DATA:  Shortness of breath. EXAM: PORTABLE CHEST 1 VIEW COMPARISON:  January 05, 2019 FINDINGS: Calcific atherosclerotic disease of the aorta. Cardiomediastinal silhouette is normal. Mediastinal contours appear intact. Small left pleural effusion. Left lower lobe atelectasis versus airspace consolidation. Osseous structures are without acute abnormality. Soft tissues are grossly normal. IMPRESSION: Small left pleural effusion with left lower lobe atelectasis versus airspace consolidation. Electronically Signed   By: DFidela SalisburyM.D.   On: 12/09/2021 19:15        Scheduled Meds:  amLODipine  10 mg Oral Daily   arformoterol  15 mcg Nebulization BID   aspirin EC  81 mg Oral Daily   atorvastatin  10 mg Oral Daily   budesonide (PULMICORT) nebulizer solution  0.5 mg Nebulization BID   enoxaparin (LOVENOX) injection  40 mg Subcutaneous Q24H   feeding supplement  237 mL Oral BID BM   fluticasone  2 spray Each Nare Daily   guaiFENesin  1,200 mg Oral BID   ipratropium-albuterol  3 mL Nebulization QID   loratadine  10 mg Oral Daily   methylPREDNISolone (SOLU-MEDROL) injection  120 mg Intravenous Q12H   metoprolol tartrate  25 mg Oral BID   omega-3 acid ethyl esters  1 g Oral Daily   pantoprazole  40 mg Oral Q0600   cyanocobalamin  500 mcg Oral Daily   Continuous Infusions:  cefTRIAXone (ROCEPHIN)  IV 2 g (12/11/21 0852)     LOS: 1 day    Time spent: 40 minutes    Geradine Girt, DO [facility]   To contact the attending provider between 7A-7P or the covering provider during after hours 7P-7A, please log into the web site [email or web address] and access using universal [place] password for that web site. If you do not have the password, please call the hospital operator.  12/11/2021, 9:29 AM

## 2021-12-11 NOTE — TOC Progression Note (Addendum)
Transition of Care [facility]) - Progression Note    Patient Details  Name: Caroline Greer MRN: [id number] Date of Birth: Oct 06, 1955  [facility] CM/SW Contact  Zenon Mayo, RN Phone Number: 12/11/2021, 3:34 PM  Clinical Narrative:    from home COPD Ex, indep, has home oxygen 3 liters with [employer] currently on 7 liters, conts on iv abx, iv steroids. Wants portable oxygen tank.  Order in for OCD eval. [employer] will evaluate the patient for this at home. TOC following.         Expected Discharge Plan and Services                                                 Social Determinants of Health (SDOH) Interventions    Readmission Risk Interventions     No data to display

## 2021-12-11 NOTE — Progress Notes (Signed)
Initial Nutrition Assessment  DOCUMENTATION CODES:   Non-severe (moderate) malnutrition in context of chronic illness  INTERVENTION:  Liberalize diet from a 2 gram sodium to a regular diet to provide widest variety of menu options to enhance nutritional adequacy Discontinue Ensure Enlive Boost Breeze po TID, each supplement provides 250 kcal and 9 grams of protein  NUTRITION DIAGNOSIS:   Moderate Malnutrition related to chronic illness (breast cancer, COPD) as evidenced by mild fat depletion, mild muscle depletion, moderate muscle depletion.  GOAL:   Patient will meet greater than or equal to 90% of their needs  MONITOR:   PO intake, Supplement acceptance, Diet advancement, Labs, Weight trends  REASON FOR ASSESSMENT:   Malnutrition Screening Tool    ASSESSMENT:   Pt admitted from home with SOB and cough related to COPD exacerbation. PMH significant for breast cancer undergoing XRT, COPD, chronic respiratory failure on 3L O2  Food allergies: shellfish  Pt eating lunch at time of visit. States that she has had poor PO intake since December when she was diagnosed with breast cancer and XRT initiated. She had low energy and poor appetite so she had not been eating much, if anything. She reports smoking cessation x1 month and has had a significant increase in her appetite and PO intake. She recalls just PTA eating beef stew with rice, salmon with onions and peas, jello, ice cream, cookies, oatmeal with coffee and a waffle within a day. At home she is mindful and uses very little salt to season her food. She reports needing a little extra salt and sauces to help with flavor and ease of chewing. Recently, she lost a front tooth eating pork and has been unable to get it fixed as she has been undergoing radiation. Her last round was in May and she is hopeful to get her teeth fixed. She feels as though she is able to order items that she can chew easily and does not want modified textures at  this time.   She reports always being small her whole life. Her wt has remained steady within the last 6 months around 130 lbs and endorses prior significant wt loss. Reviewed wt history. Since January, her wt appears to remain stable between 59-61 kg. Current admit wt noted to be 61.2 kg. Will continue to monitor throughout admission.   Pt endorses difficulty with constipation. Her home remedy includes milk of magnesia and water and was requesting during admission. Spoke with RN about pt's request.   Spoke with pt about nutrition supplements and she was agreeable to receive them, however noted in [facility], pt not taking. Will adjust supplement to [facility] and see if these are more suitable for pt.   Medications: IV solu-medrol, lovaxa, protonix, Vitamin B12, IV abx  Labs: reviewed  NUTRITION - FOCUSED PHYSICAL EXAM:  Flowsheet Row Most Recent Value  Orbital Region Mild depletion  Upper Arm Region Mild depletion  Thoracic and Lumbar Region No depletion  Buccal Region No depletion  Temple Region No depletion  Clavicle Bone Region Mild depletion  Clavicle and Acromion Bone Region Mild depletion  Scapular Bone Region Mild depletion  Dorsal Hand Mild depletion  Patellar Region Moderate depletion  Anterior Thigh Region Moderate depletion  Posterior Calf Region Moderate depletion  Edema (RD Assessment) None  Hair Reviewed  Eyes Reviewed  Mouth Other (Comment)  [missing front tooth]  Skin Reviewed  Nails Reviewed      Diet Order:   Diet Order  Diet regular Room service appropriate? Yes; Fluid consistency: Thin  Diet effective now                   EDUCATION NEEDS:   Education needs have been addressed  Skin:  Skin Assessment: Reviewed RN Assessment  Last BM:  7/17  Height:   Ht Readings from Last 1 Encounters:  12/10/21 5' 10.75" (1.797 m)    Weight:   Wt Readings from Last 1 Encounters:  12/11/21 61.2 kg   BMI:  Body mass index is 18.95  kg/m.  Estimated Nutritional Needs:   Kcal:  1800-2000  Protein:  85-100g  Fluid:  >/=1.8L  Clayborne Dana, RDN, LDN Clinical Nutrition

## 2021-12-12 DIAGNOSIS — J9621 Acute and chronic respiratory failure with hypoxia: Secondary | ICD-10-CM | POA: Diagnosis not present

## 2021-12-12 DIAGNOSIS — I10 Essential (primary) hypertension: Secondary | ICD-10-CM | POA: Diagnosis not present

## 2021-12-12 DIAGNOSIS — J9611 Chronic respiratory failure with hypoxia: Secondary | ICD-10-CM | POA: Diagnosis not present

## 2021-12-12 DIAGNOSIS — J441 Chronic obstructive pulmonary disease with (acute) exacerbation: Secondary | ICD-10-CM | POA: Diagnosis not present

## 2021-12-12 LAB — CBC
HCT: 44.1 % (ref 36.0–46.0)
Hemoglobin: 14.2 g/dL (ref 12.0–15.0)
MCH: 32 pg (ref 26.0–34.0)
MCHC: 32.2 g/dL (ref 30.0–36.0)
MCV: 99.3 fL (ref 80.0–100.0)
Platelets: 260 10*3/uL (ref 150–400)
RBC: 4.44 MIL/uL (ref 3.87–5.11)
RDW: 13.4 % (ref 11.5–15.5)
WBC: 14.5 10*3/uL — ABNORMAL HIGH (ref 4.0–10.5)
nRBC: 0 % (ref 0.0–0.2)

## 2021-12-12 LAB — BASIC METABOLIC PANEL
Anion gap: 7 (ref 5–15)
BUN: 16 mg/dL (ref 8–23)
CO2: 31 mmol/L (ref 22–32)
Calcium: 8.6 mg/dL — ABNORMAL LOW (ref 8.9–10.3)
Chloride: 97 mmol/L — ABNORMAL LOW (ref 98–111)
Creatinine, Ser: 0.62 mg/dL (ref 0.44–1.00)
GFR, Estimated: >60 mL/min (ref >60)
Glucose, Bld: 138 mg/dL — ABNORMAL HIGH (ref 70–99)
Potassium: 4.2 mmol/L (ref 3.5–5.1)
Sodium: 135 mmol/L (ref 135–145)

## 2021-12-12 MED ORDER — METHYLPREDNISOLONE SODIUM SUCC 125 MG IJ SOLR
120.0000 mg | Freq: Every day | INTRAMUSCULAR | Status: DC
  Administered 2021-12-13: 120 mg via INTRAVENOUS
  Filled 2021-12-12: qty 2

## 2021-12-12 MED ORDER — SORBITOL 70 % SOLN
30.0000 mL | Status: AC
  Administered 2021-12-12: 30 mL via ORAL
  Filled 2021-12-12: qty 30

## 2021-12-12 MED ORDER — IPRATROPIUM-ALBUTEROL 0.5-2.5 (3) MG/3ML IN SOLN
3.0000 mL | Freq: Two times a day (BID) | RESPIRATORY_TRACT | Status: DC
  Administered 2021-12-12 – 2021-12-15 (×6): 3 mL via RESPIRATORY_TRACT
  Filled 2021-12-12 (×6): qty 3

## 2021-12-12 NOTE — Progress Notes (Signed)
PROGRESS NOTE    Caroline Greer  EYC:[id number] DOB: 07-07-1955 DOA: 12/09/2021 PCP: [name], MD    Chief Complaint  Patient presents with   Shortness of Breath    Brief Narrative:  Patient is a pleasant 66 year old African-American female, history of breast cancer undergoing XRT, chronic respiratory failure on 3 L nasal cannula home O2 secondary to COPD presented to the ED with worsening shortness of breath x3 days.  Stated went to see PCP 2 weeks ago and was exposed to smoke from [place] while she was sitting outside waiting for her car at the [place] station.  Patient stated was given some doxycycline however after completion of doxycycline symptoms worsened.  Patient stated completed a round of XRT for breast cancer.  Patient presented to the ED noted to be hypoxic with sats of 81% on 4 L nasal cannula requiring 10 L/min and subsequently 100% nonrebreather.  CT angiogram chest done negative for PE and pneumonia.  Patient admitted for acute on chronic respiratory failure secondary to acute COPD exacerbation.    Assessment & Plan:  Principal Problem:   COPD exacerbation ([place]) Active Problems:   Acute on chronic respiratory failure with hypoxia (HCC)   Chronic respiratory failure with hypoxia, on home O2 therapy (HCC) - 3 L/min   Essential hypertension   Malignant neoplasm of upper-outer quadrant of right breast in female, estrogen receptor positive (HCC)   Malnutrition of moderate degree    Assessment and Plan: * COPD exacerbation ([place]) -Patient presented in acute COPD exacerbation.   -Patient needed 100% nonrebreather, requiring 10 L/min nasal cannula.  -Patient was requiring 10 L/min nasal cannula with improvement with O2 requirements currently down to 5 L/min. -Patient initially received IV Solu-Medrol and subsequently placed on oral prednisone 40 mg daily.  -Due to increasing O2 requirements and some use of accessory muscles of respiration was placed back on IV Solu-Medrol,  Pulmicort, Brovana, Flonase, Claritin, PPI, IV Rocephin. -Decrease IV Solu-Medrol to 60 mg every 12 hours.   -Continue supportive care.  COPD exacerbation ([place]) is a Acute on Chronic illness/condition that poses a threat to life or bodily function.   Acute on chronic respiratory failure with hypoxia (HCC) Acutely exacerbated.  Baseline home O2 is 3 L a minute.  -O2 requirements improving currently on 5 L/min, with clinical improvement. -Decrease IV Solu-Medrol to 60 mg every 12 hours -Continue Pulmicort, Brovana, Flonase, Claritin, PPI, IV Rocephin, scheduled DuoNebs. -See above COPD exacerbation.  Malignant neoplasm of upper-outer quadrant of right breast in female, estrogen receptor positive ([place]) Currently receiving external beam radiation. -Oncology informed via epic of patient's admission.   Essential hypertension Stable.   Continue home regimen Norvasc, Lopressor.  Chronic respiratory failure with hypoxia, on home O2 therapy (HCC) - 3 L/min Acutely worsened however improving.   -Chronically on 3 L a minute at home.         DVT prophylaxis: Lovenox Code Status: Full Family Communication: Updated patient.  No family at bedside. Disposition: Likely home when clinically improved and back to baseline chronic home O2 requirements.  Status is: Inpatient The patient will require care spanning > 2 midnights and should be moved to inpatient because: Severity of illness   Consultants:  None  Procedures:  CT angiogram chest 12/09/2021 Chest x-ray 12/09/2021   Antimicrobials:  IV Rocephin 12/10/2021>>>>   Subjective: Laying in bed.  States overall feeling much better.  Denies any chest pain.  Shortness of breath improving.  O2 requirements improving currently on 5 L nasal  cannula with sats of 96%.  Denies any chest pain.  States increased appetite.    Objective: Vitals:   12/12/21 0500 12/12/21 0810 12/12/21 0832 12/12/21 0918  BP:    113/70  Pulse:   84 93  Resp:    18   Temp:    98.7 F (37.1 C)  TempSrc:    Oral  SpO2:  94% 95% 90%  Weight: 61.3 kg     Height:        Intake/Output Summary (Last 24 hours) at 12/12/2021 1802 Last data filed at 12/12/2021 1200 Gross per 24 hour  Intake 1680 ml  Output --  Net 1680 ml   Filed Weights   12/10/21 1545 12/11/21 0520 12/12/21 0500  Weight: 61.8 kg 61.2 kg 61.3 kg    Examination:  General exam: NAD Respiratory system: Fair air movement.  Improved decreased breath sounds in the bases.  No significant wheezing noted.  No crackles.  No significant rhonchi.  Speaking in full sentences.  No use of accessory muscles of respiration. Cardiovascular system: Regular rate rhythm no murmurs rubs or gallops.  No JVD.  No lower extremity edema.  Gastrointestinal system: Abdomen is soft, nontender, nondistended, positive bowel sounds.  No rebound.  No guarding. Central nervous system: Alert and oriented. No focal neurological deficits. Extremities: Symmetric 5 x 5 power. Skin: No rashes, lesions or ulcers Psychiatry: Judgement and insight appear normal. Mood & affect appropriate.     Data Reviewed:   CBC: Recent Labs  Lab 12/09/21 1900 12/10/21 0814 12/11/21 0319 12/12/21 0304  WBC 8.7 10.2 8.9 14.5*  NEUTROABS 6.8 9.9* 8.4*  --   HGB 16.8* 15.0 14.6 14.2  HCT 51.5* 46.7* 44.8 44.1  MCV 99.2 100.0 97.8 99.3  PLT 252 239 222 696    Basic Metabolic Panel: Recent Labs  Lab 12/09/21 1900 12/10/21 1604 12/11/21 0319 12/12/21 0304  NA 138 138 140 135  K 3.9 4.0 4.1 4.2  CL 96* 97* 101 97*  CO2 32 30 32 31  GLUCOSE 109* 203* 135* 138*  BUN '17 14 12 16  '$ CREATININE 0.69 0.64 0.49 0.62  CALCIUM 9.1 9.1 9.2 8.6*  MG  --  2.1 2.0  --     GFR: Estimated Creatinine Clearance: 65.1 mL/min (by C-G formula based on SCr of 0.62 mg/dL).  Liver Function Tests: Recent Labs  Lab 12/09/21 1900  AST 16  ALT 15  ALKPHOS 58  BILITOT 0.3  PROT 7.5  ALBUMIN 3.7    CBG: No results for input(s):  "GLUCAP" in the last 168 hours.   Recent Results (from the past 240 hour(s))  Resp Panel by RT-PCR (Flu A&B, Covid) Anterior Nasal Swab     Status: None   Collection Time: 12/09/21  7:00 PM   Specimen: Anterior Nasal Swab  Result Value Ref Range Status   SARS Coronavirus 2 by RT PCR NEGATIVE NEGATIVE Final    Comment: (NOTE) SARS-CoV-2 target nucleic acids are NOT DETECTED.  The SARS-CoV-2 RNA is generally detectable in upper respiratory specimens during the acute phase of infection. The lowest concentration of SARS-CoV-2 viral copies this assay can detect is 138 copies/mL. A negative result does not preclude SARS-Cov-2 infection and should not be used as the sole basis for treatment or other patient management decisions. A negative result may occur with  improper specimen collection/handling, submission of specimen other than nasopharyngeal swab, presence of viral mutation(s) within the areas targeted by this assay, and inadequate number of viral  copies(<138 copies/mL). A negative result must be combined with clinical observations, patient history, and epidemiological information. The expected result is Negative.  Fact Sheet for Patients:  EntrepreneurPulse.com.au  Fact Sheet for Healthcare Providers:  IncredibleEmployment.be  This test is no t yet approved or cleared by the [place] FDA and  has been authorized for detection and/or diagnosis of SARS-CoV-2 by FDA under an Emergency Use Authorization (EUA). This EUA will remain  in effect (meaning this test can be used) for the duration of the COVID-19 declaration under Section 564(b)(1) of the Act, 21 U.S.C.section 360bbb-3(b)(1), unless the authorization is terminated  or revoked sooner.       Influenza A by PCR NEGATIVE NEGATIVE Final   Influenza B by PCR NEGATIVE NEGATIVE Final    Comment: (NOTE) The Xpert Xpress SARS-CoV-2/FLU/RSV plus assay is intended as an aid in the  diagnosis of influenza from Nasopharyngeal swab specimens and should not be used as a sole basis for treatment. Nasal washings and aspirates are unacceptable for Xpert Xpress SARS-CoV-2/FLU/RSV testing.  Fact Sheet for Patients: EntrepreneurPulse.com.au  Fact Sheet for Healthcare Providers: IncredibleEmployment.be  This test is not yet approved or cleared by the [place] FDA and has been authorized for detection and/or diagnosis of SARS-CoV-2 by FDA under an Emergency Use Authorization (EUA). This EUA will remain in effect (meaning this test can be used) for the duration of the COVID-19 declaration under Section 564(b)(1) of the Act, 21 U.S.C. section 360bbb-3(b)(1), unless the authorization is terminated or revoked.  Performed at [facility], [place] [address] [place], [place] [address]   Respiratory (~20 pathogens) panel by PCR     Status: None   Collection Time: 12/11/21 10:18 AM   Specimen: Nasopharyngeal Swab; Respiratory  Result Value Ref Range Status   Adenovirus NOT DETECTED NOT DETECTED Final   Coronavirus 229E NOT DETECTED NOT DETECTED Final    Comment: (NOTE) The Coronavirus on the Respiratory Panel, DOES NOT test for the novel  Coronavirus (2019 nCoV)    Coronavirus HKU1 NOT DETECTED NOT DETECTED Final   Coronavirus NL63 NOT DETECTED NOT DETECTED Final   Coronavirus OC43 NOT DETECTED NOT DETECTED Final   Metapneumovirus NOT DETECTED NOT DETECTED Final   Rhinovirus / Enterovirus NOT DETECTED NOT DETECTED Final   Influenza A NOT DETECTED NOT DETECTED Final   Influenza B NOT DETECTED NOT DETECTED Final   Parainfluenza Virus 1 NOT DETECTED NOT DETECTED Final   Parainfluenza Virus 2 NOT DETECTED NOT DETECTED Final   Parainfluenza Virus 3 NOT DETECTED NOT DETECTED Final   Parainfluenza Virus 4 NOT DETECTED NOT DETECTED Final   Respiratory Syncytial Virus NOT DETECTED NOT DETECTED Final   Bordetella pertussis NOT DETECTED  NOT DETECTED Final   Bordetella Parapertussis NOT DETECTED NOT DETECTED Final   Chlamydophila pneumoniae NOT DETECTED NOT DETECTED Final   Mycoplasma pneumoniae NOT DETECTED NOT DETECTED Final    Comment: Performed at [facility], [place]. [address] [place], [place] [address]         Radiology Studies: No results found.      Scheduled Meds:  amLODipine  10 mg Oral Daily   arformoterol  15 mcg Nebulization BID   aspirin EC  81 mg Oral Daily   atorvastatin  10 mg Oral Daily   budesonide (PULMICORT) nebulizer solution  0.5 mg Nebulization BID   enoxaparin (LOVENOX) injection  40 mg Subcutaneous Q24H   feeding supplement  1 Container Oral TID BM   fluticasone  2  spray Each Nare Daily   guaiFENesin  1,200 mg Oral BID   ipratropium-albuterol  3 mL Nebulization BID   loratadine  10 mg Oral Daily   methylPREDNISolone (SOLU-MEDROL) injection  120 mg Intravenous Q12H   metoprolol tartrate  25 mg Oral BID   omega-3 acid ethyl esters  1 g Oral Daily   pantoprazole  40 mg Oral Q0600   cyanocobalamin  500 mcg Oral Daily   Continuous Infusions:  cefTRIAXone (ROCEPHIN)  IV 2 g (12/12/21 0933)     LOS: 2 days    Time spent: 40 minutes    Irine Seal, MD [facility]   To contact the attending provider between 7A-7P or the covering provider during after hours 7P-7A, please log into the web site [email or web address] and access using universal [place] password for that web site. If you do not have the password, please call the hospital operator.  12/12/2021, 6:02 PM

## 2021-12-12 NOTE — Plan of Care (Signed)
  Problem: Education: Goal: Knowledge of disease or condition will improve Outcome: Progressing   Problem: Education: Goal: Individualized Educational Video(s) Outcome: Progressing   Problem: Activity: Goal: Ability to tolerate increased activity will improve Outcome: Progressing   Problem: Activity: Goal: Will verbalize the importance of balancing activity with adequate rest periods Outcome: Progressing

## 2021-12-13 DIAGNOSIS — J441 Chronic obstructive pulmonary disease with (acute) exacerbation: Secondary | ICD-10-CM | POA: Diagnosis not present

## 2021-12-13 DIAGNOSIS — I10 Essential (primary) hypertension: Secondary | ICD-10-CM | POA: Diagnosis not present

## 2021-12-13 DIAGNOSIS — J9611 Chronic respiratory failure with hypoxia: Secondary | ICD-10-CM | POA: Diagnosis not present

## 2021-12-13 DIAGNOSIS — J9621 Acute and chronic respiratory failure with hypoxia: Secondary | ICD-10-CM | POA: Diagnosis not present

## 2021-12-13 LAB — CBC WITH DIFFERENTIAL/PLATELET
Abs Immature Granulocytes: 0.05 10*3/uL (ref 0.00–0.07)
Basophils Absolute: 0 10*3/uL (ref 0.0–0.1)
Basophils Relative: 0 %
Eosinophils Absolute: 0 10*3/uL (ref 0.0–0.5)
Eosinophils Relative: 0 %
HCT: 45.3 % (ref 36.0–46.0)
Hemoglobin: 14.6 g/dL (ref 12.0–15.0)
Immature Granulocytes: 0 %
Lymphocytes Relative: 8 %
Lymphs Abs: 1 10*3/uL (ref 0.7–4.0)
MCH: 31.6 pg (ref 26.0–34.0)
MCHC: 32.2 g/dL (ref 30.0–36.0)
MCV: 98.1 fL (ref 80.0–100.0)
Monocytes Absolute: 1 10*3/uL (ref 0.1–1.0)
Monocytes Relative: 8 %
Neutro Abs: 11.1 10*3/uL — ABNORMAL HIGH (ref 1.7–7.7)
Neutrophils Relative %: 84 %
Platelets: 251 10*3/uL (ref 150–400)
RBC: 4.62 MIL/uL (ref 3.87–5.11)
RDW: 13.5 % (ref 11.5–15.5)
WBC: 13.2 10*3/uL — ABNORMAL HIGH (ref 4.0–10.5)
nRBC: 0 % (ref 0.0–0.2)

## 2021-12-13 LAB — BASIC METABOLIC PANEL
Anion gap: 7 (ref 5–15)
BUN: 16 mg/dL (ref 8–23)
CO2: 33 mmol/L — ABNORMAL HIGH (ref 22–32)
Calcium: 8.7 mg/dL — ABNORMAL LOW (ref 8.9–10.3)
Chloride: 99 mmol/L (ref 98–111)
Creatinine, Ser: 0.5 mg/dL (ref 0.44–1.00)
GFR, Estimated: >60 mL/min (ref >60)
Glucose, Bld: 85 mg/dL (ref 70–99)
Potassium: 4.3 mmol/L (ref 3.5–5.1)
Sodium: 139 mmol/L (ref 135–145)

## 2021-12-13 MED ORDER — PREDNISONE 50 MG PO TABS
60.0000 mg | ORAL_TABLET | Freq: Every day | ORAL | Status: DC
  Administered 2021-12-14 – 2021-12-15 (×2): 60 mg via ORAL
  Filled 2021-12-13 (×2): qty 1

## 2021-12-13 MED ORDER — CEFDINIR 300 MG PO CAPS
300.0000 mg | ORAL_CAPSULE | Freq: Two times a day (BID) | ORAL | Status: AC
  Administered 2021-12-14 (×2): 300 mg via ORAL
  Filled 2021-12-13 (×2): qty 1

## 2021-12-13 MED ORDER — FUROSEMIDE 10 MG/ML IJ SOLN
20.0000 mg | Freq: Once | INTRAMUSCULAR | Status: AC
Start: 2021-12-13 — End: 2021-12-13
  Administered 2021-12-13: 20 mg via INTRAVENOUS
  Filled 2021-12-13: qty 2

## 2021-12-13 NOTE — Progress Notes (Signed)
Patient ambulated in hall with 5L 02. Sats dropped to 79%. Patient showed no signs of distress. O2 sats returned to normal after a few minuets of rest.

## 2021-12-13 NOTE — Plan of Care (Signed)
  Problem: Education: Goal: Knowledge of disease or condition will improve Outcome: Progressing   Problem: Activity: Goal: Will verbalize the importance of balancing activity with adequate rest periods Outcome: Progressing   Problem: Respiratory: Goal: Levels of oxygenation will improve Outcome: Progressing   Problem: Respiratory: Goal: Ability to maintain a clear airway will improve Outcome: Progressing   Problem: Respiratory: Goal: Ability to maintain adequate ventilation will improve Outcome: Progressing   Problem: Education: Goal: Knowledge of General Education information will improve Description: Including pain rating scale, medication(s)/side effects and non-pharmacologic comfort measures Outcome: Progressing   Problem: Nutrition: Goal: Adequate nutrition will be maintained Outcome: Progressing   Problem: Activity: Goal: Risk for activity intolerance will decrease Outcome: Progressing   Problem: Coping: Goal: Level of anxiety will decrease Outcome: Progressing   Problem: Safety: Goal: Ability to remain free from injury will improve Outcome: Progressing

## 2021-12-13 NOTE — Progress Notes (Addendum)
PROGRESS NOTE    Caroline Greer  ZOX:[id number] DOB: Aug 23, 1955 DOA: 12/09/2021 PCP: [name], MD    Chief Complaint  Patient presents with   Shortness of Breath    Brief Narrative:  Patient is a pleasant 66 year old African-American female, history of breast cancer undergoing XRT, chronic respiratory failure on 3 L nasal cannula home O2 secondary to COPD presented to the ED with worsening shortness of breath x3 days.  Stated went to see PCP 2 weeks ago and was exposed to smoke from [place] while she was sitting outside waiting for her car at the [place].  Patient stated was given some doxycycline however after completion of doxycycline symptoms worsened.  Patient stated completed a round of XRT for breast cancer.  Patient presented to the ED noted to be hypoxic with sats of 81% on 4 L nasal cannula requiring 10 L/min and subsequently 100% nonrebreather.  CT angiogram chest done negative for PE and pneumonia.  Patient admitted for acute on chronic respiratory failure secondary to acute COPD exacerbation.    Assessment & Plan:  Principal Problem:   COPD exacerbation ([place]) Active Problems:   Acute on chronic respiratory failure with hypoxia (HCC)   Chronic respiratory failure with hypoxia, on home O2 therapy (HCC) - 3 L/min   Essential hypertension   Malignant neoplasm of upper-outer quadrant of right breast in female, estrogen receptor positive (HCC)   Malnutrition of moderate degree    Assessment and Plan: * COPD exacerbation ([place]) -Patient presented in acute COPD exacerbation.   -Patient needed 100% nonrebreather, requiring 10 L/min nasal cannula.  -Patient was requiring 10 L/min nasal cannula with improvement with O2 requirements currently down to 5 L/min. -Patient initially received IV Solu-Medrol and subsequently placed on oral prednisone 40 mg daily.  -Due to increasing O2 requirements and some use of accessory muscles of respiration was placed back on IV Solu-Medrol,  Pulmicort, Brovana, Flonase, Claritin, PPI, IV Rocephin. -Transition from IV Solu-Medrol to oral prednisone 60 mg daily with steroid taper. -Supportive care.  COPD exacerbation ([place]) is a Acute on Chronic illness/condition that poses a threat to life or bodily function.   Acute on chronic respiratory failure with hypoxia (HCC) Acutely exacerbated.   -Baseline home O2 is 3 L a minute.  -O2 requirements improving currently on 4 L/min, with clinical improvement. -Transition from IV Solu-Medrol to oral prednisone 60 mg daily and taper.  -Continue Pulmicort, Brovana, Flonase, Claritin, PPI, IV Rocephin, scheduled DuoNebs. -Transition from IV Rocephin to oral Omnicef tomorrow. -Patient noted still to have some hypoxia on ambulation on 12/12/2021 and as such we will give Lasix 20 mg IV x1. -See above COPD exacerbation.  Malignant neoplasm of upper-outer quadrant of right breast in female, estrogen receptor positive ([place]) Currently receiving external beam radiation. -Oncology informed via epic of patient's admission.   Essential hypertension Stable.   Continue home regimen Norvasc, Lopressor.  Chronic respiratory failure with hypoxia, on home O2 therapy (HCC) - 3 L/min Acutely worsened however improving.   -Chronically on 3 L a minute at home.         DVT prophylaxis: Lovenox Code Status: Full Family Communication: Updated patient.  No family at bedside. Disposition: Likely home when clinically improved and back to baseline chronic home O2 requirements hopefully in the next 24 hours.  Status is: Inpatient The patient will require care spanning > 2 midnights and should be moved to inpatient because: Severity of illness   Consultants:  None  Procedures:  CT angiogram chest  12/09/2021 Chest x-ray 12/09/2021   Antimicrobials:  IV Rocephin 12/10/2021>>>> 12/13/2021 Omnicef 12/14/2021>>> 12/15/2021   Subjective: Patient laying in bed.  Feeling better.  Denies any chest pain.   Shortness of breath improved.  Currently on 4 L nasal cannula with sats ranging anywhere from 99 to 93%.  Hoping to be able to go home today.   Stated had bowel movement..    Objective: Vitals:   12/13/21 0600 12/13/21 0730 12/13/21 0930 12/13/21 0932  BP: (!) 144/97  134/80   Pulse: 81 82 (!) 103 89  Resp: (!) 24  20   Temp: 98.4 F (36.9 C)     TempSrc: Oral     SpO2: 92% 97% 91%   Weight: 63.3 kg     Height:        Intake/Output Summary (Last 24 hours) at 12/13/2021 1529 Last data filed at 12/13/2021 1029 Gross per 24 hour  Intake 100 ml  Output 800 ml  Net -700 ml   Filed Weights   12/11/21 0520 12/12/21 0500 12/13/21 0600  Weight: 61.2 kg 61.3 kg 63.3 kg    Examination:  General exam: NAD Respiratory system: Fair air movement.  No wheezes, no crackles, no significant rhonchi noted.  Speaking in full sentences.  Cardiovascular system: Regular rate rhythm no murmurs rubs or gallops.  No JVD.  No lower extremity edema.  Gastrointestinal system: Abdomen is soft, nontender, nondistended, positive bowel sounds.  No rebound.  No guarding.   Central nervous system: Alert and oriented. No focal neurological deficits. Extremities: Symmetric 5 x 5 power. Skin: No rashes, lesions or ulcers Psychiatry: Judgement and insight appear normal. Mood & affect appropriate.     Data Reviewed:   CBC: Recent Labs  Lab 12/09/21 1900 12/10/21 0814 12/11/21 0319 12/12/21 0304 12/13/21 0446  WBC 8.7 10.2 8.9 14.5* 13.2*  NEUTROABS 6.8 9.9* 8.4*  --  11.1*  HGB 16.8* 15.0 14.6 14.2 14.6  HCT 51.5* 46.7* 44.8 44.1 45.3  MCV 99.2 100.0 97.8 99.3 98.1  PLT 252 239 222 260 160    Basic Metabolic Panel: Recent Labs  Lab 12/09/21 1900 12/10/21 1604 12/11/21 0319 12/12/21 0304 12/13/21 0446  NA 138 138 140 135 139  K 3.9 4.0 4.1 4.2 4.3  CL 96* 97* 101 97* 99  CO2 32 30 32 31 33*  GLUCOSE 109* 203* 135* 138* 85  BUN '17 14 12 16 16  '$ CREATININE 0.69 0.64 0.49 0.62 0.50   CALCIUM 9.1 9.1 9.2 8.6* 8.7*  MG  --  2.1 2.0  --   --     GFR: Estimated Creatinine Clearance: 67.3 mL/min (by C-G formula based on SCr of 0.5 mg/dL).  Liver Function Tests: Recent Labs  Lab 12/09/21 1900  AST 16  ALT 15  ALKPHOS 58  BILITOT 0.3  PROT 7.5  ALBUMIN 3.7    CBG: No results for input(s): "GLUCAP" in the last 168 hours.   Recent Results (from the past 240 hour(s))  Resp Panel by RT-PCR (Flu A&B, Covid) Anterior Nasal Swab     Status: None   Collection Time: 12/09/21  7:00 PM   Specimen: Anterior Nasal Swab  Result Value Ref Range Status   SARS Coronavirus 2 by RT PCR NEGATIVE NEGATIVE Final    Comment: (NOTE) SARS-CoV-2 target nucleic acids are NOT DETECTED.  The SARS-CoV-2 RNA is generally detectable in upper respiratory specimens during the acute phase of infection. The lowest concentration of SARS-CoV-2 viral copies this assay can detect  is 138 copies/mL. A negative result does not preclude SARS-Cov-2 infection and should not be used as the sole basis for treatment or other patient management decisions. A negative result may occur with  improper specimen collection/handling, submission of specimen other than nasopharyngeal swab, presence of viral mutation(s) within the areas targeted by this assay, and inadequate number of viral copies(<138 copies/mL). A negative result must be combined with clinical observations, patient history, and epidemiological information. The expected result is Negative.  Fact Sheet for Patients:  EntrepreneurPulse.com.au  Fact Sheet for Healthcare Providers:  IncredibleEmployment.be  This test is no t yet approved or cleared by the [place] FDA and  has been authorized for detection and/or diagnosis of SARS-CoV-2 by FDA under an Emergency Use Authorization (EUA). This EUA will remain  in effect (meaning this test can be used) for the duration of the COVID-19 declaration under  Section 564(b)(1) of the Act, 21 U.S.C.section 360bbb-3(b)(1), unless the authorization is terminated  or revoked sooner.       Influenza A by PCR NEGATIVE NEGATIVE Final   Influenza B by PCR NEGATIVE NEGATIVE Final    Comment: (NOTE) The Xpert Xpress SARS-CoV-2/FLU/RSV plus assay is intended as an aid in the diagnosis of influenza from Nasopharyngeal swab specimens and should not be used as a sole basis for treatment. Nasal washings and aspirates are unacceptable for Xpert Xpress SARS-CoV-2/FLU/RSV testing.  Fact Sheet for Patients: EntrepreneurPulse.com.au  Fact Sheet for Healthcare Providers: IncredibleEmployment.be  This test is not yet approved or cleared by the [place] FDA and has been authorized for detection and/or diagnosis of SARS-CoV-2 by FDA under an Emergency Use Authorization (EUA). This EUA will remain in effect (meaning this test can be used) for the duration of the COVID-19 declaration under Section 564(b)(1) of the Act, 21 U.S.C. section 360bbb-3(b)(1), unless the authorization is terminated or revoked.  Performed at [facility], [place] [address] [place], [place] [address]   Respiratory (~20 pathogens) panel by PCR     Status: None   Collection Time: 12/11/21 10:18 AM   Specimen: Nasopharyngeal Swab; Respiratory  Result Value Ref Range Status   Adenovirus NOT DETECTED NOT DETECTED Final   Coronavirus 229E NOT DETECTED NOT DETECTED Final    Comment: (NOTE) The Coronavirus on the Respiratory Panel, DOES NOT test for the novel  Coronavirus (2019 nCoV)    Coronavirus HKU1 NOT DETECTED NOT DETECTED Final   Coronavirus NL63 NOT DETECTED NOT DETECTED Final   Coronavirus OC43 NOT DETECTED NOT DETECTED Final   Metapneumovirus NOT DETECTED NOT DETECTED Final   Rhinovirus / Enterovirus NOT DETECTED NOT DETECTED Final   Influenza A NOT DETECTED NOT DETECTED Final   Influenza B NOT DETECTED NOT DETECTED Final    Parainfluenza Virus 1 NOT DETECTED NOT DETECTED Final   Parainfluenza Virus 2 NOT DETECTED NOT DETECTED Final   Parainfluenza Virus 3 NOT DETECTED NOT DETECTED Final   Parainfluenza Virus 4 NOT DETECTED NOT DETECTED Final   Respiratory Syncytial Virus NOT DETECTED NOT DETECTED Final   Bordetella pertussis NOT DETECTED NOT DETECTED Final   Bordetella Parapertussis NOT DETECTED NOT DETECTED Final   Chlamydophila pneumoniae NOT DETECTED NOT DETECTED Final   Mycoplasma pneumoniae NOT DETECTED NOT DETECTED Final    Comment: Performed at [facility], [place], [place], [place] [address]         Radiology Studies: No results found.      Scheduled Meds:  amLODipine  10 mg Oral Daily  arformoterol  15 mcg Nebulization BID   aspirin EC  81 mg Oral Daily   atorvastatin  10 mg Oral Daily   budesonide (PULMICORT) nebulizer solution  0.5 mg Nebulization BID   [START ON 12/14/2021] cefdinir  300 mg Oral Q12H   enoxaparin (LOVENOX) injection  40 mg Subcutaneous Q24H   feeding supplement  1 Container Oral TID BM   fluticasone  2 spray Each Nare Daily   guaiFENesin  1,200 mg Oral BID   ipratropium-albuterol  3 mL Nebulization BID   loratadine  10 mg Oral Daily   metoprolol tartrate  25 mg Oral BID   omega-3 acid ethyl esters  1 g Oral Daily   pantoprazole  40 mg Oral Q0600   [START ON 12/14/2021] predniSONE  60 mg Oral QAC breakfast   cyanocobalamin  500 mcg Oral Daily   Continuous Infusions:     LOS: 3 days    Time spent: 40 minutes    Irine Seal, MD [facility]   To contact the attending provider between 7A-7P or the covering provider during after hours 7P-7A, please log into the web site [email or web address] and access using universal [place] password for that web site. If you do not have the password, please call the hospital operator.  12/13/2021, 3:29 PM

## 2021-12-13 NOTE — Progress Notes (Signed)
Patient ambulated in the hallway with 4L oxygen. O2 SATs remained at lower 80s, highest reading was 85%. Patient had no signs of shortness of breath. Marcille Blanco, RN

## 2021-12-14 DIAGNOSIS — E876 Hypokalemia: Secondary | ICD-10-CM | POA: Diagnosis not present

## 2021-12-14 DIAGNOSIS — J9611 Chronic respiratory failure with hypoxia: Secondary | ICD-10-CM | POA: Diagnosis not present

## 2021-12-14 DIAGNOSIS — J441 Chronic obstructive pulmonary disease with (acute) exacerbation: Secondary | ICD-10-CM | POA: Diagnosis not present

## 2021-12-14 DIAGNOSIS — I10 Essential (primary) hypertension: Secondary | ICD-10-CM | POA: Diagnosis not present

## 2021-12-14 DIAGNOSIS — J9621 Acute and chronic respiratory failure with hypoxia: Secondary | ICD-10-CM | POA: Diagnosis not present

## 2021-12-14 LAB — BASIC METABOLIC PANEL
Anion gap: 8 (ref 5–15)
BUN: 13 mg/dL (ref 8–23)
CO2: 36 mmol/L — ABNORMAL HIGH (ref 22–32)
Calcium: 8.7 mg/dL — ABNORMAL LOW (ref 8.9–10.3)
Chloride: 95 mmol/L — ABNORMAL LOW (ref 98–111)
Creatinine, Ser: 0.53 mg/dL (ref 0.44–1.00)
GFR, Estimated: >60 mL/min (ref >60)
Glucose, Bld: 87 mg/dL (ref 70–99)
Potassium: 3.4 mmol/L — ABNORMAL LOW (ref 3.5–5.1)
Sodium: 139 mmol/L (ref 135–145)

## 2021-12-14 LAB — CBC
HCT: 45.1 % (ref 36.0–46.0)
Hemoglobin: 15.1 g/dL — ABNORMAL HIGH (ref 12.0–15.0)
MCH: 32.1 pg (ref 26.0–34.0)
MCHC: 33.5 g/dL (ref 30.0–36.0)
MCV: 95.8 fL (ref 80.0–100.0)
Platelets: 258 10*3/uL (ref 150–400)
RBC: 4.71 MIL/uL (ref 3.87–5.11)
RDW: 13.2 % (ref 11.5–15.5)
WBC: 10.2 10*3/uL (ref 4.0–10.5)
nRBC: 0 % (ref 0.0–0.2)

## 2021-12-14 MED ORDER — FUROSEMIDE 10 MG/ML IJ SOLN
20.0000 mg | Freq: Once | INTRAMUSCULAR | Status: AC
  Administered 2021-12-14: 20 mg via INTRAVENOUS
  Filled 2021-12-14: qty 2

## 2021-12-14 MED ORDER — POTASSIUM CHLORIDE CRYS ER 10 MEQ PO TBCR
40.0000 meq | EXTENDED_RELEASE_TABLET | ORAL | Status: AC
  Administered 2021-12-14 (×2): 40 meq via ORAL
  Filled 2021-12-14 (×2): qty 4

## 2021-12-14 NOTE — Progress Notes (Signed)
Patient ambulated from room to end of hallway and back with no difficulty. O2 at 5L, Patient O2 SAT before ambulation 90%. Patient O2SAT dropped to between 79% and 84% with ambulation. Patient was able to talk while ambulating, did not complain of shortness of breath. Marcille Blanco, RN

## 2021-12-14 NOTE — TOC Transition Note (Signed)
Transition of Care [facility]) - CM/SW Discharge Note   Patient Details  Name: Caroline Greer MRN: [id number] Date of Birth: 07/23/1955  [facility] CM/SW Contact:  Zenon Mayo, RN Phone Number: 12/14/2021, 12:02 PM   Clinical Narrative:    Patient for possible dc home today, she has home oxygen with [employer] 3 liters already, they will evaluate her at home for the portable oxygen.  She has no needs.         Patient Goals and CMS Choice        Discharge Placement                       Discharge Plan and Services                                     Social Determinants of Health (SDOH) Interventions     Readmission Risk Interventions     No data to display
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
## 2021-12-14 NOTE — Plan of Care (Signed)
  Problem: Education: Goal: Knowledge of disease or condition will improve Outcome: Progressing   Problem: Education: Goal: Knowledge of the prescribed therapeutic regimen will improve Outcome: Progressing   Problem: Activity: Goal: Ability to tolerate increased activity will improve Outcome: Progressing   Problem: Activity: Goal: Will verbalize the importance of balancing activity with adequate rest periods Outcome: Progressing   Problem: Respiratory: Goal: Ability to maintain a clear airway will improve Outcome: Progressing

## 2021-12-14 NOTE — Assessment & Plan Note (Signed)
-   Secondary to diuresis. -K-Dur 40 mEq p.o. every 4 hours x2 doses as patient received IV Lasix today.

## 2021-12-14 NOTE — Progress Notes (Signed)
Mobility Specialist - Progress Note   12/14/21 1200  Mobility  Activity Ambulated with assistance in hallway  Level of Assistance Independent  Assistive Device None  Distance Ambulated (ft) 300 ft  Activity Response Tolerated well  $Mobility charge 1 Mobility   Pt received in bed and agreeable to mobility. No c/o pain. Upon returning to room, O2 saturations was 84% but with pursed lip breathing was able to recover. Pt left at EOB with call bell in reach.    Post-mobility: 90% SPO2  Malachi Bonds

## 2021-12-15 ENCOUNTER — Inpatient Hospital Stay (HOSPITAL_COMMUNITY): Payer: Medicare Other

## 2021-12-15 ENCOUNTER — Other Ambulatory Visit (HOSPITAL_COMMUNITY): Payer: Self-pay

## 2021-12-15 DIAGNOSIS — I10 Essential (primary) hypertension: Secondary | ICD-10-CM | POA: Diagnosis not present

## 2021-12-15 DIAGNOSIS — E876 Hypokalemia: Secondary | ICD-10-CM

## 2021-12-15 DIAGNOSIS — R0609 Other forms of dyspnea: Secondary | ICD-10-CM | POA: Diagnosis not present

## 2021-12-15 DIAGNOSIS — J9621 Acute and chronic respiratory failure with hypoxia: Secondary | ICD-10-CM | POA: Diagnosis not present

## 2021-12-15 DIAGNOSIS — E440 Moderate protein-calorie malnutrition: Secondary | ICD-10-CM

## 2021-12-15 DIAGNOSIS — J441 Chronic obstructive pulmonary disease with (acute) exacerbation: Secondary | ICD-10-CM | POA: Diagnosis not present

## 2021-12-15 LAB — BASIC METABOLIC PANEL
Anion gap: 9 (ref 5–15)
BUN: 20 mg/dL (ref 8–23)
CO2: 34 mmol/L — ABNORMAL HIGH (ref 22–32)
Calcium: 9.1 mg/dL (ref 8.9–10.3)
Chloride: 96 mmol/L — ABNORMAL LOW (ref 98–111)
Creatinine, Ser: 0.54 mg/dL (ref 0.44–1.00)
GFR, Estimated: >60 mL/min (ref >60)
Glucose, Bld: 79 mg/dL (ref 70–99)
Potassium: 4.2 mmol/L (ref 3.5–5.1)
Sodium: 139 mmol/L (ref 135–145)

## 2021-12-15 LAB — CBC
HCT: 48.5 % — ABNORMAL HIGH (ref 36.0–46.0)
Hemoglobin: 15.8 g/dL — ABNORMAL HIGH (ref 12.0–15.0)
MCH: 31.8 pg (ref 26.0–34.0)
MCHC: 32.6 g/dL (ref 30.0–36.0)
MCV: 97.6 fL (ref 80.0–100.0)
Platelets: 253 10*3/uL (ref 150–400)
RBC: 4.97 MIL/uL (ref 3.87–5.11)
RDW: 13.2 % (ref 11.5–15.5)
WBC: 11.1 10*3/uL — ABNORMAL HIGH (ref 4.0–10.5)
nRBC: 0 % (ref 0.0–0.2)

## 2021-12-15 LAB — ECHOCARDIOGRAM COMPLETE
Area-P 1/2: 3.57 cm2
Height: 70.75 in
S' Lateral: 2.7 cm
Weight: 2147.2 oz

## 2021-12-15 LAB — MAGNESIUM: Magnesium: 2.2 mg/dL (ref 1.7–2.4)

## 2021-12-15 MED ORDER — GUAIFENESIN ER 600 MG PO TB12
1200.0000 mg | ORAL_TABLET | Freq: Two times a day (BID) | ORAL | 0 refills | Status: AC
  Filled 2021-12-15: qty 12, 3d supply, fill #0

## 2021-12-15 MED ORDER — FUROSEMIDE 40 MG PO TABS
40.0000 mg | ORAL_TABLET | Freq: Every day | ORAL | 0 refills | Status: DC | PRN
  Filled 2021-12-15: qty 20, 20d supply, fill #0

## 2021-12-15 MED ORDER — PANTOPRAZOLE SODIUM 40 MG PO TBEC
40.0000 mg | DELAYED_RELEASE_TABLET | Freq: Every day | ORAL | 1 refills | Status: DC
  Filled 2021-12-15: qty 30, 30d supply, fill #0

## 2021-12-15 MED ORDER — IPRATROPIUM-ALBUTEROL 0.5-2.5 (3) MG/3ML IN SOLN
3.0000 mL | Freq: Two times a day (BID) | RESPIRATORY_TRACT | 1 refills | Status: DC | PRN
  Filled 2021-12-15: qty 180, 15d supply, fill #0

## 2021-12-15 MED ORDER — LORATADINE 10 MG PO TABS
10.0000 mg | ORAL_TABLET | Freq: Every day | ORAL | 1 refills | Status: DC
  Filled 2021-12-15: qty 30, 30d supply, fill #0

## 2021-12-15 MED ORDER — PREDNISONE 20 MG PO TABS
ORAL_TABLET | ORAL | 0 refills | Status: AC
Start: 2021-12-16 — End: 2021-12-24
  Filled 2021-12-15: qty 15, 8d supply, fill #0

## 2021-12-15 NOTE — Progress Notes (Deleted)
Office Visit    Patient Name: Caroline Greer Date of Encounter: 12/15/2021  Primary Care Provider:  [name], MD Primary Cardiologist:  [name], MD Primary Electrophysiologist: None  Chief Complaint    Caroline Greer is a 66 y.o. female with PMH of CAD, COPD, HTN, breast CA currently undergoing XRT, chronic respiratory failure on 3 L nasal cannula at home presents today for post hospital follow-up.  Past Medical History    Past Medical History:  Diagnosis Date   Acute respiratory failure with hypoxia ([place]) 09/30/2018   Breast cancer ([place])    Constipation    COPD exacerbation ([place]) 01/05/2019   Depression with anxiety 09/30/2018   Dyspnea    Essential hypertension 09/30/2018   History of radiation therapy    Right breast- 09/01/21-10/16/21- Dr. Gery Pray   Hypertension    Tobacco dependence due to cigarettes 09/30/2018   Past Surgical History:  Procedure Laterality Date   BREAST LUMPECTOMY WITH RADIOACTIVE SEED AND SENTINEL LYMPH NODE BIOPSY Right 07/23/2021   Procedure: RIGHT BREAST LUMPECTOMY WITH RADIOACTIVE SEED AND SENTINEL LYMPH NODE BIOPSY;  Surgeon: Jovita Kussmaul, MD;  Location: [facility];  Service: General;  Laterality: Right;   HAND SURGERY Right 1997   Hand Fracture (right thumb & hand)   LAPAROTOMY  1974   RIGHT/LEFT HEART CATH AND CORONARY ANGIOGRAPHY N/A 10/02/2019   Procedure: RIGHT/LEFT HEART CATH AND CORONARY ANGIOGRAPHY;  Surgeon: Sherren Mocha, MD;  Location: [facility];  Service: Cardiovascular;  Laterality: N/A;    Allergies  Allergies  Allergen Reactions   Crab [Shellfish Allergy] Anaphylaxis and Swelling    Dungeness crab   Erythromycin Other (See Comments)    convulsion   Hydrocodone Nausea And Vomiting    Severe vomiting     History of Present Illness    Caroline Greer is a 66 year old female with the above-mentioned past medical history who presents today for post hospital follow-up of acute respiratory failure.  [name] was initially seen by Dr. Radford Pax in 07/2019 for evaluation of coronary artery calcification that was discovered on lung cancer CT screening.  CT revealed calcifications in the aorta and left main including 3 coronary arteries.  Patient was noted to have increased dyspnea on exertion with chest pressure that is relieved with rest.  Further evaluation was completed with coronary CTA with calcium score that revealed score of 1467 with multivessel CAD and recommendation for catheterization.  Patient underwent R/LHC that revealed mild to moderate nonobstructive CAD with 50% stenosis of mid RCA as well as 50% diagonal ostial stenosis.  Patient also had moderate pulmonary hypertension noted that was felt to be secondary to chronic lung disease.  She was treated with aggressive GDMT with ASA 81 mg, BB, ACE and statin.  Patient was admitted on 12/09/2021 with complaint of shortness of breath.  Patient reported shortness of breath for the last 3 weeks she is on chronic home O2 3 L.  She stated she went to [place] and while she was sitting outside waiting for her car was exposed to air.  Patient was hypoxic with sats in the 80s on arrival requiring nonrebreather at 100%.  2D echo was completed 12/15/2021 with EF of 60-65%, no RWMA, grade 1 DD, no MV or AV regurgitation and pulmonary hypertension noted.  She was treated with nebulizers and Solu-Medrol with IV Rocephin.  She also received IV Lasix due to significant dyspnea with exertion.  She achieved urine output of 3 L with improvement of  symptoms.  Since last being seen in the office patient reports***.  Patient denies chest pain, palpitations, dyspnea, PND, orthopnea, nausea, vomiting, dizziness, syncope, edema, weight gain, or early satiety.   ***Notes: -Patient discharged yesterday for acute COPD exacerbation -Patient on Norvasc and Lopressor for BP -Volumes and breathing since discharge -Mg was 2.2 and K was 4.2 Cr was 0.54 Home Medications    No current  facility-administered medications for this visit.   Current Outpatient Medications  Medication Sig Dispense Refill   furosemide (LASIX) 40 MG tablet Take 1 tablet (40 mg total) by mouth daily as needed. Take 1 tablet daily x 2 days, then as needed. 20 tablet 0   guaiFENesin (MUCINEX) 600 MG 12 hr tablet Take 2 tablets (1,200 mg total) by mouth 2 (two) times daily for 3 days. 12 tablet 0   ipratropium-albuterol (DUONEB) 0.5-2.5 (3) MG/3ML SOLN Use 1 vial via nebulizer 2 times daily for 3 days then 2 times daily as need.(shortness of breath) 360 mL 1   [START ON 12/16/2021] loratadine (CLARITIN) 10 MG tablet Take 1 tablet (10 mg total) by mouth daily. 30 tablet 1   [START ON 12/16/2021] pantoprazole (PROTONIX) 40 MG tablet Take 1 tablet (40 mg total) by mouth daily at 6 (six) AM. 30 tablet 1   [START ON 12/16/2021] predniSONE (DELTASONE) 20 MG tablet Take 3 tablets (60 mg total) by mouth daily before breakfast for 2 days, THEN 2 tablets (40 mg total) daily before breakfast for 3 days, THEN 1 tablet (20 mg total) daily before breakfast for 3 days. 15 tablet 0   Facility-Administered Medications Ordered in Other Visits  Medication Dose Route Frequency Provider Last Rate Last Admin   acetaminophen (TYLENOL) tablet 650 mg  650 mg Oral Q6H PRN Kristopher Oppenheim, DO       Or   acetaminophen (TYLENOL) suppository 650 mg  650 mg Rectal Q6H PRN Kristopher Oppenheim, DO       albuterol (PROVENTIL) (2.5 MG/3ML) 0.083% nebulizer solution 2.5 mg  2.5 mg Nebulization Q4H PRN Vann, Jessica U, DO       ALPRAZolam Duanne Moron) tablet 0.5 mg  0.5 mg Oral QHS PRN Eugenie Filler, MD   0.5 mg at 12/14/21 2132   amLODipine (NORVASC) tablet 10 mg  10 mg Oral Daily Kristopher Oppenheim, DO   10 mg at 12/15/21 0826   arformoterol (BROVANA) nebulizer solution 15 mcg  15 mcg Nebulization BID Eugenie Filler, MD   15 mcg at 12/15/21 5176   aspirin EC tablet 81 mg  81 mg Oral Daily Kristopher Oppenheim, DO   81 mg at 12/15/21 0825   atorvastatin (LIPITOR) tablet  10 mg  10 mg Oral Daily Kristopher Oppenheim, DO   10 mg at 12/15/21 0825   budesonide (PULMICORT) nebulizer solution 0.5 mg  0.5 mg Nebulization BID Eugenie Filler, MD   0.5 mg at 12/15/21 0841   enoxaparin (LOVENOX) injection 40 mg  40 mg Subcutaneous Q24H Eugenie Filler, MD   40 mg at 12/12/21 1638   feeding supplement (BOOST / RESOURCE BREEZE) liquid 1 Container  1 Container Oral TID BM Eulogio Bear U, DO   1 Container at 12/13/21 1050   fluticasone (FLONASE) 50 MCG/ACT nasal spray 2 spray  2 spray Each Nare Daily Eugenie Filler, MD   2 spray at 12/15/21 0827   guaiFENesin (MUCINEX) 12 hr tablet 1,200 mg  1,200 mg Oral BID Eugenie Filler, MD   1,200 mg at 12/15/21 [phone]  ipratropium-albuterol (DUONEB) 0.5-2.5 (3) MG/3ML nebulizer solution 3 mL  3 mL Nebulization BID Eugenie Filler, MD   3 mL at 12/15/21 0840   loratadine (CLARITIN) tablet 10 mg  10 mg Oral Daily Eugenie Filler, MD   10 mg at 12/15/21 0825   magnesium hydroxide (MILK OF MAGNESIA) suspension 15 mL  15 mL Oral Daily PRN Eulogio Bear U, DO   15 mL at 12/11/21 1524   metoprolol tartrate (LOPRESSOR) tablet 25 mg  25 mg Oral BID Kristopher Oppenheim, DO   25 mg at 12/15/21 0825   omega-3 acid ethyl esters (LOVAZA) capsule 1 g  1 g Oral Daily Eugenie Filler, MD   1 g at 12/15/21 0825   ondansetron (ZOFRAN) tablet 4 mg  4 mg Oral Q6H PRN Kristopher Oppenheim, DO       Or   ondansetron [facility]) injection 4 mg  4 mg Intravenous Q6H PRN Kristopher Oppenheim, DO       pantoprazole (PROTONIX) EC tablet 40 mg  40 mg Oral Q0600 Eugenie Filler, MD   40 mg at 12/15/21 0543   polyethylene glycol (MIRALAX / GLYCOLAX) packet 17 g  17 g Oral Daily PRN Eugenie Filler, MD   17 g at 12/11/21 2120   predniSONE (DELTASONE) tablet 60 mg  60 mg Oral QAC breakfast Eugenie Filler, MD   60 mg at 12/15/21 0543   vitamin B-12 (CYANOCOBALAMIN) tablet 500 mcg  500 mcg Oral Daily Eugenie Filler, MD   500 mcg at 12/15/21 3875     Review of Systems  Please  see the history of present illness.    (+)*** (+)***  All other systems reviewed and are otherwise negative except as noted above.  Physical Exam    Wt Readings from Last 3 Encounters:  12/15/21 134 lb 3.2 oz (60.9 kg)  11/30/21 131 lb (59.4 kg)  10/14/21 130 lb 0.6 oz (59 kg)   IE:PPIRJ were no vitals filed for this visit.,There is no height or weight on file to calculate BMI.  Constitutional:      Appearance: Healthy appearance. Not in distress.  Neck:     Vascular: JVD normal.  Pulmonary:     Effort: Pulmonary effort is normal.     Breath sounds: No wheezing. No rales. Diminished in the bases Cardiovascular:     Normal rate. Regular rhythm. Normal S1. Normal S2.      Murmurs: There is no murmur.  Edema:    Peripheral edema absent.  Abdominal:     Palpations: Abdomen is soft non tender. There is no hepatomegaly.  Skin:    General: Skin is warm and dry.  Neurological:     General: No focal deficit present.     Mental Status: Alert and oriented to person, place and time.     Cranial Nerves: Cranial nerves are intact.  EKG/LABS/Other Studies Reviewed    ECG personally reviewed by me today - ***  Risk Assessment/Calculations:   {Does this patient have ATRIAL FIBRILLATION?:[phone]}        Lab Results  Component Value Date   WBC 11.1 (H) 12/15/2021   HGB 15.8 (H) 12/15/2021   HCT 48.5 (H) 12/15/2021   MCV 97.6 12/15/2021   PLT 253 12/15/2021   Lab Results  Component Value Date   CREATININE 0.54 12/15/2021   BUN 20 12/15/2021   NA 139 12/15/2021   K 4.2 12/15/2021   CL 96 (L) 12/15/2021   CO2 34 (H)  12/15/2021   Lab Results  Component Value Date   ALT 15 12/09/2021   AST 16 12/09/2021   ALKPHOS 58 12/09/2021   BILITOT 0.3 12/09/2021   Lab Results  Component Value Date   CHOL 141 05/13/2020   HDL 84 05/13/2020   LDLCALC 40 05/13/2020   TRIG 92 05/13/2020   CHOLHDL 1.7 05/13/2020    No results found for: "HGBA1C"  Assessment & Plan    1.   Coronary artery disease: -Patient denies*** -Continue GDMT with ASA 81 mg and Lipitor 10 mg daily   2.  Hypertension: -Patient's blood pressure today was*** -Continue Lopressor 25 mg twice daily and Norvasc 10 mg daily  3.  Moderate pulmonary hypertension: -2D echo was completed 12/15/2021 with EF of 60-65%, no RWMA, grade 1 DD, no MV or AV regurgitation and pulmonary hypertension noted.  -Patient received IV Lasix with 3 L volume output -Today patient is*** -Continue as needed Lasix 40 mg daily for increased volume and shortness of breath   4.  COPD: -Followed by pulmonology  5.  Hyperlipidemia: -LDL cholesterol was 40 less than goal of 70 -Continue atorvastatin as noted above   6.  Tobacco abuse:       Disposition: Follow-up with [name], MD or APP in *** months {Are you ordering a CV Procedure (e.g. stress test, cath, DCCV, TEE, etc)?   Press F2        :[id number]}   Medication Adjustments/Labs and Tests Ordered: Current medicines are reviewed at length with the patient today.  Concerns regarding medicines are outlined above.   Signed, Mable Fill, Marissa Nestle, NP 12/15/2021, 4:21 PM [place]

## 2021-12-15 NOTE — Progress Notes (Signed)
Mobility Specialist - Progress Note    12/15/21 1105  Mobility  Activity Ambulated independently in hallway  Level of Assistance Independent  Assistive Device None  Distance Ambulated (ft) 550 ft  Activity Response Tolerated well  $Mobility charge 1 Mobility   Pt received in bed and agreeable to mobility. Pt returned to bed with call bell within reach and all needs met.   Paulla Dolly Mobility Specialist

## 2021-12-15 NOTE — Plan of Care (Signed)
?  Problem: Education: ?Goal: Knowledge of disease or condition will improve ?Outcome: Progressing ?  ?Problem: Education: ?Goal: Knowledge of the prescribed therapeutic regimen will improve ?Outcome: Progressing ?  ?Problem: Education: ?Goal: Individualized Educational Video(s) ?Outcome: Progressing ?  ?Problem: Activity: ?Goal: Ability to tolerate increased activity will improve ?Outcome: Progressing ?  ?Problem: Activity: ?Goal: Will verbalize the importance of balancing activity with adequate rest periods ?Outcome: Progressing ?  ?

## 2021-12-15 NOTE — Discharge Summary (Signed)
Physician Discharge Summary  Caroline Greer EXH:[id number] DOB: 1955/06/01 DOA: 12/09/2021  PCP: Caroline Panda, MD  Admit date: 12/09/2021 Discharge date: 12/15/2021  Time spent: 60 minutes  Recommendations for Outpatient Follow-up:  Follow-up with Caroline Panda, MD on 12/21/2021 at 11:15 AM.  On follow-up patient's respiratory status will need to be reassessed.  Patient will need a basic metabolic profile done to follow-up on electrolytes and renal function.  Patient also need a magnesium level checked. Follow-up with Dr. Valeta Greer, pulmonary on 01/07/2022 at 1:30 PM for follow-up on patient's COPD and hospital follow-up. Follow-up with Dr. Lindi Greer in 2 weeks or as scheduled.   Discharge Diagnoses:  Principal Problem:   COPD exacerbation ([place]) Active Problems:   Acute on chronic respiratory failure with hypoxia (HCC)   Chronic respiratory failure with hypoxia, on home O2 therapy (HCC) - 3 L/min   Essential hypertension   Malignant neoplasm of upper-outer quadrant of right breast in female, estrogen receptor positive (HCC)   Malnutrition of moderate degree   Hypokalemia   Discharge Condition: Stable and improved  Diet recommendation: Heart healthy  Filed Weights   12/13/21 0600 12/14/21 0539 12/15/21 0538  Weight: 63.3 kg 61.5 kg 60.9 kg    History of present illness:  HPI per Dr. [name] 66yo AAF with hx of breast cancer undergoing XRT, COPD, chronic respiratory failure on home O2 at 3 l/min presents to ER with worsening SOB for 3 days. pt states she went to see her radiologist about 2 weeks ago. she states she was "exposed to the smoke from [place]" while she was sitting outside waiting for her car at the [place] station. pt states she just completed a round of XRT for her breast cancer.   over last several days, she has had to increase her home O2 to 4L/min. no fevers. no chills. no productive cough.   on arrival to ER, pt hypoxic to 81% on 4 L/min. pt quickly escalated to 10 L/min and then  to 100% NRB. CTA negative for PE and pneumonia.   ER unable to wean pt back down to home O2 of 3 L/min.   TRH consulted for admission.    ED Course: CTA negative for pneumonia and PE  Hospital Course:   Assessment and Plan: * COPD exacerbation ([place]) -Patient presented in acute COPD exacerbation.   -Patient initially on presentation needed 100% nonrebreather, requiring 10 L/min nasal cannula.  -Patient was requiring 10 L/min nasal cannula with improvement with O2 requirements currently down to 3-4 L/min by day of discharge. -Patient initially received IV Solu-Medrol and subsequently placed on oral prednisone 40 mg daily initially.  -Due to increasing O2 requirements and some use of accessory muscles of respiration was placed back on IV Solu-Medrol, Pulmicort, Brovana, Flonase, Claritin, PPI, IV Rocephin.  Patient completed a 5-day course of antibiotic treatment. -Transitioned from IV Solu-Medrol to oral prednisone 60 mg daily with steroid taper which patient will be discharged on.  -Patient improved clinically, received few doses of IV Lasix with continued improvement and will be discharged on a steroid taper, DuoNebs scheduled x2 to 3 days, with close outpatient follow-up with primary pulmonologist.  -Patient was discharged in stable and improved condition.  COPD exacerbation ([place]) is a Acute on Chronic illness/condition that poses a threat to life or bodily function.   Acute on chronic respiratory failure with hypoxia (HCC) Acutely exacerbated on presentation -Baseline home O2 is 3-4 L/min -O2 requirements improved daily during the hospitalization with treatment for acute COPD exacerbation as  well as few doses of IV Lasix.  -Patient noted to desat with ambulation however denied any significant dyspnea with exertion during the hospitalization and towards day of discharge. -Patient given Lasix 20 mg IV x1 on 12/13/2021 with urine output of 3 L. -Patient subsequently given 2 more doses of  IV Lasix with good urine output and clinical improvement. -Transitioned from IV Solu-Medrol to oral prednisone 60 mg daily and taper which patient be discharged on. -Patient was maintained on Pulmicort, Brovana, Flonase, Claritin, PPI, IV Rocephin, scheduled DuoNebs. -Patient subsequently transition from IV Rocephin to oral Omnicef and completed a 5-day course of antibiotic treatment. -Patient noted still to have some hypoxia on ambulation on 12/12/2021 and as such received Lasix 20 g IV x1 with a urine output of 3 L.  -Patient still with some hypoxia with ambulation on 12/14/2021 however denied any significant dyspnea on exertion.  -Due to robust response with Lasix 20 mg IV x1 patient received 2 more doses of Lasix 20 mg IV every 12 hours with good urine output and clinical improvement.   -2D echo was obtained with a EF of 60 to 65%, NWMA, grade 1 diastolic dysfunction, no significant valvular abnormalities unchanged from prior 2D echo.   -Patient was discharged on a prednisone taper, scheduled nebs, Lasix 40 mg daily x2 to 3 days and then back to home regimen as needed. -Ambulatory sats were done and patient noted to sat of 90% on 4 L with ambulation and remained asymptomatic. -Case discussed with pulmonary. -Patient was discharged home in stable improved condition with close outpatient follow-up with primary pulmonologist.  Malignant neoplasm of upper-outer quadrant of right breast in female, estrogen receptor positive ([name]) Prior to admission noted to be receiving external beam radiation. -Oncology informed via epic of patient's admission.  -Outpatient follow-up with primary oncologist  Essential hypertension -Remained stable on home regimen of Norvasc, Lopressor.    Chronic respiratory failure with hypoxia, on home O2 therapy (HCC) - 3 L/min Acutely worsened however improved.   -Chronically on 3-4 L a minute at home.  Hypokalemia - Secondary to diuresis. -K-Dur 40 mEq p.o. every 4  hours x2 doses as patient received IV Lasix today.  Malnutrition of moderate degree - Patient maintained on nutritional supplementation.        Procedures: CT angiogram chest 12/09/2021 Chest x-ray 12/09/2021 2D echo 12/15/2021    Consultations: None  Discharge Exam: Vitals:   12/15/21 0538 12/15/21 1119  BP: 128/86 117/69  Pulse: 77 86  Resp:  17  Temp: 98 F (36.7 C) 98.8 F (37.1 C)  SpO2: 93% 90%    General: NAD Cardiovascular: Regular rate rhythm no murmurs rubs or gallops.  No JVD.  No lower extremity edema. Respiratory: Lungs clear to auscultation bilaterally.  No wheezes, no crackles, no rhonchi.  Fair air movement.  Speaking in full sentences.  Discharge Instructions   Discharge Instructions     Diet - low sodium heart healthy   Complete by: As directed    1514m/day fluid restriction   Increase activity slowly   Complete by: As directed       Allergies as of 12/15/2021       Reactions   Crab [shellfish Allergy] Anaphylaxis, Swelling   Dungeness crab   Erythromycin Other (See Comments)   convulsion   Hydrocodone Nausea And Vomiting   Severe vomiting         Medication List     STOP taking these medications    anastrozole 1  MG tablet Commonly known as: ARIMIDEX   doxycycline 100 MG capsule Commonly known as: VIBRAMYCIN       TAKE these medications    acetaminophen 500 MG tablet Commonly known as: TYLENOL Take 1,000 mg by mouth daily as needed (pain).   ALPRAZolam 0.5 MG tablet Commonly known as: XANAX Take 0.5 mg by mouth at bedtime as needed for sleep.   amLODipine 10 MG tablet Commonly known as: NORVASC Take 10 mg by mouth daily. What changed: Another medication with the same name was removed. Continue taking this medication, and follow the directions you see here.   aspirin EC 81 MG tablet Take 81 mg by mouth daily.   atorvastatin 10 MG tablet Commonly known as: LIPITOR Take 1 tablet (10 mg total) by mouth  daily. What changed: when to take this   Breztri Aerosphere 160-9-4.8 MCG/ACT Aero Generic drug: Budeson-Glycopyrrol-Formoterol Inhale 2 puffs into the lungs in the morning and at bedtime.   cyanocobalamin 500 MCG tablet Commonly known as: CYANOCOBALAMIN Take 500 mcg by mouth daily.   Fish Oil 1000 MG Caps Take 1,000 mg by mouth daily.   fluticasone 50 MCG/ACT nasal spray Commonly known as: FLONASE Place 2 sprays into both nostrils daily.   furosemide 40 MG tablet Commonly known as: LASIX Take 1 tablet (40 mg total) by mouth daily as needed. Take 1 tablet daily x 2 days, then as needed. What changed: additional instructions   guaiFENesin 600 MG 12 hr tablet Commonly known as: MUCINEX Take 2 tablets (1,200 mg total) by mouth 2 (two) times daily for 3 days.   ipratropium-albuterol 0.5-2.5 (3) MG/3ML Soln Commonly known as: DUONEB Use 1 vial via nebulizer 2 times daily for 3 days then 2 times daily as need.(shortness of breath)   loratadine 10 MG tablet Commonly known as: CLARITIN Take 1 tablet (10 mg total) by mouth daily. Start taking on: December 16, 2021   metoprolol tartrate 25 MG tablet Commonly known as: LOPRESSOR Take 1 tablet (25 mg total) by mouth 2 (two) times daily.   OXYGEN Inhale 3 L into the lungs continuous.   pantoprazole 40 MG tablet Commonly known as: PROTONIX Take 1 tablet (40 mg total) by mouth daily at 6 (six) AM. Start taking on: December 16, 2021   polyethylene glycol 17 g packet Commonly known as: MIRALAX / GLYCOLAX Take 17 g by mouth daily. What changed:  when to take this reasons to take this   predniSONE 20 MG tablet Commonly known as: DELTASONE Take 3 tablets (60 mg total) by mouth daily before breakfast for 2 days, THEN 2 tablets (40 mg total) daily before breakfast for 3 days, THEN 1 tablet (20 mg total) daily before breakfast for 3 days. Start taking on: December 16, 2021   ProAir HFA 108 (90 Base) MCG/ACT inhaler Generic drug:  albuterol Inhale 2 puffs into the lungs every 6 (six) hours as needed for wheezing or shortness of breath. 90 day supply   VITAMIN D3 PO Take 1 tablet by mouth daily.   Vitamin E Skin Oil Apply 1 Application topically daily.               Durable Medical Equipment  (From admission, onward)           Start     Ordered   12/15/21 1552  For home use only DME 3 n 1  Once        12/15/21 1551   12/15/21 1550  For home use only  DME Shower stool  Once        12/15/21 1549   12/11/21 1537  For home use only DME Other see comment  Once       Comments: OCD eval  Question:  Length of Need  Answer:  12 Months   12/11/21 1536           Allergies  Allergen Reactions   Crab [Shellfish Allergy] Anaphylaxis and Swelling    Dungeness crab   Erythromycin Other (See Comments)    convulsion   Hydrocodone Nausea And Vomiting    Severe vomiting     Follow-up Information     Caroline Panda, MD. Go on 12/21/2021.   Specialty: Internal Medicine Why: '@11'$ :15am Contact information: [address] [place] [address] [phone]         Sueanne Margarita, MD .   Specialty: Cardiology Why: GO: JULY 27 @ 10:15am Contact information: [address] [place] [address] [phone]         Garner Nash, DO. Go on 01/07/2022.   Specialty: Pulmonary Disease Why: '@1'$ :30pm please arrive '@1'$ :15pm Contact information: [address] [place] [address] [phone]         Nicholas Lose, MD. Schedule an appointment as soon as possible for a visit in 2 week(s).   Specialty: Hematology and Oncology Why: IR:JJOA 31 @ 9:15am per [facility] information: [place] [address] [phone]         [facility] Follow up.   Why: 3 n 1, shower stool, will be delivered to the home Contact information: [address] [place] [address] [phone]                  The results of  significant diagnostics from this hospitalization (including imaging, microbiology, ancillary and laboratory) are listed below for reference.    Significant Diagnostic Studies: ECHOCARDIOGRAM COMPLETE  Result Date: 12/15/2021    ECHOCARDIOGRAM REPORT   Patient Name:   Caroline Greer Date of Exam: 12/15/2021 Medical Rec #:  [id number]     Height:       70.7 in Accession #:    [id number]    Weight:       134.2 lb Date of Birth:  09-Oct-1955     BSA:          1.775 m Patient Age:    29 years      BP:           128/86 mmHg Patient Gender: F             HR:           86 bpm. Exam Location:  Inpatient Procedure: 2D Echo, Cardiac Doppler and Color Doppler Indications:    Dyspnea R06.00  History:        Patient has prior history of Echocardiogram examinations, most                 recent 11/14/2020. COPD; Risk Factors:Hypertension and Former                 Smoker. History of breast cancer. Acute on chronic respiratory                 failure with hypoxia.  Sonographer:    Darlina Sicilian RDCS Referring Phys: [place]  1. Left ventricular ejection fraction, by estimation, is 60 to 65%. The left ventricle has normal function.  The left ventricle has no regional wall motion abnormalities. There is mild left ventricular hypertrophy. Left ventricular diastolic parameters are consistent with Grade I diastolic dysfunction (impaired relaxation).  2. Right ventricular systolic function is normal. The right ventricular size is mildly enlarged.  3. No evidence of mitral valve regurgitation.  4. Aortic valve regurgitation is not visualized. Aortic valve sclerosis/calcification is present, without any evidence of aortic stenosis. Comparison(s): No significant change from prior study. FINDINGS  Left Ventricle: Left ventricular ejection fraction, by estimation, is 60 to 65%. The left ventricle has normal function. The left ventricle has no regional wall motion abnormalities. The left ventricular internal cavity  size was normal in size. There is  mild left ventricular hypertrophy. Left ventricular diastolic parameters are consistent with Grade I diastolic dysfunction (impaired relaxation). Right Ventricle: The right ventricular size is mildly enlarged. Right ventricular systolic function is normal. Left Atrium: Left atrial size was normal in size. Right Atrium: Right atrial size was normal in size. Pericardium: Trivial pericardial effusion is present. Mitral Valve: Mild mitral annular calcification. No evidence of mitral valve regurgitation. Tricuspid Valve: The tricuspid valve is normal in structure. Tricuspid valve regurgitation is not demonstrated. Aortic Valve: Aortic valve regurgitation is not visualized. Aortic valve sclerosis/calcification is present, without any evidence of aortic stenosis. Pulmonic Valve: The pulmonic valve was not well visualized. Pulmonic valve regurgitation is not visualized. Aorta: The aortic root is normal in size and structure.  LEFT VENTRICLE PLAX 2D LVIDd:         3.90 cm   Diastology LVIDs:         2.70 cm   LV e' medial:    4.47 cm/s LV PW:         1.00 cm   LV E/e' medial:  15.0 LV IVS:        1.20 cm   LV e' lateral:   6.94 cm/s LVOT diam:     2.30 cm   LV E/e' lateral: 9.7 LV SV:         78 LV SV Index:   44 LVOT Area:     4.15 cm  RIGHT VENTRICLE RV Basal diam:  4.60 cm RV Mid diam:    2.80 cm RV S prime:     17.50 cm/s TAPSE (M-mode): 2.0 cm LEFT ATRIUM             Index        RIGHT ATRIUM           Index LA diam:        3.20 cm 1.80 cm/m   RA Area:     14.50 cm LA Vol (A2C):   35.6 ml 20.05 ml/m  RA Volume:   35.80 ml  20.16 ml/m LA Vol (A4C):   36.3 ml 20.45 ml/m LA Biplane Vol: 38.2 ml 21.52 ml/m  AORTIC VALVE LVOT Vmax:   90.80 cm/s LVOT Vmean:  69.600 cm/s LVOT VTI:    0.188 m  AORTA Ao Root diam: 3.00 cm MITRAL VALVE MV Area (PHT): 3.57 cm    SHUNTS MV Decel Time: 213 msec    Systemic VTI:  0.19 m MV E velocity: 67.05 cm/s  Systemic Diam: 2.30 cm MV A velocity: 71.10  cm/s MV E/A ratio:  0.94 Mary Scientist, physiological signed by Phineas Inches Signature Date/Time: 12/15/2021/11:33:48 AM    Final    CT Angio Chest PE W and/or Wo Contrast  Result Date: 12/09/2021 CLINICAL DATA:  Pulmonary embolism suspected, positive D-dimer. Shortness  of breath for 3 weeks. EXAM: CT ANGIOGRAPHY CHEST WITH CONTRAST TECHNIQUE: Multidetector CT imaging of the chest was performed using the standard protocol during bolus administration of intravenous contrast. Multiplanar CT image reconstructions and MIPs were obtained to evaluate the vascular anatomy. RADIATION DOSE REDUCTION: This exam was performed according to the departmental dose-optimization program which includes automated exposure control, adjustment of the mA and/or kV according to patient size and/or use of iterative reconstruction technique. CONTRAST:  81m OMNIPAQUE IOHEXOL 350 MG/ML SOLN COMPARISON:  03/10/2020. FINDINGS: Cardiovascular: The heart is mildly enlarged and there is no pericardial effusion. Multi-vessel coronary artery calcifications are noted. There is atherosclerotic calcification of the aorta without evidence of aneurysm. The pulmonary trunk is normal in caliber. No pulmonary artery filling defect is identified. Mediastinum/Nodes: Enlarged lymph nodes are present in the mediastinum measuring up to 1.2 cm in the precarinal space. There are prominent hilar lymph nodes. No axillary lymphadenopathy. The thyroid gland, trachea, and esophagus are within normal limits. Lungs/Pleura: Emphysematous changes are noted in the lungs bilaterally. There are small bilateral pleural effusions with atelectasis. Focal atelectasis is noted in the lingular segment of the left upper lobe. There is a nodule in the posterior segment of the right upper lobe measuring 5 mm, axial image 50. No pneumothorax. Upper Abdomen: No acute abnormality. Musculoskeletal: Surgical clips are noted in the right breast. Old healed rib fractures are noted on the  right. No acute or suspicious osseous abnormality Review of the MIP images confirms the above findings. IMPRESSION: 1. No evidence of pulmonary embolism. 2. Small bilateral pleural effusions with atelectasis. 3. 5 mm right upper lobe pulmonary nodule, unchanged from 2021 and likely benign. 4. Emphysema. 5. Aortic atherosclerosis and coronary artery calcifications. Electronically Signed   By: LBrett FairyM.D.   On: 12/09/2021 21:57   DG Chest Portable 1 View  Result Date: 12/09/2021 CLINICAL DATA:  Shortness of breath. EXAM: PORTABLE CHEST 1 VIEW COMPARISON:  January 05, 2019 FINDINGS: Calcific atherosclerotic disease of the aorta. Cardiomediastinal silhouette is normal. Mediastinal contours appear intact. Small left pleural effusion. Left lower lobe atelectasis versus airspace consolidation. Osseous structures are without acute abnormality. Soft tissues are grossly normal. IMPRESSION: Small left pleural effusion with left lower lobe atelectasis versus airspace consolidation. Electronically Signed   By: DFidela SalisburyM.D.   On: 12/09/2021 19:15    Microbiology: Recent Results (from the past 240 hour(s))  Resp Panel by RT-PCR (Flu A&B, Covid) Anterior Nasal Swab     Status: None   Collection Time: 12/09/21  7:00 PM   Specimen: Anterior Nasal Swab  Result Value Ref Range Status   SARS Coronavirus 2 by RT PCR NEGATIVE NEGATIVE Final    Comment: (NOTE) SARS-CoV-2 target nucleic acids are NOT DETECTED.  The SARS-CoV-2 RNA is generally detectable in upper respiratory specimens during the acute phase of infection. The lowest concentration of SARS-CoV-2 viral copies this assay can detect is 138 copies/mL. A negative result does not preclude SARS-Cov-2 infection and should not be used as the sole basis for treatment or other patient management decisions. A negative result may occur with  improper specimen collection/handling, submission of specimen other than nasopharyngeal swab, presence of  viral mutation(s) within the areas targeted by this assay, and inadequate number of viral copies(<138 copies/mL). A negative result must be combined with clinical observations, patient history, and epidemiological information. The expected result is Negative.  Fact Sheet for Patients:  hEntrepreneurPulse.com.au Fact Sheet for Healthcare Providers:  hIncredibleEmployment.be This test  is no t yet approved or cleared by the [place] and  has been authorized for detection and/or diagnosis of SARS-CoV-2 by FDA under an Emergency Use Authorization (EUA). This EUA will remain  in effect (meaning this test can be used) for the duration of the COVID-19 declaration under Section 564(b)(1) of the Act, 21 U.S.C.section 360bbb-3(b)(1), unless the authorization is terminated  or revoked sooner.       Influenza A by PCR NEGATIVE NEGATIVE Final   Influenza B by PCR NEGATIVE NEGATIVE Final    Comment: (NOTE) The Xpert Xpress SARS-CoV-2/FLU/RSV plus assay is intended as an aid in the diagnosis of influenza from Nasopharyngeal swab specimens and should not be used as a sole basis for treatment. Nasal washings and aspirates are unacceptable for Xpert Xpress SARS-CoV-2/FLU/RSV testing.  Fact Sheet for Patients: EntrepreneurPulse.com.au  Fact Sheet for Healthcare Providers: IncredibleEmployment.be  This test is not yet approved or cleared by the [place] FDA and has been authorized for detection and/or diagnosis of SARS-CoV-2 by FDA under an Emergency Use Authorization (EUA). This EUA will remain in effect (meaning this test can be used) for the duration of the COVID-19 declaration under Section 564(b)(1) of the Act, 21 U.S.C. section 360bbb-3(b)(1), unless the authorization is terminated or revoked.  Performed at [facility], [place] [address], [place], [place] [address]   Respiratory (~20 pathogens)  panel by PCR     Status: None   Collection Time: 12/11/21 10:18 AM   Specimen: Nasopharyngeal Swab; Respiratory  Result Value Ref Range Status   Adenovirus NOT DETECTED NOT DETECTED Final   Coronavirus 229E NOT DETECTED NOT DETECTED Final    Comment: (NOTE) The Coronavirus on the Respiratory Panel, DOES NOT test for the novel  Coronavirus (2019 nCoV)    Coronavirus HKU1 NOT DETECTED NOT DETECTED Final   Coronavirus NL63 NOT DETECTED NOT DETECTED Final   Coronavirus OC43 NOT DETECTED NOT DETECTED Final   Metapneumovirus NOT DETECTED NOT DETECTED Final   Rhinovirus / Enterovirus NOT DETECTED NOT DETECTED Final   Influenza A NOT DETECTED NOT DETECTED Final   Influenza B NOT DETECTED NOT DETECTED Final   Parainfluenza Virus 1 NOT DETECTED NOT DETECTED Final   Parainfluenza Virus 2 NOT DETECTED NOT DETECTED Final   Parainfluenza Virus 3 NOT DETECTED NOT DETECTED Final   Parainfluenza Virus 4 NOT DETECTED NOT DETECTED Final   Respiratory Syncytial Virus NOT DETECTED NOT DETECTED Final   Bordetella pertussis NOT DETECTED NOT DETECTED Final   Bordetella Parapertussis NOT DETECTED NOT DETECTED Final   Chlamydophila pneumoniae NOT DETECTED NOT DETECTED Final   Mycoplasma pneumoniae NOT DETECTED NOT DETECTED Final    Comment: Performed at [facility], [place]. [address] [place], [place] [address]     Labs: Basic Metabolic Panel: Recent Labs  Lab 12/10/21 1604 12/11/21 0319 12/12/21 0304 12/13/21 0446 12/14/21 0607 12/15/21 0153  NA 138 140 135 139 139 139  K 4.0 4.1 4.2 4.3 3.4* 4.2  CL 97* 101 97* 99 95* 96*  CO2 30 32 31 33* 36* 34*  GLUCOSE 203* 135* 138* 85 87 79  BUN '14 12 16 16 13 20  '$ CREATININE 0.64 0.49 0.62 0.50 0.53 0.54  CALCIUM 9.1 9.2 8.6* 8.7* 8.7* 9.1  MG 2.1 2.0  --   --   --  2.2   Liver Function Tests: Recent Labs  Lab 12/09/21 1900  AST 16  ALT 15  ALKPHOS 58  BILITOT 0.3  PROT 7.5  ALBUMIN 3.7   No results for input(s): "LIPASE", "AMYLASE" in  the last 168 hours. No results for input(s): "AMMONIA" in the last 168 hours. CBC: Recent Labs  Lab 12/09/21 1900 12/10/21 0814 12/11/21 0319 12/12/21 0304 12/13/21 0446 12/14/21 0607 12/15/21 0153  WBC 8.7 10.2 8.9 14.5* 13.2* 10.2 11.1*  NEUTROABS 6.8 9.9* 8.4*  --  11.1*  --   --   HGB 16.8* 15.0 14.6 14.2 14.6 15.1* 15.8*  HCT 51.5* 46.7* 44.8 44.1 45.3 45.1 48.5*  MCV 99.2 100.0 97.8 99.3 98.1 95.8 97.6  PLT 252 239 222 260 251 258 253   Cardiac Enzymes: No results for input(s): "CKTOTAL", "CKMB", "CKMBINDEX", "TROPONINI" in the last 168 hours. BNP: BNP (last 3 results) No results for input(s): "BNP" in the last 8760 hours.  ProBNP (last 3 results) No results for input(s): "PROBNP" in the last 8760 hours.  CBG: No results for input(s): "GLUCAP" in the last 168 hours.     Signed:  Irine Seal MD.  [facility] 12/15/2021, 4:18 PM

## 2021-12-15 NOTE — Progress Notes (Signed)
  Echocardiogram 2D Echocardiogram has been performed.  Darlina Sicilian M 12/15/2021, 10:30 AM

## 2021-12-15 NOTE — Assessment & Plan Note (Signed)
-   Patient maintained on nutritional supplementation.

## 2021-12-15 NOTE — Progress Notes (Signed)
SATURATION QUALIFICATIONS: (This note is used to comply with regulatory documentation for home oxygen)  Patient Saturations on Room Air at Rest = 83%  Patient Saturations on Room Air while Ambulating = n/a  Patient Saturations on 4 Liters of oxygen while Ambulating = 90%  Please briefly explain why patient needs home oxygen:

## 2021-12-15 NOTE — TOC Transition Note (Signed)
Transition of Care [facility]) - CM/SW Discharge Note   Patient Details  Name: Caroline Greer MRN: [id number] Date of Birth: July 17, 1955  Transition of Care [facility]) CM/SW Contact:  Zenon Mayo, RN Phone Number: 12/15/2021, 4:04 PM   Clinical Narrative:    Patient is for dc today , her brother will be transporting her home, she has home oxygen with [employer] they will deliver a tank to her room for her to go home with.  The 3 n 1 and shower stool will be delivered to her home by [employer]. She will also be evaluated for portable oxygen at home.    Final next level of care: Home/Self Care Barriers to Discharge: No Barriers Identified   Patient Goals and CMS Choice Patient states their goals for this hospitalization and ongoing recovery are:: return home   Choice offered to / list presented to : NA  Discharge Placement                       Discharge Plan and Services                DME Arranged: Shower stool, 3-N-1 DME Agency: [employer] Date DME Agency Contacted: 12/15/21 Time DME Agency Contacted: [id number] Representative spoke with at DME Agency: Jodell Cipro HH Arranged: NA          Social Determinants of Health ([place]) Interventions     Readmission Risk Interventions     No data to display

## 2021-12-17 ENCOUNTER — Ambulatory Visit: Payer: Medicaid Other | Admitting: Nurse Practitioner

## 2021-12-17 NOTE — Progress Notes (Signed)
Patient Care Team: [name], MD as PCP - General (Internal Medicine) Sueanne Margarita, MD as PCP - Cardiology (Cardiology) Mauro Kaufmann, RN as Oncology Nurse Navigator Rockwell Germany, RN as Oncology Nurse Navigator Jovita Kussmaul, MD as Consulting Physician (General Surgery) Nicholas Lose, MD as Consulting Physician (Hematology and Oncology) Gery Pray, MD as Consulting Physician (Radiation Oncology)  DIAGNOSIS: No diagnosis found.  SUMMARY OF ONCOLOGIC HISTORY: Oncology History  Malignant neoplasm of upper-outer quadrant of right breast in female, estrogen receptor positive ([place])  05/20/2021 Initial Diagnosis   Palpable mass in the right breast upper outer quadrant: 1.1 cm along with distortion.  Mammogram ultrasound revealed 1.2 cm mass at 11 o'clock position, axilla negative, biopsy revealed grade 2 invasive lobular cancer ER 90%, PR 100%, HER2 negative, Ki-67 5%   06/03/2021 Cancer Staging   Staging form: Breast, AJCC 8th Edition - Clinical stage from 06/03/2021: Stage IA (cT1b, cN0, cM0, G2, ER+, PR+, HER2-) - Signed by Nicholas Lose, MD on 06/03/2021 Stage prefix: Initial diagnosis Histologic grading system: 3 grade system   07/23/2021 Surgery   Right lumpectomy: Grade 2 invasive lobular cancer, LCIS, margins negative, 1/3 lymph nodes positive, ER 90%, PR 100%, HER2 negative, Ki-67 5%   08/03/2021 Cancer Staging   Staging form: Breast, AJCC 8th Edition - Pathologic: Stage IA (pT1c, pN1, cM0, G2, ER+, PR+, HER2-) - Signed by Nicholas Lose, MD on 08/03/2021 Stage prefix: Initial diagnosis Histologic grading system: 3 grade system   09/02/2021 - 10/16/2021 Radiation Therapy   Adjuvant radiation     CHIEF COMPLIANT: Hospital follow-up  INTERVAL HISTORY: Caroline Greer is a  66 y.o. female is here because of recent diagnosis of right breast cancer. She presents to the clinic for hospital follow-up.   ALLERGIES:  is allergic to crab [shellfish allergy], erythromycin,  and hydrocodone.  MEDICATIONS:  Current Outpatient Medications  Medication Sig Dispense Refill   acetaminophen (TYLENOL) 500 MG tablet Take 1,000 mg by mouth daily as needed (pain).     ALPRAZolam (XANAX) 0.5 MG tablet Take 0.5 mg by mouth at bedtime as needed for sleep.     amLODipine (NORVASC) 10 MG tablet Take 10 mg by mouth daily.     aspirin EC 81 MG tablet Take 81 mg by mouth daily.     atorvastatin (LIPITOR) 10 MG tablet Take 1 tablet (10 mg total) by mouth daily. (Patient taking differently: Take 10 mg by mouth at bedtime.) 90 tablet 3   Budeson-Glycopyrrol-Formoterol (BREZTRI AEROSPHERE) 160-9-4.8 MCG/ACT AERO Inhale 2 puffs into the lungs in the morning and at bedtime. 32.1 g 1   Cholecalciferol (VITAMIN D3 PO) Take 1 tablet by mouth daily.     fluticasone (FLONASE) 50 MCG/ACT nasal spray Place 2 sprays into both nostrils daily.     furosemide (LASIX) 40 MG tablet Take 1 tablet (40 mg total) by mouth daily as needed. Take 1 tablet daily x 2 days, then as needed. 20 tablet 0   guaiFENesin (MUCINEX) 600 MG 12 hr tablet Take 2 tablets (1,200 mg total) by mouth 2 (two) times daily for 3 days. 12 tablet 0   ipratropium-albuterol (DUONEB) 0.5-2.5 (3) MG/3ML SOLN Use 1 vial via nebulizer 2 times daily for 3 days then 2 times daily as need.(shortness of breath) 360 mL 1   loratadine (CLARITIN) 10 MG tablet Take 1 tablet (10 mg total) by mouth daily. 30 tablet 1   metoprolol tartrate (LOPRESSOR) 25 MG tablet Take 1 tablet (25 mg total) by  mouth 2 (two) times daily. 180 tablet 3   Omega-3 Fatty Acids (FISH OIL) 1000 MG CAPS Take 1,000 mg by mouth daily. (Patient not taking: Reported on 12/10/2021)     OXYGEN Inhale 3 L into the lungs continuous.     pantoprazole (PROTONIX) 40 MG tablet Take 1 tablet (40 mg total) by mouth daily at 6 (six) AM. 30 tablet 1   polyethylene glycol (MIRALAX / GLYCOLAX) 17 g packet Take 17 g by mouth daily. (Patient taking differently: Take 17 g by mouth daily as needed  for mild constipation or moderate constipation.) 14 each 0   predniSONE (DELTASONE) 20 MG tablet Take 3 tablets (60 mg total) by mouth daily before breakfast for 2 days, THEN 2 tablets (40 mg total) daily before breakfast for 3 days, THEN 1 tablet (20 mg total) daily before breakfast for 3 days. 15 tablet 0   PROAIR HFA 108 (90 Base) MCG/ACT inhaler Inhale 2 puffs into the lungs every 6 (six) hours as needed for wheezing or shortness of breath. 90 day supply 18 g 3   vitamin B-12 (CYANOCOBALAMIN) 500 MCG tablet Take 500 mcg by mouth daily.     Vitamin E Skin OIL Apply 1 Application topically daily.     No current facility-administered medications for this visit.    PHYSICAL EXAMINATION: ECOG PERFORMANCE STATUS: {CHL ONC ECOG PS:[id number]}  There were no vitals filed for this visit. There were no vitals filed for this visit.  BREAST:*** No palpable masses or nodules in either right or left breasts. No palpable axillary supraclavicular or infraclavicular adenopathy no breast tenderness or nipple discharge. (exam performed in the presence of a chaperone)  LABORATORY DATA:  I have reviewed the data as listed    Latest Ref Rng & Units 12/15/2021    1:53 AM 12/14/2021    6:07 AM 12/13/2021    4:46 AM  CMP  Glucose 70 - 99 mg/dL 79  87  85   BUN 8 - 23 mg/dL 20  13  16    Creatinine 0.44 - 1.00 mg/dL 0.54  0.53  0.50   Sodium 135 - 145 mmol/L 139  139  139   Potassium 3.5 - 5.1 mmol/L 4.2  3.4  4.3   Chloride 98 - 111 mmol/L 96  95  99   CO2 22 - 32 mmol/L 34  36  33   Calcium 8.9 - 10.3 mg/dL 9.1  8.7  8.7     Lab Results  Component Value Date   WBC 11.1 (H) 12/15/2021   HGB 15.8 (H) 12/15/2021   HCT 48.5 (H) 12/15/2021   MCV 97.6 12/15/2021   PLT 253 12/15/2021   NEUTROABS 11.1 (H) 12/13/2021    ASSESSMENT & PLAN:  No problem-specific Assessment & Plan notes found for this encounter.    No orders of the defined types were placed in this encounter.  The patient has a good  understanding of the overall plan. she agrees with it. she will call with any problems that may develop before the next visit here. Total time spent: 30 mins including face to face time and time spent for planning, charting and co-ordination of care   Suzzette Righter, [place] 12/17/21    I Gardiner Coins am scribing for Dr. [name]  ***

## 2021-12-21 ENCOUNTER — Other Ambulatory Visit: Payer: Self-pay

## 2021-12-21 ENCOUNTER — Inpatient Hospital Stay: Payer: Medicare Other | Attending: Hematology and Oncology | Admitting: Hematology and Oncology

## 2021-12-21 DIAGNOSIS — J441 Chronic obstructive pulmonary disease with (acute) exacerbation: Secondary | ICD-10-CM | POA: Insufficient documentation

## 2021-12-21 DIAGNOSIS — C50411 Malignant neoplasm of upper-outer quadrant of right female breast: Secondary | ICD-10-CM | POA: Diagnosis present

## 2021-12-21 DIAGNOSIS — Z170 Estrogen receptor positive status [ER+]: Secondary | ICD-10-CM | POA: Diagnosis not present

## 2021-12-21 MED ORDER — EXEMESTANE 25 MG PO TABS: 12.5000 mg | ORAL_TABLET | Freq: Every day | ORAL | 3 refills | Status: DC

## 2021-12-21 NOTE — Assessment & Plan Note (Addendum)
07/23/2021:Right lumpectomy: Grade 2 invasive lobular cancer, LCIS, margins negative, 1/3 lymph nodes positive, ER 90%, PR 100%, HER2 negative, Ki-67 5%  Treatment plan: 1.Molecular testing will not be done because of severe COPD and performance status issues 2.Adjuvant radiation therapy 09/02/2021-10/16/2021 3.Adjuvant antiestrogen therapy to started 10/22/2021 switched to anastrozole.  Could not tolerate that either  Letrozole and anastrozole toxicities: Diffuse muscle aches and pains with stiffness of her hands and feet  We discussed the role of exemestane and breast cancer prevention.  If she cannot tolerate it then I do not intend to give her tamoxifen because of her high risk of blood clots.  Hospitalization: 12/09/2021-12/15/2021: COPD exacerbation and respiratory failure.  She tells me that she had a profound diuresis of 5 pounds of weight in the first day of hospitalization and since then she has been feeling much better.  Return to clinic at the end of August for survivorship care plan visit

## 2022-01-07 ENCOUNTER — Ambulatory Visit (INDEPENDENT_AMBULATORY_CARE_PROVIDER_SITE_OTHER): Payer: Medicare Other | Admitting: Pulmonary Disease

## 2022-01-07 ENCOUNTER — Other Ambulatory Visit: Payer: Self-pay

## 2022-01-07 ENCOUNTER — Encounter: Payer: Self-pay | Admitting: Pulmonary Disease

## 2022-01-07 VITALS — BP 102/52 | HR 90 | Ht 70.75 in | Wt 143.0 lb

## 2022-01-07 DIAGNOSIS — J449 Chronic obstructive pulmonary disease, unspecified: Secondary | ICD-10-CM

## 2022-01-07 DIAGNOSIS — Z87891 Personal history of nicotine dependence: Secondary | ICD-10-CM

## 2022-01-07 DIAGNOSIS — J9611 Chronic respiratory failure with hypoxia: Secondary | ICD-10-CM | POA: Diagnosis not present

## 2022-01-07 DIAGNOSIS — J432 Centrilobular emphysema: Secondary | ICD-10-CM

## 2022-01-07 DIAGNOSIS — R918 Other nonspecific abnormal finding of lung field: Secondary | ICD-10-CM | POA: Diagnosis not present

## 2022-01-07 DIAGNOSIS — F17210 Nicotine dependence, cigarettes, uncomplicated: Secondary | ICD-10-CM

## 2022-01-07 DIAGNOSIS — Z122 Encounter for screening for malignant neoplasm of respiratory organs: Secondary | ICD-10-CM

## 2022-01-07 NOTE — Patient Instructions (Addendum)
Thank you for visiting Dr. [name] at [facility]. Today we recommend the following:  Continue breztri morning and night   Return in about 6 months (around 07/10/2022).    Please do your part to reduce the spread of COVID-19.

## 2022-01-07 NOTE — Progress Notes (Signed)
Synopsis: Referred in September 2020 for shortness of breath by Caroline Panda, MD  Subjective:   PATIENT ID: Caroline Greer GENDER: female DOB: [birth year], MRN: [id number]  Chief Complaint  Patient presents with   Follow-up    Follow-up: Hosp. visit    Patient with a past medical history of hypertension, problem list includes COPD.  No prior PFTs in epic.  Patient was admitted to the hospital on 01/05/2019 for COPD exacerbation.  Patient was discharged from the hospital on 01/07/2019.  Recommending outpatient pulmonary follow-up.  Admitted to the hospital on 814 with medical history of hypertension depression tobacco abuse and COPD worsening dyspnea over the past 3 days.  Started after she was mowing her grass.  Included symptoms of wheezing coughing no chest pain and no fevers.  COVID negative.  Was found to be hypoxemic oxygen saturations in the 90s on 6 L nasal cannula tachycardic.  EKG with right axis deviation no ST changes negative T waves in V1 through 4.  Patient was treated with bronchodilators systemic steroids and discharged on 3 L nasal cannula.  Patient was encouraged to use quit smoking.  Today, overall in office patient has been doing well since her hospitalization.  She has been slowly increasing her exercise tolerance.  She has been mowing her grass.  She currently lives alone.  She enjoys fishing.  She plans on going this weekend.  Unfortunately she is still smoking.  She is been trying to cut down but finds that it is very difficult.  She has been working with her insurance to help cover cost of inhalers however most inhalers have been very expensive.  She has been relying on receiving samples from her PCP.  We will also help her receive samples.  If needed we may be able to apply for financial assistance through manufacturer.  She does have daily dyspnea on exertion cough and shortness of breath.  She uses her nebulizer regularly at home.  Denies weight loss fevers or  hemoptysis.  OV 03/07/2019: Patient here today for COPD follow-up.  At last office visit patient was started on Trelegy inhaler.  Patient has been doing very well on Trelegy inhaler after start of the medication last office visit.  She needs refills of this medication today would like to have samples.  Also given refill of her albuterol inhaler.  Today we discussed her CT scan results which revealed multiple bilateral pulmonary nodules all of which were stable and benign in comparison to previous.  She does have evidence of emphysema.  Otherwise her dyspnea on exertion is doing well.  Delay in scheduling of her PFTs due to [place].  I think we can push this off until her repeat follow-up with Caroline Greer in 6 months.  Otherwise patient denies dyspnea on exertion nausea vomiting diarrhea chest pain.  No hemoptysis.  OV 07/18/2019: Patient seen today for follow-up of COPD.  Also has pulmonary nodules.  Patient last seen in the office by Caroline Barrow, NP.  Was unable to get PFTs completed due to rescheduling due to Covid.  At the time had side effects from Trelegy.  This was stopped trialed off of inhaled steroids started on Stiolto.  Unfortunately at this time was still smoking.  Patient was also referred to lung cancer screening program and offered smoking cessation counseling.  Currently managed with anoro.  She feels like she was breathing much better on the Trelegy.  She comes today hypoxemic in the room sats in the 70s.  She did  not bring her home oxygen with her into the building.  We put her on oxygen here today she feels breathing much better at this time.  Of note she was able to quit smoking.  She did meet with pharmacy x2 since last office visit.  She does describe ice eating today and urged to eat ice.  She has not had a colonoscopy before.  She denies dark stools.  OV 07/02/2021: Here today for follow-up regarding COPD as well as clearance for surgery.  Last seen in the office in February 2021.  She was still  smoking at the time.Patient was referred for pulmonary clearance with plans for the lumpectomy by [facility].  Patient had mammogram in December followed by ultrasound-guided needle biopsy confirming mammary carcinoma of the breast.  From a respiratory standpoint she is doing well.  Still using her Breztri inhaler.  OV 11/30/2021: Patient just completed radiation treatments for her breast cancer. 66  This was completed in May.  She had a flareup of COPD treated with antibiotics and steroids recently with primary care.  She still has antibiotics to complete this week.  She does feel like she is breathing better.  She is not entirely sure that is just related to the [facility] inhaler.  She said she has been using her inhalers but since she completed radiation she is feeling more short of breath.  She does feel like she is getting better after being treated with antibiotics by her primary care provider.  I encouraged her to stay on her triple therapy inhaler regimen.  OV 01/07/2022: Here today for hospital follow-up.  Was last seen by me in July for COPD.  Managed on her triple therapy inhaler regimen. Patient had a COPD exacerbation was admitted to the hospital on 12/09/2021.  Discharge summary from Dr. Grandville Silos reviewed she was treated for COPD exacerbation oxygen, IV steroids Pulmicort Brovana IV Rocephin for a 5-day course.  She was given a few doses of Lasix.  She improved and was discharged home.  She had office note on 12/21/2021 by Dr. [name] her medical oncologist.  Currently received adjuvant radiation treatments through the middle of May.  Was started on antiestrogen therapy and did not tolerate either medications.      Past Medical History:  Diagnosis Date   Acute respiratory failure with hypoxia ([place]) 09/30/2018   Breast cancer ([place])    Constipation    COPD exacerbation ([place]) 01/05/2019   Depression with anxiety 09/30/2018   Dyspnea    Essential hypertension 09/30/2018   History of  radiation therapy    Right breast- 09/01/21-10/16/21- Dr. Gery Greer   Hypertension    Tobacco dependence due to cigarettes 09/30/2018     Family History  Problem Relation Age of Onset   COPD Mother    Throat cancer Maternal Grandmother    Prostate cancer Other    Prostate cancer Other    Prostate cancer Other    Esophageal cancer Maternal Aunt      Past Surgical History:  Procedure Laterality Date   BREAST LUMPECTOMY WITH RADIOACTIVE SEED AND SENTINEL LYMPH NODE BIOPSY Right 07/23/2021   Procedure: RIGHT BREAST LUMPECTOMY WITH RADIOACTIVE SEED AND SENTINEL LYMPH NODE BIOPSY;  Surgeon: Jovita Kussmaul, MD;  Location: [facility];  Service: General;  Laterality: Right;   HAND SURGERY Right 1997   Hand Fracture (right thumb & hand)   LAPAROTOMY  1974   RIGHT/LEFT HEART CATH AND CORONARY ANGIOGRAPHY N/A 10/02/2019   Procedure: RIGHT/LEFT HEART CATH  AND CORONARY ANGIOGRAPHY;  Surgeon: Sherren Mocha, MD;  Location: [facility];  Service: Cardiovascular;  Laterality: N/A;    Social History   Socioeconomic History   Marital status: Single    Spouse name: Not on file   Number of children: Not on file   Years of education: Not on file   Highest education level: Not on file  Occupational History   Not on file  Tobacco Use   Smoking status: Former    Packs/day: 0.80    Years: 43.00    Total pack years: 34.40    Types: Cigarettes   Smokeless tobacco: Never   Tobacco comments:    Quit smoking 2 - 3 months a go 11/30/21  Vaping Use   Vaping Use: Never used  Substance and Sexual Activity   Alcohol use: Yes    Comment: gets 6 beers on a weekend and drinks them   Drug use: Not Currently    Types: Marijuana    Comment: tried to smoke some about a month ago   Sexual activity: Not on file  Other Topics Concern   Not on file  Social History Narrative   Not on file   Social Determinants of Health   Financial Resource Strain: Not on file  Food Insecurity: Not on file   Transportation Needs: Not on file  Physical Activity: Not on file  Stress: Not on file  Social Connections: Not on file  Intimate Partner Violence: Not on file     Allergies  Allergen Reactions   Crab [Shellfish Allergy] Anaphylaxis and Swelling    Dungeness crab   Erythromycin Other (See Comments)    convulsion   Hydrocodone Nausea And Vomiting    Severe vomiting      Outpatient Medications Prior to Visit  Medication Sig Dispense Refill   Budeson-Glycopyrrol-Formoterol (BREZTRI AEROSPHERE) 160-9-4.8 MCG/ACT AERO Inhale 2 puffs into the lungs in the morning and at bedtime. 32.1 g 1   fluticasone (FLONASE) 50 MCG/ACT nasal spray Place 2 sprays into both nostrils daily.     ipratropium-albuterol (DUONEB) 0.5-2.5 (3) MG/3ML SOLN Use 1 vial via nebulizer 2 times daily for 3 days then 2 times daily as need.(shortness of breath) 360 mL 1   loratadine (CLARITIN) 10 MG tablet Take 1 tablet (10 mg total) by mouth daily. 30 tablet 1   PROAIR HFA 108 (90 Base) MCG/ACT inhaler Inhale 2 puffs into the lungs every 6 (six) hours as needed for wheezing or shortness of breath. 90 day supply 18 g 3   acetaminophen (TYLENOL) 500 MG tablet Take 1,000 mg by mouth daily as needed (pain).     ALPRAZolam (XANAX) 0.5 MG tablet Take 0.5 mg by mouth at bedtime as needed for sleep.     amLODipine (NORVASC) 10 MG tablet Take 10 mg by mouth daily.     aspirin EC 81 MG tablet Take 81 mg by mouth daily.     atorvastatin (LIPITOR) 10 MG tablet Take 1 tablet (10 mg total) by mouth daily. (Patient taking differently: Take 10 mg by mouth at bedtime.) 90 tablet 3   Cholecalciferol (VITAMIN D3 PO) Take 1 tablet by mouth daily.     exemestane (AROMASIN) 25 MG tablet Take 0.5 tablets (12.5 mg total) by mouth daily after breakfast. 60 tablet 3   furosemide (LASIX) 40 MG tablet Take 1 tablet (40 mg total) by mouth daily as needed. Take 1 tablet daily x 2 days, then as needed. 20 tablet 0  metoprolol tartrate (LOPRESSOR) 25  MG tablet Take 1 tablet (25 mg total) by mouth 2 (two) times daily. 180 tablet 3   Omega-3 Fatty Acids (FISH OIL) 1000 MG CAPS Take 1,000 mg by mouth daily. (Patient not taking: Reported on 12/10/2021)     OXYGEN Inhale 3 L into the lungs continuous.     pantoprazole (PROTONIX) 40 MG tablet Take 1 tablet (40 mg total) by mouth daily at 6 (six) AM. 30 tablet 1   polyethylene glycol (MIRALAX / GLYCOLAX) 17 g packet Take 17 g by mouth daily. (Patient taking differently: Take 17 g by mouth daily as needed for mild constipation or moderate constipation.) 14 each 0   vitamin B-12 (CYANOCOBALAMIN) 500 MCG tablet Take 500 mcg by mouth daily.     Vitamin E Skin OIL Apply 1 Application topically daily.     No facility-administered medications prior to visit.    Review of Systems  Constitutional:  Negative for chills, fever, malaise/fatigue and weight loss.  HENT:  Negative for hearing loss, sore throat and tinnitus.   Eyes:  Negative for blurred vision and double vision.  Respiratory:  Positive for cough and shortness of breath. Negative for hemoptysis, sputum production, wheezing and stridor.   Cardiovascular:  Negative for chest pain, palpitations, orthopnea, leg swelling and PND.  Gastrointestinal:  Negative for abdominal pain, constipation, diarrhea, heartburn, nausea and vomiting.  Genitourinary:  Negative for dysuria, hematuria and urgency.  Musculoskeletal:  Negative for joint pain and myalgias.  Skin:  Negative for itching and rash.  Neurological:  Negative for dizziness, tingling, weakness and headaches.  Endo/Heme/Allergies:  Negative for environmental allergies. Does not bruise/bleed easily.  Psychiatric/Behavioral:  Negative for depression. The patient is not nervous/anxious and does not have insomnia.   All other systems reviewed and are negative.    Objective:  Physical Exam Vitals reviewed.  Constitutional:      General: She is not in acute distress.    Appearance: She is  well-developed.  HENT:     Head: Normocephalic and atraumatic.  Eyes:     General: No scleral icterus.    Conjunctiva/sclera: Conjunctivae normal.     Pupils: Pupils are equal, round, and reactive to light.  Neck:     Vascular: No JVD.     Trachea: No tracheal deviation.  Cardiovascular:     Rate and Rhythm: Normal rate and regular rhythm.     Heart sounds: Normal heart sounds. No murmur heard. Pulmonary:     Effort: Pulmonary effort is normal. No tachypnea, accessory muscle usage or respiratory distress.     Breath sounds: No stridor. No wheezing, rhonchi or rales.     Comments: Severely diminished breath sounds bilaterally Abdominal:     General: There is no distension.     Palpations: Abdomen is soft.     Tenderness: There is no abdominal tenderness.  Musculoskeletal:        General: No tenderness.     Cervical back: Neck supple.  Lymphadenopathy:     Cervical: No cervical adenopathy.  Skin:    General: Skin is warm and dry.     Capillary Refill: Capillary refill takes less than 2 seconds.     Findings: No rash.  Neurological:     Mental Status: She is alert and oriented to person, place, and time.  Psychiatric:        Behavior: Behavior normal.      Vitals:   01/07/22 1346  BP: (!) 102/52  Pulse: 90  SpO2: (!) 80%  Weight: 143 lb (64.9 kg)  Height: 5' 10.75" (1.797 m)   (!) 80% on RA,, letting her rest rechecking on oxygen her O2 sats come up to 92 on 2 L BMI Readings from Last 3 Encounters:  01/07/22 20.09 kg/m  12/21/21 19.43 kg/m  12/15/21 18.85 kg/m   Wt Readings from Last 3 Encounters:  01/07/22 143 lb (64.9 kg)  12/21/21 138 lb 4.8 oz (62.7 kg)  12/15/21 134 lb 3.2 oz (60.9 kg)     CBC    Component Value Date/Time   WBC 11.1 (H) 12/15/2021 0153   RBC 4.97 12/15/2021 0153   HGB 15.8 (H) 12/15/2021 0153   HGB 15.6 (H) 06/03/2021 1234   HGB 17.3 (H) 09/26/2019 1650   HCT 48.5 (H) 12/15/2021 0153   HCT 52.2 (H) 09/26/2019 1650   PLT 253  12/15/2021 0153   PLT 247 06/03/2021 1234   PLT 236 09/26/2019 1650   MCV 97.6 12/15/2021 0153   MCV 88 09/26/2019 1650   MCH 31.8 12/15/2021 0153   MCHC 32.6 12/15/2021 0153   RDW 13.2 12/15/2021 0153   RDW 19.4 (H) 09/26/2019 1650   LYMPHSABS 1.0 12/13/2021 0446   MONOABS 1.0 12/13/2021 0446   EOSABS 0.0 12/13/2021 0446   BASOSABS 0.0 12/13/2021 0446    Chest Imaging: CXR - 8/14 Cardiomegaly, evidence of emphysema  The patient's images have been independently reviewed by me.   09/30/2018 CT chest: Multiple bilateral pulmonary nodules largest 6 mm the right upper lobe recommending noncontrast CT follow-up 3 to 6 months. The patient's images have been independently reviewed by me.    CT chest 02/22/2019: Previously noted pulmonary nodules decreased in size considered benign. Diffuse bronchial wall thickening consistent with COPD history. Three-vessel coronary disease. The patient's images have been independently reviewed by me.    CT chest 12/09/2021: No evidence of PE, 5 mm right upper lobe pulmonary nodule unchanged since 2021 considered benign. Small pleural effusion The patient's images have been independently reviewed by me.    Pulmonary Functions Testing Results:     No data to display          FeNO: None   Pathology: None   05/20/2021: Breast, right, needle core biopsy, 11 o'clock 8cmfn - INVASIVE MAMMARY CARCINOMA, GRADE 2, INVOLVING FOUR OF FOUR BIOPSY FRAGMENTS. - TUMOR RANGES TO 0.8 CM IN MAXIMUM EXTENT  Echocardiogram: None   Heart Catheterization: None     Assessment & Plan:     ICD-10-CM   1. Centrilobular emphysema ([place])  J43.2     2. Chronic obstructive pulmonary disease, unspecified COPD type ([place])  J44.9     3. Chronic hypoxemic respiratory failure ([facility])  J96.11     4. Lung nodules  R91.8     5. Cigarette smoker  F17.210        Assessment:   This is a 66 year old female, centrilobular emphysema, likely advanced COPD, oxygen  dependent respiratory failure, former smoker.  She is on lung cancer screening program.  She recently has exacerbation was taken to the ER had a CT scan of the chest which was negative for any pulmonary nodules the 1 she does have a stable at 5 mm.  Recently diagnosed with breast cancer completed radiation treatments in May currently on antiestrogen therapy.  Plan: She had a recent exacerbation of COPD. She is doing better. Question full compliance with her Breztri inhaler. It sounds like she is using it as needed and not necessarily every  day. She is using her albuterol as needed as well. We talked about use of Daliresp but she does not want to have any side effects related the medicine is concerned about the diarrhea. She does not want to take azithromycin as that she has had reactions related to erythromycin in the past. She is still working with DME to try to get a portable oxygen concentrator.  Return to clinic and see Caroline Greer in 6 months for COPD management. Repeat lung cancer screening CT in July 2024.      Current Outpatient Medications:    Budeson-Glycopyrrol-Formoterol (BREZTRI AEROSPHERE) 160-9-4.8 MCG/ACT AERO, Inhale 2 puffs into the lungs in the morning and at bedtime., Disp: 32.1 g, Rfl: 1   fluticasone (FLONASE) 50 MCG/ACT nasal spray, Place 2 sprays into both nostrils daily., Disp: , Rfl:    ipratropium-albuterol (DUONEB) 0.5-2.5 (3) MG/3ML SOLN, Use 1 vial via nebulizer 2 times daily for 3 days then 2 times daily as need.(shortness of breath), Disp: 360 mL, Rfl: 1   loratadine (CLARITIN) 10 MG tablet, Take 1 tablet (10 mg total) by mouth daily., Disp: 30 tablet, Rfl: 1   PROAIR HFA 108 (90 Base) MCG/ACT inhaler, Inhale 2 puffs into the lungs every 6 (six) hours as needed for wheezing or shortness of breath. 90 day supply, Disp: 18 g, Rfl: 3   acetaminophen (TYLENOL) 500 MG tablet, Take 1,000 mg by mouth daily as needed (pain)., Disp: , Rfl:    ALPRAZolam (XANAX) 0.5 MG tablet,  Take 0.5 mg by mouth at bedtime as needed for sleep., Disp: , Rfl:    amLODipine (NORVASC) 10 MG tablet, Take 10 mg by mouth daily., Disp: , Rfl:    aspirin EC 81 MG tablet, Take 81 mg by mouth daily., Disp: , Rfl:    atorvastatin (LIPITOR) 10 MG tablet, Take 1 tablet (10 mg total) by mouth daily. (Patient taking differently: Take 10 mg by mouth at bedtime.), Disp: 90 tablet, Rfl: 3   Cholecalciferol (VITAMIN D3 PO), Take 1 tablet by mouth daily., Disp: , Rfl:    exemestane (AROMASIN) 25 MG tablet, Take 0.5 tablets (12.5 mg total) by mouth daily after breakfast., Disp: 60 tablet, Rfl: 3   furosemide (LASIX) 40 MG tablet, Take 1 tablet (40 mg total) by mouth daily as needed. Take 1 tablet daily x 2 days, then as needed., Disp: 20 tablet, Rfl: 0   metoprolol tartrate (LOPRESSOR) 25 MG tablet, Take 1 tablet (25 mg total) by mouth 2 (two) times daily., Disp: 180 tablet, Rfl: 3   Omega-3 Fatty Acids (FISH OIL) 1000 MG CAPS, Take 1,000 mg by mouth daily. (Patient not taking: Reported on 12/10/2021), Disp: , Rfl:    OXYGEN, Inhale 3 L into the lungs continuous., Disp: , Rfl:    pantoprazole (PROTONIX) 40 MG tablet, Take 1 tablet (40 mg total) by mouth daily at 6 (six) AM., Disp: 30 tablet, Rfl: 1   polyethylene glycol (MIRALAX / GLYCOLAX) 17 g packet, Take 17 g by mouth daily. (Patient taking differently: Take 17 g by mouth daily as needed for mild constipation or moderate constipation.), Disp: 14 each, Rfl: 0   vitamin B-12 (CYANOCOBALAMIN) 500 MCG tablet, Take 500 mcg by mouth daily., Disp: , Rfl:    Vitamin E Skin OIL, Apply 1 Application topically daily., Disp: , Rfl:    Garner Nash, DO [facility] 01/07/2022 2:23 PM

## 2022-01-08 ENCOUNTER — Encounter: Payer: Self-pay | Admitting: *Deleted

## 2022-01-10 ENCOUNTER — Inpatient Hospital Stay (HOSPITAL_COMMUNITY)
Admission: EM | Admit: 2022-01-10 | Discharge: 2022-01-13 | DRG: 190 | Disposition: A | Discharge status: Home Health | Payer: Medicare Other | Attending: Internal Medicine | Admitting: Internal Medicine

## 2022-01-10 ENCOUNTER — Emergency Department (HOSPITAL_COMMUNITY): Payer: Medicare Other

## 2022-01-10 ENCOUNTER — Other Ambulatory Visit: Payer: Self-pay

## 2022-01-10 ENCOUNTER — Encounter (HOSPITAL_COMMUNITY): Payer: Self-pay

## 2022-01-10 DIAGNOSIS — Z800 Family history of malignant neoplasm of digestive organs: Secondary | ICD-10-CM

## 2022-01-10 DIAGNOSIS — J9621 Acute and chronic respiratory failure with hypoxia: Secondary | ICD-10-CM | POA: Diagnosis not present

## 2022-01-10 DIAGNOSIS — I509 Heart failure, unspecified: Secondary | ICD-10-CM | POA: Diagnosis present

## 2022-01-10 DIAGNOSIS — I110 Hypertensive heart disease with heart failure: Secondary | ICD-10-CM | POA: Diagnosis present

## 2022-01-10 DIAGNOSIS — J90 Pleural effusion, not elsewhere classified: Secondary | ICD-10-CM | POA: Diagnosis not present

## 2022-01-10 DIAGNOSIS — Z170 Estrogen receptor positive status [ER+]: Secondary | ICD-10-CM

## 2022-01-10 DIAGNOSIS — Z9981 Dependence on supplemental oxygen: Secondary | ICD-10-CM

## 2022-01-10 DIAGNOSIS — J9611 Chronic respiratory failure with hypoxia: Secondary | ICD-10-CM

## 2022-01-10 DIAGNOSIS — J9601 Acute respiratory failure with hypoxia: Secondary | ICD-10-CM

## 2022-01-10 DIAGNOSIS — F32A Depression, unspecified: Secondary | ICD-10-CM | POA: Diagnosis present

## 2022-01-10 DIAGNOSIS — J441 Chronic obstructive pulmonary disease with (acute) exacerbation: Principal | ICD-10-CM | POA: Diagnosis present

## 2022-01-10 DIAGNOSIS — I10 Essential (primary) hypertension: Secondary | ICD-10-CM | POA: Diagnosis present

## 2022-01-10 DIAGNOSIS — Z923 Personal history of irradiation: Secondary | ICD-10-CM

## 2022-01-10 DIAGNOSIS — Z20822 Contact with and (suspected) exposure to covid-19: Secondary | ICD-10-CM | POA: Diagnosis present

## 2022-01-10 DIAGNOSIS — C50411 Malignant neoplasm of upper-outer quadrant of right female breast: Secondary | ICD-10-CM | POA: Diagnosis present

## 2022-01-10 DIAGNOSIS — Z825 Family history of asthma and other chronic lower respiratory diseases: Secondary | ICD-10-CM

## 2022-01-10 DIAGNOSIS — N61 Mastitis without abscess: Secondary | ICD-10-CM | POA: Diagnosis present

## 2022-01-10 DIAGNOSIS — Z808 Family history of malignant neoplasm of other organs or systems: Secondary | ICD-10-CM

## 2022-01-10 DIAGNOSIS — K219 Gastro-esophageal reflux disease without esophagitis: Secondary | ICD-10-CM | POA: Diagnosis present

## 2022-01-10 DIAGNOSIS — F17210 Nicotine dependence, cigarettes, uncomplicated: Secondary | ICD-10-CM | POA: Diagnosis present

## 2022-01-10 DIAGNOSIS — F419 Anxiety disorder, unspecified: Secondary | ICD-10-CM | POA: Diagnosis present

## 2022-01-10 DIAGNOSIS — Z79811 Long term (current) use of aromatase inhibitors: Secondary | ICD-10-CM

## 2022-01-10 LAB — MRSA NEXT GEN BY PCR, NASAL: MRSA by PCR Next Gen: NOT DETECTED

## 2022-01-10 LAB — CBC WITH DIFFERENTIAL/PLATELET
Abs Immature Granulocytes: 0.02 10*3/uL (ref 0.00–0.07)
Basophils Absolute: 0 10*3/uL (ref 0.0–0.1)
Basophils Relative: 0 %
Eosinophils Absolute: 0 10*3/uL (ref 0.0–0.5)
Eosinophils Relative: 0 %
HCT: 43.4 % (ref 36.0–46.0)
Hemoglobin: 13.7 g/dL (ref 12.0–15.0)
Immature Granulocytes: 0 %
Lymphocytes Relative: 10 %
Lymphs Abs: 0.6 10*3/uL — ABNORMAL LOW (ref 0.7–4.0)
MCH: 30.4 pg (ref 26.0–34.0)
MCHC: 31.6 g/dL (ref 30.0–36.0)
MCV: 96.2 fL (ref 80.0–100.0)
Monocytes Absolute: 0.2 10*3/uL (ref 0.1–1.0)
Monocytes Relative: 2 %
Neutro Abs: 5.5 10*3/uL (ref 1.7–7.7)
Neutrophils Relative %: 88 %
Platelets: 356 10*3/uL (ref 150–400)
RBC: 4.51 MIL/uL (ref 3.87–5.11)
RDW: 14.5 % (ref 11.5–15.5)
WBC: 6.3 10*3/uL (ref 4.0–10.5)
nRBC: 0 % (ref 0.0–0.2)

## 2022-01-10 LAB — SARS CORONAVIRUS 2 BY RT PCR: SARS Coronavirus 2 by RT PCR: NEGATIVE

## 2022-01-10 LAB — COMPREHENSIVE METABOLIC PANEL
ALT: 17 U/L (ref 0–44)
AST: 14 U/L — ABNORMAL LOW (ref 15–41)
Albumin: 3.4 g/dL — ABNORMAL LOW (ref 3.5–5.0)
Alkaline Phosphatase: 59 U/L (ref 38–126)
Anion gap: 10 (ref 5–15)
BUN: 11 mg/dL (ref 8–23)
CO2: 31 mmol/L (ref 22–32)
Calcium: 9.1 mg/dL (ref 8.9–10.3)
Chloride: 95 mmol/L — ABNORMAL LOW (ref 98–111)
Creatinine, Ser: 0.48 mg/dL (ref 0.44–1.00)
GFR, Estimated: >60 mL/min (ref >60)
Glucose, Bld: 115 mg/dL — ABNORMAL HIGH (ref 70–99)
Potassium: 4.1 mmol/L (ref 3.5–5.1)
Sodium: 136 mmol/L (ref 135–145)
Total Bilirubin: 0.7 mg/dL (ref 0.3–1.2)
Total Protein: 6.7 g/dL (ref 6.5–8.1)

## 2022-01-10 LAB — LACTIC ACID, PLASMA
Lactic Acid, Venous: 1.1 mmol/L (ref 0.5–1.9)
Lactic Acid, Venous: 1.7 mmol/L (ref 0.5–1.9)

## 2022-01-10 LAB — BRAIN NATRIURETIC PEPTIDE: B Natriuretic Peptide: 64.3 pg/mL (ref 0.0–100.0)

## 2022-01-10 LAB — TROPONIN I (HIGH SENSITIVITY): Troponin I (High Sensitivity): 5 ng/L (ref <18)

## 2022-01-10 MED ORDER — IOHEXOL 350 MG/ML SOLN
73.0000 mL | Freq: Once | INTRAVENOUS | Status: AC | PRN
  Administered 2022-01-10: 73 mL via INTRAVENOUS

## 2022-01-10 MED ORDER — SODIUM CHLORIDE 0.9 % IV SOLN
1.0000 g | INTRAVENOUS | Status: DC
  Administered 2022-01-10 – 2022-01-12 (×3): 1 g via INTRAVENOUS
  Filled 2022-01-10 (×4): qty 10

## 2022-01-10 MED ORDER — METHYLPREDNISOLONE SODIUM SUCC 125 MG IJ SOLR
125.0000 mg | Freq: Once | INTRAMUSCULAR | Status: AC
  Administered 2022-01-10: 125 mg via INTRAVENOUS
  Filled 2022-01-10: qty 2

## 2022-01-10 MED ORDER — ALBUTEROL SULFATE (2.5 MG/3ML) 0.083% IN NEBU
10.0000 mg/h | INHALATION_SOLUTION | RESPIRATORY_TRACT | Status: DC
Start: 2022-01-10 — End: 2022-01-11
  Administered 2022-01-10: 10 mg/h via RESPIRATORY_TRACT
  Filled 2022-01-10: qty 3
  Filled 2022-01-10: qty 15

## 2022-01-10 MED ORDER — FUROSEMIDE 10 MG/ML IJ SOLN
40.0000 mg | Freq: Once | INTRAMUSCULAR | Status: AC
  Administered 2022-01-10: 40 mg via INTRAVENOUS
  Filled 2022-01-10: qty 4

## 2022-01-10 MED ORDER — MORPHINE SULFATE (PF) 2 MG/ML IV SOLN
2.0000 mg | Freq: Once | INTRAVENOUS | Status: AC
  Administered 2022-01-10: 2 mg via INTRAVENOUS
  Filled 2022-01-10: qty 1

## 2022-01-10 MED ORDER — ALBUTEROL SULFATE (2.5 MG/3ML) 0.083% IN NEBU: 2.5000 mg | INHALATION_SOLUTION | RESPIRATORY_TRACT | Status: DC | PRN

## 2022-01-10 MED ORDER — ONDANSETRON HCL 4 MG/2ML IJ SOLN
4.0000 mg | Freq: Once | INTRAMUSCULAR | Status: AC
Start: 2022-01-10 — End: 2022-01-10
  Administered 2022-01-10: 4 mg via INTRAVENOUS
  Filled 2022-01-10: qty 2

## 2022-01-10 MED ORDER — ENOXAPARIN SODIUM 40 MG/0.4ML IJ SOSY
40.0000 mg | PREFILLED_SYRINGE | INTRAMUSCULAR | Status: DC
  Administered 2022-01-11: 40 mg via SUBCUTANEOUS
  Filled 2022-01-10 (×3): qty 0.4

## 2022-01-10 MED ORDER — METHYLPREDNISOLONE SODIUM SUCC 40 MG IJ SOLR
40.0000 mg | Freq: Two times a day (BID) | INTRAMUSCULAR | Status: DC
Start: 2022-01-11 — End: 2022-01-13
  Administered 2022-01-11 – 2022-01-13 (×5): 40 mg via INTRAVENOUS
  Filled 2022-01-10 (×5): qty 1

## 2022-01-10 MED ORDER — IPRATROPIUM-ALBUTEROL 0.5-2.5 (3) MG/3ML IN SOLN
3.0000 mL | Freq: Once | RESPIRATORY_TRACT | Status: AC
  Administered 2022-01-10: 3 mL via RESPIRATORY_TRACT
  Filled 2022-01-10: qty 3

## 2022-01-10 MED ORDER — MOMETASONE FURO-FORMOTEROL FUM 200-5 MCG/ACT IN AERO
2.0000 | INHALATION_SPRAY | Freq: Two times a day (BID) | RESPIRATORY_TRACT | Status: DC
  Filled 2022-01-10: qty 8.8

## 2022-01-10 MED ORDER — UMECLIDINIUM BROMIDE 62.5 MCG/ACT IN AEPB
1.0000 | INHALATION_SPRAY | Freq: Every day | RESPIRATORY_TRACT | Status: DC
  Filled 2022-01-10: qty 7

## 2022-01-10 NOTE — Assessment & Plan Note (Signed)
Med rec pending. Resume home BP meds in AM (assuming shes off of BIPAP by then).

## 2022-01-10 NOTE — Assessment & Plan Note (Addendum)
Pt currently on Aromasin (exemestane). Now with erythema of R breast: Infection vs development of IBC? 1. Cont aromasin 2. Treat skin changes as cellulitis as above with rocephin for the moment 3. If not rapidly improving on ABx, call Dr. [name]. 1. Actually ill go ahead and send him (And Dr. Irene Limbo who is on call) a secure chat anyhow.

## 2022-01-10 NOTE — Progress Notes (Signed)
Pt taken off bipap for a break. Pt placed on 12L HFNC.

## 2022-01-10 NOTE — Assessment & Plan Note (Addendum)
Appears to have cellulitis of R breast near area where she had lumpectomy previously. DDx includes CA recurrence, development of inflammatory breast cancer, radiation cellulitis.  Dont think Aromasin would cause swelling? 1. Rocephin empirically for cellulitis 2. Check MRSA PCR nares (add MRSA coverage if positive). 3. If persistent despite treatment: call Dr. [name]

## 2022-01-10 NOTE — ED Notes (Signed)
Nurse explained plan of care , cause of delay and treatment plan . Beside commode set up per patient request . BIPAP applied by RT.

## 2022-01-10 NOTE — Progress Notes (Signed)
Pt placed on BIPAP per MD order. Pt on documented settings. Tolerating well at this time.

## 2022-01-10 NOTE — H&P (Signed)
History and Physical    Patient: Caroline Greer YKZ:[id number] DOB: 15-Jul-1955 DOA: 01/10/2022 DOS: the patient was seen and examined on 01/10/2022 PCP: [name], MD  Patient coming from: Home  Chief Complaint:  Chief Complaint  Patient presents with   Shortness of Breath   HPI: Caroline Greer is a 66 y.o. female with medical history significant of COPD 3L O2 at baseline, HTN, breast CA s/p lumpectomy earlier this year, radiation in April-May, now on anti-estrogen therapy with Aromasin.  Pt admitted to hospital almost exactly 1 month ago for COPD exacerbation with hypoxic resp failure.  Unfortunately pt continues to smoke as of Pulm follow up office visit x3 days ago.  Pt presents to ED with c/o SOB, onset earlier today.  EMS came to house around 0300 this AM, gave solumedrol and duoneb, felt better so didn't want to go to ED.  SOB worsened though so she called EMS again, this time in to ED.  Pt satting 82% on her baseline 3L.  Pt initially on 6L in ED, though O2 sat dropped even on this and wasn't coming up with duonebs so now pt on rescue BIPAP.  Pt also complains about some swelling to her right breast.  This is the site of her lumpectomy.  Pt said that just started a few days ago.  She thinks it is from the new med Dr. [name] gave to her on 7/31(exemestane)    Review of Systems: As mentioned in the history of present illness. All other systems reviewed and are negative. Past Medical History:  Diagnosis Date   Acute respiratory failure with hypoxia ([place]) 09/30/2018   Breast cancer ([place])    Constipation    COPD exacerbation ([place]) 01/05/2019   Depression with anxiety 09/30/2018   Dyspnea    Essential hypertension 09/30/2018   History of radiation therapy    Right breast- 09/01/21-10/16/21- Dr. Gery Pray   Hypertension    Tobacco dependence due to cigarettes 09/30/2018   Past Surgical History:  Procedure Laterality Date   BREAST LUMPECTOMY WITH RADIOACTIVE SEED AND  SENTINEL LYMPH NODE BIOPSY Right 07/23/2021   Procedure: RIGHT BREAST LUMPECTOMY WITH RADIOACTIVE SEED AND SENTINEL LYMPH NODE BIOPSY;  Surgeon: Jovita Kussmaul, MD;  Location: [facility];  Service: General;  Laterality: Right;   HAND SURGERY Right 1997   Hand Fracture (right thumb & hand)   LAPAROTOMY  1974   RIGHT/LEFT HEART CATH AND CORONARY ANGIOGRAPHY N/A 10/02/2019   Procedure: RIGHT/LEFT HEART CATH AND CORONARY ANGIOGRAPHY;  Surgeon: Sherren Mocha, MD;  Location: [facility];  Service: Cardiovascular;  Laterality: N/A;   Social History:  reports that she has quit smoking. Her smoking use included cigarettes. She has a 34.40 pack-year smoking history. She has never used smokeless tobacco. She reports current alcohol use. She reports that she does not currently use drugs after having used the following drugs: Marijuana.  Allergies  Allergen Reactions   Crab [Shellfish Allergy] Anaphylaxis and Swelling    Dungeness crab   Erythromycin Other (See Comments)    convulsion   Hydrocodone Nausea And Vomiting    Severe vomiting     Family History  Problem Relation Age of Onset   COPD Mother    Throat cancer Maternal Grandmother    Prostate cancer Other    Prostate cancer Other    Prostate cancer Other    Esophageal cancer Maternal Aunt     Prior to Admission medications   Medication Sig Start Date End Date Taking?  Authorizing Provider  acetaminophen (TYLENOL) 500 MG tablet Take 1,000 mg by mouth daily as needed (pain).    [provider]  ALPRAZolam Duanne Moron) 0.5 MG tablet Take 0.5 mg by mouth at bedtime as needed for sleep. 10/31/18   [provider]  amLODipine (NORVASC) 10 MG tablet Take 10 mg by mouth daily. 11/09/21   [provider]  aspirin EC 81 MG tablet Take 81 mg by mouth daily.    [provider]  atorvastatin (LIPITOR) 10 MG tablet Take 1 tablet (10 mg total) by mouth daily. Patient taking differently: Take 10 mg by mouth at bedtime.  06/26/21   Sueanne Margarita, MD  Budeson-Glycopyrrol-Formoterol (BREZTRI AEROSPHERE) 160-9-4.8 MCG/ACT AERO Inhale 2 puffs into the lungs in the morning and at bedtime. 03/05/20   Garner Nash, DO  Cholecalciferol (VITAMIN D3 PO) Take 1 tablet by mouth daily.    [provider]  exemestane (AROMASIN) 25 MG tablet Take 0.5 tablets (12.5 mg total) by mouth daily after breakfast. 12/21/21   Nicholas Lose, MD  fluticasone (FLONASE) 50 MCG/ACT nasal spray Place 2 sprays into both nostrils daily.    [provider]  furosemide (LASIX) 40 MG tablet Take 1 tablet (40 mg total) by mouth daily as needed. Take 1 tablet daily x 2 days, then as needed. 12/15/21   Eugenie Filler, MD  ipratropium-albuterol (DUONEB) 0.5-2.5 (3) MG/3ML SOLN Use 1 vial via nebulizer 2 times daily for 3 days then 2 times daily as need.(shortness of breath) 12/15/21   Eugenie Filler, MD  loratadine (CLARITIN) 10 MG tablet Take 1 tablet (10 mg total) by mouth daily. 12/16/21   Eugenie Filler, MD  metoprolol tartrate (LOPRESSOR) 25 MG tablet Take 1 tablet (25 mg total) by mouth 2 (two) times daily. 06/12/21   Loel Dubonnet, NP  Omega-3 Fatty Acids (FISH OIL) 1000 MG CAPS Take 1,000 mg by mouth daily. Patient not taking: Reported on 12/10/2021    [provider]  OXYGEN Inhale 3 L into the lungs continuous.    [provider]  pantoprazole (PROTONIX) 40 MG tablet Take 1 tablet (40 mg total) by mouth daily at 6 (six) AM. 12/16/21   Eugenie Filler, MD  polyethylene glycol (MIRALAX / GLYCOLAX) 17 g packet Take 17 g by mouth daily. Patient taking differently: Take 17 g by mouth daily as needed for mild constipation or moderate constipation. 01/07/19   Arrien, Jimmy Picket, MD  PROAIR HFA [phone] Base) MCG/ACT inhaler Inhale 2 puffs into the lungs every 6 (six) hours as needed for wheezing or shortness of breath. 90 day supply 10/09/19   Icard, Leory Plowman L, DO  vitamin B-12 (CYANOCOBALAMIN) 500  MCG tablet Take 500 mcg by mouth daily.    [provider]  Vitamin E Skin OIL Apply 1 Application topically daily.    [provider]    Physical Exam: Vitals:   01/10/22 1957 01/10/22 1958 01/10/22 2015 01/10/22 2115  BP:   108/66 134/65  Pulse: (!) 102 (!) 108 (!) 101 95  Resp: (!) '22 18 19 '$ (!) 23  Temp:      TempSrc:      SpO2:  97% 100% 92%  Weight:      Height:       Constitutional: Pt on rescue BIPAP Eyes: PERRL, lids and conjunctivae normal ENMT: Mucous membranes are moist. Posterior pharynx clear of any exudate or lesions.Normal dentition.  Neck: normal, supple, no masses, no thyromegaly Respiratory:  Tachypnea and wheezing present Cardiovascular: Regular rate and rhythm, no murmurs / rubs / gallops. No extremity edema. 2+ pedal pulses. No carotid bruits.  Abdomen: no tenderness, no masses palpated. No hepatosplenomegaly. Bowel sounds positive.  Musculoskeletal: no clubbing / cyanosis. No joint deformity upper and lower extremities. Good ROM, no contractures. Normal muscle tone.  Skin: Erythema and edema of R breast.  LLE bruising to shin (hit leg on shower part). Neurologic: CN 2-12 grossly intact. Sensation intact, DTR normal. Strength 5/5 in all 4.  Psychiatric: Normal judgment and insight. Alert and oriented x 3. Normal mood.   Data Reviewed:    CTA CHEST: IMPRESSION: 1. No pulmonary embolus. 2. Moderate right and small to moderate left pleural effusions, increased from CT last month. There is associated compressive atelectasis. 3. Slight smooth septal thickening may represent pulmonary edema. 4. Unchanged prominent bilateral hilar and mediastinal lymph nodes, likely reactive.  CBC    Component Value Date/Time   WBC 6.3 01/10/2022 1615   RBC 4.51 01/10/2022 1615   HGB 13.7 01/10/2022 1615   HGB 15.6 (H) 06/03/2021 1234   HGB 17.3 (H) 09/26/2019 1650   HCT 43.4 01/10/2022 1615   HCT 52.2 (H) 09/26/2019 1650   PLT 356 01/10/2022 1615    PLT 247 06/03/2021 1234   PLT 236 09/26/2019 1650   MCV 96.2 01/10/2022 1615   MCV 88 09/26/2019 1650   MCH 30.4 01/10/2022 1615   MCHC 31.6 01/10/2022 1615   RDW 14.5 01/10/2022 1615   RDW 19.4 (H) 09/26/2019 1650   LYMPHSABS 0.6 (L) 01/10/2022 1615   MONOABS 0.2 01/10/2022 1615   EOSABS 0.0 01/10/2022 1615   BASOSABS 0.0 01/10/2022 1615   CMP     Component Value Date/Time   NA 136 01/10/2022 1615   NA 139 05/13/2020 1436   K 4.1 01/10/2022 1615   CL 95 (L) 01/10/2022 1615   CO2 31 01/10/2022 1615   GLUCOSE 115 (H) 01/10/2022 1615   BUN 11 01/10/2022 1615   BUN 6 (L) 05/13/2020 1436   CREATININE 0.48 01/10/2022 1615   CREATININE 0.57 06/03/2021 1234   CALCIUM 9.1 01/10/2022 1615   PROT 6.7 01/10/2022 1615   PROT 7.1 05/13/2020 1436   ALBUMIN 3.4 (L) 01/10/2022 1615   ALBUMIN 4.3 05/13/2020 1436   AST 14 (L) 01/10/2022 1615   AST 15 06/03/2021 1234   ALT 17 01/10/2022 1615   ALT 14 06/03/2021 1234   ALKPHOS 59 01/10/2022 1615   BILITOT 0.7 01/10/2022 1615   BILITOT 0.5 06/03/2021 1234   GFRNONAA >60 01/10/2022 1615   GFRNONAA >60 06/03/2021 1234   GFRAA 95 05/13/2020 1436     Assessment and Plan: * COPD exacerbation (HCC) COPD pathway Solumedrol Scheduled LABA/LAMA/INH steroid PRN SABA Rocephin empirically (also for cellulitis)  Acute on chronic respiratory failure with hypoxia (HCC) On rescue BIPAP for COPD exacerbation. Tele monitor Cont pulse ox  Pleural effusion, bilateral Mod right, small to mod left. Increased in size from last month. BNP normal range 2d echo from last month shows nl EF, grade 1 diastolic dysfunction. May wish to considering sampling for diagnostic purposes. Not sure they are large enough that draining them would have much in the way of therapeutic benefit to her breathing though.  Cellulitis of right breast Appears to have cellulitis of R breast near area where she had lumpectomy previously. DDx includes CA recurrence,  development of inflammatory breast cancer, radiation cellulitis.  Dont think Aromasin would cause swelling? Rocephin empirically for  cellulitis Check MRSA PCR nares (add MRSA coverage if positive). If persistent despite treatment: call Dr. [name]  Malignant neoplasm of upper-outer quadrant of right breast in female, estrogen receptor positive ([place]) Pt currently on Aromasin (exemestane). Now with erythema of R breast: Infection vs development of IBC? Cont aromasin Treat skin changes as cellulitis as above with rocephin for the moment If not rapidly improving on ABx, call Dr. [name]. Actually ill go ahead and send him (And Dr. Irene Limbo who is on call) a secure chat anyhow.  Tobacco dependence due to cigarettes In contrast to Pulm note from 3 days ago.  She assures me she has already quit smoking, doesn't need nicotine patch.  Last cigarette was before COPD exacerbation last month.  Essential hypertension Med rec pending. Resume home BP meds in AM (assuming shes off of BIPAP by then).  Chronic respiratory failure with hypoxia, on home O2 therapy (HCC) - 3 L/min Due to COPD. Still smoking.      Advance Care Planning:   Code Status: Full Code  Consults: None  Family Communication: No family in room  Severity of Illness: The appropriate patient status for this patient is OBSERVATION. Observation status is judged to be reasonable and necessary in order to provide the required intensity of service to ensure the patient's safety. The patient's presenting symptoms, physical exam findings, and initial radiographic and laboratory data in the context of their medical condition is felt to place them at decreased risk for further clinical deterioration. Furthermore, it is anticipated that the patient will be medically stable for discharge from the hospital within 2 midnights of admission.   Author: Etta Quill., DO 01/10/2022 9:37 PM  For on call review [email or web address].

## 2022-01-10 NOTE — ED Triage Notes (Addendum)
Pt here via [facility] from home for "difficulty breathing" Per pt, she also called ems at 0300 today and was given solumedrol anda duoneb and felt better, but was getting worse again throughout the day. Initial SpO2 on RA was 70s, ems gave 1 duoneb and SpO2 increased to 98%. 114/66, 90HR. Pt is on 3L O2 at baseline.

## 2022-01-10 NOTE — Assessment & Plan Note (Signed)
Due to COPD. Still smoking.

## 2022-01-10 NOTE — Assessment & Plan Note (Addendum)
Mod right, small to mod left. Increased in size from last month. 1. BNP normal range 2. 2d echo from last month shows nl EF, grade 1 diastolic dysfunction. 3. May wish to considering sampling for diagnostic purposes. 4. Not sure they are large enough that draining them would have much in the way of therapeutic benefit to her breathing though.

## 2022-01-10 NOTE — ED Provider Triage Note (Signed)
Emergency Medicine Provider Triage Evaluation Note  Caroline Greer , a 65 y.o. female  was evaluated in triage.  Pt complains of shortness of breath.  Has history of COPD.  Called EMS around 03 100 and was given DuoNeb, Solu-Medrol with improvement.  Shortness of breath returned around 07 30 this morning.  Patient reports with the initial episode she did have some chest pain.  Has history of breast cancer status post radiation..  Review of Systems  Positive: As above Negative: As above  Physical Exam  BP 119/73 (BP Location: Left Arm)   Pulse 94   Temp 99.8 F (37.7 C) (Oral)   Resp 17   SpO2 (!) 82%  Gen:   Awake, no distress   Resp:  Normal effort  MSK:   Moves extremities without difficulty  Other:    Medical Decision Making  Medically screening exam initiated at 4:17 PM.  Appropriate orders placed.  Caroline Greer was informed that the remainder of the evaluation will be completed by another provider, this initial triage assessment does not replace that evaluation, and the importance of remaining in the ED until their evaluation is complete.  Initially hypoxic on baseline 3 L.  Patient increased to 6 L and currently satting low 90s.   Evlyn Courier, PA-C 01/10/22 [phone]

## 2022-01-10 NOTE — ED Provider Notes (Signed)
[facility] EMERGENCY DEPARTMENT Provider Note   CSN: [id number] Arrival date & time: 01/10/22  1534     History  Chief Complaint  Patient presents with   Shortness of Breath    Caroline Greer is a 66 y.o. female.  Pt is a 66 yo female with a pmhx significant for COPD, HTN, and breast cancer.  Pt said she has been sob all day.  EMS came to the house around 0300 this am and gave her solumedrol and a duoneb.  She felt better, so did not want to go to the ED.  However, sob worsened, so she called EMS again.  PT is on 3L at baseline.  Her O2 sat was 82% on 3L, so oxygen increased to 6L in triage.  Pt admitted from 7/19-7/23 for a COPD exacerbation.  Just moving from the wheelchair to the bed, pt's O2 sat dropped to the low 70s on 6L.  It took several minutes to come up to the mid-80s and did not come up to the low 90s until a duoneb was started.  Pt also complains about some swelling to her right breast.  This is the site of her lumpectomy.  Pt said that just started a few days ago.  She thinks it is from the new med Dr. [name] gave to her on 7/31(exemestane)        Home Medications Prior to Admission medications   Medication Sig Start Date End Date Taking? Authorizing Provider  acetaminophen (TYLENOL) 500 MG tablet Take 1,000 mg by mouth daily as needed (pain).    [provider]  ALPRAZolam Duanne Moron) 0.5 MG tablet Take 0.5 mg by mouth at bedtime as needed for sleep. 10/31/18   [provider]  amLODipine (NORVASC) 10 MG tablet Take 10 mg by mouth daily. 11/09/21   [provider]  aspirin EC 81 MG tablet Take 81 mg by mouth daily.    [provider]  atorvastatin (LIPITOR) 10 MG tablet Take 1 tablet (10 mg total) by mouth daily. Patient taking differently: Take 10 mg by mouth at bedtime. 06/26/21   Sueanne Margarita, MD  Budeson-Glycopyrrol-Formoterol (BREZTRI AEROSPHERE) 160-9-4.8 MCG/ACT AERO Inhale 2 puffs into the lungs in the morning  and at bedtime. 03/05/20   Garner Nash, DO  Cholecalciferol (VITAMIN D3 PO) Take 1 tablet by mouth daily.    [provider]  exemestane (AROMASIN) 25 MG tablet Take 0.5 tablets (12.5 mg total) by mouth daily after breakfast. 12/21/21   Nicholas Lose, MD  fluticasone (FLONASE) 50 MCG/ACT nasal spray Place 2 sprays into both nostrils daily.    [provider]  furosemide (LASIX) 40 MG tablet Take 1 tablet (40 mg total) by mouth daily as needed. Take 1 tablet daily x 2 days, then as needed. 12/15/21   Eugenie Filler, MD  ipratropium-albuterol (DUONEB) 0.5-2.5 (3) MG/3ML SOLN Use 1 vial via nebulizer 2 times daily for 3 days then 2 times daily as need.(shortness of breath) 12/15/21   Eugenie Filler, MD  loratadine (CLARITIN) 10 MG tablet Take 1 tablet (10 mg total) by mouth daily. 12/16/21   Eugenie Filler, MD  metoprolol tartrate (LOPRESSOR) 25 MG tablet Take 1 tablet (25 mg total) by mouth 2 (two) times daily. 06/12/21   Loel Dubonnet, NP  Omega-3 Fatty Acids (FISH OIL) 1000 MG CAPS Take 1,000 mg by mouth daily. Patient not taking: Reported on 12/10/2021    [provider]  OXYGEN Inhale 3  L into the lungs continuous.    [provider]  pantoprazole (PROTONIX) 40 MG tablet Take 1 tablet (40 mg total) by mouth daily at 6 (six) AM. 12/16/21   Eugenie Filler, MD  polyethylene glycol (MIRALAX / GLYCOLAX) 17 g packet Take 17 g by mouth daily. Patient taking differently: Take 17 g by mouth daily as needed for mild constipation or moderate constipation. 01/07/19   Arrien, Jimmy Picket, MD  PROAIR HFA 108 [phone] Base) MCG/ACT inhaler Inhale 2 puffs into the lungs every 6 (six) hours as needed for wheezing or shortness of breath. 90 day supply 10/09/19   Icard, Leory Plowman L, DO  vitamin B-12 (CYANOCOBALAMIN) 500 MCG tablet Take 500 mcg by mouth daily.    [provider]  Vitamin E Skin OIL Apply 1 Application topically daily.    [provider]      Allergies    Otho Darner allergy], Erythromycin, and Hydrocodone    Review of Systems   Review of Systems  Respiratory:  Positive for shortness of breath.   All other systems reviewed and are negative.   Physical Exam Updated Vital Signs BP 108/66   Pulse (!) 101   Temp 98.9 F (37.2 C) (Oral)   Resp 19   Ht '5\' 10"'$  (1.778 m)   Wt 63.5 kg   SpO2 100%   BMI 20.09 kg/m  Physical Exam Vitals and nursing note reviewed.  Constitutional:      General: She is in acute distress.     Appearance: She is ill-appearing.  HENT:     Head: Normocephalic and atraumatic.     Mouth/Throat:     Mouth: Mucous membranes are moist.     Pharynx: Oropharynx is clear.  Eyes:     Extraocular Movements: Extraocular movements intact.     Pupils: Pupils are equal, round, and reactive to light.  Cardiovascular:     Rate and Rhythm: Normal rate and regular rhythm.  Pulmonary:     Effort: Tachypnea present.     Breath sounds: Wheezing present.  Abdominal:     General: Bowel sounds are normal.     Palpations: Abdomen is soft.     Comments: Right breast red and swollen  Musculoskeletal:        General: Normal range of motion.     Cervical back: Normal range of motion and neck supple.     Comments: LLE with bruising to shin from hitting her leg on a shower part  Skin:    General: Skin is warm.     Capillary Refill: Capillary refill takes less than 2 seconds.  Neurological:     General: No focal deficit present.     Mental Status: She is alert and oriented to person, place, and time.  Psychiatric:        Mood and Affect: Mood normal.        Behavior: Behavior normal.     ED Results / Procedures / Treatments   Labs (all labs ordered are listed, but only abnormal results are displayed) Labs Reviewed  CBC WITH DIFFERENTIAL/PLATELET - Abnormal; Notable for the following components:      Result Value   Lymphs Abs 0.6 (*)    All other components within normal limits   COMPREHENSIVE METABOLIC PANEL - Abnormal; Notable for the following components:   Chloride 95 (*)    Glucose, Bld 115 (*)    Albumin 3.4 (*)    AST 14 (*)    All  other components within normal limits  SARS CORONAVIRUS 2 BY RT PCR  MRSA NEXT GEN BY PCR, NASAL  LACTIC ACID, PLASMA  LACTIC ACID, PLASMA  BRAIN NATRIURETIC PEPTIDE  HIV ANTIBODY (ROUTINE TESTING W REFLEX)  CBC  BASIC METABOLIC PANEL  TROPONIN I (HIGH SENSITIVITY)  TROPONIN I (HIGH SENSITIVITY)    EKG EKG Interpretation  Date/Time:  Sunday January 10 2022 16:19:32 EDT Ventricular Rate:  91 PR Interval:  126 QRS Duration: 90 QT Interval:  358 QTC Calculation: 440 R Axis:   107 Text Interpretation: Normal sinus rhythm with sinus arrhythmia Possible Right ventricular hypertrophy Nonspecific ST abnormality Abnormal ECG When compared with ECG of 09-Dec-2021 18:53, PREVIOUS ECG IS PRESENT No significant change since last tracing Confirmed by Isla Pence [phone]) on 01/10/2022 4:52:01 PM  Radiology CT Angio Chest PE W and/or Wo Contrast  Result Date: 01/10/2022 CLINICAL DATA:  Pulmonary embolism (PE) suspected, high prob Shortness of breath.  Decreased oxygen saturation. EXAM: CT ANGIOGRAPHY CHEST WITH CONTRAST TECHNIQUE: Multidetector CT imaging of the chest was performed using the standard protocol during bolus administration of intravenous contrast. Multiplanar CT image reconstructions and MIPs were obtained to evaluate the vascular anatomy. RADIATION DOSE REDUCTION: This exam was performed according to the departmental dose-optimization program which includes automated exposure control, adjustment of the mA and/or kV according to patient size and/or use of iterative reconstruction technique. CONTRAST:  50m OMNIPAQUE IOHEXOL 350 MG/ML SOLN COMPARISON:  Radiograph earlier today. Chest CTA 1 month ago 12/09/2021, additional prior chest CTs reviewed. FINDINGS: Cardiovascular: There are no filling defects within the pulmonary  arteries to suggest pulmonary embolus. Upper normal heart size. Atherosclerosis of the thoracic aorta. No dissection or acute aortic findings. There are coronary artery calcifications. No pericardial effusion. Mediastinum/Nodes: Prominent bilateral hilar lymph nodes, 13 mm on the right and 11 mm on the left. No significant change from prior exam. There also shotty mediastinal nodes, all subcentimeter short axis. Previous pretracheal node measures 10 mm, currently 12 mm. Decompressed esophagus. No thyroid nodule. Right axillary surgical clips. No axillary adenopathy. Lungs/Pleura: Moderate right and small to moderate left pleural effusion, increased from CT last month. There is associated compressive atelectasis. Moderate emphysema. There is minor linear atelectasis in the right middle lobe. The previous right upper lobe 5 mm nodule currently measures 4 mm, stable from 2021 exam and considered benign. There is slight smooth septal thickening which may represent pulmonary edema. Upper Abdomen: No acute findings. Musculoskeletal: Remote right rib fractures with callus. There are no acute or suspicious osseous abnormalities. Surgical clips in the right breast. Review of the MIP images confirms the above findings. IMPRESSION: 1. No pulmonary embolus. 2. Moderate right and small to moderate left pleural effusions, increased from CT last month. There is associated compressive atelectasis. 3. Slight smooth septal thickening may represent pulmonary edema. 4. Unchanged prominent bilateral hilar and mediastinal lymph nodes, likely reactive. Aortic Atherosclerosis (ICD10-I70.0) and Emphysema (ICD10-J43.9). Electronically Signed   By: MKeith RakeM.D.   On: 01/10/2022 19:31   DG Chest Portable 1 View  Result Date: 01/10/2022 CLINICAL DATA:  Shortness of breath EXAM: PORTABLE CHEST 1 VIEW COMPARISON:  11/29/2021 FINDINGS: There is hyperinflation of the lungs compatible with COPD. Small bilateral pleural effusions.  Bibasilar opacities. Heart is normal size. Aortic calcifications. No acute bony abnormality. IMPRESSION: Hyperinflation/COPD. Small bilateral effusions with bibasilar atelectasis or infiltrates. Electronically Signed   By: KRolm BaptiseM.D.   On: 01/10/2022 17:53    Procedures Procedures    Medications  Ordered in ED Medications  albuterol (PROVENTIL) (2.5 MG/3ML) 0.083% nebulizer solution (10 mg/hr Nebulization New Bag/Given 01/10/22 1742)  enoxaparin (LOVENOX) injection 40 mg (has no administration in time range)  cefTRIAXone (ROCEPHIN) 1 g in sodium chloride 0.9 % 100 mL IVPB (has no administration in time range)  methylPREDNISolone sodium succinate (SOLU-MEDROL) 40 mg/mL injection 40 mg (has no administration in time range)  mometasone-formoterol (DULERA) 200-5 MCG/ACT inhaler 2 puff (has no administration in time range)  albuterol (PROVENTIL) (2.5 MG/3ML) 0.083% nebulizer solution 2.5 mg (has no administration in time range)  umeclidinium bromide (INCRUSE ELLIPTA) 62.5 MCG/ACT 1 puff (has no administration in time range)  methylPREDNISolone sodium succinate (SOLU-MEDROL) 125 mg/2 mL injection 125 mg (125 mg Intravenous Given 01/10/22 1741)  ipratropium-albuterol (DUONEB) 0.5-2.5 (3) MG/3ML nebulizer solution 3 mL (3 mLs Nebulization Given 01/10/22 1721)  iohexol (OMNIPAQUE) 350 MG/ML injection 73 mL (73 mLs Intravenous Contrast Given 01/10/22 1906)  furosemide (LASIX) injection 40 mg (40 mg Intravenous Given 01/10/22 1954)  morphine (PF) 2 MG/ML injection 2 mg (2 mg Intravenous Given 01/10/22 1951)  ondansetron (ZOFRAN) injection 4 mg (4 mg Intravenous Given 01/10/22 1952)    ED Course/ Medical Decision Making/ A&P                           Medical Decision Making Amount and/or Complexity of Data Reviewed Labs: ordered. Radiology: ordered.  Risk Prescription drug management. Decision regarding hospitalization.   This patient presents to the ED for concern of sob, this involves an  extensive number of treatment options, and is a complaint that carries with it a high risk of complications and morbidity.  The differential diagnosis includes copd exac, chf exac, PE, covid   Co morbidities that complicate the patient evaluation  COPD, HTN, and breast cancer   Additional history obtained:  Additional history obtained from epic chart review External records from outside source obtained and reviewed including family   Lab Tests:  I Ordered, and personally interpreted labs.  The pertinent results include:  cbc nl, cmp nl, trop nl at 5; lactic nl at 1.7; bnp nl; covid neg   Imaging Studies ordered:  I ordered imaging studies including cxr and ct chest  I independently visualized and interpreted imaging which showed  CXR: IMPRESSION:  Hyperinflation/COPD.    Small bilateral effusions with bibasilar atelectasis or infiltrates.  CT chest: IMPRESSION:  1. No pulmonary embolus.  2. Moderate right and small to moderate left pleural effusions,  increased from CT last month. There is associated compressive  atelectasis.  3. Slight smooth septal thickening may represent pulmonary edema.  4. Unchanged prominent bilateral hilar and mediastinal lymph nodes,  likely reactive.    Aortic Atherosclerosis (ICD10-I70.0) and Emphysema (ICD10-J43.9).   I agree with the radiologist interpretation   Cardiac Monitoring:  The patient was maintained on a cardiac monitor.  I personally viewed and interpreted the cardiac monitored which showed an underlying rhythm of: nsr   Medicines ordered and prescription drug management:  I ordered medication including solumedrol and albuterol  for sob  Reevaluation of the patient after these medicines showed that the patient improved I have reviewed the patients home medicines and have made adjustments as needed   Test Considered:  ct   Critical Interventions:  Solumedrol/nebs   Consultations Obtained:  I requested  consultation with the hospitalists (Dr. Alcario Drought),  and discussed lab and imaging findings as well as pertinent plan - he will admit.  Problem List / ED Course:  COPD exac:  improving with nebs/solumedrol; however, O2 sat dropping to the 70s whenever pt moves.  No PE or PNA.  She does have bilateral pleural effusions. Resp failure:  pt placed on bipap Right breast redness and swelling:  may need additional imaging while here with mri   Reevaluation:  After the interventions noted above, I reevaluated the patient and found that they have :improved   Social Determinants of Health:  Lives at home   Dispostion:  After consideration of the diagnostic results and the patients response to treatment, I feel that the patent would benefit from admission.    CRITICAL CARE Performed by: Isla Pence   Total critical care time: 30 minutes  Critical care time was exclusive of separately billable procedures and treating other patients.  Critical care was necessary to treat or prevent imminent or life-threatening deterioration.  Critical care was time spent personally by me on the following activities: development of treatment plan with patient and/or surrogate as well as nursing, discussions with consultants, evaluation of patient's response to treatment, examination of patient, obtaining history from patient or surrogate, ordering and performing treatments and interventions, ordering and review of laboratory studies, ordering and review of radiographic studies, pulse oximetry and re-evaluation of patient's condition.         Final Clinical Impression(s) / ED Diagnoses Final diagnoses:  COPD exacerbation ([place])  Acute respiratory failure with hypoxia ([place])  Bilateral pleural effusion    Rx / DC Orders ED Discharge Orders     None         Isla Pence, MD 01/10/22 2034

## 2022-01-10 NOTE — ED Notes (Signed)
Triage RN notified of O2 of 82%. Pt oxygen raised to 6L per triage RN. Pt sitting comfortably.

## 2022-01-10 NOTE — Assessment & Plan Note (Signed)
On rescue BIPAP for COPD exacerbation. Tele monitor Cont pulse ox

## 2022-01-10 NOTE — Assessment & Plan Note (Addendum)
In contrast to Pulm note from 3 days ago.  She assures me she has already quit smoking, doesn't need nicotine patch.  Last cigarette was before COPD exacerbation last month.

## 2022-01-10 NOTE — ED Notes (Signed)
ED TO INPATIENT HANDOFF REPORT    S Name/Age/Gender Caroline Greer 66 y.o. female Room/Bed: 015C/015C  Code Status : Full Code  From Home ; Alert and oriented x4       Chief Complaint : SOB    Triage Note Pt here via [facility] from home for "difficulty breathing" Per pt, she also called ems at 0300 today and was given solumedrol anda duoneb and felt better, but was getting worse again throughout the day. Initial SpO2 on RA was 70s, ems gave 1 duoneb and SpO2 increased to 98%. 114/66, 90HR. Pt is on 3L O2 at baseline.   Allergies Allergies  Allergen Reactions   Crab [Shellfish Allergy] Anaphylaxis and Swelling    Dungeness crab   Erythromycin Other (See Comments)    convulsion   Hydrocodone Nausea And Vomiting    Severe vomiting     Level of Care/Admitting Diagnosis ED Disposition     ED Disposition  Admit   Condition  --   Comment  The patient appears reasonably stabilized for admission considering the current resources, flow, and capabilities available in the ED at this time, and I doubt any other [facility] requiring further screening and/or treatment in the ED prior to admission is  present.          B Medical/Surgery History Past Medical History:  Diagnosis Date   Acute respiratory failure with hypoxia ([place]) 09/30/2018   Breast cancer ([place])    Constipation    COPD exacerbation ([place]) 01/05/2019   Depression with anxiety 09/30/2018   Dyspnea    Essential hypertension 09/30/2018   History of radiation therapy    Right breast- 09/01/21-10/16/21- Dr. Gery Pray   Hypertension    Tobacco dependence due to cigarettes 09/30/2018   Past Surgical History:  Procedure Laterality Date   BREAST LUMPECTOMY WITH RADIOACTIVE SEED AND SENTINEL LYMPH NODE BIOPSY Right 07/23/2021   Procedure: RIGHT BREAST LUMPECTOMY WITH RADIOACTIVE SEED AND SENTINEL LYMPH NODE BIOPSY;  Surgeon: Jovita Kussmaul, MD;  Location: [facility];  Service: General;  Laterality: Right;   HAND SURGERY  Right 1997   Hand Fracture (right thumb & hand)   LAPAROTOMY  1974   RIGHT/LEFT HEART CATH AND CORONARY ANGIOGRAPHY N/A 10/02/2019   Procedure: RIGHT/LEFT HEART CATH AND CORONARY ANGIOGRAPHY;  Surgeon: Sherren Mocha, MD;  Location: [facility];  Service: Cardiovascular;  Laterality: N/A;     A IV Location/Drains/Wounds Patient Lines/Drains/Airways Status     Active Line/Drains/Airways     Name Placement date Placement time Site Days   Peripheral IV 01/10/22 20 G Anterior;Left Forearm 01/10/22  1724  Forearm  less than 1   Incision (Closed) 07/23/21 Breast Right 07/23/21  0945  -- 171            Intake/Output Last 24 hours No intake or output data in the 24 hours ending 01/10/22 2019  Labs/Imaging Results for orders placed or performed during the hospital encounter of 01/10/22 (from the past 48 hour(s))  CBC with Differential     Status: Abnormal   Collection Time: 01/10/22  4:15 PM  Result Value Ref Range   WBC 6.3 4.0 - 10.5 K/uL   RBC 4.51 3.87 - 5.11 MIL/uL   Hemoglobin 13.7 12.0 - 15.0 g/dL   HCT 43.4 36.0 - 46.0 %   MCV 96.2 80.0 - 100.0 fL   MCH 30.4 26.0 - 34.0 pg   MCHC 31.6 30.0 - 36.0 g/dL   RDW 14.5 11.5 - 15.5 %  Platelets 356 150 - 400 K/uL   nRBC 0.0 0.0 - 0.2 %   Neutrophils Relative % 88 %   Neutro Abs 5.5 1.7 - 7.7 K/uL   Lymphocytes Relative 10 %   Lymphs Abs 0.6 (L) 0.7 - 4.0 K/uL   Monocytes Relative 2 %   Monocytes Absolute 0.2 0.1 - 1.0 K/uL   Eosinophils Relative 0 %   Eosinophils Absolute 0.0 0.0 - 0.5 K/uL   Basophils Relative 0 %   Basophils Absolute 0.0 0.0 - 0.1 K/uL   Immature Granulocytes 0 %   Abs Immature Granulocytes 0.02 0.00 - 0.07 K/uL    Comment: Performed at [facility] [address] [place], [place] [address]  Comprehensive metabolic panel     Status: Abnormal   Collection Time: 01/10/22  4:15 PM  Result Value Ref Range   Sodium 136 135 - 145 mmol/L   Potassium 4.1 3.5 - 5.1 mmol/L   Chloride 95 (L)  98 - 111 mmol/L   CO2 31 22 - 32 mmol/L   Glucose, Bld 115 (H) 70 - 99 mg/dL    Comment: Glucose reference range applies only to samples taken after fasting for at least 8 hours.   BUN 11 8 - 23 mg/dL   Creatinine, Ser 0.48 0.44 - 1.00 mg/dL   Calcium 9.1 8.9 - 10.3 mg/dL   Total Protein 6.7 6.5 - 8.1 g/dL   Albumin 3.4 (L) 3.5 - 5.0 g/dL   AST 14 (L) 15 - 41 U/L   ALT 17 0 - 44 U/L   Alkaline Phosphatase 59 38 - 126 U/L   Total Bilirubin 0.7 0.3 - 1.2 mg/dL   GFR, Estimated >60 >60 mL/min    Comment: (NOTE) Calculated using the CKD-EPI Creatinine Equation (2021)    Anion gap 10 5 - 15    Comment: Performed at [facility], [place] [address] [place], [place] [address]  Troponin I (High Sensitivity)     Status: None   Collection Time: 01/10/22  4:15 PM  Result Value Ref Range   Troponin I (High Sensitivity) 5 <18 ng/L    Comment: (NOTE) Elevated high sensitivity troponin I (hsTnI) values and significant  changes across serial measurements may suggest ACS but many other  chronic and acute conditions are known to elevate hsTnI results.  Refer to the "Links" section for chest pain algorithms and additional  guidance. Performed at [facility], [place] [address], [place], [place] [address]   Brain natriuretic peptide     Status: None   Collection Time: 01/10/22  4:15 PM  Result Value Ref Range   B Natriuretic Peptide 64.3 0.0 - 100.0 pg/mL    Comment: Performed at [facility] [address] [place], [place] [address]  Lactic acid, plasma     Status: None   Collection Time: 01/10/22  4:35 PM  Result Value Ref Range   Lactic Acid, Venous 1.1 0.5 - 1.9 mmol/L    Comment: Performed at [facility] [address] [place], [place] [address]  SARS Coronavirus 2 by RT PCR (hospital order, performed in [facility] hospital lab) *cepheid single result test* Anterior Nasal Swab     Status: None   Collection Time: 01/10/22  5:03 PM   Specimen: Anterior Nasal Swab   Result Value Ref Range   SARS Coronavirus 2 by RT PCR NEGATIVE NEGATIVE    Comment: (NOTE) SARS-CoV-2 target nucleic acids are NOT DETECTED.  The SARS-CoV-2 RNA is generally detectable in upper and lower respiratory specimens during the acute phase of infection. The lowest concentration of SARS-CoV-2 viral copies this assay can detect is 250 copies / mL. A negative result does not preclude SARS-CoV-2 infection and should not be used as the sole basis for treatment or other patient management decisions.  A negative result may occur with improper specimen collection / handling, submission of specimen other than nasopharyngeal swab, presence of viral mutation(s) within the areas targeted by this assay, and inadequate number of viral copies (<250 copies / mL). A negative result must be combined with clinical observations, patient history, and epidemiological information.  Fact Sheet for Patients:   https://www.patel.info/  Fact Sheet for Healthcare Providers: https://hall.com/  This test is not yet approved or  cleared by the [place] FDA and has been authorized for detection and/or diagnosis of SARS-CoV-2 by FDA under an Emergency Use Authorization (EUA).  This EUA will remain in effect (meaning this test can be used) for the duration of the COVID-19 declaration under Section 564(b)(1) of the Act, 21 U.S.C. section 360bbb-3(b)(1), unless the authorization is terminated or revoked sooner.  Performed at [facility], [place] [address], [place], [place] [address]   Lactic acid, plasma     Status: None   Collection Time: 01/10/22  5:20 PM  Result Value Ref Range   Lactic Acid, Venous 1.7 0.5 - 1.9 mmol/L    Comment: Performed at [facility] [address], [place], [place] [address]   CT Angio Chest PE W and/or Wo Contrast  Result Date: 01/10/2022 CLINICAL DATA:  Pulmonary embolism (PE) suspected, high prob Shortness of  breath.  Decreased oxygen saturation. EXAM: CT ANGIOGRAPHY CHEST WITH CONTRAST TECHNIQUE: Multidetector CT imaging of the chest was performed using the standard protocol during bolus administration of intravenous contrast. Multiplanar CT image reconstructions and MIPs were obtained to evaluate the vascular anatomy. RADIATION DOSE REDUCTION: This exam was performed according to the departmental dose-optimization program which includes automated exposure control, adjustment of the mA and/or kV according to patient size and/or use of iterative reconstruction technique. CONTRAST:  64m OMNIPAQUE IOHEXOL 350 MG/ML SOLN COMPARISON:  Radiograph earlier today. Chest CTA 1 month ago 12/09/2021, additional prior chest CTs reviewed. FINDINGS: Cardiovascular: There are no filling defects within the pulmonary arteries to suggest pulmonary embolus. Upper normal heart size. Atherosclerosis of the thoracic aorta. No dissection or acute aortic findings. There are coronary artery calcifications. No pericardial effusion. Mediastinum/Nodes: Prominent bilateral hilar lymph nodes, 13 mm on the right and 11 mm on the left. No significant change from prior exam. There also shotty mediastinal nodes, all subcentimeter short axis. Previous pretracheal node measures 10 mm, currently 12 mm. Decompressed esophagus. No thyroid nodule. Right axillary surgical clips. No axillary adenopathy. Lungs/Pleura: Moderate right and small to moderate left pleural effusion, increased from CT last month. There is associated compressive atelectasis. Moderate emphysema. There is minor linear atelectasis in the right middle lobe. The previous right upper lobe 5 mm nodule currently measures 4 mm, stable from 2021 exam and considered benign. There is slight smooth septal thickening which may represent pulmonary edema. Upper Abdomen: No acute findings. Musculoskeletal: Remote right rib fractures with callus. There are no acute or suspicious osseous abnormalities.  Surgical clips in the right breast. Review of the MIP images confirms the above findings. IMPRESSION: 1. No pulmonary embolus. 2. Moderate right and small to moderate left pleural effusions, increased from CT last month. There  is associated compressive atelectasis. 3. Slight smooth septal thickening may represent pulmonary edema. 4. Unchanged prominent bilateral hilar and mediastinal lymph nodes, likely reactive. Aortic Atherosclerosis (ICD10-I70.0) and Emphysema (ICD10-J43.9). Electronically Signed   By: Keith Rake M.D.   On: 01/10/2022 19:31   DG Chest Portable 1 View  Result Date: 01/10/2022 CLINICAL DATA:  Shortness of breath EXAM: PORTABLE CHEST 1 VIEW COMPARISON:  11/29/2021 FINDINGS: There is hyperinflation of the lungs compatible with COPD. Small bilateral pleural effusions. Bibasilar opacities. Heart is normal size. Aortic calcifications. No acute bony abnormality. IMPRESSION: Hyperinflation/COPD. Small bilateral effusions with bibasilar atelectasis or infiltrates. Electronically Signed   By: Rolm Baptise M.D.   On: 01/10/2022 17:53    Pending Labs Unresulted Labs (From admission, onward)     Start     Ordered   01/10/22 2015  MRSA Next Gen by PCR, Nasal  Once,   URGENT        01/10/22 2014            Vitals/Pain Today's Vitals   01/10/22 1947 01/10/22 1957 01/10/22 1958 01/10/22 2015  BP:    108/66  Pulse: (!) 102 (!) 102 (!) 108 (!) 101  Resp: 18 (!) '22 18 19  '$ Temp:      TempSrc:      SpO2: 96%  97% 100%  Weight:      Height:      PainSc:        Isolation Precautions No active isolations  Medications Medications  albuterol (PROVENTIL) (2.5 MG/3ML) 0.083% nebulizer solution (10 mg/hr Nebulization New Bag/Given 01/10/22 1742)  methylPREDNISolone sodium succinate (SOLU-MEDROL) 125 mg/2 mL injection 125 mg (125 mg Intravenous Given 01/10/22 1741)  ipratropium-albuterol (DUONEB) 0.5-2.5 (3) MG/3ML nebulizer solution 3 mL (3 mLs Nebulization Given 01/10/22 1721)   iohexol (OMNIPAQUE) 350 MG/ML injection 73 mL (73 mLs Intravenous Contrast Given 01/10/22 1906)  furosemide (LASIX) injection 40 mg (40 mg Intravenous Given 01/10/22 1954)  morphine (PF) 2 MG/ML injection 2 mg (2 mg Intravenous Given 01/10/22 1951)  ondansetron (ZOFRAN) injection 4 mg (4 mg Intravenous Given 01/10/22 1952)    Mobility : Ambulator     R Recommendations: See Admitting Provider Note

## 2022-01-10 NOTE — ED Notes (Signed)
Lab able to add on BNP per [facility]

## 2022-01-10 NOTE — Assessment & Plan Note (Signed)
1. COPD pathway 2. Solumedrol 3. Scheduled LABA/LAMA/INH steroid 4. PRN SABA 5. Rocephin empirically (also for cellulitis)

## 2022-01-10 NOTE — ED Notes (Signed)
Pt SpO2 85% on 4L O2, pt on 3L at baseline, RN increased O2 to 6L, spO2 now 90%.

## 2022-01-11 ENCOUNTER — Other Ambulatory Visit: Payer: Self-pay | Admitting: *Deleted

## 2022-01-11 DIAGNOSIS — J9621 Acute and chronic respiratory failure with hypoxia: Secondary | ICD-10-CM | POA: Diagnosis not present

## 2022-01-11 DIAGNOSIS — Z808 Family history of malignant neoplasm of other organs or systems: Secondary | ICD-10-CM | POA: Diagnosis not present

## 2022-01-11 DIAGNOSIS — F32A Depression, unspecified: Secondary | ICD-10-CM | POA: Diagnosis present

## 2022-01-11 DIAGNOSIS — Z923 Personal history of irradiation: Secondary | ICD-10-CM | POA: Diagnosis not present

## 2022-01-11 DIAGNOSIS — F17210 Nicotine dependence, cigarettes, uncomplicated: Secondary | ICD-10-CM | POA: Diagnosis present

## 2022-01-11 DIAGNOSIS — J441 Chronic obstructive pulmonary disease with (acute) exacerbation: Secondary | ICD-10-CM

## 2022-01-11 DIAGNOSIS — Z20822 Contact with and (suspected) exposure to covid-19: Secondary | ICD-10-CM | POA: Diagnosis present

## 2022-01-11 DIAGNOSIS — F419 Anxiety disorder, unspecified: Secondary | ICD-10-CM | POA: Diagnosis present

## 2022-01-11 DIAGNOSIS — K219 Gastro-esophageal reflux disease without esophagitis: Secondary | ICD-10-CM | POA: Diagnosis present

## 2022-01-11 DIAGNOSIS — C50411 Malignant neoplasm of upper-outer quadrant of right female breast: Secondary | ICD-10-CM | POA: Diagnosis present

## 2022-01-11 DIAGNOSIS — I110 Hypertensive heart disease with heart failure: Secondary | ICD-10-CM | POA: Diagnosis present

## 2022-01-11 DIAGNOSIS — J90 Pleural effusion, not elsewhere classified: Secondary | ICD-10-CM | POA: Diagnosis not present

## 2022-01-11 DIAGNOSIS — Z170 Estrogen receptor positive status [ER+]: Secondary | ICD-10-CM | POA: Diagnosis not present

## 2022-01-11 DIAGNOSIS — Z9981 Dependence on supplemental oxygen: Secondary | ICD-10-CM | POA: Diagnosis not present

## 2022-01-11 DIAGNOSIS — Z79811 Long term (current) use of aromatase inhibitors: Secondary | ICD-10-CM | POA: Diagnosis not present

## 2022-01-11 DIAGNOSIS — I509 Heart failure, unspecified: Secondary | ICD-10-CM | POA: Diagnosis present

## 2022-01-11 DIAGNOSIS — N61 Mastitis without abscess: Secondary | ICD-10-CM | POA: Diagnosis not present

## 2022-01-11 DIAGNOSIS — Z800 Family history of malignant neoplasm of digestive organs: Secondary | ICD-10-CM | POA: Diagnosis not present

## 2022-01-11 DIAGNOSIS — Z825 Family history of asthma and other chronic lower respiratory diseases: Secondary | ICD-10-CM | POA: Diagnosis not present

## 2022-01-11 LAB — CBC
HCT: 37.9 % (ref 36.0–46.0)
Hemoglobin: 12.2 g/dL (ref 12.0–15.0)
MCH: 30.6 pg (ref 26.0–34.0)
MCHC: 32.2 g/dL (ref 30.0–36.0)
MCV: 95 fL (ref 80.0–100.0)
Platelets: 329 10*3/uL (ref 150–400)
RBC: 3.99 MIL/uL (ref 3.87–5.11)
RDW: 14.5 % (ref 11.5–15.5)
WBC: 7.9 10*3/uL (ref 4.0–10.5)
nRBC: 0 % (ref 0.0–0.2)

## 2022-01-11 LAB — BASIC METABOLIC PANEL
Anion gap: 8 (ref 5–15)
BUN: 13 mg/dL (ref 8–23)
CO2: 34 mmol/L — ABNORMAL HIGH (ref 22–32)
Calcium: 8.9 mg/dL (ref 8.9–10.3)
Chloride: 95 mmol/L — ABNORMAL LOW (ref 98–111)
Creatinine, Ser: 0.61 mg/dL (ref 0.44–1.00)
GFR, Estimated: >60 mL/min (ref >60)
Glucose, Bld: 147 mg/dL — ABNORMAL HIGH (ref 70–99)
Potassium: 4.4 mmol/L (ref 3.5–5.1)
Sodium: 137 mmol/L (ref 135–145)

## 2022-01-11 LAB — TROPONIN I (HIGH SENSITIVITY): Troponin I (High Sensitivity): 6 ng/L (ref <18)

## 2022-01-11 LAB — HIV ANTIBODY (ROUTINE TESTING W REFLEX): HIV Screen 4th Generation wRfx: NONREACTIVE

## 2022-01-11 MED ORDER — FUROSEMIDE 10 MG/ML IJ SOLN
60.0000 mg | Freq: Three times a day (TID) | INTRAMUSCULAR | Status: AC
  Administered 2022-01-11 – 2022-01-12 (×3): 60 mg via INTRAVENOUS
  Filled 2022-01-11 (×3): qty 6

## 2022-01-11 MED ORDER — GUAIFENESIN ER 600 MG PO TB12: 600.0000 mg | ORAL_TABLET | Freq: Two times a day (BID) | ORAL | Status: DC | PRN

## 2022-01-11 MED ORDER — LORATADINE 10 MG PO TABS
10.0000 mg | ORAL_TABLET | Freq: Every day | ORAL | Status: DC
  Administered 2022-01-11 – 2022-01-13 (×3): 10 mg via ORAL
  Filled 2022-01-11 (×3): qty 1

## 2022-01-11 MED ORDER — ALPRAZOLAM 0.5 MG PO TABS
0.5000 mg | ORAL_TABLET | Freq: Every evening | ORAL | Status: DC | PRN
  Administered 2022-01-12: 0.5 mg via ORAL
  Filled 2022-01-11: qty 1

## 2022-01-11 MED ORDER — EXEMESTANE 25 MG PO TABS
12.5000 mg | ORAL_TABLET | Freq: Every day | ORAL | Status: DC
  Administered 2022-01-12 – 2022-01-13 (×2): 12.5 mg via ORAL
  Filled 2022-01-11 (×2): qty 1

## 2022-01-11 MED ORDER — FLUTICASONE PROPIONATE 50 MCG/ACT NA SUSP
2.0000 | Freq: Two times a day (BID) | NASAL | Status: DC
  Administered 2022-01-11 – 2022-01-13 (×5): 2 via NASAL
  Filled 2022-01-11: qty 16

## 2022-01-11 MED ORDER — BUDESONIDE 0.25 MG/2ML IN SUSP
0.2500 mg | Freq: Two times a day (BID) | RESPIRATORY_TRACT | Status: DC
  Administered 2022-01-11 – 2022-01-13 (×5): 0.25 mg via RESPIRATORY_TRACT
  Filled 2022-01-11 (×5): qty 2

## 2022-01-11 MED ORDER — VITAMIN B-12 1000 MCG PO TABS
500.0000 ug | ORAL_TABLET | Freq: Every day | ORAL | Status: DC
  Administered 2022-01-11 – 2022-01-13 (×3): 500 ug via ORAL
  Filled 2022-01-11 (×3): qty 1

## 2022-01-11 MED ORDER — PROAIR HFA 108 (90 BASE) MCG/ACT IN AERS: 2.0000 | INHALATION_SPRAY | Freq: Four times a day (QID) | RESPIRATORY_TRACT | 3 refills | Status: DC | PRN

## 2022-01-11 MED ORDER — AMLODIPINE BESYLATE 5 MG PO TABS
5.0000 mg | ORAL_TABLET | Freq: Every day | ORAL | Status: DC
  Administered 2022-01-11 – 2022-01-13 (×3): 5 mg via ORAL
  Filled 2022-01-11 (×3): qty 1

## 2022-01-11 MED ORDER — VITAMIN D 25 MCG (1000 UNIT) PO TABS
1000.0000 [IU] | ORAL_TABLET | Freq: Every day | ORAL | Status: DC
  Administered 2022-01-11 – 2022-01-13 (×3): 1000 [IU] via ORAL
  Filled 2022-01-11: qty 3
  Filled 2022-01-11: qty 1
  Filled 2022-01-11: qty 3
  Filled 2022-01-11: qty 1

## 2022-01-11 MED ORDER — BREZTRI AEROSPHERE 160-9-4.8 MCG/ACT IN AERO: 2.0000 | INHALATION_SPRAY | Freq: Two times a day (BID) | RESPIRATORY_TRACT | 1 refills | Status: DC

## 2022-01-11 MED ORDER — POLYETHYLENE GLYCOL 3350 17 G PO PACK
17.0000 g | PACK | Freq: Every day | ORAL | Status: DC
  Filled 2022-01-11 (×3): qty 1

## 2022-01-11 MED ORDER — ACETAMINOPHEN 325 MG PO TABS: 650.0000 mg | ORAL_TABLET | Freq: Four times a day (QID) | ORAL | Status: DC | PRN

## 2022-01-11 MED ORDER — IPRATROPIUM-ALBUTEROL 0.5-2.5 (3) MG/3ML IN SOLN
3.0000 mL | Freq: Four times a day (QID) | RESPIRATORY_TRACT | Status: DC
Start: 2022-01-11 — End: 2022-01-13
  Administered 2022-01-11 – 2022-01-13 (×8): 3 mL via RESPIRATORY_TRACT
  Filled 2022-01-11 (×6): qty 3
  Filled 2022-01-11: qty 123
  Filled 2022-01-11: qty 3

## 2022-01-11 MED ORDER — PANTOPRAZOLE SODIUM 40 MG PO TBEC
40.0000 mg | DELAYED_RELEASE_TABLET | Freq: Every day | ORAL | Status: DC
  Administered 2022-01-11 – 2022-01-13 (×3): 40 mg via ORAL
  Filled 2022-01-11 (×3): qty 1

## 2022-01-11 NOTE — Evaluation (Signed)
Occupational Therapy Evaluation Patient Details Name: Caroline Greer MRN: [id number] DOB: 1955-12-01 Today's Date: 01/11/2022   History of Present Illness Pt is a 66 y/o female who presented with COPD exacerbation requiring up to 10 L HFNC and rescue BiPAP. PMH: COPD 4 L O2 at baseline, HTN, breast CA with radiation April-May.   Clinical Impression   PTA, pt lives alone, typically Independent in all ADLs, IADLs and mobility without need for AD. Pt presents now with deficits in cardiopulmonary tolerance, requiring 10 L HFNC to maintain sats at rest and noted desats during minimal activity. Pt overall Modified Independent for BSC transfers and ADLs though limited by desats during session. Collaborated on energy conservation strategies to implement in home environment. Anticipate no OT needs at DC but will follow acutely to maximize safety/endurance with daily routine. Pt also endorses need for portable O2 tank at home in order to be able to safely drive to grocery store, MD appts, etc.   SpO2 low 90s on 10 L HFNC. Desats to 78% (and questionably to 57% with good pleth though pt conversing appropriately), recovering > 3 min with pursed lip breathing.       Recommendations for follow up therapy are one component of a multi-disciplinary discharge planning process, led by the attending physician.  Recommendations may be updated based on patient status, additional functional criteria and insurance authorization.   Follow Up Recommendations  No OT follow up    Assistance Recommended at Discharge PRN  Patient can return home with the following      Functional Status Assessment  Patient has had a recent decline in their functional status and demonstrates the ability to make significant improvements in function in a reasonable and predictable amount of time.  Equipment Recommendations  None recommended by OT    Recommendations for Other Services       Precautions / Restrictions  Precautions Precautions: Other (comment) Precaution Comments: monitor O2 (4 L O2 baseline) Restrictions Weight Bearing Restrictions: No      Mobility Bed Mobility Overal bed mobility: Independent                  Transfers Overall transfer level: Modified independent Equipment used: None Transfers: Sit to/from Stand, Bed to chair/wheelchair/BSC Sit to Stand: Independent     Step pivot transfers: Modified independent (Device/Increase time)     General transfer comment: to/from [facility] without AD      Balance Overall balance assessment: No apparent balance deficits (not formally assessed)                                         ADL either performed or assessed with clinical judgement   ADL Overall ADL's : Modified independent                                       General ADL Comments: pt able to transfer to/from [facility] in ED room without AD and with assist only for lines (though pt also assisting with this). pt able to perform hygiene once needed items placed within reach of lines. Discussed energy conservation with pt implementing many strategies already     Vision Baseline Vision/History: 1 Wears glasses Ability to See in Adequate Light: 0 Adequate Patient Visual Report: No change from baseline Vision Assessment?: No apparent visual deficits  Perception     Praxis      Pertinent Vitals/Pain Pain Assessment Pain Assessment: No/denies pain     Hand Dominance Right   Extremity/Trunk Assessment Upper Extremity Assessment Upper Extremity Assessment: Overall WFL for tasks assessed   Lower Extremity Assessment Lower Extremity Assessment: Defer to PT evaluation   Cervical / Trunk Assessment Cervical / Trunk Assessment: Normal   Communication Communication Communication: No difficulties   Cognition Arousal/Alertness: Awake/alert Behavior During Therapy: WFL for tasks assessed/performed Overall Cognitive Status: Within  Functional Limits for tasks assessed                                       General Comments  Nursing present at start of session, re-alerted of desats with minimal activity and good pleth    Exercises     Shoulder Instructions      Home Living Family/patient expects to be discharged to:: Private residence Living Arrangements: Alone Available Help at Discharge: Family;Available PRN/intermittently Type of Home: House Home Access: Stairs to enter [employer] of Steps: 6 Entrance Stairs-Rails: Right;Left Home Layout: One level     Bathroom Shower/Tub: Teacher, early years/pre: Standard     Home Equipment: Shower seat          Prior Functioning/Environment Prior Level of Function : Independent/Modified Independent;Driving             Mobility Comments: no use of AD typically ADLs Comments: Independent with ADLs, IADLs, does drive but has not gotten out much d/t not having a portable O2 tank. typically tub soaks but just bought a shower chair to use just in case        OT Problem List: Cardiopulmonary status limiting activity;Decreased activity tolerance      OT Treatment/Interventions: Self-care/ADL training;Therapeutic exercise;Energy conservation;DME and/or AE instruction;Therapeutic activities    OT Goals(Current goals can be found in the care plan section) Acute Rehab OT Goals Patient Stated Goal: go home soon, have portable O2 tank to take to/from stores and MD appts OT Goal Formulation: With patient Time For Goal Achievement: 01/25/22 Potential to Achieve Goals: Good ADL Goals Pt/caregiver will Perform Home Exercise Program: Increased strength;Both right and left upper extremity;With theraband;Independently;With written HEP provided Additional ADL Goal #1: Pt to increase standing tolerance > 10 min during ADLs, IADLs and mobility without rest break Additional ADL Goal #2: Pt to verbalize at least 3 energy conservation  strategies to implement during daily tasks.  OT Frequency: Min 2X/week    Co-evaluation              AM-PAC OT "6 Clicks" Daily Activity     Outcome Measure Help from another person eating meals?: None Help from another person taking care of personal grooming?: None Help from another person toileting, which includes using toliet, bedpan, or urinal?: None Help from another person bathing (including washing, rinsing, drying)?: None Help from another person to put on and taking off regular upper body clothing?: None Help from another person to put on and taking off regular lower body clothing?: None 6 Click Score: 24   End of Session Equipment Utilized During Treatment: Oxygen Nurse Communication: Mobility status;Other (comment) (O2)  Activity Tolerance: Patient tolerated treatment well Patient left: in bed;with call bell/phone within reach;Other (comment) (provider at bedside)  OT Visit Diagnosis: Other (comment) (decreased cardiopulmonary tolerance)  Time: 9311-2162 OT Time Calculation (min): 22 min Charges:  OT General Charges $OT Visit: 1 Visit OT Evaluation $OT Eval Low Complexity: 1 Low  Malachy Chamber, OTR/L Acute Rehab Services Office: [phone]   Layla Maw 01/11/2022, 11:34 AM
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
## 2022-01-11 NOTE — Progress Notes (Signed)
Physical Therapy Evaluation Patient Details Name: Caroline Greer MRN: [id number] DOB: 07-20-1955 Today's Date: 01/11/2022  History of Present Illness  Pt is a 66 y/o female who presented with COPD exacerbation requiring up to 10 L HFNC and rescue BiPAP. PMH: COPD 4 L O2 at baseline, HTN, breast CA with radiation April-May.  Clinical Impression  Pt was assessed for initial mobility tolerance and note that while she can stand and move well, her O2 sats even on 10L are dropping to 81% with just standing at bedside.  Her husband is in attendance and is able to observe her move, and is in agreement that pt is mainly struggling with O2 values to move.  Will recommend her to get up to chair as tolerated, to work on LE strength and to monitor sats within MD set parameters for mobility.  Focus on goals of acute PT outlined below, with HHPT recommended given her limited endurance with sats for even standing.       Recommendations for follow up therapy are one component of a multi-disciplinary discharge planning process, led by the attending physician.  Recommendations may be updated based on patient status, additional functional criteria and insurance authorization.  Follow Up Recommendations Home health PT      Assistance Recommended at Discharge Intermittent Supervision/Assistance  Patient can return home with the following  A little help with bathing/dressing/bathroom;A little help with walking and/or transfers;Assistance with cooking/housework;Help with stairs or ramp for entrance    Equipment Recommendations None recommended by PT  Recommendations for Other Services       Functional Status Assessment Patient has had a recent decline in their functional status and demonstrates the ability to make significant improvements in function in a reasonable and predictable amount of time.     Precautions / Restrictions Precautions Precautions: Other (comment) Precaution Comments: monitor O2 sats, at 10  L now but 4L baseline Restrictions Weight Bearing Restrictions: No      Mobility  Bed Mobility Overal bed mobility: Modified Independent                  Transfers Overall transfer level: Modified independent                      Ambulation/Gait               General Gait Details: deferred due to desaturation with just standing  Stairs            Wheelchair Mobility    Modified Rankin (Stroke Patients Only)       Balance Overall balance assessment: No apparent balance deficits (not formally assessed)                                           Pertinent Vitals/Pain Pain Assessment Pain Assessment: No/denies pain    Home Living Family/patient expects to be discharged to:: Private residence Living Arrangements: Alone Available Help at Discharge: Family;Available PRN/intermittently Type of Home: House Home Access: Stairs to enter Entrance Stairs-Rails: Psychiatric nurse of Steps: 6   Home Layout: One level Home Equipment: Shower seat;Other (comment) (O2 concentrator) Additional Comments: asking for a portable tank for trips out    Prior Function Prior Level of Function : Independent/Modified Independent             Mobility Comments: no use of AD typically  Hand Dominance   Dominant Hand: Right    Extremity/Trunk Assessment   Upper Extremity Assessment Upper Extremity Assessment: Defer to OT evaluation    Lower Extremity Assessment Lower Extremity Assessment: Generalized weakness (mild strength losses)    Cervical / Trunk Assessment Cervical / Trunk Assessment: Normal  Communication   Communication: No difficulties  Cognition Arousal/Alertness: Awake/alert Behavior During Therapy: WFL for tasks assessed/performed Overall Cognitive Status: Within Functional Limits for tasks assessed                                          General Comments General comments  (skin integrity, edema, etc.): pt is dropping to 81% with standing bedside and is apparently not as concerned, asked to wait and stand longer but asked her to sit for management of O2 values    Exercises     Assessment/Plan    PT Assessment Patient needs continued PT services  PT Problem List Cardiopulmonary status limiting activity;Decreased strength       PT Treatment Interventions DME instruction;Gait training;Stair training;Therapeutic activities;Functional mobility training;Therapeutic exercise;Neuromuscular re-education;Balance training;Patient/family education    PT Goals (Current goals can be found in the Care Plan section)  Acute Rehab PT Goals Patient Stated Goal: to go home and manage O2 with a tiny portable tank PT Goal Formulation: With patient/family Time For Goal Achievement: 01/25/22 Potential to Achieve Goals: Good    Frequency Min 3X/week     Co-evaluation               AM-PAC PT "6 Clicks" Mobility  Outcome Measure Help needed turning from your back to your side while in a flat bed without using bedrails?: None Help needed moving from lying on your back to sitting on the side of a flat bed without using bedrails?: A Little Help needed moving to and from a bed to a chair (including a wheelchair)?: A Little Help needed standing up from a chair using your arms (e.g., wheelchair or bedside chair)?: A Little Help needed to walk in hospital room?: A Little Help needed climbing 3-5 steps with a railing? : A Little 6 Click Score: 19    End of Session Equipment Utilized During Treatment: Oxygen Activity Tolerance: Patient limited by fatigue;Treatment limited secondary to medical complications (Comment) Patient left: in bed;with call bell/phone within reach;with family/visitor present Nurse Communication: Mobility status PT Visit Diagnosis: Muscle weakness (generalized) (M62.81);Difficulty in walking, not elsewhere classified (R26.2)    Time: 8592-9244 PT  Time Calculation (min) (ACUTE ONLY): 29 min   Charges:   PT Evaluation $PT Eval Moderate Complexity: 1 Mod PT Treatments $Therapeutic Activity: 8-22 mins       Ramond Dial 01/11/2022, 3:56 PM  Mee Hives, PT PhD Acute Rehab Dept. Number: [place] [facility] [place]

## 2022-01-11 NOTE — ED Notes (Signed)
OT at bedside assessing pt at this time.

## 2022-01-11 NOTE — Progress Notes (Addendum)
PROGRESS NOTE        PATIENT DETAILS Name: Caroline Greer Age: 66 y.o. Sex: female Date of Birth: 07-Sep-1955 Admit Date: 01/10/2022 Admitting Physician Evalee Mutton Kristeen Mans, MD [id number], [name], MD  Brief Summary: Patient is a 66 y.o.  female with history of COPD, chronic hypoxic respiratory failure-on 3-4 L of oxygen at home, history of breast CA-s/p lumpectomy and adjuvant radiation therapy-now on Aromasin-presented to the hospital with worsening shortness of breath-she was found to have acute hypoxic respiratory failure requiring BiPAP due to COPD exacerbation.  Significant events: 8/20>> worsening hypoxia-COPD exacerbation-started on BiPAP. 8/21>> liberated off BiPAP earlier this morning  Significant studies: 7/25>> Echo: EF 83-41%, grade 1 diastolic dysfunction. 8/20>> CT angio chest: No PE, moderate right/left pleural effusion.  Unchanged prominent bilateral hilar/mediastinal lymph nodes-likely reactive.  Significant microbiology data: 8/20>> COVID PCR: Negative  Procedures: None  Consults: None  Subjective: Feels much better-no longer on BiPAP-on around 4 L of [place].   Objective: Vitals: Blood pressure 119/72, pulse 84, temperature 99.4 F (37.4 C), temperature source Oral, resp. rate 17, height '5\' 10"'$  (1.778 m), weight 63.5 kg, SpO2 95 %.   Exam: Gen Exam:Alert awake-not in any distress HEENT:atraumatic, normocephalic Chest: Diminished air entry at bases-some scattered wheezing. CVS:S1S2 regular Abdomen:soft non tender, non distended Extremities:no edema Neurology: Non focal Skin: no rash  Pertinent Labs/Radiology:    Latest Ref Rng & Units 01/11/2022    3:20 AM 01/10/2022    4:15 PM 12/15/2021    1:53 AM  CBC  WBC 4.0 - 10.5 K/uL 7.9  6.3  11.1   Hemoglobin 12.0 - 15.0 g/dL 12.2  13.7  15.8   Hematocrit 36.0 - 46.0 % 37.9  43.4  48.5   Platelets 150 - 400 K/uL 329  356  253     Lab Results  Component Value Date   NA 137 01/11/2022    K 4.4 01/11/2022   CL 95 (L) 01/11/2022   CO2 34 (H) 01/11/2022      Assessment/Plan: Acute on chronic hypoxic respiratory failure (home O2 3-4 L) due to COPD exacerbation: Improved-liberated off BiPAP last night-on 4 L of nasal cannula (on 3-4 L at home).  Some wheezing but moving air well-continue steroids/bronchodilators/empiric antibiotics.  Bilateral pleural effusion: Given history of malignancy-May need thoracocentesis-we will discuss with the pulmonology team.  No evidence of volume overload/CHF-recent echo stable.  Right breast soft tissue infection: Do not appreciate any significant erythema today-continue IV Rocephin-Per patient she feels that erythema/swelling is already better.  Admitting MD has already been notified Dr. [name] (primary oncologist) of this finding-he plans to follow-up in the outpatient setting after patient finishes a course of antimicrobial therapy.  5 mm right upper lobe nodule-unchanged from 2021: Likely benign.  Malignant neoplasm of upper-outer quadrant of right breast in female, estrogen receptor positive: S/p lumpectomy and radiation-currently on Aromasin.  Followed by Dr. Sharion Settler above regarding possible right breast cellulitis-and plans for close outpatient follow-up with oncology after patient completes a course of antimicrobial therapy  HTN: BP slowly creeping up-resume amlodipine at reduced dose-continue to hold metoprolol for now.  GERD: Continue PPI  Tobacco abuse: Counseled.  BMI: Estimated body mass index is 20.09 kg/m as calculated from the following:   Height as of this encounter: '5\' 10"'$  (1.778 m).   Weight as of this encounter: 63.5 kg.   Code  status:   Code Status: Full Code   DVT Prophylaxis: enoxaparin (LOVENOX) injection 40 mg Start: 01/11/22 1000   Family Communication: None at bedside   Disposition Plan: Status is: Inpatient Remains inpatient appropriate because: Acute on chronic hypoxic respiratory failure-BiPAP on  arrival-COPD exacerbation although improved-not yet at baseline-noted stable for discharge.   Planned Discharge Destination:Home likely in the next 1-2 days.   Diet: Diet Order             Diet Heart Room service appropriate? Yes; Fluid consistency: Thin  Diet effective now                     Antimicrobial agents: Anti-infectives (From admission, onward)    Start     Dose/Rate Route Frequency Ordered Stop   01/10/22 2030  cefTRIAXone (ROCEPHIN) 1 g in sodium chloride 0.9 % 100 mL IVPB        1 g 200 mL/hr over 30 Minutes Intravenous Every 24 hours 01/10/22 2028 01/15/22 2029        MEDICATIONS: Scheduled Meds:  budesonide (PULMICORT) nebulizer solution  0.25 mg Nebulization BID   enoxaparin (LOVENOX) injection  40 mg Subcutaneous Q24H   fluticasone  2 spray Each Nare BID   ipratropium-albuterol  3 mL Nebulization Q6H   methylPREDNISolone (SOLU-MEDROL) injection  40 mg Intravenous Q12H   Continuous Infusions:  cefTRIAXone (ROCEPHIN)  IV Stopped (01/10/22 2118)   PRN Meds:.albuterol   I have personally reviewed following labs and imaging studies  LABORATORY DATA: CBC: Recent Labs  Lab 01/10/22 1615 01/11/22 0320  WBC 6.3 7.9  NEUTROABS 5.5  --   HGB 13.7 12.2  HCT 43.4 37.9  MCV 96.2 95.0  PLT 356 062    Basic Metabolic Panel: Recent Labs  Lab 01/10/22 1615 01/11/22 0320  NA 136 137  K 4.1 4.4  CL 95* 95*  CO2 31 34*  GLUCOSE 115* 147*  BUN 11 13  CREATININE 0.48 0.61  CALCIUM 9.1 8.9    GFR: Estimated Creatinine Clearance: 67.5 mL/min (by C-G formula based on SCr of 0.61 mg/dL).  Liver Function Tests: Recent Labs  Lab 01/10/22 1615  AST 14*  ALT 17  ALKPHOS 59  BILITOT 0.7  PROT 6.7  ALBUMIN 3.4*   No results for input(s): "LIPASE", "AMYLASE" in the last 168 hours. No results for input(s): "AMMONIA" in the last 168 hours.  Coagulation Profile: No results for input(s): "INR", "PROTIME" in the last 168 hours.  Cardiac  Enzymes: No results for input(s): "CKTOTAL", "CKMB", "CKMBINDEX", "TROPONINI" in the last 168 hours.  BNP (last 3 results) No results for input(s): "PROBNP" in the last 8760 hours.  Lipid Profile: No results for input(s): "CHOL", "HDL", "LDLCALC", "TRIG", "CHOLHDL", "LDLDIRECT" in the last 72 hours.  Thyroid Function Tests: No results for input(s): "TSH", "T4TOTAL", "FREET4", "T3FREE", "THYROIDAB" in the last 72 hours.  Anemia Panel: No results for input(s): "VITAMINB12", "FOLATE", "FERRITIN", "TIBC", "IRON", "RETICCTPCT" in the last 72 hours.  Urine analysis:    Component Value Date/Time   COLORURINE YELLOW 12/24/2007 1833   APPEARANCEUR CLEAR 12/24/2007 1833   LABSPEC 1.008 12/24/2007 1833   PHURINE 6.0 12/24/2007 1833   GLUCOSEU NEGATIVE 12/24/2007 1833   HGBUR NEGATIVE 12/24/2007 1833   BILIRUBINUR NEGATIVE 12/24/2007 1833   KETONESUR NEGATIVE 12/24/2007 1833   PROTEINUR NEGATIVE 12/24/2007 1833   UROBILINOGEN 0.2 12/24/2007 1833   NITRITE NEGATIVE 12/24/2007 1833   LEUKOCYTESUR MODERATE (A) 12/24/2007 1833    Sepsis Labs: Lactic Acid,  Venous    Component Value Date/Time   LATICACIDVEN 1.7 01/10/2022 1720    MICROBIOLOGY: Recent Results (from the past 240 hour(s))  SARS Coronavirus 2 by RT PCR (hospital order, performed in [facility] hospital lab) *cepheid single result test* Anterior Nasal Swab     Status: None   Collection Time: 01/10/22  5:03 PM   Specimen: Anterior Nasal Swab  Result Value Ref Range Status   SARS Coronavirus 2 by RT PCR NEGATIVE NEGATIVE Final    Comment: (NOTE) SARS-CoV-2 target nucleic acids are NOT DETECTED.  The SARS-CoV-2 RNA is generally detectable in upper and lower respiratory specimens during the acute phase of infection. The lowest concentration of SARS-CoV-2 viral copies this assay can detect is 250 copies / mL. A negative result does not preclude SARS-CoV-2 infection and should not be used as the sole basis for treatment or  other patient management decisions.  A negative result may occur with improper specimen collection / handling, submission of specimen other than nasopharyngeal swab, presence of viral mutation(s) within the areas targeted by this assay, and inadequate number of viral copies (<250 copies / mL). A negative result must be combined with clinical observations, patient history, and epidemiological information.  Fact Sheet for Patients:   https://www.patel.info/  Fact Sheet for Healthcare Providers: https://hall.com/  This test is not yet approved or  cleared by the [place] FDA and has been authorized for detection and/or diagnosis of SARS-CoV-2 by FDA under an Emergency Use Authorization (EUA).  This EUA will remain in effect (meaning this test can be used) for the duration of the COVID-19 declaration under Section 564(b)(1) of the Act, 21 U.S.C. section 360bbb-3(b)(1), unless the authorization is terminated or revoked sooner.  Performed at [facility], [place] [address], [place], [place] [address]   MRSA Next Gen by PCR, Nasal     Status: None   Collection Time: 01/10/22  8:45 PM   Specimen: Nasal Mucosa; Nasal Swab  Result Value Ref Range Status   MRSA by PCR Next Gen NOT DETECTED NOT DETECTED Final    Comment: (NOTE) The GeneXpert MRSA Assay (FDA approved for NASAL specimens only), is one component of a comprehensive MRSA colonization surveillance program. It is not intended to diagnose MRSA infection nor to guide or monitor treatment for MRSA infections. Test performance is not FDA approved in patients less than 82 years old. Performed at [facility], [place] [address] [place],  [address]     RADIOLOGY STUDIES/RESULTS: CT Angio Chest PE W and/or Wo Contrast  Result Date: 01/10/2022 CLINICAL DATA:  Pulmonary embolism (PE) suspected, high prob Shortness of breath.  Decreased oxygen saturation. EXAM: CT ANGIOGRAPHY  CHEST WITH CONTRAST TECHNIQUE: Multidetector CT imaging of the chest was performed using the standard protocol during bolus administration of intravenous contrast. Multiplanar CT image reconstructions and MIPs were obtained to evaluate the vascular anatomy. RADIATION DOSE REDUCTION: This exam was performed according to the departmental dose-optimization program which includes automated exposure control, adjustment of the mA and/or kV according to patient size and/or use of iterative reconstruction technique. CONTRAST:  50m OMNIPAQUE IOHEXOL 350 MG/ML SOLN COMPARISON:  Radiograph earlier today. Chest CTA 1 month ago 12/09/2021, additional prior chest CTs reviewed. FINDINGS: Cardiovascular: There are no filling defects within the pulmonary arteries to suggest pulmonary embolus. Upper normal heart size. Atherosclerosis of the thoracic aorta. No dissection or acute aortic findings. There are coronary artery calcifications. No pericardial effusion. Mediastinum/Nodes: Prominent bilateral hilar lymph nodes, 13  mm on the right and 11 mm on the left. No significant change from prior exam. There also shotty mediastinal nodes, all subcentimeter short axis. Previous pretracheal node measures 10 mm, currently 12 mm. Decompressed esophagus. No thyroid nodule. Right axillary surgical clips. No axillary adenopathy. Lungs/Pleura: Moderate right and small to moderate left pleural effusion, increased from CT last month. There is associated compressive atelectasis. Moderate emphysema. There is minor linear atelectasis in the right middle lobe. The previous right upper lobe 5 mm nodule currently measures 4 mm, stable from 2021 exam and considered benign. There is slight smooth septal thickening which may represent pulmonary edema. Upper Abdomen: No acute findings. Musculoskeletal: Remote right rib fractures with callus. There are no acute or suspicious osseous abnormalities. Surgical clips in the right breast. Review of the MIP images  confirms the above findings. IMPRESSION: 1. No pulmonary embolus. 2. Moderate right and small to moderate left pleural effusions, increased from CT last month. There is associated compressive atelectasis. 3. Slight smooth septal thickening may represent pulmonary edema. 4. Unchanged prominent bilateral hilar and mediastinal lymph nodes, likely reactive. Aortic Atherosclerosis (ICD10-I70.0) and Emphysema (ICD10-J43.9). Electronically Signed   By: Keith Rake M.D.   On: 01/10/2022 19:31   DG Chest Portable 1 View  Result Date: 01/10/2022 CLINICAL DATA:  Shortness of breath EXAM: PORTABLE CHEST 1 VIEW COMPARISON:  11/29/2021 FINDINGS: There is hyperinflation of the lungs compatible with COPD. Small bilateral pleural effusions. Bibasilar opacities. Heart is normal size. Aortic calcifications. No acute bony abnormality. IMPRESSION: Hyperinflation/COPD. Small bilateral effusions with bibasilar atelectasis or infiltrates. Electronically Signed   By: Rolm Baptise M.D.   On: 01/10/2022 17:53     LOS: 0 days   Oren Binet, MD  [facility]    To contact the attending provider between 7A-7P or the covering provider during after hours 7P-7A, please log into the web site [email or web address] and access using universal [place] password for that web site. If you do not have the password, please call the hospital operator.  01/11/2022, 11:00 AM

## 2022-01-12 ENCOUNTER — Inpatient Hospital Stay (HOSPITAL_COMMUNITY): Payer: Medicare Other

## 2022-01-12 ENCOUNTER — Encounter (HOSPITAL_COMMUNITY)
Admission: EM | Disposition: A | Discharge status: Home Health | Payer: Self-pay | Source: Home / Self Care | Attending: Internal Medicine

## 2022-01-12 ENCOUNTER — Ambulatory Visit (HOSPITAL_COMMUNITY): Admit: 2022-01-12 | Payer: Medicare Other | Admitting: Pulmonary Disease

## 2022-01-12 ENCOUNTER — Encounter (HOSPITAL_COMMUNITY): Payer: Self-pay | Admitting: Internal Medicine

## 2022-01-12 DIAGNOSIS — J441 Chronic obstructive pulmonary disease with (acute) exacerbation: Secondary | ICD-10-CM | POA: Diagnosis not present

## 2022-01-12 DIAGNOSIS — J9621 Acute and chronic respiratory failure with hypoxia: Secondary | ICD-10-CM | POA: Diagnosis not present

## 2022-01-12 DIAGNOSIS — J90 Pleural effusion, not elsewhere classified: Secondary | ICD-10-CM | POA: Diagnosis not present

## 2022-01-12 DIAGNOSIS — N61 Mastitis without abscess: Secondary | ICD-10-CM | POA: Diagnosis not present

## 2022-01-12 HISTORY — PX: THORACENTESIS: SHX235

## 2022-01-12 LAB — LACTATE DEHYDROGENASE: LDH: 160 U/L (ref 98–192)

## 2022-01-12 LAB — PROTEIN, TOTAL: Total Protein: 6.7 g/dL (ref 6.5–8.1)

## 2022-01-12 SURGERY — THORACENTESIS
Anesthesia: LOCAL

## 2022-01-12 MED ORDER — SORBITOL 70 % SOLN
30.0000 mL | Freq: Once | Status: AC
  Administered 2022-01-12: 30 mL via ORAL
  Filled 2022-01-12: qty 30

## 2022-01-12 MED ORDER — SORBITOL 70 % SOLN
30.0000 mL | Freq: Every day | Status: DC | PRN
  Filled 2022-01-12: qty 30

## 2022-01-12 NOTE — Progress Notes (Signed)
PROGRESS NOTE        PATIENT DETAILS Name: Caroline Greer Age: 66 y.o. Sex: female Date of Birth: 1955/08/11 Admit Date: 01/10/2022 Admitting Physician Evalee Mutton Kristeen Mans, MD QKM:[id number], [name], MD  Brief Summary: Patient is a 66 y.o.  female with history of COPD, chronic hypoxic respiratory failure-on 3-4 L of oxygen at home, history of breast CA-s/p lumpectomy and adjuvant radiation therapy-now on Aromasin-presented to the hospital with worsening shortness of breath-she was found to have acute hypoxic respiratory failure requiring BiPAP due to COPD exacerbation.  Significant events: 8/20>> worsening hypoxia-COPD exacerbation-started on BiPAP. 8/21>> liberated off BiPAP earlier this morning  Significant studies: 7/25>> Echo: EF 16-57%, grade 1 diastolic dysfunction. 8/20>> CT angio chest: No PE, moderate right/left pleural effusion.  Unchanged prominent bilateral hilar/mediastinal lymph nodes-likely reactive.  Significant microbiology data: 8/20>> COVID PCR: Negative  Procedures: None  Consults: None  Subjective: Requiring around 7 L of oxygen to maintain O2 saturations.  Overall feels better than how she first presented to the hospital.  Objective: Vitals: Blood pressure 110/74, pulse 93, temperature 98.8 F (37.1 C), temperature source Oral, resp. rate 20, height '5\' 10"'$  (1.778 m), weight 63.5 kg, SpO2 (!) 88 %.   Exam: Gen Exam:Alert awake-not in any distress HEENT:atraumatic, normocephalic Chest: Scattered wheezing-diminished air entry bilaterally. CVS:S1S2 regular Abdomen:soft non tender, non distended Extremities:no edema Neurology: Non focal Skin: no rash   Pertinent Labs/Radiology:    Latest Ref Rng & Units 01/11/2022    3:20 AM 01/10/2022    4:15 PM 12/15/2021    1:53 AM  CBC  WBC 4.0 - 10.5 K/uL 7.9  6.3  11.1   Hemoglobin 12.0 - 15.0 g/dL 12.2  13.7  15.8   Hematocrit 36.0 - 46.0 % 37.9  43.4  48.5   Platelets 150 - 400 K/uL 329   356  253     Lab Results  Component Value Date   NA 137 01/11/2022   K 4.4 01/11/2022   CL 95 (L) 01/11/2022   CO2 34 (H) 01/11/2022      Assessment/Plan: Acute on chronic hypoxic respiratory failure (home O2 3-4 L) due to COPD exacerbation: Slowly improving but still more hypoxic than her usual baseline requiring on 7 L of oxygen (3-4 L at home).  Continue steroids/bronchodilators.  Mobilize with PT/nursing staff.  Bilateral pleural effusion: Given history of malignancy-PCCM consulted for possible diagnostic thoracocentesis.  No evidence of volume overload-recent echo stable.  .  Right breast soft tissue infection: Breast erythema resolved-continue IV Rocephin.  .  Admitting MD has already been notified Dr. [name] (primary oncologist) of this finding-he plans to follow-up in the outpatient setting after patient finishes a course of antimicrobial therapy.  5 mm right upper lobe nodule-unchanged from 2021: Likely benign.  Malignant neoplasm of upper-outer quadrant of right breast in female, estrogen receptor positive: S/p lumpectomy and radiation-currently on Aromasin.  Followed by Dr. Sharion Settler above regarding possible right breast cellulitis-and plans for close outpatient follow-up with oncology after patient completes a course of antimicrobial therapy  HTN: BP stable on amlodipine-continue to hold metoprolol.  GERD: Continue PPI  Tobacco abuse: Counseled.  BMI: Estimated body mass index is 20.09 kg/m as calculated from the following:   Height as of this encounter: '5\' 10"'$  (1.778 m).   Weight as of this encounter: 63.5 kg.   Code status:  Code Status: Full Code   DVT Prophylaxis: enoxaparin (LOVENOX) injection 40 mg Start: 01/11/22 1000   Family Communication: None at bedside   Disposition Plan: Status is: Inpatient Remains inpatient appropriate because: Acute on chronic hypoxic respiratory failure-BiPAP on arrival-COPD exacerbation although improved-not yet at  baseline-noted stable for discharge.   Planned Discharge Destination:Home likely in the next 1-2 days.   Diet: Diet Order             Diet Heart Room service appropriate? Yes; Fluid consistency: Thin  Diet effective now                     Antimicrobial agents: Anti-infectives (From admission, onward)    Start     Dose/Rate Route Frequency Ordered Stop   01/10/22 2030  cefTRIAXone (ROCEPHIN) 1 g in sodium chloride 0.9 % 100 mL IVPB        1 g 200 mL/hr over 30 Minutes Intravenous Every 24 hours 01/10/22 2028 01/15/22 2029        MEDICATIONS: Scheduled Meds:  amLODipine  5 mg Oral Daily   budesonide (PULMICORT) nebulizer solution  0.25 mg Nebulization BID   cholecalciferol  1,000 Units Oral Daily   cyanocobalamin  500 mcg Oral Daily   enoxaparin (LOVENOX) injection  40 mg Subcutaneous Q24H   exemestane  12.5 mg Oral QPC breakfast   fluticasone  2 spray Each Nare BID   ipratropium-albuterol  3 mL Nebulization Q6H   loratadine  10 mg Oral Daily   methylPREDNISolone (SOLU-MEDROL) injection  40 mg Intravenous Q12H   pantoprazole  40 mg Oral Q0600   polyethylene glycol  17 g Oral Daily   Continuous Infusions:  cefTRIAXone (ROCEPHIN)  IV 1 g (01/11/22 2150)   PRN Meds:.acetaminophen, albuterol, ALPRAZolam, guaiFENesin, sorbitol   I have personally reviewed following labs and imaging studies  LABORATORY DATA: CBC: Recent Labs  Lab 01/10/22 1615 01/11/22 0320  WBC 6.3 7.9  NEUTROABS 5.5  --   HGB 13.7 12.2  HCT 43.4 37.9  MCV 96.2 95.0  PLT 356 329     Basic Metabolic Panel: Recent Labs  Lab 01/10/22 1615 01/11/22 0320  NA 136 137  K 4.1 4.4  CL 95* 95*  CO2 31 34*  GLUCOSE 115* 147*  BUN 11 13  CREATININE 0.48 0.61  CALCIUM 9.1 8.9     GFR: Estimated Creatinine Clearance: 67.5 mL/min (by C-G formula based on SCr of 0.61 mg/dL).  Liver Function Tests: Recent Labs  Lab 01/10/22 1615  AST 14*  ALT 17  ALKPHOS 59  BILITOT 0.7  PROT  6.7  ALBUMIN 3.4*    No results for input(s): "LIPASE", "AMYLASE" in the last 168 hours. No results for input(s): "AMMONIA" in the last 168 hours.  Coagulation Profile: No results for input(s): "INR", "PROTIME" in the last 168 hours.  Cardiac Enzymes: No results for input(s): "CKTOTAL", "CKMB", "CKMBINDEX", "TROPONINI" in the last 168 hours.  BNP (last 3 results) No results for input(s): "PROBNP" in the last 8760 hours.  Lipid Profile: No results for input(s): "CHOL", "HDL", "LDLCALC", "TRIG", "CHOLHDL", "LDLDIRECT" in the last 72 hours.  Thyroid Function Tests: No results for input(s): "TSH", "T4TOTAL", "FREET4", "T3FREE", "THYROIDAB" in the last 72 hours.  Anemia Panel: No results for input(s): "VITAMINB12", "FOLATE", "FERRITIN", "TIBC", "IRON", "RETICCTPCT" in the last 72 hours.  Urine analysis:    Component Value Date/Time   COLORURINE YELLOW 12/24/2007 1833   APPEARANCEUR CLEAR 12/24/2007 1833   LABSPEC 1.008 12/24/2007  [place] 6.0 12/24/2007 1833   GLUCOSEU NEGATIVE 12/24/2007 1833   HGBUR NEGATIVE 12/24/2007 1833   BILIRUBINUR NEGATIVE 12/24/2007 1833   KETONESUR NEGATIVE 12/24/2007 1833   PROTEINUR NEGATIVE 12/24/2007 1833   UROBILINOGEN 0.2 12/24/2007 1833   NITRITE NEGATIVE 12/24/2007 1833   LEUKOCYTESUR MODERATE (A) 12/24/2007 1833    Sepsis Labs: Lactic Acid, Venous    Component Value Date/Time   LATICACIDVEN 1.7 01/10/2022 1720    MICROBIOLOGY: Recent Results (from the past 240 hour(s))  SARS Coronavirus 2 by RT PCR (hospital order, performed in [facility] hospital lab) *cepheid single result test* Anterior Nasal Swab     Status: None   Collection Time: 01/10/22  5:03 PM   Specimen: Anterior Nasal Swab  Result Value Ref Range Status   SARS Coronavirus 2 by RT PCR NEGATIVE NEGATIVE Final    Comment: (NOTE) SARS-CoV-2 target nucleic acids are NOT DETECTED.  The SARS-CoV-2 RNA is generally detectable in upper and lower respiratory  specimens during the acute phase of infection. The lowest concentration of SARS-CoV-2 viral copies this assay can detect is 250 copies / mL. A negative result does not preclude SARS-CoV-2 infection and should not be used as the sole basis for treatment or other patient management decisions.  A negative result may occur with improper specimen collection / handling, submission of specimen other than nasopharyngeal swab, presence of viral mutation(s) within the areas targeted by this assay, and inadequate number of viral copies (<250 copies / mL). A negative result must be combined with clinical observations, patient history, and epidemiological information.  Fact Sheet for Patients:   https://www.patel.info/  Fact Sheet for Healthcare Providers: https://hall.com/  This test is not yet approved or  cleared by the [place] FDA and has been authorized for detection and/or diagnosis of SARS-CoV-2 by FDA under an Emergency Use Authorization (EUA).  This EUA will remain in effect (meaning this test can be used) for the duration of the COVID-19 declaration under Section 564(b)(1) of the Act, 21 U.S.C. section 360bbb-3(b)(1), unless the authorization is terminated or revoked sooner.  Performed at [facility], [place] [address] [place], [place] [address]   MRSA Next Gen by PCR, Nasal     Status: None   Collection Time: 01/10/22  8:45 PM   Specimen: Nasal Mucosa; Nasal Swab  Result Value Ref Range Status   MRSA by PCR Next Gen NOT DETECTED NOT DETECTED Final    Comment: (NOTE) The GeneXpert MRSA Assay (FDA approved for NASAL specimens only), is one component of a comprehensive MRSA colonization surveillance program. It is not intended to diagnose MRSA infection nor to guide or monitor treatment for MRSA infections. Test performance is not FDA approved in patients less than 77 years old. Performed at [facility], [place] [address] [place], [place] [address]     RADIOLOGY STUDIES/RESULTS: DG Chest Port 1 View  Result Date: 01/12/2022 CLINICAL DATA:  [id number].  Pleural effusion follow-up. EXAM: PORTABLE CHEST 1 VIEW COMPARISON:  Chest CT with contrast August 20. FINDINGS: 4:47 a.m. Small right-greater-than-left pleural effusions are again noted, with asymmetric opacity overlying the right effusion which could be atelectasis or pneumonia. Remaining lungs are clear with COPD change. The cardiac size is normal. The mediastinum is normally outlined. There is aortic atherosclerosis. Old right axillary surgical clips.  There is osteopenia. IMPRESSION: The overall aeration is unchanged. Small right-greater-than-left pleural effusions and overlying opacities of the right base show no improvement or worsening. The  cardiac size is normal. Electronically Signed   By: Telford Nab M.D.   On: 01/12/2022 07:36   CT Angio Chest PE W and/or Wo Contrast  Result Date: 01/10/2022 CLINICAL DATA:  Pulmonary embolism (PE) suspected, high prob Shortness of breath.  Decreased oxygen saturation. EXAM: CT ANGIOGRAPHY CHEST WITH CONTRAST TECHNIQUE: Multidetector CT imaging of the chest was performed using the standard protocol during bolus administration of intravenous contrast. Multiplanar CT image reconstructions and MIPs were obtained to evaluate the vascular anatomy. RADIATION DOSE REDUCTION: This exam was performed according to the departmental dose-optimization program which includes automated exposure control, adjustment of the mA and/or kV according to patient size and/or use of iterative reconstruction technique. CONTRAST:  10m OMNIPAQUE IOHEXOL 350 MG/ML SOLN COMPARISON:  Radiograph earlier today. Chest CTA 1 month ago 12/09/2021, additional prior chest CTs reviewed. FINDINGS: Cardiovascular: There are no filling defects within the pulmonary arteries to suggest pulmonary embolus. Upper normal heart size. Atherosclerosis of the thoracic aorta. No  dissection or acute aortic findings. There are coronary artery calcifications. No pericardial effusion. Mediastinum/Nodes: Prominent bilateral hilar lymph nodes, 13 mm on the right and 11 mm on the left. No significant change from prior exam. There also shotty mediastinal nodes, all subcentimeter short axis. Previous pretracheal node measures 10 mm, currently 12 mm. Decompressed esophagus. No thyroid nodule. Right axillary surgical clips. No axillary adenopathy. Lungs/Pleura: Moderate right and small to moderate left pleural effusion, increased from CT last month. There is associated compressive atelectasis. Moderate emphysema. There is minor linear atelectasis in the right middle lobe. The previous right upper lobe 5 mm nodule currently measures 4 mm, stable from 2021 exam and considered benign. There is slight smooth septal thickening which may represent pulmonary edema. Upper Abdomen: No acute findings. Musculoskeletal: Remote right rib fractures with callus. There are no acute or suspicious osseous abnormalities. Surgical clips in the right breast. Review of the MIP images confirms the above findings. IMPRESSION: 1. No pulmonary embolus. 2. Moderate right and small to moderate left pleural effusions, increased from CT last month. There is associated compressive atelectasis. 3. Slight smooth septal thickening may represent pulmonary edema. 4. Unchanged prominent bilateral hilar and mediastinal lymph nodes, likely reactive. Aortic Atherosclerosis (ICD10-I70.0) and Emphysema (ICD10-J43.9). Electronically Signed   By: MKeith RakeM.D.   On: 01/10/2022 19:31   DG Chest Portable 1 View  Result Date: 01/10/2022 CLINICAL DATA:  Shortness of breath EXAM: PORTABLE CHEST 1 VIEW COMPARISON:  11/29/2021 FINDINGS: There is hyperinflation of the lungs compatible with COPD. Small bilateral pleural effusions. Bibasilar opacities. Heart is normal size. Aortic calcifications. No acute bony abnormality. IMPRESSION:  Hyperinflation/COPD. Small bilateral effusions with bibasilar atelectasis or infiltrates. Electronically Signed   By: KRolm BaptiseM.D.   On: 01/10/2022 17:53     LOS: 1 day   SOren Binet MD  [facility]    To contact the attending provider between 7A-7P or the covering provider during after hours 7P-7A, please log into the web site [email or web address] and access using universal Lane password for that web site. If you do not have the password, please call the hospital operator.  01/12/2022, 11:10 AM

## 2022-01-12 NOTE — Op Note (Signed)
Thoracentesis  Procedure Note  Taeya Theall  [id number]  13-Oct-1955  Date:01/12/22  Time:1:54 PM   Provider Performing:Brent Allannah Kempen   Procedure: Thoracentesis with imaging guidance (21117)  Indication(s) Pleural Effusion  Consent Risks of the procedure as well as the alternatives and risks of each were explained to the patient and/or caregiver.  Consent for the procedure was obtained and is signed in the bedside chart  Anesthesia Topical only with 1% lidocaine    Time Out Verified patient identification, verified procedure, site/side was marked, verified correct patient position, special equipment/implants available, medications/allergies/relevant history reviewed, required imaging and test results available.   Sterile Technique Maximal sterile technique including full sterile barrier drape, hand hygiene, sterile gown, sterile gloves, mask, hair covering, sterile ultrasound probe cover (if used).  Procedure Description Ultrasound was used to identify appropriate pleural anatomy for placement and overlying skin marked.  Area of drainage cleaned and draped in sterile fashion. Lidocaine was used to anesthetize the skin and subcutaneous tissue.  400 cc's of amber appearing fluid was drained from the right pleural space. Catheter then removed and bandaid applied to site.   Complications/Tolerance None; patient tolerated the procedure well. Chest X-ray is ordered to confirm no post-procedural complication.   EBL Minimal   Specimen(s) Pleural fluid cytology, culture, LDH, protein, cell count, albumin  Roselie Awkward, MD [facility] Pager: [phone] Cell: [phone] After 7:00 pm call Elink  [phone]

## 2022-01-12 NOTE — Progress Notes (Signed)
Patient is not interested in wearing bipap at this time. RT informed pt to call if she changes her mind

## 2022-01-12 NOTE — H&P (Signed)
[facility] PCCM  CC: Dyspnea HPI:  66 y/o female with COPD and CHF admitted for a COPD exacerbation, PCCM consulted in setting of bilateral pleural effusions.  She has a history of breast cancer.  Diuresed overnight but still has a right pleural effusion on CXR today.    Past Medical History:  Diagnosis Date   Acute respiratory failure with hypoxia ([place]) 09/30/2018   Breast cancer ([place])    Constipation    COPD exacerbation ([place]) 01/05/2019   Depression with anxiety 09/30/2018   Dyspnea    Essential hypertension 09/30/2018   History of radiation therapy    Right breast- 09/01/21-10/16/21- Dr. Gery Pray   Hypertension    Tobacco dependence due to cigarettes 09/30/2018     Family History  Problem Relation Age of Onset   COPD Mother    Throat cancer Maternal Grandmother    Prostate cancer Other    Prostate cancer Other    Prostate cancer Other    Esophageal cancer Maternal Aunt      Social History   Socioeconomic History   Marital status: Single    Spouse name: Not on file   Number of children: Not on file   Years of education: Not on file   Highest education level: Not on file  Occupational History   Not on file  Tobacco Use   Smoking status: Former    Packs/day: 0.80    Years: 43.00    Total pack years: 34.40    Types: Cigarettes   Smokeless tobacco: Never   Tobacco comments:    Quit smoking 2 - 3 months a go 11/30/21  Vaping Use   Vaping Use: Never used  Substance and Sexual Activity   Alcohol use: Yes    Comment: gets 6 beers on a weekend and drinks them   Drug use: Not Currently    Types: Marijuana    Comment: tried to smoke some about a month ago   Sexual activity: Not on file  Other Topics Concern   Not on file  Social History Narrative   Not on file   Social Determinants of Health   Financial Resource Strain: Not on file  Food Insecurity: Not on file  Transportation Needs: Not on file  Physical Activity: Not on file  Stress: Not on file  Social  Connections: Not on file  Intimate Partner Violence: Not on file     Allergies  Allergen Reactions   Crab [Shellfish Allergy] Anaphylaxis and Swelling    Dungeness crab   Erythromycin Other (See Comments)    convulsion   Hydrocodone Nausea And Vomiting    Severe vomiting      '@encmedstart'$ @  Vitals:   01/12/22 0807 01/12/22 0856 01/12/22 1224 01/12/22 1321  BP:  110/74 134/70 133/81  Pulse: 91 93 93 97  Resp: '20 20 20 '$ (!) 21  Temp:   98.6 F (37 C)   TempSrc:   Oral   SpO2: 91% (!) 88% 98% 99%  Weight:      Height:       General:  Resting comfortably in bed HENT: NCAT OP clear PULM: CTA B, normal effort CV: RRR, no mgr GI: BS+, soft, nontender MSK: normal bulk and tone Neuro: awake, alert, no distress, MAEW  CBC    Component Value Date/Time   WBC 7.9 01/11/2022 0320   RBC 3.99 01/11/2022 0320   HGB 12.2 01/11/2022 0320   HGB 15.6 (H) 06/03/2021 1234   HGB 17.3 (H) 09/26/2019 1650  HCT 37.9 01/11/2022 0320   HCT 52.2 (H) 09/26/2019 1650   PLT 329 01/11/2022 0320   PLT 247 06/03/2021 1234   PLT 236 09/26/2019 1650   MCV 95.0 01/11/2022 0320   MCV 88 09/26/2019 1650   MCH 30.6 01/11/2022 0320   MCHC 32.2 01/11/2022 0320   RDW 14.5 01/11/2022 0320   RDW 19.4 (H) 09/26/2019 1650   LYMPHSABS 0.6 (L) 01/10/2022 1615   MONOABS 0.2 01/10/2022 1615   EOSABS 0.0 01/10/2022 1615   BASOSABS 0.0 01/10/2022 1615    CXR: R>L moderate sized pleural effusion  Impression COPD exacerbation CHF exacerbation Bilateral pleural effusion History of breast cancer  Plan: Diagnostic thoracentesis on right  Roselie Awkward, MD [facility] Pager: [phone] Cell: [phone] After 7:00 pm call Elink  [phone]

## 2022-01-12 NOTE — Progress Notes (Signed)
   NAME:  Caroline Greer, MRN:  [id number], DOB:  [birth year], LOS: 1 ADMISSION DATE:  01/10/2022, CONSULTATION DATE:  8/21 REFERRING MD:  Sloan Leiter, CHIEF COMPLAINT:  Dyspnea   History of Present Illness:  66 y/o female with severe on 3-4 L O2 at home and breast cancer presented with dyspnea, PCCM consulted due to bilateral effusions.   Pertinent  Medical History  - COPD on 3-4L O2 at home - Breast cancer s/p lumpectomy and radiation (finished in May 2023), now on Aromasin - HTN - History of tobacco use but quit 3 weeks ago  Significant Hospital Events: Including procedures, antibiotic start and stop dates in addition to other pertinent events   8/20: Worsening hypoxemic respiratory failure > started on BiPAP; CT angiogram negative for PE, R>L bilateral small pleural effusions 8/21: Weaned off BiPAP, started Lasix; PCCM consult, bedside ultrasound> small left effusion, small to moderate right effusion 8/22 CXR unchanged  Interim History / Subjective:  Breathing is a little better CXR unchanged  Objective   Blood pressure 122/71, pulse 91, temperature 98.8 F (37.1 C), temperature source Oral, resp. rate 20, height '5\' 10"'$  (1.778 m), weight 63.5 kg, SpO2 91 %.        Intake/Output Summary (Last 24 hours) at 01/12/2022 3354 Last data filed at 01/12/2022 0600 Gross per 24 hour  Intake --  Output 3300 ml  Net -3300 ml   Filed Weights   01/10/22 1705  Weight: 63.5 kg    Examination: General:  Resting comfortably in bed HENT: NCAT OP clear PULM: CTA B, normal effort CV: RRR, no mgr GI: BS+, soft, nontender MSK: normal bulk and tone Neuro: awake, alert, no distress, MAEW   Resolved Hospital Problem list     Assessment & Plan:  Acute hypoxemic respiratory failure due to COPD Bilateral pleural effusions History of breast cancer, s/p radidation/lumpectomy Hypertension GERD Cigarette smoker, quit 3 weeks prior to admisison  PLan: Duoneb, pulmicort to continue Ceftriaxone  to continue Wean off O2 for O2 saturation > 88% Change back to [place] at hospital discharge Could change solumedrol to prednisone per standard COPD order set Diuresis per primary Will plan for thoracentesis today> diagnostic and therapeutic, will send for routine studies including cytology   Best Practice (right click and "Reselect all SmartList Selections" daily)   Per primary  Critical care time: n/a    Roselie Awkward, MD [place] [facility] Pager: [phone] Cell: [phone] After 7:00 pm call Elink  [phone]

## 2022-01-13 ENCOUNTER — Encounter (HOSPITAL_COMMUNITY): Payer: Self-pay | Admitting: Pulmonary Disease

## 2022-01-13 ENCOUNTER — Other Ambulatory Visit (HOSPITAL_COMMUNITY): Payer: Self-pay

## 2022-01-13 ENCOUNTER — Inpatient Hospital Stay (HOSPITAL_COMMUNITY): Payer: Medicare Other

## 2022-01-13 DIAGNOSIS — N61 Mastitis without abscess: Secondary | ICD-10-CM | POA: Diagnosis not present

## 2022-01-13 DIAGNOSIS — J9621 Acute and chronic respiratory failure with hypoxia: Secondary | ICD-10-CM | POA: Diagnosis not present

## 2022-01-13 DIAGNOSIS — J90 Pleural effusion, not elsewhere classified: Secondary | ICD-10-CM | POA: Diagnosis not present

## 2022-01-13 DIAGNOSIS — J441 Chronic obstructive pulmonary disease with (acute) exacerbation: Secondary | ICD-10-CM | POA: Diagnosis not present

## 2022-01-13 MED ORDER — CEPHALEXIN 500 MG PO CAPS
500.0000 mg | ORAL_CAPSULE | Freq: Three times a day (TID) | ORAL | 0 refills | Status: AC
  Filled 2022-01-13: qty 9, 3d supply, fill #0

## 2022-01-13 MED ORDER — IPRATROPIUM-ALBUTEROL 0.5-2.5 (3) MG/3ML IN SOLN: 3.0000 mL | Freq: Two times a day (BID) | RESPIRATORY_TRACT | Status: DC

## 2022-01-13 MED ORDER — PREDNISONE 10 MG PO TABS
ORAL_TABLET | ORAL | 0 refills | Status: DC
  Filled 2022-01-13: qty 10, 4d supply, fill #0

## 2022-01-13 NOTE — Progress Notes (Signed)
Occupational Therapy Treatment Patient Details Name: Caroline Greer MRN: [id number] DOB: 1955/08/31 Today's Date: 01/13/2022   History of present illness Pt is a 66 y/o female who presented with COPD exacerbation requiring up to 10 L HFNC and rescue BiPAP. PMH: COPD 4 L O2 at baseline, HTN, breast CA with radiation April-May.   OT comments  Patient able to get to EOB without assistance. Patient provided handout for energy conservation strategies and reviewed with  patient. Patient states she has some strategies already in place at home. SpO2 ranged from 90-85 while seated on EOB. Acute OT to continue to follow.    Recommendations for follow up therapy are one component of a multi-disciplinary discharge planning process, led by the attending physician.  Recommendations may be updated based on patient status, additional functional criteria and insurance authorization.    Follow Up Recommendations  No OT follow up    Assistance Recommended at Discharge PRN  Patient can return home with the following  Assist for transportation   Equipment Recommendations  None recommended by OT    Recommendations for Other Services      Precautions / Restrictions Precautions Precautions: Other (comment) Precaution Comments: monitor O2 sats, at 6 L now but 4L baseline Restrictions Weight Bearing Restrictions: No       Mobility Bed Mobility Overal bed mobility: Modified Independent                  Transfers Overall transfer level: Modified independent                       Balance                                           ADL either performed or assessed with clinical judgement   ADL Overall ADL's : Modified independent                                       General ADL Comments: able to get to EOB without assistance    Extremity/Trunk Assessment              Vision       Perception     Praxis      Cognition  Arousal/Alertness: Awake/alert Behavior During Therapy: WFL for tasks assessed/performed Overall Cognitive Status: Within Functional Limits for tasks assessed                                          Exercises      Shoulder Instructions       General Comments provided patient with energy conservation strategies and reviewed with patient    Pertinent Vitals/ Pain       Pain Assessment Pain Assessment: No/denies pain  Home Living                                          Prior Functioning/Environment              Frequency  Min 2X/week        Progress Toward Goals  OT Goals(current  goals can now be found in the care plan section)  Progress towards OT goals: Progressing toward goals  Acute Rehab OT Goals Patient Stated Goal: go home OT Goal Formulation: With patient Time For Goal Achievement: 01/25/22 Potential to Achieve Goals: Good ADL Goals Pt/caregiver will Perform Home Exercise Program: Increased strength;Both right and left upper extremity;With theraband;Independently;With written HEP provided Additional ADL Goal #1: Pt to increase standing tolerance > 10 min during ADLs, IADLs and mobility without rest break Additional ADL Goal #2: Pt to verbalize at least 3 energy conservation strategies to implement during daily tasks.  Plan Discharge plan remains appropriate    Co-evaluation                 AM-PAC OT "6 Clicks" Daily Activity     Outcome Measure   Help from another person eating meals?: None Help from another person taking care of personal grooming?: None Help from another person toileting, which includes using toliet, bedpan, or urinal?: None Help from another person bathing (including washing, rinsing, drying)?: None Help from another person to put on and taking off regular upper body clothing?: None Help from another person to put on and taking off regular lower body clothing?: None 6 Click Score: 24     End of Session Equipment Utilized During Treatment: Oxygen  OT Visit Diagnosis: Other (comment) (decreased cardiopulmonary tolerance)   Activity Tolerance Patient tolerated treatment well   Patient Left in bed;with call bell/phone within reach   Nurse Communication Mobility status        Time: 7371-0626 OT Time Calculation (min): 30 min  Charges: OT General Charges $OT Visit: 1 Visit OT Treatments $Therapeutic Activity: 23-37 mins  Lodema Hong, [place]  Office [place] 01/13/2022, 12:28 PM

## 2022-01-13 NOTE — Progress Notes (Signed)
PT Cancellation Note  Patient Details Name: Caroline Greer MRN: [id number] DOB: [birth year]   Cancelled Treatment:    Reason Eval/Treat Not Completed: (P) Patient declined, no reason specified (pt eager to DC defers stair trial; verbally reviewed activity pacing and safety with pt/daughter for stairs.)   [name] 01/13/2022, 2:41 PM

## 2022-01-13 NOTE — Progress Notes (Signed)
  Transition of Care [facility]) Screening Note   Patient Details  Name: Caroline Greer Date of Birth: 10/24/1955   Transition of Care [facility]) CM/SW Contact:    Cyndi Bender, RN Phone Number: 01/13/2022, 8:47 AM    Transition of Care Department [facility]) has reviewed patient and no TOC needs have been identified at this time. We will continue to monitor patient advancement through interdisciplinary progression rounds. If new patient transition needs arise, please place a TOC consult.

## 2022-01-13 NOTE — TOC Transition Note (Signed)
Transition of Care [facility]) - CM/SW Discharge Note   Patient Details  Name: Caroline Greer MRN: [id number] Date of Birth: 06-10-1955  Transition of Care [facility]) CM/SW Contact:  Cyndi Bender, RN Phone Number: 01/13/2022, 1:08 PM   Clinical Narrative:     Patient stable for discharge. Home health orders placed. Patient defers to [facility] to find highly rated agency. Cory with [facility] accepted referral. Patient states she doesn't have a home fill machine for her 69. Spoke to [place] with [employer] who confirms patient doesn't have home fill machine but will deliver home fill today. Portable 02 tank will be delivered to the room. Patient has apt with [employer] on 01/26/22 to be tested for POC. Patient's daughter will transport home.  Final next level of care: [place] Barriers to Discharge: Barriers Resolved   Patient Goals and CMS Choice Patient states their goals for this hospitalization and ongoing recovery are:: return home CMS Medicare.gov Compare Post Acute Care list provided to:: Patient Choice offered to / list presented to : Patient  Discharge Placement                 home      Discharge Plan and Services   Discharge Planning Services: CM Consult Post Acute Care Choice: Home Health, Durable Medical Equipment          DME Arranged: Oxygen DME Agency: [employer] Date DME Agency Contacted: 01/13/22 Time DME Agency Contacted: [phone] Representative spoke with at DME Agency: Dennard Schaumann Arranged: PT [place]: [place] Date [place]: 01/13/22 Time [place]: [place] Representative spoke with at [employer]: [place] ([place]) Interventions     Readmission Risk Interventions    01/13/2022    1:08 PM  Readmission Risk Prevention Plan  Transportation Screening Complete  PCP or Specialist Appt within 5-7 Days Complete  Home Care Screening Complete  Medication Review (RN CM) Complete

## 2022-01-13 NOTE — Discharge Summary (Signed)
PATIENT DETAILS Name: Caroline Greer Age: 66 y.o. Sex: female Date of Birth: 1955/09/22 MRN: [id number]. Admitting Physician: Jonetta Osgood, MD [id number], [name], MD  Admit Date: 01/10/2022 Discharge date: 01/13/2022  Recommendations for Outpatient Follow-up:  Follow up with PCP in 1-2 weeks Please obtain CMP/CBC in one week Please follow pleural fluid cytology.  Admitted From:  Home  Disposition: Home   Discharge Condition: good  CODE STATUS:   Code Status: Full Code   Diet recommendation:  Diet Order             Diet - low sodium heart healthy           Diet Heart Room service appropriate? Yes; Fluid consistency: Thin  Diet effective now                    Brief Summary: Patient is a 66 y.o.  female with history of COPD, chronic hypoxic respiratory failure-on 3-4 L of oxygen at home, history of breast CA-s/p lumpectomy and adjuvant radiation therapy-now on Aromasin-presented to the hospital with worsening shortness of breath-she was found to have acute hypoxic respiratory failure requiring BiPAP due to COPD exacerbation.   Significant events: 8/20>> worsening hypoxia-COPD exacerbation-started on BiPAP. 8/21>> liberated off BiPAP earlier this morning   Significant studies: 7/25>> Echo: EF 79-89%, grade 1 diastolic dysfunction. 8/20>> CT angio chest: No PE, moderate right/left pleural effusion.  Unchanged prominent bilateral hilar/mediastinal lymph nodes-likely reactive.   Significant microbiology data: 8/20>> COVID PCR: Negative   Procedures: None   Consults: None  Brief Hospital Course: Acute on chronic hypoxic respiratory failure (home O2 3-4 L) due to COPD exacerbation: Improved-treated with steroids/bronchodilators-tolerating around 4-5 L of oxygen which is not far from her baseline.  Lungs clear on exam-feels better and is requesting discharge.  Will discharge on her usual bronchodilator regimen-and tapering steroids.   Bilateral pleural  effusion: Given history of malignancy-PCCM consulted for diagnostic thoracocentesis-this was done on 8/22-appears to be a transudate-however lymphocytic predominant-awaiting cytology.  Thoracocentesis.     Right breast soft tissue infection: Breast erythema resolved-treated with IV Rocephin-we will transition to Keflex on discharge.  Admitting MD has already been notified Dr. [name] (primary oncologist) of this finding-he plans to follow-up in the outpatient setting after patient finishes a course of antimicrobial therapy.   5 mm right upper lobe nodule-unchanged from 2021: Likely benign.   Malignant neoplasm of upper-outer quadrant of right breast in female, estrogen receptor positive: S/p lumpectomy and radiation-currently on Aromasin.  Followed by Dr. Sharion Settler above regarding possible right breast cellulitis-and plans for close outpatient follow-up with oncology after patient completes a course of antimicrobial therapy   HTN: BP stable but slowly creeping up-resume metoprolol-continue amlodipine.    GERD: Continue PPI   Tobacco abuse: Counseled.   BMI: Estimated body mass index is 20.09 kg/m as calculated from the following:   Height as of this encounter: '5\' 10"'$  (1.778 m).   Weight as of this encounter: 63.5 kg.   Discharge Diagnoses:  Principal Problem:   COPD exacerbation ([place]) Active Problems:   Acute on chronic respiratory failure with hypoxia (HCC)   Cellulitis of right breast   Pleural effusion, bilateral   Chronic respiratory failure with hypoxia, on home O2 therapy (HCC) - 3 L/min   Essential hypertension   Tobacco dependence due to cigarettes   Malignant neoplasm of upper-outer quadrant of right breast in female, estrogen receptor positive [facility])   Discharge Instructions:  Activity:  As tolerated with Full  fall precautions use walker/cane & assistance as needed   Discharge Instructions     Call MD for:  difficulty breathing, headache or visual disturbances    Complete by: As directed    Call MD for:  redness, tenderness, or signs of infection (pain, swelling, redness, odor or green/yellow discharge around incision site)   Complete by: As directed    Diet - low sodium heart healthy   Complete by: As directed    Discharge instructions   Complete by: As directed    Follow with Primary MD  [name], MD in 1-2 weeks  Please follow-up with pulmonology office in the next 1-2 weeks.  Please follow-up with your oncology in the next 1-2 weeks  Cytology from pleural fluid is still pending-please have your primary care doctor or your pulmonologist follow-up on these results.  Please get a complete blood count and chemistry panel checked by your Primary MD at your next visit, and again as instructed by your Primary MD.  Get Medicines reviewed and adjusted: Please take all your medications with you for your next visit with your Primary MD  Laboratory/radiological data: Please request your Primary MD to go over all hospital tests and procedure/radiological results at the follow up, please ask your Primary MD to get all Hospital records sent to his/her office.  In some cases, they will be blood work, cultures and biopsy results pending at the time of your discharge. Please request that your primary care M.D. follows up on these results.  Also Note the following: If you experience worsening of your admission symptoms, develop shortness of breath, life threatening emergency, suicidal or homicidal thoughts you must seek medical attention immediately by calling 911 or calling your MD immediately  if symptoms less severe.  You must read complete instructions/literature along with all the possible adverse reactions/side effects for all the Medicines you take and that have been prescribed to you. Take any new Medicines after you have completely understood and accpet all the possible adverse reactions/side effects.   Do not drive when taking Pain medications  or sleeping medications (Benzodaizepines)  Do not take more than prescribed Pain, Sleep and Anxiety Medications. It is not advisable to combine anxiety,sleep and pain medications without talking with your primary care practitioner  Special Instructions: If you have smoked or chewed Tobacco  in the last 2 yrs please stop smoking, stop any regular Alcohol  and or any Recreational drug use.  Wear Seat belts while driving.  Please note: You were cared for by a hospitalist during your hospital stay. Once you are discharged, your primary care physician will handle any further medical issues. Please note that NO REFILLS for any discharge medications will be authorized once you are discharged, as it is imperative that you return to your primary care physician (or establish a relationship with a primary care physician if you do not have one) for your post hospital discharge needs so that they can reassess your need for medications and monitor your lab values.   Increase activity slowly   Complete by: As directed       Allergies as of 01/13/2022       Reactions   Crab [shellfish Allergy] Anaphylaxis, Swelling   Dungeness crab   Erythromycin Other (See Comments)   convulsion   Hydrocodone Nausea And Vomiting   Severe vomiting         Medication List     TAKE these medications    acetaminophen 500 MG tablet Commonly known  as: TYLENOL Take 1,000 mg by mouth daily as needed (pain).   ALPRAZolam 0.5 MG tablet Commonly known as: XANAX Take 0.5 mg by mouth at bedtime as needed for sleep.   amLODipine 10 MG tablet Commonly known as: NORVASC Take 10 mg by mouth daily.   aspirin EC 81 MG tablet Take 81 mg by mouth daily.   atorvastatin 10 MG tablet Commonly known as: LIPITOR Take 1 tablet (10 mg total) by mouth daily. What changed: when to take this   Breztri Aerosphere 160-9-4.8 MCG/ACT Aero Generic drug: Budeson-Glycopyrrol-Formoterol Inhale 2 puffs into the lungs in the morning  and at bedtime.   cephALEXin 500 MG capsule Commonly known as: Keflex Take 1 capsule (500 mg total) by mouth 3 (three) times daily for 3 days.   cyanocobalamin 500 MCG tablet Commonly known as: VITAMIN B12 Take 500 mcg by mouth daily.   exemestane 25 MG tablet Commonly known as: AROMASIN Take 0.5 tablets (12.5 mg total) by mouth daily after breakfast.   fluticasone 50 MCG/ACT nasal spray Commonly known as: FLONASE Place 2 sprays into both nostrils 2 (two) times daily.   furosemide 40 MG tablet Commonly known as: LASIX Take 1 tablet (40 mg total) by mouth daily as needed. Take 1 tablet daily x 2 days, then as needed. What changed:  when to take this additional instructions   guaiFENesin 600 MG 12 hr tablet Commonly known as: MUCINEX Take 600 mg by mouth 2 (two) times daily as needed for to loosen phlegm.   ipratropium-albuterol 0.5-2.5 (3) MG/3ML Soln Commonly known as: DUONEB Use 1 vial via nebulizer 2 times daily for 3 days then 2 times daily as need.(shortness of breath)   loratadine 10 MG tablet Commonly known as: CLARITIN Take 1 tablet (10 mg total) by mouth daily. What changed:  when to take this reasons to take this   magnesium hydroxide 400 MG/5ML suspension Commonly known as: MILK OF MAGNESIA Take 15 mLs by mouth daily as needed for mild constipation.   metoprolol tartrate 25 MG tablet Commonly known as: LOPRESSOR Take 1 tablet (25 mg total) by mouth 2 (two) times daily.   nystatin 100000 UNIT/ML suspension Commonly known as: MYCOSTATIN Use as directed 4 mLs in the mouth or throat 4 (four) times daily as needed. thrush   OXYGEN Inhale 3 L into the lungs continuous.   pantoprazole 40 MG tablet Commonly known as: PROTONIX Take 1 tablet (40 mg total) by mouth daily at 6 (six) AM.   polyethylene glycol 17 g packet Commonly known as: MIRALAX / GLYCOLAX Take 17 g by mouth daily. What changed:  when to take this reasons to take this   predniSONE 10  MG tablet Commonly known as: DELTASONE Take 40 mg daily for 1 day, 30 mg daily for 1 day, 20 mg daily for 1 days,10 mg daily for 1 day, then stop   ProAir HFA 108 (90 Base) MCG/ACT inhaler Generic drug: albuterol Inhale 2 puffs into the lungs every 6 (six) hours as needed for wheezing or shortness of breath. 90 day supply   Vitamin D3 25 MCG (1000 UT) Caps Take 1,000 Units by mouth daily.   Vitamin E Skin Oil Apply 1 Application topically daily.        Follow-up Information     [facility] Follow up.   Specialty: Home Health Services Why: Home health has been arranged. They will contact you to schedule apt within 48 hours. Contact information: [address] [place] [address] [phone]                Allergies  Allergen Reactions   Crab [Shellfish Allergy] Anaphylaxis and Swelling    Dungeness crab   Erythromycin Other (See Comments)    convulsion   Hydrocodone Nausea And Vomiting    Severe vomiting      Other Procedures/Studies: DG CHEST PORT 1 VIEW  Result Date: 01/13/2022 CLINICAL DATA:  [id number]; status post right thoracentesis EXAM: PORTABLE CHEST 1 VIEW COMPARISON:  January 12, 2022 FINDINGS: The heart size and mediastinal contours are within normal limits. Lungs are hyperinflated and remain clear. No right-sided pneumothorax. The visualized skeletal structures are unremarkable except for the stable surgical clips at the right axilla. IMPRESSION: Lungs are hyperinflated and remain clear.  No new finding. Electronically Signed   By: Frazier Richards M.D.   On: 01/13/2022 08:23   DG CHEST PORT 1 VIEW  Result Date: 01/12/2022 CLINICAL DATA:  Status post thoracentesis. EXAM: PORTABLE CHEST 1 VIEW COMPARISON:  Chest x-ray 01/12/2022. FINDINGS: Decreased right pleural effusion. No visible pneumothorax. No consolidation. Cardiomediastinal silhouette is unchanged in within normal limits. No acute osseous abnormality. Right breast clips.  IMPRESSION: Decreased right pleural effusion.  No visible pneumothorax. Electronically Signed   By: Margaretha Sheffield M.D.   On: 01/12/2022 14:22   DG Chest Port 1 View  Result Date: 01/12/2022 CLINICAL DATA:  [id number].  Pleural effusion follow-up. EXAM: PORTABLE CHEST 1 VIEW COMPARISON:  Chest CT with contrast August 20. FINDINGS: 4:47 a.m. Small right-greater-than-left pleural effusions are again noted, with asymmetric opacity overlying the right effusion which could be atelectasis or pneumonia. Remaining lungs are clear with COPD change. The cardiac size is normal. The mediastinum is normally outlined. There is aortic atherosclerosis. Old right axillary surgical clips.  There is osteopenia. IMPRESSION: The overall aeration is unchanged. Small right-greater-than-left pleural effusions and overlying opacities of the right base show no improvement or worsening. The cardiac size is normal. Electronically Signed   By: Telford Nab M.D.   On: 01/12/2022 07:36   CT Angio Chest PE W and/or Wo Contrast  Result Date: 01/10/2022 CLINICAL DATA:  Pulmonary embolism (PE) suspected, high prob Shortness of breath.  Decreased oxygen saturation. EXAM: CT ANGIOGRAPHY CHEST WITH CONTRAST TECHNIQUE: Multidetector CT imaging of the chest was performed using the standard protocol during bolus administration of intravenous contrast. Multiplanar CT image reconstructions and MIPs were obtained to evaluate the vascular anatomy. RADIATION DOSE REDUCTION: This exam was performed according to the departmental dose-optimization program which includes automated exposure control, adjustment of the mA and/or kV according to patient size and/or use of iterative reconstruction technique. CONTRAST:  40m OMNIPAQUE IOHEXOL 350 MG/ML SOLN COMPARISON:  Radiograph earlier today. Chest CTA 1 month ago 12/09/2021, additional prior chest CTs reviewed. FINDINGS: Cardiovascular: There are no filling defects within the pulmonary arteries to suggest  pulmonary embolus. Upper normal heart size. Atherosclerosis of the thoracic aorta. No dissection or acute aortic findings. There are coronary artery calcifications. No pericardial effusion. Mediastinum/Nodes: Prominent bilateral hilar lymph nodes, 13 mm on the right and 11 mm on the left. No significant change from prior exam. There also shotty mediastinal nodes, all subcentimeter short axis. Previous pretracheal node measures 10 mm, currently 12 mm. Decompressed esophagus. No thyroid nodule. Right axillary surgical clips. No axillary adenopathy. Lungs/Pleura: Moderate right and small to moderate left pleural effusion, increased from CT last month. There is associated compressive atelectasis. Moderate emphysema. There is minor linear atelectasis  in the right middle lobe. The previous right upper lobe 5 mm nodule currently measures 4 mm, stable from 2021 exam and considered benign. There is slight smooth septal thickening which may represent pulmonary edema. Upper Abdomen: No acute findings. Musculoskeletal: Remote right rib fractures with callus. There are no acute or suspicious osseous abnormalities. Surgical clips in the right breast. Review of the MIP images confirms the above findings. IMPRESSION: 1. No pulmonary embolus. 2. Moderate right and small to moderate left pleural effusions, increased from CT last month. There is associated compressive atelectasis. 3. Slight smooth septal thickening may represent pulmonary edema. 4. Unchanged prominent bilateral hilar and mediastinal lymph nodes, likely reactive. Aortic Atherosclerosis (ICD10-I70.0) and Emphysema (ICD10-J43.9). Electronically Signed   By: Keith Rake M.D.   On: 01/10/2022 19:31   DG Chest Portable 1 View  Result Date: 01/10/2022 CLINICAL DATA:  Shortness of breath EXAM: PORTABLE CHEST 1 VIEW COMPARISON:  11/29/2021 FINDINGS: There is hyperinflation of the lungs compatible with COPD. Small bilateral pleural effusions. Bibasilar opacities. Heart  is normal size. Aortic calcifications. No acute bony abnormality. IMPRESSION: Hyperinflation/COPD. Small bilateral effusions with bibasilar atelectasis or infiltrates. Electronically Signed   By: Rolm Baptise M.D.   On: 01/10/2022 17:53   ECHOCARDIOGRAM COMPLETE  Result Date: 12/15/2021    ECHOCARDIOGRAM REPORT   Patient Name:   SYNTHIA FAIRBANK Date of Exam: 12/15/2021 Medical Rec #:  [id number]     Height:       70.7 in Accession #:    [id number]    Weight:       134.2 lb Date of Birth:  [birth year]     BSA:          1.775 m Patient Age:    67 years      BP:           128/86 mmHg Patient Gender: F             HR:           86 bpm. Exam Location:  Inpatient Procedure: 2D Echo, Cardiac Doppler and Color Doppler Indications:    Dyspnea R06.00  History:        Patient has prior history of Echocardiogram examinations, most                 recent 11/14/2020. COPD; Risk Factors:Hypertension and Former                 Smoker. History of breast cancer. Acute on chronic respiratory                 failure with hypoxia.  Sonographer:    Darlina Sicilian RDCS Referring Phys: [place]  1. Left ventricular ejection fraction, by estimation, is 60 to 65%. The left ventricle has normal function. The left ventricle has no regional wall motion abnormalities. There is mild left ventricular hypertrophy. Left ventricular diastolic parameters are consistent with Grade I diastolic dysfunction (impaired relaxation).  2. Right ventricular systolic function is normal. The right ventricular size is mildly enlarged.  3. No evidence of mitral valve regurgitation.  4. Aortic valve regurgitation is not visualized. Aortic valve sclerosis/calcification is present, without any evidence of aortic stenosis. Comparison(s): No significant change from prior study. FINDINGS  Left Ventricle: Left ventricular ejection fraction, by estimation, is 60 to 65%. The left ventricle has normal function. The left ventricle has no regional wall  motion abnormalities. The left ventricular internal cavity size was normal in size. There is  mild left ventricular hypertrophy. Left ventricular diastolic parameters are consistent with Grade I diastolic dysfunction (impaired relaxation). Right Ventricle: The right ventricular size is mildly enlarged. Right ventricular systolic function is normal. Left Atrium: Left atrial size was normal in size. Right Atrium: Right atrial size was normal in size. Pericardium: Trivial pericardial effusion is present. Mitral Valve: Mild mitral annular calcification. No evidence of mitral valve regurgitation. Tricuspid Valve: The tricuspid valve is normal in structure. Tricuspid valve regurgitation is not demonstrated. Aortic Valve: Aortic valve regurgitation is not visualized. Aortic valve sclerosis/calcification is present, without any evidence of aortic stenosis. Pulmonic Valve: The pulmonic valve was not well visualized. Pulmonic valve regurgitation is not visualized. Aorta: The aortic root is normal in size and structure.  LEFT VENTRICLE PLAX 2D LVIDd:         3.90 cm   Diastology LVIDs:         2.70 cm   LV e' medial:    4.47 cm/s LV PW:         1.00 cm   LV E/e' medial:  15.0 LV IVS:        1.20 cm   LV e' lateral:   6.94 cm/s LVOT diam:     2.30 cm   LV E/e' lateral: 9.7 LV SV:         78 LV SV Index:   44 LVOT Area:     4.15 cm  RIGHT VENTRICLE RV Basal diam:  4.60 cm RV Mid diam:    2.80 cm RV S prime:     17.50 cm/s TAPSE (M-mode): 2.0 cm LEFT ATRIUM             Index        RIGHT ATRIUM           Index LA diam:        3.20 cm 1.80 cm/m   RA Area:     14.50 cm LA Vol (A2C):   35.6 ml 20.05 ml/m  RA Volume:   35.80 ml  20.16 ml/m LA Vol (A4C):   36.3 ml 20.45 ml/m LA Biplane Vol: 38.2 ml 21.52 ml/m  AORTIC VALVE LVOT Vmax:   90.80 cm/s LVOT Vmean:  69.600 cm/s LVOT VTI:    0.188 m  AORTA Ao Root diam: 3.00 cm MITRAL VALVE MV Area (PHT): 3.57 cm    SHUNTS MV Decel Time: 213 msec    Systemic VTI:  0.19 m MV E  velocity: 67.05 cm/s  Systemic Diam: 2.30 cm MV A velocity: 71.10 cm/s MV E/A ratio:  0.94 Mary Scientist, physiological signed by Phineas Inches Signature Date/Time: 12/15/2021/11:33:48 AM    Final      TODAY-DAY OF DISCHARGE:  Subjective:   Naveya Ellerman today has no headache,no chest abdominal pain,no new weakness tingling or numbness, feels much better wants to go home today.   Objective:   Blood pressure 134/75, pulse 93, temperature 98.2 F (36.8 C), temperature source Oral, resp. rate 20, height '5\' 10"'$  (1.778 m), weight 63.5 kg, SpO2 92 %.  Intake/Output Summary (Last 24 hours) at 01/13/2022 1304 Last data filed at 01/13/2022 0900 Gross per 24 hour  Intake 480 ml  Output 450 ml  Net 30 ml   Filed Weights   01/10/22 1705  Weight: 63.5 kg    Exam: Awake Alert, Oriented *3, No new F.N deficits, Normal affect [place].AT,PERRAL Supple Neck,No JVD, No cervical lymphadenopathy appriciated.  Symmetrical Chest wall movement, Good air movement bilaterally, CTAB RRR,No Gallops,Rubs or  new Murmurs, No Parasternal Heave +ve B.Sounds, Abd Soft, Non tender, No organomegaly appriciated, No rebound -guarding or rigidity. No Cyanosis, Clubbing or edema, No new Rash or bruise   PERTINENT RADIOLOGIC STUDIES: DG CHEST PORT 1 VIEW  Result Date: 01/13/2022 CLINICAL DATA:  [id number]; status post right thoracentesis EXAM: PORTABLE CHEST 1 VIEW COMPARISON:  January 12, 2022 FINDINGS: The heart size and mediastinal contours are within normal limits. Lungs are hyperinflated and remain clear. No right-sided pneumothorax. The visualized skeletal structures are unremarkable except for the stable surgical clips at the right axilla. IMPRESSION: Lungs are hyperinflated and remain clear.  No new finding. Electronically Signed   By: Frazier Richards M.D.   On: 01/13/2022 08:23   DG CHEST PORT 1 VIEW  Result Date: 01/12/2022 CLINICAL DATA:  Status post thoracentesis. EXAM: PORTABLE CHEST 1 VIEW COMPARISON:  Chest x-ray  01/12/2022. FINDINGS: Decreased right pleural effusion. No visible pneumothorax. No consolidation. Cardiomediastinal silhouette is unchanged in within normal limits. No acute osseous abnormality. Right breast clips. IMPRESSION: Decreased right pleural effusion.  No visible pneumothorax. Electronically Signed   By: Margaretha Sheffield M.D.   On: 01/12/2022 14:22   DG Chest Port 1 View  Result Date: 01/12/2022 CLINICAL DATA:  [id number].  Pleural effusion follow-up. EXAM: PORTABLE CHEST 1 VIEW COMPARISON:  Chest CT with contrast August 20. FINDINGS: 4:47 a.m. Small right-greater-than-left pleural effusions are again noted, with asymmetric opacity overlying the right effusion which could be atelectasis or pneumonia. Remaining lungs are clear with COPD change. The cardiac size is normal. The mediastinum is normally outlined. There is aortic atherosclerosis. Old right axillary surgical clips.  There is osteopenia. IMPRESSION: The overall aeration is unchanged. Small right-greater-than-left pleural effusions and overlying opacities of the right base show no improvement or worsening. The cardiac size is normal. Electronically Signed   By: Telford Nab M.D.   On: 01/12/2022 07:36     PERTINENT LAB RESULTS: CBC: Recent Labs    01/10/22 1615 01/11/22 0320  WBC 6.3 7.9  HGB 13.7 12.2  HCT 43.4 37.9  PLT 356 329   CMET CMP     Component Value Date/Time   NA 137 01/11/2022 0320   NA 139 05/13/2020 1436   K 4.4 01/11/2022 0320   CL 95 (L) 01/11/2022 0320   CO2 34 (H) 01/11/2022 0320   GLUCOSE 147 (H) 01/11/2022 0320   BUN 13 01/11/2022 0320   BUN 6 (L) 05/13/2020 1436   CREATININE 0.61 01/11/2022 0320   CREATININE 0.57 06/03/2021 1234   CALCIUM 8.9 01/11/2022 0320   PROT 6.7 01/12/2022 1513   PROT 7.1 05/13/2020 1436   ALBUMIN 3.4 (L) 01/10/2022 1615   ALBUMIN 4.3 05/13/2020 1436   AST 14 (L) 01/10/2022 1615   AST 15 06/03/2021 1234   ALT 17 01/10/2022 1615   ALT 14 06/03/2021 1234   ALKPHOS  59 01/10/2022 1615   BILITOT 0.7 01/10/2022 1615   BILITOT 0.5 06/03/2021 1234   GFRNONAA >60 01/11/2022 0320   GFRNONAA >60 06/03/2021 1234   GFRAA 95 05/13/2020 1436    GFR Estimated Creatinine Clearance: 67.5 mL/min (by C-G formula based on SCr of 0.61 mg/dL). No results for input(s): "LIPASE", "AMYLASE" in the last 72 hours. No results for input(s): "CKTOTAL", "CKMB", "CKMBINDEX", "TROPONINI" in the last 72 hours. Invalid input(s): "POCBNP" No results for input(s): "DDIMER" in the last 72 hours. No results for input(s): "HGBA1C" in the last 72 hours. No results for input(s): "CHOL", "HDL", "LDLCALC", "TRIG", "CHOLHDL", "LDLDIRECT"  in the last 72 hours. No results for input(s): "TSH", "T4TOTAL", "T3FREE", "THYROIDAB" in the last 72 hours.  Invalid input(s): "FREET3" No results for input(s): "VITAMINB12", "FOLATE", "FERRITIN", "TIBC", "IRON", "RETICCTPCT" in the last 72 hours. Coags: No results for input(s): "INR" in the last 72 hours.  Invalid input(s): "PT" Microbiology: Recent Results (from the past 240 hour(s))  SARS Coronavirus 2 by RT PCR (hospital order, performed in [facility] hospital lab) *cepheid single result test* Anterior Nasal Swab     Status: None   Collection Time: 01/10/22  5:03 PM   Specimen: Anterior Nasal Swab  Result Value Ref Range Status   SARS Coronavirus 2 by RT PCR NEGATIVE NEGATIVE Final    Comment: (NOTE) SARS-CoV-2 target nucleic acids are NOT DETECTED.  The SARS-CoV-2 RNA is generally detectable in upper and lower respiratory specimens during the acute phase of infection. The lowest concentration of SARS-CoV-2 viral copies this assay can detect is 250 copies / mL. A negative result does not preclude SARS-CoV-2 infection and should not be used as the sole basis for treatment or other patient management decisions.  A negative result may occur with improper specimen collection / handling, submission of specimen other than nasopharyngeal swab,  presence of viral mutation(s) within the areas targeted by this assay, and inadequate number of viral copies (<250 copies / mL). A negative result must be combined with clinical observations, patient history, and epidemiological information.  Fact Sheet for Patients:   https://www.patel.info/  Fact Sheet for Healthcare Providers: https://hall.com/  This test is not yet approved or  cleared by the [place] FDA and has been authorized for detection and/or diagnosis of SARS-CoV-2 by FDA under an Emergency Use Authorization (EUA).  This EUA will remain in effect (meaning this test can be used) for the duration of the COVID-19 declaration under Section 564(b)(1) of the Act, 21 U.S.C. section 360bbb-3(b)(1), unless the authorization is terminated or revoked sooner.  Performed at [facility], [place] [address], [place], [place] [address]   MRSA Next Gen by PCR, Nasal     Status: None   Collection Time: 01/10/22  8:45 PM   Specimen: Nasal Mucosa; Nasal Swab  Result Value Ref Range Status   MRSA by PCR Next Gen NOT DETECTED NOT DETECTED Final    Comment: (NOTE) The GeneXpert MRSA Assay (FDA approved for NASAL specimens only), is one component of a comprehensive MRSA colonization surveillance program. It is not intended to diagnose MRSA infection nor to guide or monitor treatment for MRSA infections. Test performance is not FDA approved in patients less than 3 years old. Performed at [facility], [place] [address] [place], [place] [address]   Body fluid culture w Gram Stain     Status: None (Preliminary result)   Collection Time: 01/12/22  1:52 PM   Specimen: Pleural Fluid  Result Value Ref Range Status   Specimen Description PLEURAL  Final   Special Requests RIGHT LUNG  Final   Gram Stain   Final    WBC PRESENT, PREDOMINANTLY MONONUCLEAR NO ORGANISMS SEEN CYTOSPIN SMEAR    Culture   Final    NO GROWTH < 24  HOURS Performed at [facility], [place] [address], [place], [place] [address]    Report Status PENDING  Incomplete    FURTHER DISCHARGE INSTRUCTIONS:  Get Medicines reviewed and adjusted: Please take all your medications with you for your next visit with your Primary MD  Laboratory/radiological data: Please request your Primary  MD to go over all hospital tests and procedure/radiological results at the follow up, please ask your Primary MD to get all Hospital records sent to his/her office.  In some cases, they will be blood work, cultures and biopsy results pending at the time of your discharge. Please request that your primary care M.D. goes through all the records of your hospital data and follows up on these results.  Also Note the following: If you experience worsening of your admission symptoms, develop shortness of breath, life threatening emergency, suicidal or homicidal thoughts you must seek medical attention immediately by calling 911 or calling your MD immediately  if symptoms less severe.  You must read complete instructions/literature along with all the possible adverse reactions/side effects for all the Medicines you take and that have been prescribed to you. Take any new Medicines after you have completely understood and accpet all the possible adverse reactions/side effects.   Do not drive when taking Pain medications or sleeping medications (Benzodaizepines)  Do not take more than prescribed Pain, Sleep and Anxiety Medications. It is not advisable to combine anxiety,sleep and pain medications without talking with your primary care practitioner  Special Instructions: If you have smoked or chewed Tobacco  in the last 2 yrs please stop smoking, stop any regular Alcohol  and or any Recreational drug use.  Wear Seat belts while driving.  Please note: You were cared for by a hospitalist during your hospital stay. Once you are discharged, your primary care physician will  handle any further medical issues. Please note that NO REFILLS for any discharge medications will be authorized once you are discharged, as it is imperative that you return to your primary care physician (or establish a relationship with a primary care physician if you do not have one) for your post hospital discharge needs so that they can reassess your need for medications and monitor your lab values.  Total Time spent coordinating discharge including counseling, education and face to face time equals greater than 30 minutes.  SignedOren Binet 01/13/2022 1:04 PM

## 2022-01-13 NOTE — Progress Notes (Signed)
   NAME:  Caroline Greer, MRN:  [id number], DOB:  28-Sep-1955, LOS: 2 ADMISSION DATE:  01/10/2022, CONSULTATION DATE:  8/21 REFERRING MD:  Sloan Leiter, CHIEF COMPLAINT:  Dyspnea   History of Present Illness:  66 y/o female with severe on 3-4 L O2 at home and breast cancer presented with dyspnea, PCCM consulted due to bilateral effusions.   Pertinent  Medical History  - COPD on 3-4L O2 at home - Breast cancer s/p lumpectomy and radiation (finished in May 2023), now on Aromasin - HTN - History of tobacco use but quit 3 weeks ago  Significant Hospital Events: Including procedures, antibiotic start and stop dates in addition to other pertinent events   8/20: Worsening hypoxemic respiratory failure > started on BiPAP; CT angiogram negative for PE, R>L bilateral small pleural effusions 8/21: Weaned off BiPAP, started Lasix; PCCM consult, bedside ultrasound> small left effusion, small to moderate right effusion 8/22 CXR unchanged 01/13/2022 chest x-ray unremarkable other than flattened diaphragms and hyperinflation  Interim History / Subjective:  Decreased to 6 L nasal cannula  Objective   Blood pressure 123/70, pulse 89, temperature 98.2 F (36.8 C), temperature source Oral, resp. rate 20, height '5\' 10"'$  (1.778 m), weight 63.5 kg, SpO2 95 %.        Intake/Output Summary (Last 24 hours) at 01/13/2022 0843 Last data filed at 01/12/2022 1840 Gross per 24 hour  Intake --  Output 1450 ml  Net -1450 ml   Filed Weights   01/10/22 1705  Weight: 63.5 kg    Examination: General: Female in no distress HEENT: MM pink/moist no JVD is appreciated Neuro: Grossly intact without focal defect CV: Heart sounds are regular PULM: Diminished throughout O2 decreased to 6 L for sats of 100%    GI: soft, bsx4 GU: Voids Extremities: warm/dry, negative edema  Skin: no rashes or lesions    Resolved Hospital Problem list     Assessment & Plan:  Acute hypoxemic respiratory failure due to COPD Bilateral  pleural effusions History of breast cancer, s/p radidation/lumpectomy Hypertension GERD Cigarette smoker, quit 3 weeks prior to [place]: Continue bronchodilators with DuoNeb with Pulmicort Currently on ceftriaxone O2 was weaned down to 6 L today for sats of 100% Currently on Solu-Medrol 40 mg every 12 could transition to prednisone 40 mg daily if so desired  Intake/Output Summary (Last 24 hours) at 01/13/2022 0845 Last data filed at 01/12/2022 1840 Gross per 24 hour  Intake --  Output 1450 ml  Net -1450 ml  Diuresis as tolerated Status post thoracentesis 8-22 -2023 to 450 cc obtain  Chest x-ray reviewed hyperinflated flattened diaphragms no complications from thoracentesis Results from thoracentesis pending   Best Practice (right click and "Reselect all SmartList Selections" daily)   Per primary  Critical care time: n/a    [name] ACNP Acute Care Nurse Practitioner [place] Please consult Amion 01/13/2022, 8:43 AM

## 2022-01-14 LAB — CYTOLOGY - NON PAP

## 2022-01-15 LAB — BODY FLUID CULTURE W GRAM STAIN: Culture: NO GROWTH

## 2022-01-16 LAB — LACTATE DEHYDROGENASE, PLEURAL OR PERITONEAL FLUID: LD, Fluid: 66 U/L — ABNORMAL HIGH (ref 3–23)

## 2022-01-16 LAB — ALBUMIN, PLEURAL OR PERITONEAL FLUID: Albumin, Fluid: 1.7 g/dL

## 2022-01-16 LAB — BODY FLUID CELL COUNT WITH DIFFERENTIAL
Eos, Fluid: 0 %
Lymphs, Fluid: 75 %
Monocyte-Macrophage-Serous Fluid: 24 % — ABNORMAL LOW (ref 50–90)
Neutrophil Count, Fluid: 1 % (ref 0–25)
Total Nucleated Cell Count, Fluid: 247 cu mm (ref 0–1000)

## 2022-01-16 LAB — PROTEIN, PLEURAL OR PERITONEAL FLUID: Total protein, fluid: <3 g/dL

## 2022-01-16 LAB — GLUCOSE, PLEURAL OR PERITONEAL FLUID: Glucose, Fluid: 119 mg/dL

## 2022-01-18 ENCOUNTER — Encounter: Payer: Self-pay | Admitting: *Deleted

## 2022-01-19 ENCOUNTER — Other Ambulatory Visit: Payer: Self-pay

## 2022-01-19 ENCOUNTER — Encounter: Payer: Self-pay | Admitting: Adult Health

## 2022-01-19 ENCOUNTER — Other Ambulatory Visit (HOSPITAL_COMMUNITY): Payer: Self-pay

## 2022-01-19 ENCOUNTER — Inpatient Hospital Stay: Payer: Medicare Other | Attending: Adult Health | Admitting: Adult Health

## 2022-01-19 VITALS — BP 113/70 | HR 75 | Temp 97.7°F | Resp 18 | Ht 70.0 in | Wt 140.3 lb

## 2022-01-19 DIAGNOSIS — C50411 Malignant neoplasm of upper-outer quadrant of right female breast: Secondary | ICD-10-CM | POA: Insufficient documentation

## 2022-01-19 DIAGNOSIS — E2839 Other primary ovarian failure: Secondary | ICD-10-CM

## 2022-01-19 DIAGNOSIS — Z79811 Long term (current) use of aromatase inhibitors: Secondary | ICD-10-CM | POA: Diagnosis not present

## 2022-01-19 DIAGNOSIS — Z170 Estrogen receptor positive status [ER+]: Secondary | ICD-10-CM | POA: Diagnosis not present

## 2022-01-19 NOTE — Progress Notes (Signed)
SURVIVORSHIP VISIT:    BRIEF ONCOLOGIC HISTORY:  Oncology History  Malignant neoplasm of upper-outer quadrant of right breast in female, estrogen receptor positive ([place])  05/20/2021 Initial Diagnosis   Palpable mass in the right breast upper outer quadrant: 1.1 cm along with distortion.  Mammogram ultrasound revealed 1.2 cm mass at 11 o'clock position, axilla negative, biopsy revealed grade 2 invasive lobular cancer ER 90%, PR 100%, HER2 negative, Ki-67 5%   06/03/2021 Cancer Staging   Staging form: Breast, AJCC 8th Edition - Clinical stage from 06/03/2021: Stage IA (cT1b, cN0, cM0, G2, ER+, PR+, HER2-) - Signed by Nicholas Lose, MD on 06/03/2021 Stage prefix: Initial diagnosis Histologic grading system: 3 grade system   07/23/2021 Surgery   Right lumpectomy: Grade 2 invasive lobular cancer, LCIS, margins negative, 1/3 lymph nodes positive, ER 90%, PR 100%, HER2 negative, Ki-67 5%   08/03/2021 Cancer Staging   Staging form: Breast, AJCC 8th Edition - Pathologic: Stage IA (pT1c, pN1, cM0, G2, ER+, PR+, HER2-) - Signed by Nicholas Lose, MD on 08/03/2021 Stage prefix: Initial diagnosis Histologic grading system: 3 grade system   09/01/2021 - 10/16/2021 Radiation Therapy   Site Technique Total Dose (Gy) Dose per Fx (Gy) Completed Fx Beam Energies  Breast, Right: Breast_R 3D 50.4/50.4 1.8 28/28 6X, 10X  Breast, Right: Breast_R_SCLV 3D 50.4/50.4 1.8 28/28 10X  Breast, Right: Breast_R_Bst specialPort 12/12 2 6/6 9E, 12E       INTERVAL HISTORY:  Caroline Greer to review her survivorship care plan detailing her treatment course for breast cancer, as well as monitoring long-term side effects of that treatment, education regarding health maintenance, screening, and overall wellness and health promotion.     Overall, Caroline Greer reports feeling quite well   Exemestane 0.5 tab daily, tolerating moderately well with mild hot flashes.  D/c from hospital last week due to COPD exacerbation.  She has  completed steroids and antibiotics.  She is wearing oxygen, and has occasional DOE.    REVIEW OF SYSTEMS:  Review of Systems  Constitutional:  Negative for appetite change, chills, fatigue, fever and unexpected weight change.  HENT:   Negative for hearing loss, lump/mass and trouble swallowing.   Eyes:  Negative for eye problems and icterus.  Respiratory:  Positive for shortness of breath (chronic at baseline). Negative for chest tightness and cough.   Cardiovascular:  Negative for chest pain, leg swelling and palpitations.  Gastrointestinal:  Negative for abdominal distention, abdominal pain, constipation, diarrhea, nausea and vomiting.  Endocrine: Negative for hot flashes.  Genitourinary:  Negative for difficulty urinating.   Musculoskeletal:  Negative for arthralgias.  Skin:  Negative for itching and rash.  Neurological:  Negative for dizziness, extremity weakness, headaches and numbness.  Hematological:  Negative for adenopathy. Does not bruise/bleed easily.  Psychiatric/Behavioral:  Negative for depression. The patient is not nervous/anxious.    Breast: Denies any new nodularity, masses, tenderness, nipple changes, or nipple discharge.      ONCOLOGY TREATMENT TEAM:  1. Surgeon:  Dr. Marlou Starks at [facility] 2. Medical Oncologist: Dr. [name]  3. Radiation Oncologist: Dr. Sondra Come    PAST MEDICAL/SURGICAL HISTORY:  Past Medical History:  Diagnosis Date   Acute respiratory failure with hypoxia ([place]) 09/30/2018   Breast cancer ([place])    Constipation    COPD exacerbation ([place]) 01/05/2019   Depression with anxiety 09/30/2018   Dyspnea    Essential hypertension 09/30/2018   History of radiation therapy    Right breast- 09/01/21-10/16/21- Dr. Gery Pray   Hypertension  Tobacco dependence due to cigarettes 09/30/2018   Past Surgical History:  Procedure Laterality Date   BREAST LUMPECTOMY WITH RADIOACTIVE SEED AND SENTINEL LYMPH NODE BIOPSY Right 07/23/2021    Procedure: RIGHT BREAST LUMPECTOMY WITH RADIOACTIVE SEED AND SENTINEL LYMPH NODE BIOPSY;  Surgeon: Jovita Kussmaul, MD;  Location: [facility];  Service: General;  Laterality: Right;   HAND SURGERY Right 1997   Hand Fracture (right thumb & hand)   LAPAROTOMY  1974   RIGHT/LEFT HEART CATH AND CORONARY ANGIOGRAPHY N/A 10/02/2019   Procedure: RIGHT/LEFT HEART CATH AND CORONARY ANGIOGRAPHY;  Surgeon: Sherren Mocha, MD;  Location: [facility];  Service: Cardiovascular;  Laterality: N/A;   THORACENTESIS N/A 01/12/2022   Procedure: THORACENTESIS;  Surgeon: Juanito Doom, MD;  Location: [facility];  Service: Cardiopulmonary;  Laterality: N/A;     ALLERGIES:  Allergies  Allergen Reactions   Crab [Shellfish Allergy] Anaphylaxis and Swelling    Dungeness crab   Erythromycin Other (See Comments)    convulsion   Hydrocodone Nausea And Vomiting    Severe vomiting      CURRENT MEDICATIONS:  Outpatient Encounter Medications as of 01/19/2022  Medication Sig Note   acetaminophen (TYLENOL) 500 MG tablet Take 1,000 mg by mouth daily as needed (pain).    ALPRAZolam (XANAX) 0.5 MG tablet Take 0.5 mg by mouth at bedtime as needed for sleep.    amLODipine (NORVASC) 10 MG tablet Take 10 mg by mouth daily.    aspirin EC 81 MG tablet Take 81 mg by mouth daily.    Budeson-Glycopyrrol-Formoterol (BREZTRI AEROSPHERE) 160-9-4.8 MCG/ACT AERO Inhale 2 puffs into the lungs in the morning and at bedtime.    Cholecalciferol (VITAMIN D3) 25 MCG (1000 UT) CAPS Take 1,000 Units by mouth daily.    exemestane (AROMASIN) 25 MG tablet Take 0.5 tablets (12.5 mg total) by mouth daily after breakfast.    fluticasone (FLONASE) 50 MCG/ACT nasal spray Place 2 sprays into both nostrils 2 (two) times daily.    guaiFENesin (MUCINEX) 600 MG 12 hr tablet Take 600 mg by mouth 2 (two) times daily as needed for to loosen phlegm.    metoprolol tartrate (LOPRESSOR) 25 MG tablet Take 1 tablet (25 mg total) by mouth 2 (two) times  daily.    OXYGEN Inhale 3 L into the lungs continuous.    pantoprazole (PROTONIX) 40 MG tablet Take 1 tablet (40 mg total) by mouth daily at 6 (six) AM.    PROAIR HFA 108 (90 Base) MCG/ACT inhaler Inhale 2 puffs into the lungs every 6 (six) hours as needed for wheezing or shortness of breath. 90 day supply    vitamin B-12 (CYANOCOBALAMIN) 500 MCG tablet Take 500 mcg by mouth daily.    Vitamin E Skin OIL Apply 1 Application topically daily.    atorvastatin (LIPITOR) 10 MG tablet Take 1 tablet (10 mg total) by mouth daily. (Patient not taking: Reported on 01/19/2022)    furosemide (LASIX) 40 MG tablet Take 1 tablet (40 mg total) by mouth daily as needed. Take 1 tablet daily x 2 days, then as needed. (Patient not taking: Reported on 01/19/2022)    ipratropium-albuterol (DUONEB) 0.5-2.5 (3) MG/3ML SOLN Use 1 vial via nebulizer 2 times daily for 3 days then 2 times daily as need.(shortness of breath) (Patient not taking: Reported on 01/19/2022) 01/11/2022: Not picked up from pharmacy   loratadine (CLARITIN) 10 MG tablet Take 1 tablet (10 mg total) by mouth daily. (Patient not taking: Reported on 01/19/2022)  magnesium hydroxide (MILK OF MAGNESIA) 400 MG/5ML suspension Take 15 mLs by mouth daily as needed for mild constipation. (Patient not taking: Reported on 01/19/2022)    nystatin (MYCOSTATIN) 100000 UNIT/ML suspension Use as directed 4 mLs in the mouth or throat 4 (four) times daily as needed. thrush (Patient not taking: Reported on 01/19/2022)    polyethylene glycol (MIRALAX / GLYCOLAX) 17 g packet Take 17 g by mouth daily. (Patient not taking: Reported on 01/19/2022)    [DISCONTINUED] predniSONE (DELTASONE) 10 MG tablet Take 4 tablets (40 mg) daily for 1 day, 3 tabs (30 mg) daily for 1 day, 2 tabs daily for 1 days,1 tab daily for 1 day, then stop (Patient not taking: Reported on 01/19/2022)    No facility-administered encounter medications on file as of 01/19/2022.     ONCOLOGIC FAMILY HISTORY:  Family  History  Problem Relation Age of Onset   COPD Mother    Throat cancer Maternal Grandmother    Prostate cancer Other    Prostate cancer Other    Prostate cancer Other    Esophageal cancer Maternal Aunt       SOCIAL HISTORY:  Social History   Socioeconomic History   Marital status: Single    Spouse name: Not on file   Number of children: Not on file   Years of education: Not on file   Highest education level: Not on file  Occupational History   Not on file  Tobacco Use   Smoking status: Former    Packs/day: 0.80    Years: 43.00    Total pack years: 34.40    Types: Cigarettes   Smokeless tobacco: Never   Tobacco comments:    Quit smoking 2 - 3 months a go 11/30/21  Vaping Use   Vaping Use: Never used  Substance and Sexual Activity   Alcohol use: Yes    Comment: gets 6 beers on a weekend and drinks them   Drug use: Not Currently    Types: Marijuana    Comment: tried to smoke some about a month ago   Sexual activity: Not on file  Other Topics Concern   Not on file  Social History Narrative   Not on file   Social Determinants of Health   Financial Resource Strain: Not on file  Food Insecurity: Not on file  Transportation Needs: Not on file  Physical Activity: Not on file  Stress: Not on file  Social Connections: Not on file  Intimate Partner Violence: Not on file     OBSERVATIONS/OBJECTIVE:  BP 113/70 (BP Location: Left Arm, Patient Position: Sitting)   Pulse 75   Temp 97.7 F (36.5 C) (Tympanic)   Resp 18   Ht 5' 10"  (1.778 m)   Wt 140 lb 4.8 oz (63.6 kg)   SpO2 98%   BMI 20.13 kg/m  GENERAL: Patient is a well appearing female in no acute distress HEENT:  Sclerae anicteric.  Oropharynx clear and moist. No ulcerations or evidence of oropharyngeal candidiasis. Neck is supple.  NODES:  No cervical, supraclavicular, or axillary lymphadenopathy palpated.  BREAST EXAM:  right breast s/p lumpectomy and radiation, no sign of local recurrence, left breast  benign LUNGS:  Clear to auscultation bilaterally.  No wheezes or rhonchi. HEART:  Regular rate and rhythm. No murmur appreciated. ABDOMEN:  Soft, nontender.  Positive, normoactive bowel sounds. No organomegaly palpated. MSK:  No focal spinal tenderness to palpation. Full range of motion bilaterally in the upper extremities. EXTREMITIES:  No peripheral edema.  SKIN:  Clear with no obvious rashes or skin changes. No nail dyscrasia. NEURO:  Nonfocal. Well oriented.  Appropriate affect.   LABORATORY DATA:  None for this visit.  DIAGNOSTIC IMAGING:  None for this visit.      ASSESSMENT AND PLAN:  Caroline Greer is a pleasant 66 y.o. female with Stage IA right breast invasive ductal carcinoma, ER+/PR+/HER2-, diagnosed in 04/2021, treated with lumpectomy, adjuvant radiation therapy, and anti-estrogen therapy with Exemestane. She presents to the Survivorship Clinic for our initial meeting and routine follow-up post-completion of treatment for breast cancer.    1. Stage IA right breast cancer:  Caroline Greer is continuing to recover from definitive treatment for breast cancer. She will follow-up with her medical oncologist, Dr. [name] in 6 months with history and physical exam per surveillance protocol.  She will continue her anti-estrogen therapy with Exemestane. Thus far, she is tolerating the Exemestane well, with minimal side effects. She was instructed to make Dr. [name] or myself aware if she begins to experience any worsening side effects of the medication and I could see her back in clinic to help manage those side effects, as needed. Her mammogram is due 04/2022; orders placed today. Today, a comprehensive survivorship care plan and treatment summary was reviewed with the patient today detailing her breast cancer diagnosis, treatment course, potential late/long-term effects of treatment, appropriate follow-up care with recommendations for the future, and patient education resources.  A copy of  this summary, along with a letter will be sent to the patient's primary care provider via mail/fax/In Basket message after today's visit.    2. Bone health:  Given Caroline Greer's age/history of breast cancer and her current treatment regimen including anti-estrogen therapy with Exmestane, she is at risk for bone demineralization.  She is due for bone density testing and this was ordered today. She was given education on specific activities to promote bone health.  3. Cancer screening:  Due to Caroline Greer's history and her age, she should receive screening for skin cancers, colon cancer.  The information and recommendations are listed on the patient's comprehensive care plan/treatment summary and were reviewed in detail with the patient.    4. Health maintenance and wellness promotion: Caroline Greer was encouraged to consume 5-7 servings of fruits and vegetables per day. We reviewed the "Nutrition Rainbow" handout.  She was also encouraged to engage in moderate to vigorous exercise for 30 minutes per day most days of the week. We discussed the [employer] fitness program, which is designed for cancer survivors to help them become more physically fit after cancer treatments.  She was instructed to limit her alcohol consumption and continue to abstain from tobacco use.     5. Support services/counseling: It is not uncommon for this period of the patient's cancer care trajectory to be one of many emotions and stressors.  She was given information regarding our available services and encouraged to contact me with any questions or for help enrolling in any of our support group/programs.    Follow up instructions:    -Return to cancer center in 6 months for f/u  -Mammogram due in 04/2022 -Follow up with surgery 1 year -She is welcome to return back to the Survivorship Clinic at any time; no additional follow-up needed at this time.  -Consider referral back to survivorship as a long-term survivor for  continued surveillance  The patient was provided an opportunity to ask questions and all were answered. The patient agreed with the plan and  demonstrated an understanding of the instructions.   Total encounter time:40 minutes*in face-to-face visit time, chart review, lab review, care coordination, order entry, and documentation of the encounter time.    Wilber Bihari, NP 01/19/22 2:41 PM Medical Oncology and Hematology [facility] [place], [place] [address] Tel. [phone]    Fax. [phone]  *Total Encounter Time as defined by the Centers for Medicare and Medicaid Services includes, in addition to the face-to-face time of a patient visit (documented in the note above) non-face-to-face time: obtaining and reviewing outside history, ordering and reviewing medications, tests or procedures, care coordination (communications with other health care professionals or caregivers) and documentation in the medical record.

## 2022-01-20 ENCOUNTER — Telehealth: Payer: Self-pay | Admitting: Adult Health

## 2022-01-20 NOTE — Telephone Encounter (Signed)
Scheduled appointment per 8/29 los. Patient is aware.

## 2022-01-27 ENCOUNTER — Telehealth: Payer: Self-pay | Admitting: Pulmonary Disease

## 2022-01-27 NOTE — Telephone Encounter (Signed)
Called and spoke with pt who wanted to know when she could take the furosemide and I stated to her that the rx says she can take it prn. Stated to pt if she started having problems with her breathing or if she felt like she was retaining fluid or if her weight started picking up that she should take a furosemide. Pt verbalized understanding. Nothing further needed.

## 2022-01-27 NOTE — Telephone Encounter (Signed)
Patient would like the nurse or doctor to call asap regarding her medication for Furosemide which she stated the doctor prescribed.  She stated she has some questions and is not feeling well.  Please advise and call patient to discuss at [phone]

## 2022-01-28 ENCOUNTER — Telehealth: Payer: Self-pay | Admitting: Pulmonary Disease

## 2022-01-29 NOTE — Telephone Encounter (Signed)
Spoke with [facility] and notified ok give generic albuterol in place of proair.

## 2022-02-02 ENCOUNTER — Inpatient Hospital Stay (HOSPITAL_COMMUNITY): Payer: Medicare Other

## 2022-02-02 ENCOUNTER — Emergency Department (HOSPITAL_COMMUNITY): Payer: Medicare Other

## 2022-02-02 ENCOUNTER — Inpatient Hospital Stay (HOSPITAL_COMMUNITY)
Admission: EM | Admit: 2022-02-02 | Discharge: 2022-02-10 | DRG: 190 | Disposition: A | Discharge status: Home Health | Payer: Medicare Other | Attending: Internal Medicine | Admitting: Internal Medicine

## 2022-02-02 ENCOUNTER — Encounter (HOSPITAL_COMMUNITY): Payer: Self-pay

## 2022-02-02 ENCOUNTER — Other Ambulatory Visit: Payer: Self-pay

## 2022-02-02 DIAGNOSIS — E0590 Thyrotoxicosis, unspecified without thyrotoxic crisis or storm: Secondary | ICD-10-CM | POA: Diagnosis not present

## 2022-02-02 DIAGNOSIS — J9622 Acute and chronic respiratory failure with hypercapnia: Secondary | ICD-10-CM | POA: Diagnosis not present

## 2022-02-02 DIAGNOSIS — F17210 Nicotine dependence, cigarettes, uncomplicated: Secondary | ICD-10-CM | POA: Diagnosis present

## 2022-02-02 DIAGNOSIS — I4891 Unspecified atrial fibrillation: Secondary | ICD-10-CM

## 2022-02-02 DIAGNOSIS — J700 Acute pulmonary manifestations due to radiation: Secondary | ICD-10-CM | POA: Diagnosis present

## 2022-02-02 DIAGNOSIS — Z881 Allergy status to other antibiotic agents status: Secondary | ICD-10-CM

## 2022-02-02 DIAGNOSIS — Y842 Radiological procedure and radiotherapy as the cause of abnormal reaction of the patient, or of later complication, without mention of misadventure at the time of the procedure: Secondary | ICD-10-CM | POA: Diagnosis present

## 2022-02-02 DIAGNOSIS — K219 Gastro-esophageal reflux disease without esophagitis: Secondary | ICD-10-CM | POA: Diagnosis present

## 2022-02-02 DIAGNOSIS — N644 Mastodynia: Secondary | ICD-10-CM | POA: Diagnosis not present

## 2022-02-02 DIAGNOSIS — E875 Hyperkalemia: Secondary | ICD-10-CM | POA: Diagnosis not present

## 2022-02-02 DIAGNOSIS — I493 Ventricular premature depolarization: Secondary | ICD-10-CM | POA: Diagnosis not present

## 2022-02-02 DIAGNOSIS — Z79899 Other long term (current) drug therapy: Secondary | ICD-10-CM | POA: Diagnosis not present

## 2022-02-02 DIAGNOSIS — Z885 Allergy status to narcotic agent status: Secondary | ICD-10-CM | POA: Diagnosis not present

## 2022-02-02 DIAGNOSIS — Z853 Personal history of malignant neoplasm of breast: Secondary | ICD-10-CM

## 2022-02-02 DIAGNOSIS — E785 Hyperlipidemia, unspecified: Secondary | ICD-10-CM | POA: Diagnosis present

## 2022-02-02 DIAGNOSIS — Z20822 Contact with and (suspected) exposure to covid-19: Secondary | ICD-10-CM | POA: Diagnosis present

## 2022-02-02 DIAGNOSIS — F419 Anxiety disorder, unspecified: Secondary | ICD-10-CM | POA: Diagnosis present

## 2022-02-02 DIAGNOSIS — F32A Depression, unspecified: Secondary | ICD-10-CM | POA: Diagnosis present

## 2022-02-02 DIAGNOSIS — I10 Essential (primary) hypertension: Secondary | ICD-10-CM | POA: Diagnosis present

## 2022-02-02 DIAGNOSIS — F418 Other specified anxiety disorders: Secondary | ICD-10-CM | POA: Diagnosis present

## 2022-02-02 DIAGNOSIS — Z91013 Allergy to seafood: Secondary | ICD-10-CM

## 2022-02-02 DIAGNOSIS — I2510 Atherosclerotic heart disease of native coronary artery without angina pectoris: Secondary | ICD-10-CM | POA: Diagnosis present

## 2022-02-02 DIAGNOSIS — Z9981 Dependence on supplemental oxygen: Secondary | ICD-10-CM

## 2022-02-02 DIAGNOSIS — I358 Other nonrheumatic aortic valve disorders: Secondary | ICD-10-CM | POA: Diagnosis present

## 2022-02-02 DIAGNOSIS — J441 Chronic obstructive pulmonary disease with (acute) exacerbation: Principal | ICD-10-CM | POA: Diagnosis present

## 2022-02-02 DIAGNOSIS — R0789 Other chest pain: Secondary | ICD-10-CM | POA: Diagnosis not present

## 2022-02-02 DIAGNOSIS — Z825 Family history of asthma and other chronic lower respiratory diseases: Secondary | ICD-10-CM

## 2022-02-02 DIAGNOSIS — J9621 Acute and chronic respiratory failure with hypoxia: Secondary | ICD-10-CM | POA: Diagnosis not present

## 2022-02-02 DIAGNOSIS — Z7951 Long term (current) use of inhaled steroids: Secondary | ICD-10-CM

## 2022-02-02 DIAGNOSIS — Z923 Personal history of irradiation: Secondary | ICD-10-CM

## 2022-02-02 DIAGNOSIS — K5900 Constipation, unspecified: Secondary | ICD-10-CM | POA: Diagnosis present

## 2022-02-02 HISTORY — DX: Hyperlipidemia, unspecified: E78.5

## 2022-02-02 LAB — CBC WITH DIFFERENTIAL/PLATELET
Abs Immature Granulocytes: 0.02 10*3/uL (ref 0.00–0.07)
Basophils Absolute: 0 10*3/uL (ref 0.0–0.1)
Basophils Relative: 0 %
Eosinophils Absolute: 0.4 10*3/uL (ref 0.0–0.5)
Eosinophils Relative: 3 %
HCT: 42.6 % (ref 36.0–46.0)
Hemoglobin: 13.2 g/dL (ref 12.0–15.0)
Immature Granulocytes: 0 %
Lymphocytes Relative: 20 %
Lymphs Abs: 2.3 10*3/uL (ref 0.7–4.0)
MCH: 29.8 pg (ref 26.0–34.0)
MCHC: 31 g/dL (ref 30.0–36.0)
MCV: 96.2 fL (ref 80.0–100.0)
Monocytes Absolute: 0.8 10*3/uL (ref 0.1–1.0)
Monocytes Relative: 7 %
Neutro Abs: 8.1 10*3/uL — ABNORMAL HIGH (ref 1.7–7.7)
Neutrophils Relative %: 70 %
Platelets: 213 10*3/uL (ref 150–400)
RBC: 4.43 MIL/uL (ref 3.87–5.11)
RDW: 15.8 % — ABNORMAL HIGH (ref 11.5–15.5)
WBC: 11.6 10*3/uL — ABNORMAL HIGH (ref 4.0–10.5)
nRBC: 0 % (ref 0.0–0.2)

## 2022-02-02 LAB — ECHOCARDIOGRAM LIMITED
AV Mean grad: 4 mmHg
AV Peak grad: 6.6 mmHg
Ao pk vel: 1.28 m/s
S' Lateral: 2.6 cm

## 2022-02-02 LAB — RESPIRATORY PANEL BY PCR

## 2022-02-02 LAB — URINALYSIS, ROUTINE W REFLEX MICROSCOPIC
Bilirubin Urine: NEGATIVE
Glucose, UA: NEGATIVE mg/dL
Ketones, ur: NEGATIVE mg/dL
Nitrite: NEGATIVE
Protein, ur: 30 mg/dL — AB
Specific Gravity, Urine: <1.005 — ABNORMAL LOW (ref 1.005–1.030)
pH: 5 (ref 5.0–8.0)

## 2022-02-02 LAB — BLOOD GAS, VENOUS
Acid-Base Excess: 14.2 mmol/L — ABNORMAL HIGH (ref 0.0–2.0)
Acid-Base Excess: 5.6 mmol/L — ABNORMAL HIGH (ref 0.0–2.0)
Bicarbonate: 44.8 mmol/L — ABNORMAL HIGH (ref 20.0–28.0)
Bicarbonate: 36 mmol/L — ABNORMAL HIGH (ref 20.0–28.0)
O2 Saturation: 67.6 %
O2 Saturation: 97.1 %
Patient temperature: 37
Patient temperature: 35.2
pCO2, Ven: 89 mmHg (ref 44–60)
pCO2, Ven: 78 mmHg (ref 44–60)
pH, Ven: 7.31 (ref 7.25–7.43)
pH, Ven: 7.27 (ref 7.25–7.43)
pO2, Ven: 43 mmHg (ref 32–45)
pO2, Ven: 90 mmHg — ABNORMAL HIGH (ref 32–45)

## 2022-02-02 LAB — LACTIC ACID, PLASMA
Lactic Acid, Venous: 0.9 mmol/L (ref 0.5–1.9)
Lactic Acid, Venous: 1 mmol/L (ref 0.5–1.9)

## 2022-02-02 LAB — COMPREHENSIVE METABOLIC PANEL
ALT: 13 U/L (ref 0–44)
AST: 14 U/L — ABNORMAL LOW (ref 15–41)
Albumin: 3.6 g/dL (ref 3.5–5.0)
Alkaline Phosphatase: 52 U/L (ref 38–126)
Anion gap: 6 (ref 5–15)
BUN: 12 mg/dL (ref 8–23)
CO2: 38 mmol/L — ABNORMAL HIGH (ref 22–32)
Calcium: 9 mg/dL (ref 8.9–10.3)
Chloride: 96 mmol/L — ABNORMAL LOW (ref 98–111)
Creatinine, Ser: 0.57 mg/dL (ref 0.44–1.00)
GFR, Estimated: >60 mL/min (ref >60)
Glucose, Bld: 118 mg/dL — ABNORMAL HIGH (ref 70–99)
Potassium: 3.7 mmol/L (ref 3.5–5.1)
Sodium: 140 mmol/L (ref 135–145)
Total Bilirubin: 0.5 mg/dL (ref 0.3–1.2)
Total Protein: 7.4 g/dL (ref 6.5–8.1)

## 2022-02-02 LAB — MRSA NEXT GEN BY PCR, NASAL: MRSA by PCR Next Gen: NOT DETECTED

## 2022-02-02 LAB — SARS CORONAVIRUS 2 BY RT PCR: SARS Coronavirus 2 by RT PCR: NEGATIVE

## 2022-02-02 LAB — TROPONIN I (HIGH SENSITIVITY)
Troponin I (High Sensitivity): 5 ng/L (ref <18)
Troponin I (High Sensitivity): 5 ng/L (ref <18)

## 2022-02-02 LAB — PROTIME-INR
INR: 1.1 (ref 0.8–1.2)
Prothrombin Time: 13.6 seconds (ref 11.4–15.2)

## 2022-02-02 LAB — BRAIN NATRIURETIC PEPTIDE: B Natriuretic Peptide: 26.7 pg/mL (ref 0.0–100.0)

## 2022-02-02 LAB — MAGNESIUM: Magnesium: 1.9 mg/dL (ref 1.7–2.4)

## 2022-02-02 LAB — PROCALCITONIN: Procalcitonin: <0.1 ng/mL

## 2022-02-02 LAB — TSH: TSH: 0.243 u[IU]/mL — ABNORMAL LOW (ref 0.350–4.500)

## 2022-02-02 LAB — D-DIMER, QUANTITATIVE: D-Dimer, Quant: 0.58 ug/mL-FEU — ABNORMAL HIGH (ref 0.00–0.50)

## 2022-02-02 LAB — APTT: aPTT: 30 seconds (ref 24–36)

## 2022-02-02 MED ORDER — ARFORMOTEROL TARTRATE 15 MCG/2ML IN NEBU
15.0000 ug | INHALATION_SOLUTION | Freq: Two times a day (BID) | RESPIRATORY_TRACT | Status: DC
  Administered 2022-02-02 – 2022-02-10 (×17): 15 ug via RESPIRATORY_TRACT
  Filled 2022-02-02 (×17): qty 2

## 2022-02-02 MED ORDER — ORAL CARE MOUTH RINSE
15.0000 mL | OROMUCOSAL | Status: DC
  Administered 2022-02-02 – 2022-02-05 (×11): 15 mL via OROMUCOSAL

## 2022-02-02 MED ORDER — METOPROLOL SUCCINATE ER 25 MG PO TB24
25.0000 mg | ORAL_TABLET | Freq: Every day | ORAL | Status: DC
  Administered 2022-02-02 – 2022-02-10 (×9): 25 mg via ORAL
  Filled 2022-02-02 (×10): qty 1

## 2022-02-02 MED ORDER — ACETAMINOPHEN 325 MG PO TABS
650.0000 mg | ORAL_TABLET | Freq: Four times a day (QID) | ORAL | Status: DC | PRN
  Administered 2022-02-02 – 2022-02-06 (×2): 650 mg via ORAL
  Filled 2022-02-02 (×3): qty 2

## 2022-02-02 MED ORDER — POTASSIUM CHLORIDE CRYS ER 20 MEQ PO TBCR
40.0000 meq | EXTENDED_RELEASE_TABLET | Freq: Once | ORAL | Status: AC
  Administered 2022-02-02: 40 meq via ORAL
  Filled 2022-02-02: qty 2

## 2022-02-02 MED ORDER — IOHEXOL 350 MG/ML SOLN
75.0000 mL | Freq: Once | INTRAVENOUS | Status: AC | PRN
  Administered 2022-02-02: 75 mL via INTRAVENOUS

## 2022-02-02 MED ORDER — SODIUM CHLORIDE 0.9 % IV SOLN
1.0000 g | INTRAVENOUS | Status: AC
  Administered 2022-02-03 – 2022-02-07 (×5): 1 g via INTRAVENOUS
  Filled 2022-02-02 (×6): qty 10

## 2022-02-02 MED ORDER — METHYLPREDNISOLONE SODIUM SUCC 125 MG IJ SOLR
125.0000 mg | Freq: Once | INTRAMUSCULAR | Status: AC
  Administered 2022-02-02: 125 mg via INTRAVENOUS
  Filled 2022-02-02: qty 2

## 2022-02-02 MED ORDER — SODIUM CHLORIDE 0.9 % IV BOLUS
500.0000 mL | Freq: Once | INTRAVENOUS | Status: AC
  Administered 2022-02-02: 500 mL via INTRAVENOUS

## 2022-02-02 MED ORDER — MORPHINE SULFATE (PF) 4 MG/ML IV SOLN
4.0000 mg | INTRAVENOUS | Status: DC | PRN
  Administered 2022-02-03 – 2022-02-08 (×8): 4 mg via INTRAVENOUS
  Filled 2022-02-02 (×9): qty 1

## 2022-02-02 MED ORDER — ONDANSETRON HCL 4 MG/2ML IJ SOLN: 4.0000 mg | Freq: Four times a day (QID) | INTRAMUSCULAR | Status: DC | PRN

## 2022-02-02 MED ORDER — SODIUM CHLORIDE 0.9 % IV SOLN
500.0000 mg | INTRAVENOUS | Status: DC
  Administered 2022-02-03 – 2022-02-04 (×2): 500 mg via INTRAVENOUS
  Filled 2022-02-02 (×2): qty 5

## 2022-02-02 MED ORDER — KETOROLAC TROMETHAMINE 15 MG/ML IJ SOLN
15.0000 mg | Freq: Once | INTRAMUSCULAR | Status: AC
  Administered 2022-02-02: 15 mg via INTRAVENOUS
  Filled 2022-02-02: qty 1

## 2022-02-02 MED ORDER — CHLORHEXIDINE GLUCONATE CLOTH 2 % EX PADS
6.0000 | MEDICATED_PAD | Freq: Every day | CUTANEOUS | Status: DC
  Administered 2022-02-02 – 2022-02-10 (×9): 6 via TOPICAL

## 2022-02-02 MED ORDER — LEVALBUTEROL HCL 1.25 MG/0.5ML IN NEBU
1.2500 mg | INHALATION_SOLUTION | Freq: Four times a day (QID) | RESPIRATORY_TRACT | Status: DC
  Administered 2022-02-02 – 2022-02-07 (×21): 1.25 mg via RESPIRATORY_TRACT
  Filled 2022-02-02 (×21): qty 0.5

## 2022-02-02 MED ORDER — ENOXAPARIN SODIUM 40 MG/0.4ML IJ SOSY: 40.0000 mg | PREFILLED_SYRINGE | INTRAMUSCULAR | Status: DC

## 2022-02-02 MED ORDER — ONDANSETRON HCL 4 MG/2ML IJ SOLN
4.0000 mg | Freq: Once | INTRAMUSCULAR | Status: AC
  Administered 2022-02-02: 4 mg via INTRAVENOUS
  Filled 2022-02-02: qty 2

## 2022-02-02 MED ORDER — ALPRAZOLAM 0.5 MG PO TABS
0.5000 mg | ORAL_TABLET | Freq: Once | ORAL | Status: AC
  Administered 2022-02-02: 0.5 mg via ORAL
  Filled 2022-02-02: qty 1

## 2022-02-02 MED ORDER — PREDNISONE 20 MG PO TABS
40.0000 mg | ORAL_TABLET | Freq: Every day | ORAL | Status: AC
  Administered 2022-02-03 – 2022-02-07 (×5): 40 mg via ORAL
  Filled 2022-02-02 (×5): qty 2

## 2022-02-02 MED ORDER — APIXABAN 5 MG PO TABS
5.0000 mg | ORAL_TABLET | Freq: Two times a day (BID) | ORAL | Status: DC
  Administered 2022-02-02 – 2022-02-10 (×17): 5 mg via ORAL
  Filled 2022-02-02 (×17): qty 1

## 2022-02-02 MED ORDER — ORAL CARE MOUTH RINSE: 15.0000 mL | OROMUCOSAL | Status: DC | PRN

## 2022-02-02 MED ORDER — DILTIAZEM HCL-DEXTROSE 125-5 MG/125ML-% IV SOLN (PREMIX)
5.0000 mg/h | INTRAVENOUS | Status: DC
  Administered 2022-02-02: 7.5 mg/h via INTRAVENOUS
  Administered 2022-02-02: 5 mg/h via INTRAVENOUS
  Filled 2022-02-02 (×2): qty 125

## 2022-02-02 MED ORDER — ONDANSETRON HCL 4 MG PO TABS: 4.0000 mg | ORAL_TABLET | Freq: Four times a day (QID) | ORAL | Status: DC | PRN

## 2022-02-02 MED ORDER — MORPHINE SULFATE (PF) 4 MG/ML IV SOLN
4.0000 mg | Freq: Once | INTRAVENOUS | Status: AC
  Administered 2022-02-02: 4 mg via INTRAVENOUS
  Filled 2022-02-02: qty 1

## 2022-02-02 MED ORDER — SODIUM CHLORIDE 0.9 % IV SOLN
1.0000 g | Freq: Once | INTRAVENOUS | Status: AC
  Administered 2022-02-02: 1 g via INTRAVENOUS
  Filled 2022-02-02: qty 10

## 2022-02-02 MED ORDER — PANTOPRAZOLE SODIUM 40 MG PO TBEC
40.0000 mg | DELAYED_RELEASE_TABLET | Freq: Every day | ORAL | Status: DC
  Administered 2022-02-02 – 2022-02-10 (×9): 40 mg via ORAL
  Filled 2022-02-02 (×9): qty 1

## 2022-02-02 MED ORDER — MAGNESIUM SULFATE 2 GM/50ML IV SOLN
2.0000 g | Freq: Once | INTRAVENOUS | Status: AC
  Administered 2022-02-02: 2 g via INTRAVENOUS
  Filled 2022-02-02: qty 50

## 2022-02-02 MED ORDER — BUDESONIDE 0.25 MG/2ML IN SUSP
0.2500 mg | Freq: Two times a day (BID) | RESPIRATORY_TRACT | Status: DC
  Administered 2022-02-02 – 2022-02-07 (×11): 0.25 mg via RESPIRATORY_TRACT
  Filled 2022-02-02 (×11): qty 2

## 2022-02-02 MED ORDER — ASPIRIN 81 MG PO TBEC
81.0000 mg | DELAYED_RELEASE_TABLET | Freq: Every day | ORAL | Status: DC
  Administered 2022-02-02 – 2022-02-10 (×9): 81 mg via ORAL
  Filled 2022-02-02 (×9): qty 1

## 2022-02-02 MED ORDER — ACETAMINOPHEN 650 MG RE SUPP: 650.0000 mg | Freq: Four times a day (QID) | RECTAL | Status: DC | PRN

## 2022-02-02 MED ORDER — IPRATROPIUM BROMIDE 0.02 % IN SOLN
0.5000 mg | Freq: Four times a day (QID) | RESPIRATORY_TRACT | Status: DC
  Administered 2022-02-02: 0.5 mg via RESPIRATORY_TRACT
  Filled 2022-02-02: qty 2.5

## 2022-02-02 MED ORDER — EXEMESTANE 25 MG PO TABS
12.5000 mg | ORAL_TABLET | Freq: Every day | ORAL | Status: DC
  Administered 2022-02-02 – 2022-02-10 (×9): 12.5 mg via ORAL
  Filled 2022-02-02 (×11): qty 1

## 2022-02-02 MED ORDER — LEVALBUTEROL HCL 0.63 MG/3ML IN NEBU: 0.6300 mg | INHALATION_SOLUTION | Freq: Four times a day (QID) | RESPIRATORY_TRACT | Status: DC | PRN

## 2022-02-02 MED ORDER — SODIUM CHLORIDE 0.9 % IV SOLN
500.0000 mg | Freq: Once | INTRAVENOUS | Status: DC
  Filled 2022-02-02: qty 5

## 2022-02-02 MED ORDER — REVEFENACIN 175 MCG/3ML IN SOLN
175.0000 ug | Freq: Every day | RESPIRATORY_TRACT | Status: DC
  Administered 2022-02-02 – 2022-02-05 (×4): 175 ug via RESPIRATORY_TRACT
  Filled 2022-02-02 (×5): qty 3

## 2022-02-02 MED ORDER — METOPROLOL TARTRATE 25 MG PO TABS
25.0000 mg | ORAL_TABLET | Freq: Two times a day (BID) | ORAL | Status: DC
  Filled 2022-02-02: qty 1

## 2022-02-02 NOTE — Consult Note (Addendum)
NAME:  Caroline Greer, MRN:  [id number], DOB:  [birth year], LOS: 0 ADMISSION DATE:  02/02/2022, CONSULTATION DATE:  02/02/22 REFERRING MD:  Olevia Bowens, CHIEF COMPLAINT:  chest/breast pain   History of Present Illness:  Caroline Greer is a 66 y.o. 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  She wears 4L [place] O2 at baseline, denies any recent cough, increased sputum production, fever or other URI symptoms.  She states that she had the sudden onset of severe R breast pain that is radiating to her R chest wall yesterday.  The pain is so intense it caused shortness of breath.  She was treated with Keflex for cellulitis of the R breast during her hospitalization for COPD exacerbation and pleural effusion requiring thoracentesis last month and her symptoms improved.  The pain this time is much more severe.   ED course included CTA chest which was negative for PE, showed improved pleural effusions, diffuse pulmonary septal thickening.   Her HR converted to afib with RVR and she was started on Cardizem gtt and Bipap and given Ceftriaxone/azithromycin.  Labs showed Troponin of 5, BNP 26, procal <0.10, lactic acid 1.0, WBC 11.6.   PCCM consulted in this setting  Pertinent  Medical History   has a past medical history of Acute respiratory failure with hypoxia ([place]) (09/30/2018), Breast cancer ([place]), Constipation, COPD exacerbation ([place]) (01/05/2019), Depression with anxiety (09/30/2018), Dyspnea, Essential hypertension (09/30/2018), History of radiation therapy, Hyperlipidemia (02/02/2022), Hypertension, and Tobacco dependence due to cigarettes (09/30/2018).   Significant Hospital Events: Including procedures, antibiotic start and stop dates in addition to other pertinent events   9/12 admit to stepdown with hypoxic  respiratory failure, PCCM consult  Interim History / Subjective:  Pt's saturations improved Remains in RVR Complaining of severe R breast pain  Objective   Blood pressure 94/70, pulse (!) 121, temperature 97.7 F (36.5 C), temperature source Oral, resp. rate (!) 23, SpO2 96 %.    FiO2 (%):  [100 %] 100 %  No intake or output data in the 24 hours ending 02/02/22 0954 There were no vitals filed for this visit.   General:  uncomfortable-appearing F, restless on Bipap HEENT: MM pink/moist, sclera anicteric, bipap mask in place Neuro: awake, alert, oriented and following commands CV: s1s2 tacycardic, irregular, no m/r/g Breast: R breast tender and erythematous with mild skin induration of the R nipple and RUQ/RLQ, no fluctuance appreciated, no nipple drainage PULM:  tachypneic on Bipap, no wheezing, scattered bilateral basilar rhonchi GI: soft, non-tender Extremities: warm/dry, no edema  Skin: no rashes or lesions    Resolved Hospital Problem list     Assessment & Plan:    Acute on Chronic Hypoxic Respiratory Failure likely secondary to COPD exacerbation and difficult inspiration due to severe R breast and chest wall pain Baseline COPD Tobacco Use Sees Dr. [name], PFT's pending, on Breztri, flonase, proair at home Covid neg -continue Bipap with breaks for po's and as tolerated -received solumedrol in the ED, no current significant wheezing, continue prednisone 41m -empiric CAP coverage with Rocephin and Azithromycin -Brovana, pulmicort and Yupelri nebs -respiratory and blood cultures pending -check RVP      R breast pain History of R breast Ca Completed radiation in May, currently on Exemestane Breast is tender and erythematous -R breast [place]-continue Ceftriaxone -  pain control with morphine   Atrial Fibrillation New onset, no hypotension -check TSH -continue Cardizem -replete electrolytes prn -hospitalist to consult cardiology -last echo 11/2021 with EF  60-65% and grade I diastolic dysfunction  Best Practice (right click and "Reselect all SmartList Selections" daily)   Diet/type: NPO w/ oral meds DVT prophylaxis: LMWH GI prophylaxis: N/A Lines: N/A Foley:  N/A Code Status:  full code Last date of multidisciplinary goals of care discussion [pending]  Labs   CBC: Recent Labs  Lab 02/02/22 0131  WBC 11.6*  NEUTROABS 8.1*  HGB 13.2  HCT 42.6  MCV 96.2  PLT 297    Basic Metabolic Panel: Recent Labs  Lab 02/02/22 0131 02/02/22 0332  NA 140  --   K 3.7  --   CL 96*  --   CO2 38*  --   GLUCOSE 118*  --   BUN 12  --   CREATININE 0.57  --   CALCIUM 9.0  --   MG  --  1.9   GFR: Estimated Creatinine Clearance: 67.6 mL/min (by C-G formula based on SCr of 0.57 mg/dL). Recent Labs  Lab 02/02/22 0131 02/02/22 0331 02/02/22 0340  PROCALCITON  --   --  <0.10  WBC 11.6*  --   --   LATICACIDVEN 0.9 1.0  --     Liver Function Tests: Recent Labs  Lab 02/02/22 0131  AST 14*  ALT 13  ALKPHOS 52  BILITOT 0.5  PROT 7.4  ALBUMIN 3.6   No results for input(s): "LIPASE", "AMYLASE" in the last 168 hours. No results for input(s): "AMMONIA" in the last 168 hours.  ABG    Component Value Date/Time   PHART 7.327 (L) 10/02/2019 0832   PCO2ART 60.1 (H) 10/02/2019 0832   PO2ART 103 10/02/2019 0832   HCO3 44.8 (H) 02/02/2022 0150   TCO2 32 10/02/2019 0836   O2SAT 67.6 02/02/2022 0150     Coagulation Profile: Recent Labs  Lab 02/02/22 0131  INR 1.1    Cardiac Enzymes: No results for input(s): "CKTOTAL", "CKMB", "CKMBINDEX", "TROPONINI" in the last 168 hours.  HbA1C: No results found for: "HGBA1C"  CBG: No results for input(s): "GLUCAP" in the last 168 hours.  Review of Systems:   Please see the history of present illness. All other systems reviewed and are negative    Past Medical History:  She,  has a past medical history of Acute respiratory failure with hypoxia ([place]) (09/30/2018), Breast cancer ([place]),  Constipation, COPD exacerbation ([place]) (01/05/2019), Depression with anxiety (09/30/2018), Dyspnea, Essential hypertension (09/30/2018), History of radiation therapy, Hyperlipidemia (02/02/2022), Hypertension, and Tobacco dependence due to cigarettes (09/30/2018).   Surgical History:   Past Surgical History:  Procedure Laterality Date   BREAST LUMPECTOMY WITH RADIOACTIVE SEED AND SENTINEL LYMPH NODE BIOPSY Right 07/23/2021   Procedure: RIGHT BREAST LUMPECTOMY WITH RADIOACTIVE SEED AND SENTINEL LYMPH NODE BIOPSY;  Surgeon: Jovita Kussmaul, MD;  Location: [facility];  Service: General;  Laterality: Right;   HAND SURGERY Right 1997   Hand Fracture (right thumb & hand)   LAPAROTOMY  1974   RIGHT/LEFT HEART CATH AND CORONARY ANGIOGRAPHY N/A 10/02/2019   Procedure: RIGHT/LEFT HEART CATH AND CORONARY ANGIOGRAPHY;  Surgeon: Sherren Mocha, MD;  Location: [facility];  Service: Cardiovascular;  Laterality: N/A;   THORACENTESIS N/A 01/12/2022   Procedure: THORACENTESIS;  Surgeon: Juanito Doom, MD;  Location: [facility];  Service: Cardiopulmonary;  Laterality: N/A;     Social History:   reports that she  has quit smoking. Her smoking use included cigarettes. She has a 34.40 pack-year smoking history. She has never used smokeless tobacco. She reports current alcohol use. She reports that she does not currently use drugs after having used the following drugs: Marijuana.   Family History:  Her family history includes COPD in her mother; Esophageal cancer in her maternal aunt and maternal grandmother; Parkinson's disease in an other family member; Prostate cancer in some other family members.   Allergies Allergies  Allergen Reactions   Crab [Shellfish Allergy] Anaphylaxis and Swelling    Dungeness crab   Erythromycin Other (See Comments)    convulsion   Hydrocodone Nausea And Vomiting    Severe vomiting      Home Medications  Prior to Admission medications   Medication Sig Start Date End  Date Taking? Authorizing Provider  acetaminophen (TYLENOL) 500 MG tablet Take 1,000 mg by mouth daily as needed (pain).   Yes [provider]  ALPRAZolam Duanne Moron) 0.5 MG tablet Take 0.5 mg by mouth at bedtime as needed for sleep. 10/31/18  Yes [provider]  amLODipine (NORVASC) 10 MG tablet Take 10 mg by mouth daily. 11/09/21  Yes [provider]  aspirin EC 81 MG tablet Take 81 mg by mouth daily.   Yes [provider]  Budeson-Glycopyrrol-Formoterol (BREZTRI AEROSPHERE) 160-9-4.8 MCG/ACT AERO Inhale 2 puffs into the lungs in the morning and at bedtime. 01/11/22  Yes Icard, Octavio Graves, DO  Cholecalciferol (VITAMIN D3) 25 MCG (1000 UT) CAPS Take 1,000 Units by mouth daily.   Yes [provider]  exemestane (AROMASIN) 25 MG tablet Take 0.5 tablets (12.5 mg total) by mouth daily after breakfast. 12/21/21  Yes Nicholas Lose, MD  fluticasone (FLONASE) 50 MCG/ACT nasal spray Place 2 sprays into both nostrils 2 (two) times daily.   Yes [provider]  guaiFENesin (MUCINEX) 600 MG 12 hr tablet Take 600 mg by mouth 2 (two) times daily as needed for to loosen phlegm.   Yes [provider]  metoprolol tartrate (LOPRESSOR) 25 MG tablet Take 1 tablet (25 mg total) by mouth 2 (two) times daily. 06/12/21  Yes Loel Dubonnet, NP  pantoprazole (PROTONIX) 40 MG tablet Take 1 tablet (40 mg total) by mouth daily at 6 (six) AM. 12/16/21  Yes Eugenie Filler, MD  PROAIR HFA 108 [phone] Base) MCG/ACT inhaler Inhale 2 puffs into the lungs every 6 (six) hours as needed for wheezing or shortness of breath. 90 day supply 01/11/22  Yes Icard, Bradley L, DO  vitamin B-12 (CYANOCOBALAMIN) 500 MCG tablet Take 500 mcg by mouth daily.   Yes [provider]  Vitamin E Skin OIL Apply 1 Application topically daily.   Yes [provider]  atorvastatin (LIPITOR) 10 MG tablet Take 1 tablet (10 mg total) by mouth daily. Patient not taking: Reported on 01/19/2022  06/26/21   Sueanne Margarita, MD  furosemide (LASIX) 40 MG tablet Take 1 tablet (40 mg total) by mouth daily as needed. Take 1 tablet daily x 2 days, then as needed. Patient not taking: Reported on 01/19/2022 12/15/21   Eugenie Filler, MD  ipratropium-albuterol (DUONEB) 0.5-2.5 (3) MG/3ML SOLN Use 1 vial via nebulizer 2 times daily for 3 days then 2 times daily as need.(shortness of breath) Patient not taking: Reported on 01/19/2022 12/15/21   Eugenie Filler, MD  loratadine (CLARITIN) 10 MG tablet Take 1 tablet (10 mg total) by mouth daily. Patient not taking: Reported on 01/19/2022 12/16/21  Eugenie Filler, MD  magnesium hydroxide (MILK OF MAGNESIA) 400 MG/5ML suspension Take 15 mLs by mouth daily as needed for mild constipation. Patient not taking: Reported on 01/19/2022    [provider]  nystatin (MYCOSTATIN) 100000 UNIT/ML suspension Use as directed 4 mLs in the mouth or throat 4 (four) times daily as needed. thrush Patient not taking: Reported on 01/19/2022 12/21/21   [provider]  OXYGEN Inhale 3 L into the lungs continuous.    [provider]  polyethylene glycol (MIRALAX / GLYCOLAX) 17 g packet Take 17 g by mouth daily. Patient not taking: Reported on 01/19/2022 01/07/19   Arrien, Jimmy Picket, MD     Critical care time: 35 minutes    CRITICAL CARE Performed by: Otilio Carpen Laymon Stockert   Total critical care time: 35 minutes  Critical care time was exclusive of separately billable procedures and treating other patients.  Critical care was necessary to treat or prevent imminent or life-threatening deterioration.  Critical care was time spent personally by me on the following activities: development of treatment plan with patient and/or surrogate as well as nursing, discussions with consultants, evaluation of patient's response to treatment, examination of patient, obtaining history from patient or surrogate, ordering and performing treatments and  interventions, ordering and review of laboratory studies, ordering and review of radiographic studies, pulse oximetry and re-evaluation of patient's condition.   Otilio Carpen Jaiceon Collister, PA-C [facility] See Amion for pager If no response to pager , please call [phone] until 7pm After 7:00 pm call Elink  [phone]?[place]

## 2022-02-02 NOTE — ED Provider Notes (Addendum)
[facility] DEPT Provider Note   CSN: [id number] Arrival date & time: 02/02/22  0126     History  Chief Complaint  Patient presents with   Chest Pain    Caroline Greer is a 66 y.o. female.  Patient presents to the emergency department for evaluation of shortness of breath with right-sided chest pain.  She reports symptoms began earlier today and have progressively worsened.  She comes to the ER by EMS.  EMS reports severe hypoxia upon their arrival, administered DuoNeb without improvement.  She does report COPD, uses oxygen at night.  Patient reports a history of breast cancer.       Home Medications Prior to Admission medications   Medication Sig Start Date End Date Taking? Authorizing Provider  acetaminophen (TYLENOL) 500 MG tablet Take 1,000 mg by mouth daily as needed (pain).   Yes [provider]  ALPRAZolam Duanne Moron) 0.5 MG tablet Take 0.5 mg by mouth at bedtime as needed for sleep. 10/31/18  Yes [provider]  amLODipine (NORVASC) 10 MG tablet Take 10 mg by mouth daily. 11/09/21  Yes [provider]  aspirin EC 81 MG tablet Take 81 mg by mouth daily.   Yes [provider]  Budeson-Glycopyrrol-Formoterol (BREZTRI AEROSPHERE) 160-9-4.8 MCG/ACT AERO Inhale 2 puffs into the lungs in the morning and at bedtime. 01/11/22  Yes Icard, Octavio Graves, DO  Cholecalciferol (VITAMIN D3) 25 MCG (1000 UT) CAPS Take 1,000 Units by mouth daily.   Yes [provider]  exemestane (AROMASIN) 25 MG tablet Take 0.5 tablets (12.5 mg total) by mouth daily after breakfast. 12/21/21  Yes Nicholas Lose, MD  fluticasone (FLONASE) 50 MCG/ACT nasal spray Place 2 sprays into both nostrils 2 (two) times daily.   Yes [provider]  guaiFENesin (MUCINEX) 600 MG 12 hr tablet Take 600 mg by mouth 2 (two) times daily as needed for to loosen phlegm.   Yes [provider]  metoprolol tartrate (LOPRESSOR) 25 MG tablet Take 1  tablet (25 mg total) by mouth 2 (two) times daily. 06/12/21  Yes Loel Dubonnet, NP  pantoprazole (PROTONIX) 40 MG tablet Take 1 tablet (40 mg total) by mouth daily at 6 (six) AM. 12/16/21  Yes Eugenie Filler, MD  PROAIR HFA 108 [phone] Base) MCG/ACT inhaler Inhale 2 puffs into the lungs every 6 (six) hours as needed for wheezing or shortness of breath. 90 day supply 01/11/22  Yes Icard, Bradley L, DO  vitamin B-12 (CYANOCOBALAMIN) 500 MCG tablet Take 500 mcg by mouth daily.   Yes [provider]  Vitamin E Skin OIL Apply 1 Application topically daily.   Yes [provider]  atorvastatin (LIPITOR) 10 MG tablet Take 1 tablet (10 mg total) by mouth daily. Patient not taking: Reported on 01/19/2022 06/26/21   Sueanne Margarita, MD  furosemide (LASIX) 40 MG tablet Take 1 tablet (40 mg total) by mouth daily as needed. Take 1 tablet daily x 2 days, then as needed. Patient not taking: Reported on 01/19/2022 12/15/21   Eugenie Filler, MD  ipratropium-albuterol (DUONEB) 0.5-2.5 (3) MG/3ML SOLN Use 1 vial via nebulizer 2 times daily for 3 days then 2 times daily as need.(shortness of breath) Patient not taking: Reported on 01/19/2022 12/15/21   Eugenie Filler, MD  loratadine (CLARITIN) 10 MG tablet Take 1 tablet (10 mg total) by mouth daily. Patient not taking: Reported on 01/19/2022 12/16/21   Eugenie Filler, MD  magnesium hydroxide (MILK OF MAGNESIA) 400  MG/5ML suspension Take 15 mLs by mouth daily as needed for mild constipation. Patient not taking: Reported on 01/19/2022    [provider]  nystatin (MYCOSTATIN) 100000 UNIT/ML suspension Use as directed 4 mLs in the mouth or throat 4 (four) times daily as needed. thrush Patient not taking: Reported on 01/19/2022 12/21/21   [provider]  OXYGEN Inhale 3 L into the lungs continuous.    [provider]  polyethylene glycol (MIRALAX / GLYCOLAX) 17 g packet Take 17 g by mouth daily. Patient not taking: Reported  on 01/19/2022 01/07/19   Arrien, Jimmy Picket, MD      Allergies    Otho Darner allergy], Erythromycin, and Hydrocodone    Review of Systems   Review of Systems  Physical Exam Updated Vital Signs BP 114/66   Pulse (!) 106   Temp 97.7 F (36.5 C) (Oral)   Resp 20   SpO2 100%  Physical Exam Vitals and nursing note reviewed.  Constitutional:      General: She is not in acute distress.    Appearance: She is well-developed.  HENT:     Head: Normocephalic and atraumatic.     Mouth/Throat:     Mouth: Mucous membranes are moist.  Eyes:     General: Vision grossly intact. Gaze aligned appropriately.     Extraocular Movements: Extraocular movements intact.     Conjunctiva/sclera: Conjunctivae normal.  Cardiovascular:     Rate and Rhythm: Regular rhythm. Tachycardia present.     Pulses: Normal pulses.     Heart sounds: Normal heart sounds, S1 normal and S2 normal. No murmur heard.    No friction rub. No gallop.  Pulmonary:     Effort: Tachypnea, accessory muscle usage and respiratory distress present.     Breath sounds: Normal breath sounds. Decreased air movement present.  Chest:     Chest wall: Tenderness present.  Abdominal:     General: Bowel sounds are normal.     Palpations: Abdomen is soft.     Tenderness: There is no abdominal tenderness. There is no guarding or rebound.     Hernia: No hernia is present.  Musculoskeletal:        General: No swelling.     Cervical back: Full passive range of motion without pain, normal range of motion and neck supple. No spinous process tenderness or muscular tenderness. Normal range of motion.     Right lower leg: No edema.     Left lower leg: Edema present.  Skin:    General: Skin is warm and dry.     Capillary Refill: Capillary refill takes less than 2 seconds.     Findings: No ecchymosis, erythema, rash or wound.  Neurological:     General: No focal deficit present.     Mental Status: She is alert and oriented to person,  place, and time.     GCS: GCS eye subscore is 4. GCS verbal subscore is 5. GCS motor subscore is 6.     Cranial Nerves: Cranial nerves 2-12 are intact.     Sensory: Sensation is intact.     Motor: Motor function is intact.     Coordination: Coordination is intact.  Psychiatric:        Attention and Perception: Attention normal.        Mood and Affect: Mood normal.        Speech: Speech normal.        Behavior: Behavior normal.     ED Results /  Procedures / Treatments   Labs (all labs ordered are listed, but only abnormal results are displayed) Labs Reviewed  COMPREHENSIVE METABOLIC PANEL - Abnormal; Notable for the following components:      Result Value   Chloride 96 (*)    CO2 38 (*)    Glucose, Bld 118 (*)    AST 14 (*)    All other components within normal limits  CBC WITH DIFFERENTIAL/PLATELET - Abnormal; Notable for the following components:   WBC 11.6 (*)    RDW 15.8 (*)    Neutro Abs 8.1 (*)    All other components within normal limits  BLOOD GAS, VENOUS - Abnormal; Notable for the following components:   pCO2, Ven 89 (*)    Bicarbonate 44.8 (*)    Acid-Base Excess 14.2 (*)    All other components within normal limits  D-DIMER, QUANTITATIVE - Abnormal; Notable for the following components:   D-Dimer, Quant 0.58 (*)    All other components within normal limits  SARS CORONAVIRUS 2 BY RT PCR  CULTURE, BLOOD (ROUTINE X 2)  CULTURE, BLOOD (ROUTINE X 2)  URINE CULTURE  LACTIC ACID, PLASMA  LACTIC ACID, PLASMA  PROTIME-INR  APTT  URINALYSIS, ROUTINE W REFLEX MICROSCOPIC  BRAIN NATRIURETIC PEPTIDE  I-STAT CHEM 8, ED  TROPONIN I (HIGH SENSITIVITY)  TROPONIN I (HIGH SENSITIVITY)    EKG EKG Interpretation  Date/Time:  Tuesday February 02 2022 01:36:20 EDT Ventricular Rate:  96 PR Interval:  141 QRS Duration: 99 QT Interval:  372 QTC Calculation: 471 R Axis:   104 Text Interpretation: Sinus rhythm Multiple premature complexes, vent & supraven Probable right  ventricular hypertrophy Borderline ST elevation, lateral leads Confirmed by Orpah Greek [phone]) on 02/02/2022 1:39:07 AM  Radiology CT Angio Chest Pulmonary Embolism (PE) W or WO Contrast  Result Date: 02/02/2022 CLINICAL DATA:  66 year old female with right side chest pain radiating to the back, shortness of breath. Hypoxic at presentation. EXAM: CT ANGIOGRAPHY CHEST WITH CONTRAST TECHNIQUE: Multidetector CT imaging of the chest was performed using the standard protocol during bolus administration of intravenous contrast. Multiplanar CT image reconstructions and MIPs were obtained to evaluate the vascular anatomy. RADIATION DOSE REDUCTION: This exam was performed according to the departmental dose-optimization program which includes automated exposure control, adjustment of the mA and/or kV according to patient size and/or use of iterative reconstruction technique. CONTRAST:  46m OMNIPAQUE IOHEXOL 350 MG/ML SOLN COMPARISON:  Portable chest 0139 hours today.  CTA chest 01/10/2022. FINDINGS: Cardiovascular: Good contrast bolus timing in the pulmonary arterial tree. Mild respiratory motion. No pulmonary artery filling defect identified. Some central pulmonary artery enlargement (series 5, image 117). Calcified coronary artery atherosclerosis and/or stents. Calcified aortic atherosclerosis. No cardiomegaly or pericardial effusion. Mediastinum/Nodes: Reactive appearing mediastinal and hilar lymph nodes appear stable from last month. No mediastinal mass. Right axillary and superior breast surgical clips. Lungs/Pleura: Regressed but not completely resolved bilateral pleural effusions since last month. Enhancing lung base atelectasis greater on the left. Similar lung volumes with increased diffuse pulmonary septal thickening since last month (series 6, image 36). Major airways remain patent. No consolidation. Regressed lateral segment right middle lobe atelectasis, but increased lingula atelectasis since  last month. Upper Abdomen: Negative visible liver, spleen, adrenal glands, kidneys and stomach in the upper abdomen. No upper abdominal free air or free fluid. Musculoskeletal: Stable.  No acute osseous abnormality identified. Review of the MIP images confirms the above findings. IMPRESSION: 1. Negative for acute pulmonary embolus. 2. Increased diffuse pulmonary  septal thickening since last month. Regressed but not completely resolved bilateral pleural effusions. Stable reactive appearing mediastinal lymph nodes. Consider acute pulmonary edema versus viral/atypical respiratory infection. Chronic interstitial lung disease felt less likely. 3. Calcified coronary artery and Aortic Atherosclerosis (ICD10-I70.0). Electronically Signed   By: Genevie Ann M.D.   On: 02/02/2022 06:05   DG Chest Port 1 View  Result Date: 02/02/2022 CLINICAL DATA:  Sepsis EXAM: PORTABLE CHEST 1 VIEW COMPARISON:  01/13/2022 FINDINGS: The heart size and mediastinal contours are within normal limits. Both lungs are clear. The visualized skeletal structures are unremarkable. Lungs are hyperinflated. IMPRESSION: No active disease. Electronically Signed   By: Ulyses Jarred M.D.   On: 02/02/2022 01:49    Procedures Procedures    Medications Ordered in ED Medications  methylPREDNISolone sodium succinate (SOLU-MEDROL) 125 mg/2 mL injection 125 mg (has no administration in time range)  cefTRIAXone (ROCEPHIN) 1 g in sodium chloride 0.9 % 100 mL IVPB (has no administration in time range)  morphine (PF) 4 MG/ML injection 4 mg (4 mg Intravenous Given 02/02/22 0245)  ondansetron (ZOFRAN) injection 4 mg (4 mg Intravenous Given 02/02/22 0239)  morphine (PF) 4 MG/ML injection 4 mg (4 mg Intravenous Given 02/02/22 0525)  iohexol (OMNIPAQUE) 350 MG/ML injection 75 mL (75 mLs Intravenous Contrast Given 02/02/22 0534)    ED Course/ Medical Decision Making/ A&P                           Medical Decision Making Amount and/or Complexity of Data  Reviewed Labs: ordered. Radiology: ordered. ECG/medicine tests: ordered.  Risk Prescription drug management. Decision regarding hospitalization.   Presents to the emergency department for evaluation of shortness of breath.  Patient comes to the ER by ambulance.  EMS report oxygen saturations in the 40s upon their arrival.  Patient complaining of significant right-sided chest pain.  Pain radiates into her back.  Patient appears uncomfortable at arrival.  Patient does have a history of COPD.  Minimal wheezing at arrival but she is receiving a DuoNeb per EMS.  Patient reports history of breast cancer.  Patient reports that she has had increased problems with her breathing and pain since she underwent radiation therapy.  Patient has been hospitalized twice in the last couple of months for COPD exacerbation.  She has had a right-sided pleural effusion that was drained at her most recent visit.  Chest x-ray performed at arrival.  No recurrence of pleural effusion.  Patient splinting on arrival because of pain.  Breathing improved with morphine.  Patient with cancer diagnosis, tachycardia, shortness of breath not explained by her chest x-ray.  PE must be considered.  D-dimer elevated.  She has undergone CT angiography each of her last 2 hospitalizations but must be repeated to rule out PE as a cause of her significant pain.  CT does not show PE but there is increased pulmonary septal thickening, possible atypical infection versus edema.  With her COPD history, will treat empirically with antibiotics.  Patient did have elevated PCO2 on her venous blood gas.  I suspect some of this was secondary to her significant splinting and ventilation has improved with analgesia.  For this reason I did not initially start BiPAP.  Patient was given IV analgesia and she appears more comfortable but is still tachypneic.  Given the possibility for edema versus pneumonia on the CT scan, will ask respiratory to evaluate  patient for BiPAP.  Patient will require hospitalization for further  management of COPD exacerbation and possible atypical pneumonia.  Addendum: After I had discussed the patient with Dr. Myna Hidalgo, hospitalist and she had admission orders written, I was called to the room by nursing staff.  Patient now tachycardic.  EKG shows atrial fibrillation with rapid ventricular response.  I have looked through her records, she has not previously had this diagnosis.  Blood pressure borderline around 282 systolic after morphine.  Reviewing her records, she has an ejection fraction of 60 to 65%.  She did tolerate fluid bolus.  Will start Cardizem drip without IV bolus, monitor heart rate.  CRITICAL CARE Performed by: Orpah Greek   Total critical care time: 34 minutes  Critical care time was exclusive of separately billable procedures and treating other patients.  Critical care was necessary to treat or prevent imminent or life-threatening deterioration.  Critical care was time spent personally by me on the following activities: development of treatment plan with patient and/or surrogate as well as nursing, discussions with consultants, evaluation of patient's response to treatment, examination of patient, obtaining history from patient or surrogate, ordering and performing treatments and interventions, ordering and review of laboratory studies, ordering and review of radiographic studies, pulse oximetry and re-evaluation of patient's condition.         Final Clinical Impression(s) / ED Diagnoses Final diagnoses:  Chest wall pain  COPD exacerbation [facility])    Rx / DC Orders ED Discharge Orders     None         Audyn Dimercurio, Gwenyth Allegra, MD 02/02/22 0601    Orpah Greek, MD 02/02/22 5615    Orpah Greek, MD 02/02/22 [phone]

## 2022-02-02 NOTE — Consult Note (Signed)
Cardiology Consultation   Patient ID: Caroline Greer MRN: [id number]; DOB: Mar 19, 1956  Admit date: 02/02/2022 Date of Consult: 02/02/2022  PCP:  [name], [place] [facility] Cardiologist:  [name], MD     Patient Profile:   Caroline Greer is a 66 y.o. female with a hx of COPD on home oxygen, history of acute respiratory failure with hypoxemia, breast cancer, history of radiation therapy, constipation, depression, anxiety, HTN, HLD, tobacco use who is being seen 02/02/2022 for the evaluation of new onset atrial fibrillation at the request of Dr. Olevia Bowens.  History of Present Illness:   Caroline Greer is a 66 year old female with above medical history who is followed by Dr. Radford Pax.  Patient is significant COPD and is on home oxygen.  Per chart review, patient was referred to Dr. Radford Pax in 07/2019 for evaluation of coronary calcifications on chest CT.  For further evaluation, patient underwent echocardiogram on 08/08/2019 that showed EF 65-70%, no regional wall motion abnormalities, mild LVH, grade 1 diastolic dysfunction, trivial mitral regurgitation, mild-moderately thickened aortic valve without aortic stenosis.  She also underwent coronary CTA on 09/20/2019 that showed a coronary calcium score of 1467 (99th percentile), moderate-severe atherosclerosis with multivessel CAD.  Cardiac catheterization was recommended.  She underwent a left/right heart catheterization on 10/02/2019 that showed mild-moderate multivessel nonobstructive CAD with 50% stenosis of the mid RCA, 30-40% stenosis of the proximal to mid LAD, 50% diagonal ostial stenosis, 40-50% stenosis of the left circumflex/first OM bifurcation.  Recommended medical therapy  Patient had a follow-up echocardiogram completed on 11/13/2020 that showed LVEF 65-70%, mild LVH, grade 2 diastolic dysfunction, normal RV systolic function, mildly elevated pulmonary artery systolic pressure.  Her most recent echocardiogram was completed  on 12/15/2021 and showed EF 60-65%, no regional wall motion abnormalities, mild LVH with grade 1 diastolic dysfunction.  Patient presented to the ED on 9/12 complaining of right-sided breast pain that radiated to the back/chest wall, shortness of breath.  Reportedly had O2 sats in the 40s when EMS arrived.  Patient refused ABG, was started on BiPAP. Labs in the ED showed Na 140, K 3.7, creatinine 0.57, WBC 11.6, hemoglobin 13.2, platelets 213. hsTn 5>>5. BNP 26.7.   Initial EKG showed sinus rhythm with PACs and PVCs, HR 96BPM. Follow up EKG showed atrial fibrillation with HR 124 BPM. CXR showed no active disease. CTA chest showed no PE, increased pulmonary septal thickening.   My interview was limited as patient was wearing bi-pap. Patient did endorse right breast pain, but denied any chest pain or left breast pain. Denied any palpitations. Did have some dizziness, denied syncope or near syncope. Bi-pap machine was very uncomfortable and was causing her to have a headache/pain in her nose.    Past Medical History:  Diagnosis Date   Acute respiratory failure with hypoxia ([place]) 09/30/2018   Breast cancer ([place])    Constipation    COPD exacerbation ([place]) 01/05/2019   Depression with anxiety 09/30/2018   Dyspnea    Essential hypertension 09/30/2018   History of radiation therapy    Right breast- 09/01/21-10/16/21- Dr. Gery Pray   Hyperlipidemia 02/02/2022   Hypertension    Tobacco dependence due to cigarettes 09/30/2018    Past Surgical History:  Procedure Laterality Date   BREAST LUMPECTOMY WITH RADIOACTIVE SEED AND SENTINEL LYMPH NODE BIOPSY Right 07/23/2021   Procedure: RIGHT BREAST LUMPECTOMY WITH RADIOACTIVE SEED AND SENTINEL LYMPH NODE BIOPSY;  Surgeon: Jovita Kussmaul, MD;  Location: [facility];  Service: General;  Laterality: Right;   HAND SURGERY Right 1997   Hand Fracture (right thumb & hand)   LAPAROTOMY  1974   RIGHT/LEFT HEART CATH AND CORONARY ANGIOGRAPHY N/A 10/02/2019    Procedure: RIGHT/LEFT HEART CATH AND CORONARY ANGIOGRAPHY;  Surgeon: Sherren Mocha, MD;  Location: [facility];  Service: Cardiovascular;  Laterality: N/A;   THORACENTESIS N/A 01/12/2022   Procedure: THORACENTESIS;  Surgeon: Juanito Doom, MD;  Location: [facility];  Service: Cardiopulmonary;  Laterality: N/A;     Home Medications:  Prior to Admission medications   Medication Sig Start Date End Date Taking? Authorizing Provider  acetaminophen (TYLENOL) 500 MG tablet Take 1,000 mg by mouth daily as needed (pain).   Yes [provider]  ALPRAZolam Duanne Moron) 0.5 MG tablet Take 0.5 mg by mouth at bedtime as needed for sleep. 10/31/18  Yes [provider]  amLODipine (NORVASC) 10 MG tablet Take 10 mg by mouth daily. 11/09/21  Yes [provider]  aspirin EC 81 MG tablet Take 81 mg by mouth daily.   Yes [provider]  Budeson-Glycopyrrol-Formoterol (BREZTRI AEROSPHERE) 160-9-4.8 MCG/ACT AERO Inhale 2 puffs into the lungs in the morning and at bedtime. 01/11/22  Yes Icard, Octavio Graves, DO  Cholecalciferol (VITAMIN D3) 25 MCG (1000 UT) CAPS Take 1,000 Units by mouth daily.   Yes [provider]  exemestane (AROMASIN) 25 MG tablet Take 0.5 tablets (12.5 mg total) by mouth daily after breakfast. 12/21/21  Yes Nicholas Lose, MD  fluticasone (FLONASE) 50 MCG/ACT nasal spray Place 2 sprays into both nostrils 2 (two) times daily.   Yes [provider]  guaiFENesin (MUCINEX) 600 MG 12 hr tablet Take 600 mg by mouth 2 (two) times daily as needed for to loosen phlegm.   Yes [provider]  metoprolol tartrate (LOPRESSOR) 25 MG tablet Take 1 tablet (25 mg total) by mouth 2 (two) times daily. 06/12/21  Yes Loel Dubonnet, NP  pantoprazole (PROTONIX) 40 MG tablet Take 1 tablet (40 mg total) by mouth daily at 6 (six) AM. 12/16/21  Yes Eugenie Filler, MD  PROAIR HFA 108 [phone] Base) MCG/ACT inhaler Inhale 2 puffs into the lungs every 6 (six)  hours as needed for wheezing or shortness of breath. 90 day supply 01/11/22  Yes Icard, Bradley L, DO  vitamin B-12 (CYANOCOBALAMIN) 500 MCG tablet Take 500 mcg by mouth daily.   Yes [provider]  Vitamin E Skin OIL Apply 1 Application topically daily.   Yes [provider]  atorvastatin (LIPITOR) 10 MG tablet Take 1 tablet (10 mg total) by mouth daily. Patient not taking: Reported on 01/19/2022 06/26/21   Sueanne Margarita, MD  furosemide (LASIX) 40 MG tablet Take 1 tablet (40 mg total) by mouth daily as needed. Take 1 tablet daily x 2 days, then as needed. Patient not taking: Reported on 01/19/2022 12/15/21   Eugenie Filler, MD  ipratropium-albuterol (DUONEB) 0.5-2.5 (3) MG/3ML SOLN Use 1 vial via nebulizer 2 times daily for 3 days then 2 times daily as need.(shortness of breath) Patient not taking: Reported on 01/19/2022 12/15/21   Eugenie Filler, MD  loratadine (CLARITIN) 10 MG tablet Take 1 tablet (10 mg total) by mouth daily. Patient not taking: Reported on 01/19/2022 12/16/21   Eugenie Filler, MD  magnesium hydroxide (MILK OF MAGNESIA) 400 MG/5ML suspension Take 15 mLs by mouth daily as needed for mild constipation. Patient not taking: Reported on 01/19/2022    [provider]  nystatin (  MYCOSTATIN) 100000 UNIT/ML suspension Use as directed 4 mLs in the mouth or throat 4 (four) times daily as needed. thrush Patient not taking: Reported on 01/19/2022 12/21/21   [provider]  OXYGEN Inhale 3 L into the lungs continuous.    [provider]  polyethylene glycol (MIRALAX / GLYCOLAX) 17 g packet Take 17 g by mouth daily. Patient not taking: Reported on 01/19/2022 01/07/19   Arrien, Jimmy Picket, MD    Inpatient Medications: Scheduled Meds:  arformoterol  15 mcg Nebulization BID   aspirin EC  81 mg Oral Daily   budesonide (PULMICORT) nebulizer solution  0.25 mg Nebulization BID   Chlorhexidine Gluconate Cloth  6 each Topical Daily    enoxaparin (LOVENOX) injection  40 mg Subcutaneous Q24H   exemestane  12.5 mg Oral QPC breakfast   levalbuterol  1.25 mg Nebulization Q6H   metoprolol tartrate  25 mg Oral BID   mouth rinse  15 mL Mouth Rinse 4 times per day   pantoprazole  40 mg Oral Q0600   potassium chloride  40 mEq Oral Once   [START ON 02/03/2022] predniSONE  40 mg Oral Q breakfast   revefenacin  175 mcg Nebulization Daily   Continuous Infusions:  azithromycin Stopped (02/02/22 0702)   [START ON 02/03/2022] azithromycin     [START ON 02/03/2022] cefTRIAXone (ROCEPHIN)  IV     diltiazem (CARDIZEM) infusion 5 mg/hr (02/02/22 0703)   magnesium sulfate bolus IVPB 2 g (02/02/22 1038)   PRN Meds: acetaminophen **OR** acetaminophen, levalbuterol, morphine injection, ondansetron **OR** ondansetron (ZOFRAN) IV, mouth rinse  Allergies:    Allergies  Allergen Reactions   Crab [Shellfish Allergy] Anaphylaxis and Swelling    Dungeness crab   Erythromycin Other (See Comments)    convulsion   Hydrocodone Nausea And Vomiting    Severe vomiting     Social History:   Social History   Socioeconomic History   Marital status: Single    Spouse name: Not on file   Number of children: Not on file   Years of education: Not on file   Highest education level: Not on file  Occupational History   Not on file  Tobacco Use   Smoking status: Former    Packs/day: 0.80    Years: 43.00    Total pack years: 34.40    Types: Cigarettes   Smokeless tobacco: Never   Tobacco comments:    Quit smoking 2 - 3 months a go 11/30/21  Vaping Use   Vaping Use: Never used  Substance and Sexual Activity   Alcohol use: Yes    Comment: gets 6 beers on a weekend and drinks them   Drug use: Not Currently    Types: Marijuana    Comment: tried to smoke some about a month ago   Sexual activity: Not on file  Other Topics Concern   Not on file  Social History Narrative   Not on file   Social Determinants of Health   Financial Resource  Strain: Not on file  Food Insecurity: Not on file  Transportation Needs: Not on file  Physical Activity: Not on file  Stress: Not on file  Social Connections: Not on file  Intimate Partner Violence: Not on file    Family History:    Family History  Problem Relation Age of Onset   COPD Mother    Esophageal cancer Maternal Aunt    Esophageal cancer Maternal Grandmother    Prostate cancer Other    Prostate cancer  Other    Parkinson's disease Other      ROS:  Please see the history of present illness.   All other ROS reviewed and negative.     Physical Exam/Data:   Vitals:   02/02/22 0829 02/02/22 0836 02/02/22 0841 02/02/22 0900  BP:    94/70  Pulse: (!) 121  (!) 116 (!) 121  Resp: (!) 22  16 (!) 23  Temp:      TempSrc:      SpO2: 90% 97% 100% 96%   No intake or output data in the 24 hours ending 02/02/22 1109    01/19/2022    2:05 PM 01/10/2022    5:05 PM 01/07/2022    1:46 PM  Last 3 Weights  Weight (lbs) 140 lb 4.8 oz 140 lb 143 lb  Weight (kg) 63.64 kg 63.504 kg 64.864 kg     There is no height or weight on file to calculate BMI.  General:  Well nourished, well developed, in no acute distress. Appears uncomfortable in the bed. Wearing BI-pap  HEENT: normal Neck: no JVD Vascular: Radial pulses 2+ bilaterally Cardiac:  normal S1, S2; irregular rate and rhythm, tachycardic  Lungs:  Scattered basilar rhonchi bilaterally, no crackles. Wearing Bi-pap  Abd: soft, nontender, no hepatomegaly  Ext: no edema Musculoskeletal:  No deformities, BUE and BLE strength normal and equal Skin: warm and dry  Neuro:  CNs 2-12 intact, no focal abnormalities noted Psych:  Normal affect   EKG:  The EKG was personally reviewed and demonstrates:  Atrial fibrillation, hr 124 BPM Telemetry:  Telemetry was personally reviewed and demonstrates:  Atrial fibrillation, HR in the 110s-120s   Relevant CV Studies:  Echocardiogram 12/15/2021 1. Left ventricular ejection fraction, by  estimation, is 60 to 65%. The  left ventricle has normal function. The left ventricle has no regional  wall motion abnormalities. There is mild left ventricular hypertrophy.  Left ventricular diastolic parameters  are consistent with Grade I diastolic dysfunction (impaired relaxation).   2. Right ventricular systolic function is normal. The right ventricular  size is mildly enlarged.   3. No evidence of mitral valve regurgitation.   4. Aortic valve regurgitation is not visualized. Aortic valve  sclerosis/calcification is present, without any evidence of aortic  stenosis.   Comparison(s): No significant change from prior study.   Laboratory Data:  High Sensitivity Troponin:   Recent Labs  Lab 01/10/22 1615 01/11/22 0320 02/02/22 0131 02/02/22 0332  TROPONINIHS '5 6 5 5     '$ Chemistry Recent Labs  Lab 02/02/22 0131 02/02/22 0332  NA 140  --   K 3.7  --   CL 96*  --   CO2 38*  --   GLUCOSE 118*  --   BUN 12  --   CREATININE 0.57  --   CALCIUM 9.0  --   MG  --  1.9  GFRNONAA >60  --   ANIONGAP 6  --     Recent Labs  Lab 02/02/22 0131  PROT 7.4  ALBUMIN 3.6  AST 14*  ALT 13  ALKPHOS 52  BILITOT 0.5   Lipids No results for input(s): "CHOL", "TRIG", "HDL", "LABVLDL", "LDLCALC", "CHOLHDL" in the last 168 hours.  Hematology Recent Labs  Lab 02/02/22 0131  WBC 11.6*  RBC 4.43  HGB 13.2  HCT 42.6  MCV 96.2  MCH 29.8  MCHC 31.0  RDW 15.8*  PLT 213   Thyroid No results for input(s): "TSH", "FREET4" in the last 168 hours.  BNP Recent Labs  Lab 02/02/22 0150  BNP 26.7    DDimer  Recent Labs  Lab 02/02/22 0131  DDIMER 0.58*     Radiology/Studies:  CT Angio Chest Pulmonary Embolism (PE) W or WO Contrast  Result Date: 02/02/2022 CLINICAL DATA:  66 year old female with right side chest pain radiating to the back, shortness of breath. Hypoxic at presentation. EXAM: CT ANGIOGRAPHY CHEST WITH CONTRAST TECHNIQUE: Multidetector CT imaging of the chest was  performed using the standard protocol during bolus administration of intravenous contrast. Multiplanar CT image reconstructions and MIPs were obtained to evaluate the vascular anatomy. RADIATION DOSE REDUCTION: This exam was performed according to the departmental dose-optimization program which includes automated exposure control, adjustment of the mA and/or kV according to patient size and/or use of iterative reconstruction technique. CONTRAST:  53m OMNIPAQUE IOHEXOL 350 MG/ML SOLN COMPARISON:  Portable chest 0139 hours today.  CTA chest 01/10/2022. FINDINGS: Cardiovascular: Good contrast bolus timing in the pulmonary arterial tree. Mild respiratory motion. No pulmonary artery filling defect identified. Some central pulmonary artery enlargement (series 5, image 117). Calcified coronary artery atherosclerosis and/or stents. Calcified aortic atherosclerosis. No cardiomegaly or pericardial effusion. Mediastinum/Nodes: Reactive appearing mediastinal and hilar lymph nodes appear stable from last month. No mediastinal mass. Right axillary and superior breast surgical clips. Lungs/Pleura: Regressed but not completely resolved bilateral pleural effusions since last month. Enhancing lung base atelectasis greater on the left. Similar lung volumes with increased diffuse pulmonary septal thickening since last month (series 6, image 36). Major airways remain patent. No consolidation. Regressed lateral segment right middle lobe atelectasis, but increased lingula atelectasis since last month. Upper Abdomen: Negative visible liver, spleen, adrenal glands, kidneys and stomach in the upper abdomen. No upper abdominal free air or free fluid. Musculoskeletal: Stable.  No acute osseous abnormality identified. Review of the MIP images confirms the above findings. IMPRESSION: 1. Negative for acute pulmonary embolus. 2. Increased diffuse pulmonary septal thickening since last month. Regressed but not completely resolved bilateral  pleural effusions. Stable reactive appearing mediastinal lymph nodes. Consider acute pulmonary edema versus viral/atypical respiratory infection. Chronic interstitial lung disease felt less likely. 3. Calcified coronary artery and Aortic Atherosclerosis (ICD10-I70.0). Electronically Signed   By: HGenevie AnnM.D.   On: 02/02/2022 06:05   DG Chest Port 1 View  Result Date: 02/02/2022 CLINICAL DATA:  Sepsis EXAM: PORTABLE CHEST 1 VIEW COMPARISON:  01/13/2022 FINDINGS: The heart size and mediastinal contours are within normal limits. Both lungs are clear. The visualized skeletal structures are unremarkable. Lungs are hyperinflated. IMPRESSION: No active disease. Electronically Signed   By: KUlyses JarredM.D.   On: 02/02/2022 01:49     Assessment and Plan:   New onset atrial fibrillation with RVR  - Patient presented in NSR, however converted to afib with RVR while in the ED  - Started on IV diltiazem, HR continues to be elevated to the 120s - Most recent echocardiogram from 12/15/2021 showed EF 60-65%, mild LVH with grade I diastolic dysfunction, aortic valve sclerosis/calcification without any evidence of aortic stenosis  - Ordered limited echocardiogram  - TSH pending - Suspect atrial fibrillation occurred in the setting of acute on chronic respiratory failure  - CHADS-VASc 4 (patient also has history of cancer and ongoing tobacco use). [facility] eliquis 5 mg BID  - Starting metoprolol tartrate 25 mg BID this AM. Follow HR and BP  - Would prefer to avoid amiodarone as patient has significant COPD. Renal function normal, could consider  adding digoxin to help control HR/rhythm if needed  - Maintain K>4, Mag >2. Supplementation ordered per primary   Tobacco Use  - Encouraged tobacco Cessation   Otherwise per primary  - Acute on chronic hypoxic respiratory failure, likely secondary to COPD exacerbation  - R breast pain, history of R breast cancer    Risk Assessment/Risk  Scores:    CHA2DS2-VASc Score = 4   This indicates a 4.8% annual risk of stroke. The patient's score is based upon: CHF History: 0 HTN History: 1 Diabetes History: 0 Stroke History: 0 Vascular Disease History: 1 Age Score: 1 Gender Score: 1       For questions or updates, please contact [facility] Please consult [email or web address] for contact info under    Signed, Margie Billet, PA-C  02/02/2022 11:09 AM

## 2022-02-02 NOTE — H&P (Signed)
History and Physical    Patient: Caroline Greer LKG:[id number] DOB: [birth year] DOA: 02/02/2022 DOS: the patient was seen and examined on 02/02/2022 PCP: [name], MD  Patient coming from: Home  Chief Complaint:  Chief Complaint  Patient presents with   Chest Pain   HPI: Caroline Greer is a 66 y.o. female with medical history significant of COPD on home oxygen, history of acute respiratory failure with hypoxemia, breast cancer, history of radiation therapy, constipation, depression with anxiety, essential hypertension, hyperlipidemia, tobacco use who was admitted last month due to right breast cellulitis treated with cephalexin, COPD exacerbation bilateral pleural effusion requiring thoracentesis brought to the emergency department due to right-sided chest pain radiated to her back associated with dyspnea.  When EMS arrived to scene the patient's O2 sats was in the 40s.  She has had occasional dry cough, but no sputum production, fever, chills, rhinorrhea or sore throat.  No sick contacts or travel history.  The history is somewhat limited by the patient use of the BiPAP mask.  Her main complaint at the moment is pleuritic chest pain of the right lower chest wall.  Otherwise, no headache, precordial chest, back or abdominal pain at this moment.  ED course: Initial vital signs were temperature 98.2 F, pulse 98, respirations 24, BP 131/75 mmHg and O2 sat 100% on room air.  The patient received a DuoNeb by EMS.  She received methylprednisolone 125 mg IVPB, ceftriaxone 1 g IVPB, azithromycin 500 mg IVPB, morphine 4 mg IVP x2, ondansetron 4 mg x 2 and a 500 mL normal saline bolus.  Subsequently, the patient began having atrial fibrillation with RVR and was started on a continuous Cardizem infusion.  She was also assisted with BiPAP ventilation mode.  Lab work: Coronavirus PCR was negative.  CBC showed white count 11.6, hemoglobin 13.2 g/dL platelets 213.  Unremarkable PT, INR and PTT.  Lactic acid was  normal twice.  Troponin normal twice.  CMP showed a chloride of 96 and CO2 38 mmol/L with a mildly elevated glucose at 118 mg/dL.  The rest of the CMP values were within expected range.  Venous blood gas showed a PCO2 of 89 mmHg, bicarbonate of 44.8 and acid-based SS 14.2 mmol/L.  Imaging: One-view portable chest radiograph with no active disease.  CTA chest negative for PE.  There was increase in diffuse pulmonary septal thickening since last month.  Regressed but not completely resolved bilateral pleural effusions.  Stable reactive-appearing mediastinal lymph nodes.  Consider acute pulmonary edema versus viral/atypical respiratory infection.  There is calcified coronary artery and aortic atherosclerosis.   Review of Systems: As mentioned in the history of present illness. All other systems reviewed and are negative. Past Medical History:  Diagnosis Date   Acute respiratory failure with hypoxia ([place]) 09/30/2018   Breast cancer ([place])    Constipation    COPD exacerbation ([place]) 01/05/2019   Depression with anxiety 09/30/2018   Dyspnea    Essential hypertension 09/30/2018   History of radiation therapy    Right breast- 09/01/21-10/16/21- Dr. Gery Pray   Hyperlipidemia 02/02/2022   Hypertension    Tobacco dependence due to cigarettes 09/30/2018   Past Surgical History:  Procedure Laterality Date   BREAST LUMPECTOMY WITH RADIOACTIVE SEED AND SENTINEL LYMPH NODE BIOPSY Right 07/23/2021   Procedure: RIGHT BREAST LUMPECTOMY WITH RADIOACTIVE SEED AND SENTINEL LYMPH NODE BIOPSY;  Surgeon: Jovita Kussmaul, MD;  Location: [facility];  Service: General;  Laterality: Right;   HAND SURGERY Right 1997   Hand  Fracture (right thumb & hand)   LAPAROTOMY  1974   RIGHT/LEFT HEART CATH AND CORONARY ANGIOGRAPHY N/A 10/02/2019   Procedure: RIGHT/LEFT HEART CATH AND CORONARY ANGIOGRAPHY;  Surgeon: Sherren Mocha, MD;  Location: [facility];  Service: Cardiovascular;  Laterality: N/A;   THORACENTESIS N/A  01/12/2022   Procedure: THORACENTESIS;  Surgeon: Juanito Doom, MD;  Location: [facility];  Service: Cardiopulmonary;  Laterality: N/A;   Social History:  reports that she has quit smoking. Her smoking use included cigarettes. She has a 34.40 pack-year smoking history. She has never used smokeless tobacco. She reports current alcohol use. She reports that she does not currently use drugs after having used the following drugs: Marijuana.  Allergies  Allergen Reactions   Crab [Shellfish Allergy] Anaphylaxis and Swelling    Dungeness crab   Erythromycin Other (See Comments)    convulsion   Hydrocodone Nausea And Vomiting    Severe vomiting     Family History  Problem Relation Age of Onset   COPD Mother    Esophageal cancer Maternal Aunt    Esophageal cancer Maternal Grandmother    Prostate cancer Other    Prostate cancer Other    Parkinson's disease Other     Prior to Admission medications   Medication Sig Start Date End Date Taking? Authorizing Provider  acetaminophen (TYLENOL) 500 MG tablet Take 1,000 mg by mouth daily as needed (pain).   Yes [provider]  ALPRAZolam Duanne Moron) 0.5 MG tablet Take 0.5 mg by mouth at bedtime as needed for sleep. 10/31/18  Yes [provider]  amLODipine (NORVASC) 10 MG tablet Take 10 mg by mouth daily. 11/09/21  Yes [provider]  aspirin EC 81 MG tablet Take 81 mg by mouth daily.   Yes [provider]  Budeson-Glycopyrrol-Formoterol (BREZTRI AEROSPHERE) 160-9-4.8 MCG/ACT AERO Inhale 2 puffs into the lungs in the morning and at bedtime. 01/11/22  Yes Icard, Octavio Graves, DO  Cholecalciferol (VITAMIN D3) 25 MCG (1000 UT) CAPS Take 1,000 Units by mouth daily.   Yes [provider]  exemestane (AROMASIN) 25 MG tablet Take 0.5 tablets (12.5 mg total) by mouth daily after breakfast. 12/21/21  Yes Nicholas Lose, MD  fluticasone (FLONASE) 50 MCG/ACT nasal spray Place 2 sprays into both nostrils 2 (two) times daily.    Yes [provider]  guaiFENesin (MUCINEX) 600 MG 12 hr tablet Take 600 mg by mouth 2 (two) times daily as needed for to loosen phlegm.   Yes [provider]  metoprolol tartrate (LOPRESSOR) 25 MG tablet Take 1 tablet (25 mg total) by mouth 2 (two) times daily. 06/12/21  Yes Loel Dubonnet, NP  pantoprazole (PROTONIX) 40 MG tablet Take 1 tablet (40 mg total) by mouth daily at 6 (six) AM. 12/16/21  Yes Eugenie Filler, MD  PROAIR HFA 108 [phone] Base) MCG/ACT inhaler Inhale 2 puffs into the lungs every 6 (six) hours as needed for wheezing or shortness of breath. 90 day supply 01/11/22  Yes Icard, Bradley L, DO  vitamin B-12 (CYANOCOBALAMIN) 500 MCG tablet Take 500 mcg by mouth daily.   Yes [provider]  Vitamin E Skin OIL Apply 1 Application topically daily.   Yes [provider]  atorvastatin (LIPITOR) 10 MG tablet Take 1 tablet (10 mg total) by mouth daily. Patient not taking: Reported on 01/19/2022 06/26/21   Sueanne Margarita, MD  furosemide (LASIX) 40 MG tablet Take 1 tablet (40 mg total) by mouth daily as needed. Take  1 tablet daily x 2 days, then as needed. Patient not taking: Reported on 01/19/2022 12/15/21   Eugenie Filler, MD  ipratropium-albuterol (DUONEB) 0.5-2.5 (3) MG/3ML SOLN Use 1 vial via nebulizer 2 times daily for 3 days then 2 times daily as need.(shortness of breath) Patient not taking: Reported on 01/19/2022 12/15/21   Eugenie Filler, MD  loratadine (CLARITIN) 10 MG tablet Take 1 tablet (10 mg total) by mouth daily. Patient not taking: Reported on 01/19/2022 12/16/21   Eugenie Filler, MD  magnesium hydroxide (MILK OF MAGNESIA) 400 MG/5ML suspension Take 15 mLs by mouth daily as needed for mild constipation. Patient not taking: Reported on 01/19/2022    [provider]  nystatin (MYCOSTATIN) 100000 UNIT/ML suspension Use as directed 4 mLs in the mouth or throat 4 (four) times daily as needed. thrush Patient not taking: Reported on  01/19/2022 12/21/21   [provider]  OXYGEN Inhale 3 L into the lungs continuous.    [provider]  polyethylene glycol (MIRALAX / GLYCOLAX) 17 g packet Take 17 g by mouth daily. Patient not taking: Reported on 01/19/2022 01/07/19   Arrien, Jimmy Picket, MD    Physical Exam: Vitals:   02/02/22 0415 02/02/22 0445 02/02/22 0500 02/02/22 0515  BP: 127/64 113/67 128/69 114/66  Pulse: 93   (!) 106  Resp: (!) 23 (!) '23 19 20  '$ Temp:    97.7 F (36.5 C)  TempSrc:    Oral  SpO2: 99% 93%  100%   Physical Exam Vitals and nursing note reviewed.  Constitutional:      Appearance: She is ill-appearing.     Interventions: Face mask in place.     Comments: BiPAP mask on.  HENT:     Head: Normocephalic.     Nose: No rhinorrhea.     Mouth/Throat:     Mouth: Mucous membranes are dry.  Eyes:     Pupils: Pupils are equal, round, and reactive to light.  Neck:     Vascular: No JVD.  Cardiovascular:     Rate and Rhythm: Tachycardia present. Rhythm irregularly irregular.     Heart sounds: S1 normal and S2 normal.  Pulmonary:     Breath sounds: Decreased breath sounds, wheezing and rhonchi present. No rales.  Abdominal:     General: Bowel sounds are normal. There is no distension.     Palpations: Abdomen is soft.     Tenderness: There is no abdominal tenderness.  Musculoskeletal:     Cervical back: Neck supple.     Right lower leg: No edema.     Left lower leg: No edema.  Skin:    General: Skin is warm and dry.  Neurological:     General: No focal deficit present.     Mental Status: She is alert and oriented to person, place, and time.  Psychiatric:        Mood and Affect: Mood is anxious.    Data Reviewed:  Results are pending, will review when available.  Echocardiogram 12/15/2021  IMPRESSIONS    1. Left ventricular ejection fraction, by estimation, is 60 to 65%. The  left ventricle has normal function. The left ventricle has no regional  wall motion  abnormalities. There is mild left ventricular hypertrophy.  Left ventricular diastolic parameters  are consistent with Grade I diastolic dysfunction (impaired relaxation).   2. Right ventricular systolic function is normal. The right ventricular  size is mildly enlarged.   3. No evidence of mitral valve  regurgitation.   4. Aortic valve regurgitation is not visualized. Aortic valve  sclerosis/calcification is present, without any evidence of aortic  stenosis.  Assessment and Plan: Principal Problem:   Acute on chronic respiratory failure with hypoxia and hypercapnia (HCC) In the setting of:   COPD exacerbation ([place]) Questionable pneumonia on imaging. Admit to PCU/inpatient. Continue supplemental oxygen. Continue BiPAP ventilation mode Scheduled and as needed Xopenex. Prednisone 40 mg p.o. daily starting tomorrow. Continue ceftriaxone 1 g IVPB daily. Continue azithromycin 500 mg IVPB daily. Check strep pneumoniae urinary antigen. Check sputum Gram stain, culture and sensitivity. Follow-up blood culture and sensitivity. Follow-up CBC and chemistry in the morning. Pulmonology consult appreciated.  Active Problems:   Atrial fibrillation with rapid ventricular response (HCC) CHA2DS2-VASc Score of at least 5. Observation/PCU. Cardiology started DOAC. Continue diltiazem infusion. Continue beta-blocker twice daily. Keep electrolytes optimized. Check echocardiogram. Cardiology consult appreciated.    Essential hypertension Amlodipine was held. Continue metoprolol 25 mg p.o. twice daily. Hold amlodipine to avoid reflex tachycardia. Monitor blood pressure and heart rate.    Breast pain Continue oxycodone as needed. Toradol 15 mg IVP x1 dose. No further NSAIDs as she just started apixaban.    Depression with anxiety Hold alprazolam due to CO2 retention.    GERD (gastroesophageal reflux disease) Continue pantoprazole 40 mg p.o. daily.    Hyperlipidemia Currently not taking  atorvastatin.    Advance Care Planning:   Code Status: Full Code   Consults: Cardiology and PCCM.  Family Communication:   Severity of Illness: The appropriate patient status for this patient is INPATIENT. Inpatient status is judged to be reasonable and necessary in order to provide the required intensity of service to ensure the patient's safety. The patient's presenting symptoms, physical exam findings, and initial radiographic and laboratory data in the context of their chronic comorbidities is felt to place them at high risk for further clinical deterioration. Furthermore, it is not anticipated that the patient will be medically stable for discharge from the hospital within 2 midnights of admission.   * I certify that at the point of admission it is my clinical judgment that the patient will require inpatient hospital care spanning beyond 2 midnights from the point of admission due to high intensity of service, high risk for further deterioration and high frequency of surveillance required.*  Author: Reubin Milan, MD 02/02/2022 8:07 AM  For on call review [email or web address].   This document was prepared using Dragon voice recognition software and may contain some unintended transcription errors.

## 2022-02-02 NOTE — Progress Notes (Signed)
Assisted with transporting PT to [facility] ICU / SDU while on BiPAP - uneventful.

## 2022-02-02 NOTE — Progress Notes (Signed)
Patient refuses ABG. SpO2 100% on baseline 4L O2. Patient denies [facility]. EDP made aware.

## 2022-02-02 NOTE — Progress Notes (Signed)
RN placed PT on BiPAP, PT tolerating well at this time.

## 2022-02-02 NOTE — Progress Notes (Signed)
PT placed on bipap, tolerating well at this time.

## 2022-02-02 NOTE — ED Triage Notes (Signed)
Pt. BIB [facility] c/o R. Sided chest pain that radiates to the back. Pain associated with SOB. Pt. Had O2 in 9s when EMS arrived.

## 2022-02-03 DIAGNOSIS — J9621 Acute and chronic respiratory failure with hypoxia: Secondary | ICD-10-CM | POA: Diagnosis not present

## 2022-02-03 DIAGNOSIS — N644 Mastodynia: Secondary | ICD-10-CM | POA: Diagnosis not present

## 2022-02-03 DIAGNOSIS — J9622 Acute and chronic respiratory failure with hypercapnia: Secondary | ICD-10-CM | POA: Diagnosis not present

## 2022-02-03 DIAGNOSIS — R0789 Other chest pain: Secondary | ICD-10-CM | POA: Diagnosis not present

## 2022-02-03 DIAGNOSIS — I4891 Unspecified atrial fibrillation: Secondary | ICD-10-CM | POA: Diagnosis not present

## 2022-02-03 DIAGNOSIS — J441 Chronic obstructive pulmonary disease with (acute) exacerbation: Secondary | ICD-10-CM | POA: Diagnosis not present

## 2022-02-03 LAB — BASIC METABOLIC PANEL
Anion gap: 6 (ref 5–15)
BUN: 17 mg/dL (ref 8–23)
CO2: 32 mmol/L (ref 22–32)
Calcium: 8.8 mg/dL — ABNORMAL LOW (ref 8.9–10.3)
Chloride: 99 mmol/L (ref 98–111)
Creatinine, Ser: 0.49 mg/dL (ref 0.44–1.00)
GFR, Estimated: >60 mL/min (ref >60)
Glucose, Bld: 151 mg/dL — ABNORMAL HIGH (ref 70–99)
Potassium: 5.4 mmol/L — ABNORMAL HIGH (ref 3.5–5.1)
Sodium: 137 mmol/L (ref 135–145)

## 2022-02-03 LAB — POTASSIUM: Potassium: 5.2 mmol/L — ABNORMAL HIGH (ref 3.5–5.1)

## 2022-02-03 LAB — URINE CULTURE

## 2022-02-03 LAB — T4, FREE: Free T4: 0.71 ng/dL (ref 0.61–1.12)

## 2022-02-03 MED ORDER — ALPRAZOLAM 0.5 MG PO TABS
0.5000 mg | ORAL_TABLET | Freq: Once | ORAL | Status: AC
  Administered 2022-02-03: 0.5 mg via ORAL
  Filled 2022-02-03: qty 1

## 2022-02-03 MED ORDER — SODIUM ZIRCONIUM CYCLOSILICATE 5 G PO PACK
5.0000 g | PACK | Freq: Once | ORAL | Status: AC
  Administered 2022-02-03: 5 g via ORAL
  Filled 2022-02-03: qty 1

## 2022-02-03 MED ORDER — DILTIAZEM HCL 30 MG PO TABS: 30.0000 mg | ORAL_TABLET | Freq: Every day | ORAL | Status: DC | PRN

## 2022-02-03 MED ORDER — DILTIAZEM HCL 60 MG PO TABS
60.0000 mg | ORAL_TABLET | Freq: Two times a day (BID) | ORAL | Status: DC
  Administered 2022-02-03: 60 mg via ORAL
  Filled 2022-02-03: qty 1

## 2022-02-03 MED ORDER — SODIUM ZIRCONIUM CYCLOSILICATE 5 G PO PACK: 5.0000 g | PACK | Freq: Once | ORAL | Status: DC

## 2022-02-03 NOTE — Progress Notes (Signed)
Patient stated she could not understand why she had to stay in the hospital and why she could not go home in the morning since all of her lab work came back as normal. This nurse educated the patient that her lab work has not been within normal ranges since admission and that she needed to continue to be hospitalized so we can monitor her infection as well as her respiratory advancement. The patient gave her verbal understanding of this.

## 2022-02-03 NOTE — Plan of Care (Signed)

## 2022-02-03 NOTE — Progress Notes (Signed)
[facility] Progress Note Patient Name: Caroline Greer DOB: October 26, 1955 MRN: [id number]   Date of Service  02/03/2022  HPI/Events of Note  K+ 5.4  eICU Interventions  Lokelma  5 gm po x 1        [name] U Sareen Randon 02/03/2022, 6:07 AM

## 2022-02-03 NOTE — Progress Notes (Signed)
NAME:  Caroline Greer, MRN:  [id number], DOB:  1955-08-06, LOS: 1 ADMISSION DATE:  02/02/2022, CONSULTATION DATE:  02/03/22 REFERRING MD:  Olevia Bowens, CHIEF COMPLAINT:  chest/breast pain   History of Present Illness:  Caroline Greer is a 66 y.o. 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  She wears 4L [place] O2 at baseline, denies any recent cough, increased sputum production, fever or other URI symptoms.  She states that she had the sudden onset of severe R breast pain that is radiating to her R chest wall yesterday.  The pain is so intense it caused shortness of breath.  She was treated with Keflex for cellulitis of the R breast during her hospitalization for COPD exacerbation and pleural effusion requiring thoracentesis last month and her symptoms improved.  The pain this time is much more severe.   ED course included CTA chest which was negative for PE, showed improved pleural effusions, diffuse pulmonary septal thickening.   Her HR converted to afib with RVR and she was started on Cardizem gtt and Bipap and given Ceftriaxone/azithromycin.  Labs showed Troponin of 5, BNP 26, procal <0.10, lactic acid 1.0, WBC 11.6.   PCCM consulted in this setting  Pertinent  Medical History  Acute respiratory failure with hypoxia Breast cancer Constipation COPD exacerbation Depression with anxiety Essential hypertension  History of radiation therapy Hyperlipidemia  Hypertension   Tobacco dependence due to cigarettes   Significant Hospital Events: Including procedures, antibiotic start and stop dates in addition to other pertinent events   9/12 admit to stepdown with hypoxic respiratory failure, PCCM consult 9/13 No acute issues overnight, on 4L [place] this am   Interim History / Subjective:  States she feels  better with improved dyspnea but not yet back to baseline  Report right breast pain is improved   Objective   Blood pressure (!) 107/54, pulse 66, temperature 98.2 F (36.8 C), temperature source Oral, resp. rate (!) 21, SpO2 94 %.    FiO2 (%):  [60 %-100 %] 60 %   Intake/Output Summary (Last 24 hours) at 02/03/2022 0710 Last data filed at 02/03/2022 0400 Gross per 24 hour  Intake 163.53 ml  Output 725 ml  Net -561.47 ml   There were no vitals filed for this visit.  Physical Exam  General: Acute on chronically ill appearing middle aged female lying in bed in NAD HEENT: [place]/AT, MM pink/moist, PERRL,  Neuro: Alert and oriented x3, non-focal,  CV: s1s2 regular rate and rhythm, no murmur, rubs, or gallops,  PULM:  Very faint expiratory wheeze, no increased work of breathing, no added breath sounds  GI: soft, bowel sounds active in all 4 quadrants, non-tender, non-distended, tolerating oral diet  Extremities: warm/dry, no edema  Skin: no rashes or lesions  Resolved Hospital Problem list     Assessment & Plan:   Acute on Chronic Hypoxic Respiratory Failure likely secondary to COPD exacerbation and difficult inspiration due to severe R breast and chest wall pain Baseline COPD Tobacco Use -Sees Dr. [name], PFT's pending, on Breztri, flonase, proair at home -RVP negative  P: Wean supplemental oxygen to maintain SpO2 of 88-92% Continue PRN BIPAP  Short acting bronchodilators Prednisone 55m PO daily x5 days Remains on  IV Azithromycin and Ceftriaxone  Follow cultures  CXR, ABG, CRP, Procal  Appropriate vaccinations prior to discharge   R breast pain History of R breast Ca -Completed radiation in May, currently on Exemestane. Breast is tender and erythematous -Right breast US negative for masses or evidence of abscess, follow up mammogram recommended  P: Continue antibiotics as above  Ensure adequate pain control   Atrial Fibrillation -New onset, no hypotension P: Primary  management per Cardiology/primary team  Eliquis and low dose beta blocker started 9/11 Continue Cardizem drip  Avoid Amiodarone as able  Continuous telemetry     No further Pulmonary needs identified. PCCM will sign off. Thank you for the opportunity to participate in this patient's care. Please contact if we can be of further assistance.   Best Practice (right click and "Reselect all SmartList Selections" daily)   Diet/type: NPO w/ oral meds DVT prophylaxis: LMWH GI prophylaxis: N/A Lines: N/A Foley:  N/A Code Status:  full code Last date of multidisciplinary goals of care discussion [pending]  Critical care time: N/A  Shogo Larkey D. Kenton Kingfisher, NP-C [facility] Personal contact information can be found on Amion  02/03/2022, 8:37 AM

## 2022-02-03 NOTE — Progress Notes (Signed)
Informed [name], NP of patient's elevated potassium level this AM (currently 5.4). See new order for [facility] to be given.

## 2022-02-03 NOTE — Hospital Course (Signed)
Caroline Greer is a 66 yo female with PMH COPD, chronic hypoxic respiratory failure, right breast cancer s/p lumpectomy and radiation, depression/anxiety, HTN, HLD, tobacco use, and nonobstructive CAD managed medically (last cath 09/2019) who presented with right-sided chest pain.  She was also short of breath.  She was found to be hypoxic with SPO2 in the 40s on EMS arrival initially.  She was reporting occasional dry cough but no sputum nor fevers, chills. She was placed on BiPAP and brought to the hospital for further evaluation. During work-up she was also noted to be in new onset A-fib with RVR. She underwent evaluations by pulmonology and cardiology as well.

## 2022-02-03 NOTE — Progress Notes (Signed)
Progress Note    Caroline Greer   YPP:[id number]  DOB: Jul 21, 1955  DOA: 02/02/2022     1 PCP: [name], MD  Initial CC: SOB, right chest pain  Hospital Course: Caroline Greer is a 66 yo female with PMH COPD, chronic hypoxic respiratory failure, right breast cancer s/p lumpectomy and radiation, depression/anxiety, HTN, HLD, tobacco use, and nonobstructive CAD managed medically (last cath 09/2019) who presented with right-sided chest pain.  She was also short of breath.  She was found to be hypoxic with SPO2 in the 40s on EMS arrival initially.  She was reporting occasional dry cough but no sputum nor fevers, chills. She was placed on BiPAP and brought to the hospital for further evaluation. During work-up she was also noted to be in new onset A-fib with RVR. She underwent evaluations by pulmonology and cardiology as well.  Interval History:  Right-sided chest pain and pleuritic pain has improved since admission.  She also feels more comfortable from a breathing standpoint however did have desaturation this morning.  Assessment and Plan:  Acute on chronic respiratory failure with hypoxia and hypercapnia (HCC) COPD exacerbation [facility]) -Pulmonology consulted on admission as well; patient follows with Dr. [name] - CTA chest negative for PE.  Did show increased diffuse pulmonary septal thickening and underlying edema versus atypical infection. - She was started on antibiotics and steroids on admission.  Of note, has been tolerating azithromycin in context of history of erythromycin allergy -RVP negative as well  Atrial fibrillation with rapid ventricular response (HCC) CHA2DS2-VASc Score of at least 5 - started on cardizem gtt on admission - Eliquis also initiated as well  - echo obtained on admission - cardiology following  - TSH is slightly suppressed; will order FT4 and TT3; patient denies any prior thyroid disease nor in the familiy  - cardizem drip being transitioned to Toprol per  cardiology  - amio to be avoided with underlying lung disease   Essential hypertension - continue toprol    Breast pain Continue oxycodone as needed.  Depression with anxiety -Database reviewed.  Sporadic fills of alprazolam 0.5 mg; last filled 01/08/2022 -Okay to continue holding for now unless develops significant symptoms   GERD (gastroesophageal reflux disease) Continue pantoprazole 40 mg p.o. daily.   Hyperlipidemia Currently not taking atorvastatin.  Reminded patient her CAD is being treated with medical management.  She was willing to consider restarting Lipitor   Old records reviewed in assessment of this patient  Antimicrobials: Azithro 9/12 >> current Rocephin 9/12 >> current   DVT prophylaxis:   apixaban (ELIQUIS) tablet 5 mg   Code Status:   Code Status: Full Code  Mobility Assessment (last 72 hours)     Mobility Assessment     Row Name 02/02/22 2000           Does patient have an order for bedrest or is patient medically unstable No - Continue assessment       What is the highest level of mobility based on the progressive mobility assessment? Level 3 (Stands with assist) - Balance while standing  and cannot march in place                Barriers to discharge: none Disposition Plan:  Home Status is: Inpt  Objective: Blood pressure (!) 108/55, pulse 80, temperature 99 F (37.2 C), temperature source Oral, resp. rate (!) 21, SpO2 99 %.  Examination:  Physical Exam Constitutional:      Appearance: She is well-developed.  HENT:  Head: Normocephalic and atraumatic.     Mouth/Throat:     Mouth: Mucous membranes are moist.  Eyes:     Extraocular Movements: Extraocular movements intact.  Cardiovascular:     Rate and Rhythm: Normal rate. Rhythm irregular.  Pulmonary:     Effort: Pulmonary effort is normal.  Abdominal:     General: Bowel sounds are normal. There is no distension.     Palpations: Abdomen is soft.     Tenderness: There is no  abdominal tenderness.  Musculoskeletal:        General: Normal range of motion.     Cervical back: Normal range of motion and neck supple.  Skin:    General: Skin is warm and dry.  Neurological:     General: No focal deficit present.     Mental Status: She is alert.  Psychiatric:        Mood and Affect: Mood normal.      Consultants:  Cardiology Pulmonology   Procedures:    Data Reviewed: Results for orders placed or performed during the hospital encounter of 02/02/22 (from the past 24 hour(s))  Urinalysis, Routine w reflex microscopic     Status: Abnormal   Collection Time: 02/02/22  2:30 PM  Result Value Ref Range   Color, Urine YELLOW YELLOW   APPearance HAZY (A) CLEAR   Specific Gravity, Urine <1.005 (L) 1.005 - 1.030   pH 5.0 5.0 - 8.0   Glucose, UA NEGATIVE NEGATIVE mg/dL   Hgb urine dipstick SMALL (A) NEGATIVE   Bilirubin Urine NEGATIVE NEGATIVE   Ketones, ur NEGATIVE NEGATIVE mg/dL   Protein, ur 30 (A) NEGATIVE mg/dL   Nitrite NEGATIVE NEGATIVE   Leukocytes,Ua LARGE (A) NEGATIVE   RBC / HPF 11-20 0 - 5 RBC/hpf   WBC, UA 11-20 0 - 5 WBC/hpf   Bacteria, UA RARE (A) NONE SEEN   Squamous Epithelial / LPF 21-50 0 - 5   Mucus PRESENT    Non Squamous Epithelial 0-5 (A) NONE SEEN  Urine Culture     Status: Abnormal   Collection Time: 02/02/22  2:30 PM   Specimen: In/Out Cath Urine  Result Value Ref Range   Specimen Description      IN/OUT CATH URINE Performed at [facility], [place] [address], [place], [place] [address]    Special Requests      NONE Performed at [facility], [place] [address], [place], [place] [address]    Culture MULTIPLE SPECIES PRESENT, SUGGEST RECOLLECTION (A)    Report Status 02/03/2022 FINAL   TSH     Status: Abnormal   Collection Time: 02/02/22  4:36 PM  Result Value Ref Range   TSH 0.243 (L) 0.350 - 4.500 uIU/mL  Basic metabolic panel     Status: Abnormal   Collection Time: 02/03/22  3:14 AM   Result Value Ref Range   Sodium 137 135 - 145 mmol/L   Potassium 5.4 (H) 3.5 - 5.1 mmol/L   Chloride 99 98 - 111 mmol/L   CO2 32 22 - 32 mmol/L   Glucose, Bld 151 (H) 70 - 99 mg/dL   BUN 17 8 - 23 mg/dL   Creatinine, Ser 0.49 0.44 - 1.00 mg/dL   Calcium 8.8 (L) 8.9 - 10.3 mg/dL   GFR, Estimated >60 >60 mL/min   Anion gap 6 5 - 15  Potassium     Status: Abnormal   Collection Time: 02/03/22  8:18 AM  Result Value Ref Range  Potassium 5.2 (H) 3.5 - 5.1 mmol/L    I have Reviewed nursing notes, Vitals, and Lab results since pt's last encounter. Pertinent lab results : see above I have ordered test including BMP, CBC, Mg I have reviewed the last note from staff over past 24 hours I have discussed pt's care plan and test results with nursing staff, case manager   LOS: 1 day   Dwyane Dee, MD [facility] 02/03/2022, 2:18 PM

## 2022-02-03 NOTE — Progress Notes (Signed)
Rounding Note    Patient Name: Caroline Greer Date of Encounter: 02/03/2022  [place] Cardiologist: [name], MD   Subjective   Patient reports that her right breast pain has improved.  Instead of sharp and stabbing, now feels like a "nagging" pain.  Breathing is improving as well.  Denies any palpitations, dizziness  Telemetry, patient converted to sinus rhythm with frequent PACs yesterday at 2000  Inpatient Medications    Scheduled Meds:  apixaban  5 mg Oral BID   arformoterol  15 mcg Nebulization BID   aspirin EC  81 mg Oral Daily   budesonide (PULMICORT) nebulizer solution  0.25 mg Nebulization BID   Chlorhexidine Gluconate Cloth  6 each Topical Daily   exemestane  12.5 mg Oral QPC breakfast   levalbuterol  1.25 mg Nebulization Q6H   metoprolol succinate  25 mg Oral Daily   mouth rinse  15 mL Mouth Rinse 4 times per day   pantoprazole  40 mg Oral Q0600   predniSONE  40 mg Oral Q breakfast   revefenacin  175 mcg Nebulization Daily   Continuous Infusions:  azithromycin Stopped (02/02/22 0702)   azithromycin     cefTRIAXone (ROCEPHIN)  IV Stopped (02/03/22 0606)   diltiazem (CARDIZEM) infusion 5 mg/hr (02/03/22 0028)   PRN Meds: acetaminophen **OR** acetaminophen, levalbuterol, morphine injection, ondansetron **OR** ondansetron (ZOFRAN) IV, mouth rinse   Vital Signs    Vitals:   02/03/22 0600 02/03/22 0700 02/03/22 0746 02/03/22 0929  BP: (!) 107/54   (!) 108/55  Pulse: 66   80  Resp: (!) 21     Temp:  98.2 F (36.8 C)    TempSrc:  Oral    SpO2: 94%  94%     Intake/Output Summary (Last 24 hours) at 02/03/2022 0959 Last data filed at 02/03/2022 0800 Gross per 24 hour  Intake 163.53 ml  Output 825 ml  Net -661.47 ml      01/19/2022    2:05 PM 01/10/2022    5:05 PM 01/07/2022    1:46 PM  Last 3 Weights  Weight (lbs) 140 lb 4.8 oz 140 lb 143 lb  Weight (kg) 63.64 kg 63.504 kg 64.864 kg      Telemetry    Patient converted to NSR with  frequent PACs, HR in the 80s - Personally Reviewed  ECG    No new tracings - Personally Reviewed  Physical Exam   GEN: No acute distress. Laying comfortably in the bed wearing [place].  Neck: No JVD Cardiac: regular rate and rhythm, no murmurs. Radial pulses 2+ bilaterally  Respiratory: Clear to auscultation bilaterally. Wearing [place] and receiving a breathing treatment at time of my evaluation  GI: Soft, nontender, non-distended  MS: Trace edema in left lower extremity, no edema in right lower extremity. Left lower extremity is nontender to palpation Neuro:  Nonfocal  Psych: Normal affect   Labs    High Sensitivity Troponin:   Recent Labs  Lab 01/10/22 1615 01/11/22 0320 02/02/22 0131 02/02/22 0332  TROPONINIHS '5 6 5 5     '$ Chemistry Recent Labs  Lab 02/02/22 0131 02/02/22 0332 02/03/22 0314 02/03/22 0818  NA 140  --  137  --   K 3.7  --  5.4* 5.2*  CL 96*  --  99  --   CO2 38*  --  32  --   GLUCOSE 118*  --  151*  --   BUN 12  --  17  --  CREATININE 0.57  --  0.49  --   CALCIUM 9.0  --  8.8*  --   MG  --  1.9  --   --   PROT 7.4  --   --   --   ALBUMIN 3.6  --   --   --   AST 14*  --   --   --   ALT 13  --   --   --   ALKPHOS 52  --   --   --   BILITOT 0.5  --   --   --   GFRNONAA >60  --  >60  --   ANIONGAP 6  --  6  --     Lipids No results for input(s): "CHOL", "TRIG", "HDL", "LABVLDL", "LDLCALC", "CHOLHDL" in the last 168 hours.  Hematology Recent Labs  Lab 02/02/22 0131  WBC 11.6*  RBC 4.43  HGB 13.2  HCT 42.6  MCV 96.2  MCH 29.8  MCHC 31.0  RDW 15.8*  PLT 213   Thyroid  Recent Labs  Lab 02/02/22 1636  TSH 0.243*    BNP Recent Labs  Lab 02/02/22 0150  BNP 26.7    DDimer  Recent Labs  Lab 02/02/22 0131  DDIMER 0.58*     Radiology    ECHOCARDIOGRAM LIMITED  Result Date: 02/02/2022    ECHOCARDIOGRAM LIMITED REPORT   Patient Name:   Caroline Greer Date of Exam: 02/02/2022 Medical Rec #:  [id number]     Height:       70.0 in Accession  #:    [id number]    Weight:       140.3 lb Date of Birth:  1955-10-24     BSA:          1.795 m Patient Age:    66 years      BP:           110/66 mmHg Patient Gender: F             HR:           101 bpm. Exam Location:  Inpatient Procedure: 2D Echo, Limited Echo and Limited Color Doppler Indications:     AFIB  History:         Patient has prior history of Echocardiogram examinations, most                  recent 12/15/2021. COPD, Signs/Symptoms:Dyspnea; Risk                  Factors:Hypertension and Dyslipidemia.  Sonographer:     Memory Argue Referring Phys:  [id number] Margie Billet Diagnosing Phys: Eleonore Chiquito MD IMPRESSIONS  1. Left ventricular ejection fraction, by estimation, is 60 to 65%. The left ventricle has normal function. The left ventricle has no regional wall motion abnormalities. Left ventricular diastolic function could not be evaluated.  2. Right ventricular systolic function is normal. The right ventricular size is mildly enlarged.  3. The mitral valve is grossly normal. Trivial mitral valve regurgitation. No evidence of mitral stenosis.  4. The aortic valve was not well visualized. Aortic valve regurgitation is not visualized. No aortic stenosis is present. Comparison(s): Changes from prior study are noted. Afib is now present. EF unchanged. FINDINGS  Left Ventricle: Left ventricular ejection fraction, by estimation, is 60 to 65%. The left ventricle has normal function. The left ventricle has no regional wall motion abnormalities. The left ventricular internal cavity size was normal in size. There  is  no left ventricular hypertrophy. Left ventricular diastolic function could not be evaluated. Left ventricular diastolic function could not be evaluated due to atrial fibrillation. Right Ventricle: The right ventricular size is mildly enlarged. No increase in right ventricular wall thickness. Right ventricular systolic function is normal. Mitral Valve: The mitral valve is grossly normal.  Trivial mitral valve regurgitation. No evidence of mitral valve stenosis. Tricuspid Valve: The tricuspid valve is grossly normal. Tricuspid valve regurgitation is trivial. No evidence of tricuspid stenosis. Aortic Valve: The aortic valve was not well visualized. Aortic valve regurgitation is not visualized. No aortic stenosis is present. Aortic valve mean gradient measures 4.0 mmHg. Aortic valve peak gradient measures 6.6 mmHg. LEFT VENTRICLE PLAX 2D LVIDd:         4.00 cm LVIDs:         2.60 cm LV PW:         1.00 cm LV IVS:        1.20 cm  RIGHT VENTRICLE TAPSE (M-mode): 2.2 cm LEFT ATRIUM         Index LA diam:    4.00 cm 2.23 cm/m  AORTIC VALVE AV Vmax:      128.00 cm/s AV Vmean:     86.900 cm/s AV VTI:       0.185 m AV Peak Grad: 6.6 mmHg AV Mean Grad: 4.0 mmHg Eleonore Chiquito MD Electronically signed by Eleonore Chiquito MD Signature Date/Time: 02/02/2022/4:20:06 PM    Final (Updated)    US BREAST LTD UNI RIGHT INC AXILLA  Result Date: 02/02/2022 CLINICAL DATA:  Pain and redness of the right breast upper outer quadrant, status post right lumpectomy in March 2023. EXAM: ULTRASOUND OF THE RIGHT BREAST COMPARISON:  Previous exam(s). FINDINGS: Targeted ultrasound is performed, showing no suspicious masses, shadowing lesions or fluid collections. IMPRESSION: No suspicious masses or evidence of abscess in the right breast 9-12 o'clock. RECOMMENDATION: Recommend further evaluation with diagnostic mammogram given the patient's presenting symptoms and history. I have discussed the findings and recommendations with the patient. If applicable, a reminder letter will be sent to the patient regarding the next appointment. BI-RADS CATEGORY  2: Benign. Electronically Signed   By: Fidela Salisbury M.D.   On: 02/02/2022 15:00   CT Angio Chest Pulmonary Embolism (PE) W or WO Contrast  Result Date: 02/02/2022 CLINICAL DATA:  66 year old female with right side chest pain radiating to the back, shortness of breath. Hypoxic at  presentation. EXAM: CT ANGIOGRAPHY CHEST WITH CONTRAST TECHNIQUE: Multidetector CT imaging of the chest was performed using the standard protocol during bolus administration of intravenous contrast. Multiplanar CT image reconstructions and MIPs were obtained to evaluate the vascular anatomy. RADIATION DOSE REDUCTION: This exam was performed according to the departmental dose-optimization program which includes automated exposure control, adjustment of the mA and/or kV according to patient size and/or use of iterative reconstruction technique. CONTRAST:  16m OMNIPAQUE IOHEXOL 350 MG/ML SOLN COMPARISON:  Portable chest 0139 hours today.  CTA chest 01/10/2022. FINDINGS: Cardiovascular: Good contrast bolus timing in the pulmonary arterial tree. Mild respiratory motion. No pulmonary artery filling defect identified. Some central pulmonary artery enlargement (series 5, image 117). Calcified coronary artery atherosclerosis and/or stents. Calcified aortic atherosclerosis. No cardiomegaly or pericardial effusion. Mediastinum/Nodes: Reactive appearing mediastinal and hilar lymph nodes appear stable from last month. No mediastinal mass. Right axillary and superior breast surgical clips. Lungs/Pleura: Regressed but not completely resolved bilateral pleural effusions since last month. Enhancing lung base atelectasis greater on the left. Similar lung volumes  with increased diffuse pulmonary septal thickening since last month (series 6, image 36). Major airways remain patent. No consolidation. Regressed lateral segment right middle lobe atelectasis, but increased lingula atelectasis since last month. Upper Abdomen: Negative visible liver, spleen, adrenal glands, kidneys and stomach in the upper abdomen. No upper abdominal free air or free fluid. Musculoskeletal: Stable.  No acute osseous abnormality identified. Review of the MIP images confirms the above findings. IMPRESSION: 1. Negative for acute pulmonary embolus. 2. Increased  diffuse pulmonary septal thickening since last month. Regressed but not completely resolved bilateral pleural effusions. Stable reactive appearing mediastinal lymph nodes. Consider acute pulmonary edema versus viral/atypical respiratory infection. Chronic interstitial lung disease felt less likely. 3. Calcified coronary artery and Aortic Atherosclerosis (ICD10-I70.0). Electronically Signed   By: Genevie Ann M.D.   On: 2022-02-09 06:05   DG Chest Port 1 View  Result Date: 02-09-22 CLINICAL DATA:  Sepsis EXAM: PORTABLE CHEST 1 VIEW COMPARISON:  01/13/2022 FINDINGS: The heart size and mediastinal contours are within normal limits. Both lungs are clear. The visualized skeletal structures are unremarkable. Lungs are hyperinflated. IMPRESSION: No active disease. Electronically Signed   By: Ulyses Jarred M.D.   On: 02/09/22 01:49    Cardiac Studies   Limited Echocardiogram 02-09-2022 1. Left ventricular ejection fraction, by estimation, is 60 to 65%. The  left ventricle has normal function. The left ventricle has no regional  wall motion abnormalities. Left ventricular diastolic function could not  be evaluated.   2. Right ventricular systolic function is normal. The right ventricular  size is mildly enlarged.   3. The mitral valve is grossly normal. Trivial mitral valve  regurgitation. No evidence of mitral stenosis.   4. The aortic valve was not well visualized. Aortic valve regurgitation  is not visualized. No aortic stenosis is present.   Comparison(s): Changes from prior study are noted. Afib is now present. EF  unchanged.   Patient Profile     66 y.o. female with a hx of COPD on home oxygen, history of acute respiratory failure with hypoxemia, breast cancer, history of radiation therapy, constipation, depression, anxiety, HTN, HLD, tobacco use who is being seen for the evaluation of new onset atrial fibrillation at the request of Dr. Olevia Bowens  Assessment & Plan    New onset atrial  fibrillation with RVR  - Patient presented in NSR, however converted to afib with RVR while in the ED  - Echo this admission showed EF 60-65%, no regional wall motion abnormalities, normal RV systolic function - TSH 3.662. Free T4 and T3 have been ordered by primary  - Appears that patient converted to NSR with frequent PACs yesterday at 2000. However, there is a very messy baseline and frequent artifact on telemetry. I have ordered an EKG to confirm.  - HR consistently in the 80s per telemetry  - Continue metoprolol succinate 25 mg daily  - Transition from IV diltiazem to PO 60 mg BID today  - Would prefer to avoid amiodarone as patient has significant COPD - Maintain K>4, Mag >2  - CHADS-VASc 4. She is agreeable to starting [facility]. Started eliquis 5 mg BID yesterday. Ordered CBC for tomorrow AM to ensure hemoglobin/hematocrit stable    Tobacco Use  - Encouraged tobacco Cessation    Otherwise per primary  - Acute on chronic hypoxic respiratory failure, likely secondary to COPD exacerbation  - R breast pain, history of R breast cancer       For questions or updates, please contact [facility] Please consult [email or web address] for contact info under        Signed, Margie Billet, PA-C  02/03/2022, 9:59 AM

## 2022-02-04 DIAGNOSIS — J9621 Acute and chronic respiratory failure with hypoxia: Secondary | ICD-10-CM | POA: Diagnosis not present

## 2022-02-04 DIAGNOSIS — J9622 Acute and chronic respiratory failure with hypercapnia: Secondary | ICD-10-CM | POA: Diagnosis not present

## 2022-02-04 DIAGNOSIS — E0590 Thyrotoxicosis, unspecified without thyrotoxic crisis or storm: Secondary | ICD-10-CM | POA: Diagnosis not present

## 2022-02-04 DIAGNOSIS — I4891 Unspecified atrial fibrillation: Secondary | ICD-10-CM | POA: Diagnosis not present

## 2022-02-04 LAB — CBC WITH DIFFERENTIAL/PLATELET
Abs Immature Granulocytes: 0.05 10*3/uL (ref 0.00–0.07)
Basophils Absolute: 0 10*3/uL (ref 0.0–0.1)
Basophils Relative: 0 %
Eosinophils Absolute: 0 10*3/uL (ref 0.0–0.5)
Eosinophils Relative: 0 %
HCT: 36.8 % (ref 36.0–46.0)
Hemoglobin: 11.5 g/dL — ABNORMAL LOW (ref 12.0–15.0)
Immature Granulocytes: 0 %
Lymphocytes Relative: 9 %
Lymphs Abs: 1.2 10*3/uL (ref 0.7–4.0)
MCH: 29.9 pg (ref 26.0–34.0)
MCHC: 31.3 g/dL (ref 30.0–36.0)
MCV: 95.8 fL (ref 80.0–100.0)
Monocytes Absolute: 0.8 10*3/uL (ref 0.1–1.0)
Monocytes Relative: 6 %
Neutro Abs: 11.3 10*3/uL — ABNORMAL HIGH (ref 1.7–7.7)
Neutrophils Relative %: 85 %
Platelets: 211 10*3/uL (ref 150–400)
RBC: 3.84 MIL/uL — ABNORMAL LOW (ref 3.87–5.11)
RDW: 16.1 % — ABNORMAL HIGH (ref 11.5–15.5)
WBC: 13.3 10*3/uL — ABNORMAL HIGH (ref 4.0–10.5)
nRBC: 0 % (ref 0.0–0.2)

## 2022-02-04 LAB — BASIC METABOLIC PANEL
Anion gap: 4 — ABNORMAL LOW (ref 5–15)
BUN: 17 mg/dL (ref 8–23)
CO2: 36 mmol/L — ABNORMAL HIGH (ref 22–32)
Calcium: 8.7 mg/dL — ABNORMAL LOW (ref 8.9–10.3)
Chloride: 100 mmol/L (ref 98–111)
Creatinine, Ser: 0.45 mg/dL (ref 0.44–1.00)
GFR, Estimated: >60 mL/min (ref >60)
Glucose, Bld: 113 mg/dL — ABNORMAL HIGH (ref 70–99)
Potassium: 4.3 mmol/L (ref 3.5–5.1)
Sodium: 140 mmol/L (ref 135–145)

## 2022-02-04 LAB — MAGNESIUM: Magnesium: 2.1 mg/dL (ref 1.7–2.4)

## 2022-02-04 MED ORDER — AZITHROMYCIN 250 MG PO TABS
500.0000 mg | ORAL_TABLET | Freq: Every day | ORAL | Status: AC
  Administered 2022-02-05 – 2022-02-06 (×2): 500 mg via ORAL
  Filled 2022-02-04 (×2): qty 2

## 2022-02-04 MED ORDER — ALPRAZOLAM 0.5 MG PO TABS
0.5000 mg | ORAL_TABLET | Freq: Every evening | ORAL | Status: DC | PRN
  Administered 2022-02-04 – 2022-02-09 (×5): 0.5 mg via ORAL
  Filled 2022-02-04 (×5): qty 1

## 2022-02-04 NOTE — Progress Notes (Signed)
Progress Note    Caroline Greer   [id number]:[id number]  DOB: 1955-10-08  DOA: 02/02/2022     2 PCP: Caroline Greer  Initial CC: SOB, right chest pain  Hospital Course: Ms. Caroline Greer is a 66 yo female with PMH COPD, chronic hypoxic respiratory failure, right breast cancer s/p lumpectomy and radiation, depression/anxiety, HTN, HLD, tobacco use, and nonobstructive CAD managed medically (last cath 09/2019) who presented with right-sided chest pain.  She was also short of breath.  She was found to be hypoxic with SPO2 in the 40s on EMS arrival initially.  She was reporting occasional dry cough but no sputum nor fevers, chills. She was placed on BiPAP and brought to the hospital for further evaluation. During work-up she was also noted to be in new onset A-fib with RVR. She underwent evaluations by pulmonology and cardiology as well.  Interval History:  No events overnight.  Breathing is comfortable but she was still wearing nonrebreather on top of salter high flow.  Without nonrebreather, she is maintaining adequate saturations on salter high flow alone.  Pleuritic CP improving still.   Assessment and Plan:  Acute on chronic respiratory failure with hypoxia and hypercapnia (HCC) COPD exacerbation [facility]) -Pulmonology consulted on admission as well; patient follows with Caroline Greer - CTA chest negative for PE.  Did show increased diffuse pulmonary septal thickening and underlying edema versus atypical infection. - She was started on antibiotics and steroids on admission.  Of note, has been tolerating azithromycin in context of history of erythromycin allergy -RVP negative as well  Atrial fibrillation with rapid ventricular response (HCC) CHA2DS2-VASc Score of at least 5 - started on cardizem gtt on admission - Eliquis also initiated as well  - echo obtained on admission - cardiology following  - TSH is slightly suppressed; will order FT4 and TT3; patient denies any prior thyroid disease nor in the  familiy  - cardizem drip being transitioned to Toprol per cardiology  - amio to be avoided with underlying lung disease  Subclinical hyperthyroidism -TSH mildly suppressed, 0.243. -Free T4 normal range, 0.71 -Total T3 pending -No further work-up at this time.  Repeat thyroid studies in 2 to 3 months   Essential hypertension - continue toprol    Breast pain Continue oxycodone as needed.  Depression with anxiety -Database reviewed.  Sporadic fills of alprazolam 0.5 mg; last filled 01/08/2022 -resuming nightly alprazolam as per home use    GERD (gastroesophageal reflux disease) Continue pantoprazole 40 mg p.o. daily.   Hyperlipidemia Currently not taking atorvastatin.  Reminded patient her CAD is being treated with medical management.  She was willing to consider restarting Lipitor   Old records reviewed in assessment of this patient  Antimicrobials: Azithro 9/12 >> current Rocephin 9/12 >> current   DVT prophylaxis:   apixaban (ELIQUIS) tablet 5 mg   Code Status:   Code Status: Full Code  Mobility Assessment (last 72 hours)     Mobility Assessment     Row Name 02/03/22 2000 02/02/22 2000         Does patient have an order for bedrest or is patient medically unstable No - Continue assessment No - Continue assessment      What is the highest level of mobility based on the progressive mobility assessment? Level 3 (Stands with assist) - Balance while standing  and cannot march in place Level 3 (Stands with assist) - Balance while standing  and cannot march in place  Barriers to discharge: none Disposition Plan:  Home Status is: Inpt  Objective: Blood pressure 128/67, pulse (!) 105, temperature 98.6 F (37 C), temperature source Oral, resp. rate 13, SpO2 (!) 81 %.  Examination:  Physical Exam Constitutional:      Appearance: She is well-developed.  HENT:     Head: Normocephalic and atraumatic.     Mouth/Throat:     Mouth: Mucous membranes are  moist.  Eyes:     Extraocular Movements: Extraocular movements intact.  Cardiovascular:     Rate and Rhythm: Normal rate. Rhythm irregular.  Pulmonary:     Effort: Pulmonary effort is normal.  Abdominal:     General: Bowel sounds are normal. There is no distension.     Palpations: Abdomen is soft.     Tenderness: There is no abdominal tenderness.  Musculoskeletal:        General: Normal range of motion.     Cervical back: Normal range of motion and neck supple.  Skin:    General: Skin is warm and dry.  Neurological:     General: No focal deficit present.     Mental Status: She is alert.  Psychiatric:        Mood and Affect: Mood normal.      Consultants:  Cardiology Pulmonology   Procedures:    Data Reviewed: Results for orders placed or performed during the hospital encounter of 02/02/22 (from the past 24 hour(s))  Basic metabolic panel     Status: Abnormal   Collection Time: 02/04/22  3:29 AM  Result Value Ref Range   Sodium 140 135 - 145 mmol/L   Potassium 4.3 3.5 - 5.1 mmol/L   Chloride 100 98 - 111 mmol/L   CO2 36 (H) 22 - 32 mmol/L   Glucose, Bld 113 (H) 70 - 99 mg/dL   BUN 17 8 - 23 mg/dL   Creatinine, Ser 0.45 0.44 - 1.00 mg/dL   Calcium 8.7 (L) 8.9 - 10.3 mg/dL   GFR, Estimated >60 >60 mL/min   Anion gap 4 (L) 5 - 15  CBC with Differential/Platelet     Status: Abnormal   Collection Time: 02/04/22  3:29 AM  Result Value Ref Range   WBC 13.3 (H) 4.0 - 10.5 K/uL   RBC 3.84 (L) 3.87 - 5.11 MIL/uL   Hemoglobin 11.5 (L) 12.0 - 15.0 g/dL   HCT 36.8 36.0 - 46.0 %   MCV 95.8 80.0 - 100.0 fL   MCH 29.9 26.0 - 34.0 pg   MCHC 31.3 30.0 - 36.0 g/dL   RDW 16.1 (H) 11.5 - 15.5 %   Platelets 211 150 - 400 K/uL   nRBC 0.0 0.0 - 0.2 %   Neutrophils Relative % 85 %   Neutro Abs 11.3 (H) 1.7 - 7.7 K/uL   Lymphocytes Relative 9 %   Lymphs Abs 1.2 0.7 - 4.0 K/uL   Monocytes Relative 6 %   Monocytes Absolute 0.8 0.1 - 1.0 K/uL   Eosinophils Relative 0 %    Eosinophils Absolute 0.0 0.0 - 0.5 K/uL   Basophils Relative 0 %   Basophils Absolute 0.0 0.0 - 0.1 K/uL   Immature Granulocytes 0 %   Abs Immature Granulocytes 0.05 0.00 - 0.07 K/uL  Magnesium     Status: None   Collection Time: 02/04/22  3:29 AM  Result Value Ref Range   Magnesium 2.1 1.7 - 2.4 mg/dL    I have Reviewed nursing notes, Vitals, and Lab results since pt's  last encounter. Pertinent lab results : see above I have ordered test including BMP, CBC, Mg I have reviewed the last note from staff over past 24 hours I have discussed pt's care plan and test results with nursing staff, case manager   LOS: 2 days   Dwyane Dee, Greer [facility] 02/04/2022, 1:32 PM

## 2022-02-04 NOTE — Progress Notes (Signed)
Pt had RN remove BiPAP Approx 01:30

## 2022-02-05 DIAGNOSIS — I4891 Unspecified atrial fibrillation: Secondary | ICD-10-CM | POA: Diagnosis not present

## 2022-02-05 DIAGNOSIS — J9621 Acute and chronic respiratory failure with hypoxia: Secondary | ICD-10-CM | POA: Diagnosis not present

## 2022-02-05 DIAGNOSIS — J9622 Acute and chronic respiratory failure with hypercapnia: Secondary | ICD-10-CM | POA: Diagnosis not present

## 2022-02-05 LAB — BASIC METABOLIC PANEL
Anion gap: 5 (ref 5–15)
BUN: 16 mg/dL (ref 8–23)
CO2: 37 mmol/L — ABNORMAL HIGH (ref 22–32)
Calcium: 8.8 mg/dL — ABNORMAL LOW (ref 8.9–10.3)
Chloride: 98 mmol/L (ref 98–111)
Creatinine, Ser: 0.45 mg/dL (ref 0.44–1.00)
GFR, Estimated: >60 mL/min (ref >60)
Glucose, Bld: 89 mg/dL (ref 70–99)
Potassium: 4.3 mmol/L (ref 3.5–5.1)
Sodium: 140 mmol/L (ref 135–145)

## 2022-02-05 LAB — CBC WITH DIFFERENTIAL/PLATELET
Abs Immature Granulocytes: 0.05 10*3/uL (ref 0.00–0.07)
Basophils Absolute: 0 10*3/uL (ref 0.0–0.1)
Basophils Relative: 0 %
Eosinophils Absolute: 0.1 10*3/uL (ref 0.0–0.5)
Eosinophils Relative: 1 %
HCT: 42.6 % (ref 36.0–46.0)
Hemoglobin: 13.2 g/dL (ref 12.0–15.0)
Immature Granulocytes: 0 %
Lymphocytes Relative: 17 %
Lymphs Abs: 2.1 10*3/uL (ref 0.7–4.0)
MCH: 30.1 pg (ref 26.0–34.0)
MCHC: 31 g/dL (ref 30.0–36.0)
MCV: 97 fL (ref 80.0–100.0)
Monocytes Absolute: 0.9 10*3/uL (ref 0.1–1.0)
Monocytes Relative: 8 %
Neutro Abs: 9.1 10*3/uL — ABNORMAL HIGH (ref 1.7–7.7)
Neutrophils Relative %: 74 %
Platelets: 230 10*3/uL (ref 150–400)
RBC: 4.39 MIL/uL (ref 3.87–5.11)
RDW: 16.1 % — ABNORMAL HIGH (ref 11.5–15.5)
WBC: 12.2 10*3/uL — ABNORMAL HIGH (ref 4.0–10.5)
nRBC: 0 % (ref 0.0–0.2)

## 2022-02-05 LAB — MAGNESIUM: Magnesium: 2 mg/dL (ref 1.7–2.4)

## 2022-02-05 LAB — TROPONIN I (HIGH SENSITIVITY)
Troponin I (High Sensitivity): 6 ng/L (ref <18)
Troponin I (High Sensitivity): 6 ng/L (ref <18)

## 2022-02-05 LAB — T3: T3, Total: 93 ng/dL (ref 71–180)

## 2022-02-05 MED ORDER — ORAL CARE MOUTH RINSE: 15.0000 mL | OROMUCOSAL | Status: DC | PRN

## 2022-02-05 NOTE — Discharge Instructions (Signed)

## 2022-02-05 NOTE — TOC Progression Note (Signed)
Transition of Care [facility]) - Progression Note    Patient Details  Name: Neve Branscomb MRN: [id number] Date of Birth: Mar 11, 1956  Transition of Care [facility]) CM/SW Contact  Servando Snare, [place] Phone Number: 02/05/2022, 1:11 PM  Clinical Narrative:     TOC following for dc needs. TOC consult for at home o2 and HH. Patient will need desaturation screening for home o2. TOC will watch for [facility] recs and orders.        Expected Discharge Plan and Services                                                 Social Determinants of Health (SDOH) Interventions    Readmission Risk Interventions    01/13/2022    1:08 PM  Readmission Risk Prevention Plan  Transportation Screening Complete  PCP or Specialist Appt within 5-7 Days Complete  Home Care Screening Complete  Medication Review (RN CM) Complete

## 2022-02-05 NOTE — Progress Notes (Signed)
Progress Note    Caroline Greer   [id number]  DOB: Jan 31, 1956  DOA: 02/02/2022     3 PCP: [name], MD  Initial CC: SOB, right chest pain  Hospital Course: Caroline Greer is a 66 yo female with PMH COPD, chronic hypoxic respiratory failure, right breast cancer s/p lumpectomy and radiation, depression/anxiety, HTN, HLD, tobacco use, and nonobstructive CAD managed medically (last cath 09/2019) who presented with right-sided chest pain.  She was also short of breath.  She was found to be hypoxic with SPO2 in the 40s on EMS arrival initially.  She was reporting occasional dry cough but no sputum nor fevers, chills. She was placed on BiPAP and brought to the hospital for further evaluation. During work-up she was also noted to be in new onset A-fib with RVR. She underwent evaluations by pulmonology and cardiology as well.  Interval History:  No events overnight.  Breathing is comfortable but still on higher O2 than at home. She also desats easily with exertion.  We will continue trying to wean further as able.   Assessment and Plan:  Acute on chronic respiratory failure with hypoxia and hypercapnia (HCC) COPD exacerbation [facility]) -Pulmonology consulted on admission as well; patient follows with Dr. [name] - CTA chest negative for PE.  Did show increased diffuse pulmonary septal thickening and underlying edema versus atypical infection. - She was started on antibiotics and steroids on admission.  Of note, has been tolerating azithromycin in context of history of erythromycin allergy -RVP negative as well  Atrial fibrillation with rapid ventricular response (HCC) CHA2DS2-VASc Score of at least 5 - started on cardizem gtt on admission - Eliquis also initiated as well  - echo obtained on admission - cardiology following  - TSH is slightly suppressed; will order FT4 and TT3; patient denies any prior thyroid disease nor in the familiy  - cardizem drip being transitioned to Toprol per  cardiology  - amio to be avoided with underlying lung disease  Subclinical hyperthyroidism -TSH mildly suppressed, 0.243. -Free T4 normal range, 0.71 -Total T3 pending -No further work-up at this time.  Repeat thyroid studies in 2 to 3 months   Essential hypertension - continue toprol    Breast pain Continue oxycodone as needed.  Depression with anxiety -Database reviewed.  Sporadic fills of alprazolam 0.5 mg; last filled 01/08/2022 -resuming nightly alprazolam as per home use    GERD (gastroesophageal reflux disease) Continue pantoprazole 40 mg p.o. daily.   Hyperlipidemia Currently not taking atorvastatin.  Reminded patient her CAD is being treated with medical management.  She was willing to consider restarting Lipitor   Old records reviewed in assessment of this patient  Antimicrobials: Azithro 9/12 >> current Rocephin 9/12 >> current   DVT prophylaxis:   apixaban (ELIQUIS) tablet 5 mg   Code Status:   Code Status: Full Code  Mobility Assessment (last 72 hours)     Mobility Assessment     Row Name 02/03/22 2000 02/02/22 2000         Does patient have an order for bedrest or is patient medically unstable No - Continue assessment No - Continue assessment      What is the highest level of mobility based on the progressive mobility assessment? Level 3 (Stands with assist) - Balance while standing  and cannot march in place Level 3 (Stands with assist) - Balance while standing  and cannot march in place               Barriers  to discharge: none Disposition Plan:  Home Status is: Inpt  Objective: Blood pressure (!) 110/53, pulse 82, temperature 98.6 F (37 C), temperature source Oral, resp. rate (!) 28, SpO2 91 %.  Examination:  Physical Exam Constitutional:      Appearance: She is well-developed.  HENT:     Head: Normocephalic and atraumatic.     Mouth/Throat:     Mouth: Mucous membranes are moist.  Eyes:     Extraocular Movements: Extraocular  movements intact.  Cardiovascular:     Rate and Rhythm: Normal rate. Rhythm irregular.  Pulmonary:     Effort: Pulmonary effort is normal.  Abdominal:     General: Bowel sounds are normal. There is no distension.     Palpations: Abdomen is soft.     Tenderness: There is no abdominal tenderness.  Musculoskeletal:        General: Normal range of motion.     Cervical back: Normal range of motion and neck supple.  Skin:    General: Skin is warm and dry.  Neurological:     General: No focal deficit present.     Mental Status: She is alert.  Psychiatric:        Mood and Affect: Mood normal.      Consultants:  Cardiology Pulmonology   Procedures:    Data Reviewed: Results for orders placed or performed during the hospital encounter of 02/02/22 (from the past 24 hour(s))  Basic metabolic panel     Status: Abnormal   Collection Time: 02/05/22  7:28 AM  Result Value Ref Range   Sodium 140 135 - 145 mmol/L   Potassium 4.3 3.5 - 5.1 mmol/L   Chloride 98 98 - 111 mmol/L   CO2 37 (H) 22 - 32 mmol/L   Glucose, Bld 89 70 - 99 mg/dL   BUN 16 8 - 23 mg/dL   Creatinine, Ser 0.45 0.44 - 1.00 mg/dL   Calcium 8.8 (L) 8.9 - 10.3 mg/dL   GFR, Estimated >60 >60 mL/min   Anion gap 5 5 - 15  CBC with Differential/Platelet     Status: Abnormal   Collection Time: 02/05/22  7:28 AM  Result Value Ref Range   WBC 12.2 (H) 4.0 - 10.5 K/uL   RBC 4.39 3.87 - 5.11 MIL/uL   Hemoglobin 13.2 12.0 - 15.0 g/dL   HCT 42.6 36.0 - 46.0 %   MCV 97.0 80.0 - 100.0 fL   MCH 30.1 26.0 - 34.0 pg   MCHC 31.0 30.0 - 36.0 g/dL   RDW 16.1 (H) 11.5 - 15.5 %   Platelets 230 150 - 400 K/uL   nRBC 0.0 0.0 - 0.2 %   Neutrophils Relative % 74 %   Neutro Abs 9.1 (H) 1.7 - 7.7 K/uL   Lymphocytes Relative 17 %   Lymphs Abs 2.1 0.7 - 4.0 K/uL   Monocytes Relative 8 %   Monocytes Absolute 0.9 0.1 - 1.0 K/uL   Eosinophils Relative 1 %   Eosinophils Absolute 0.1 0.0 - 0.5 K/uL   Basophils Relative 0 %   Basophils  Absolute 0.0 0.0 - 0.1 K/uL   Immature Granulocytes 0 %   Abs Immature Granulocytes 0.05 0.00 - 0.07 K/uL  Magnesium     Status: None   Collection Time: 02/05/22  7:28 AM  Result Value Ref Range   Magnesium 2.0 1.7 - 2.4 mg/dL    I have Reviewed nursing notes, Vitals, and Lab results since pt's last encounter. Pertinent lab results :  see above I have ordered test including BMP, CBC, Mg I have reviewed the last note from staff over past 24 hours I have discussed pt's care plan and test results with nursing staff, case manager   LOS: 3 days   Dwyane Dee, MD [facility] 02/05/2022, 3:00 PM

## 2022-02-05 NOTE — Progress Notes (Signed)
Pt on Salter on 10L O2

## 2022-02-06 ENCOUNTER — Inpatient Hospital Stay (HOSPITAL_COMMUNITY): Payer: Medicare Other

## 2022-02-06 DIAGNOSIS — J441 Chronic obstructive pulmonary disease with (acute) exacerbation: Secondary | ICD-10-CM | POA: Diagnosis not present

## 2022-02-06 DIAGNOSIS — J9622 Acute and chronic respiratory failure with hypercapnia: Secondary | ICD-10-CM | POA: Diagnosis not present

## 2022-02-06 DIAGNOSIS — J9621 Acute and chronic respiratory failure with hypoxia: Secondary | ICD-10-CM | POA: Diagnosis not present

## 2022-02-06 LAB — CBC WITH DIFFERENTIAL/PLATELET
Abs Immature Granulocytes: 0.04 10*3/uL (ref 0.00–0.07)
Basophils Absolute: 0 10*3/uL (ref 0.0–0.1)
Basophils Relative: 0 %
Eosinophils Absolute: 0.1 10*3/uL (ref 0.0–0.5)
Eosinophils Relative: 1 %
HCT: 39.7 % (ref 36.0–46.0)
Hemoglobin: 12.4 g/dL (ref 12.0–15.0)
Immature Granulocytes: 0 %
Lymphocytes Relative: 13 %
Lymphs Abs: 1.5 10*3/uL (ref 0.7–4.0)
MCH: 29.8 pg (ref 26.0–34.0)
MCHC: 31.2 g/dL (ref 30.0–36.0)
MCV: 95.4 fL (ref 80.0–100.0)
Monocytes Absolute: 0.8 10*3/uL (ref 0.1–1.0)
Monocytes Relative: 7 %
Neutro Abs: 8.9 10*3/uL — ABNORMAL HIGH (ref 1.7–7.7)
Neutrophils Relative %: 79 %
Platelets: 232 10*3/uL (ref 150–400)
RBC: 4.16 MIL/uL (ref 3.87–5.11)
RDW: 16.1 % — ABNORMAL HIGH (ref 11.5–15.5)
WBC: 11.3 10*3/uL — ABNORMAL HIGH (ref 4.0–10.5)
nRBC: 0 % (ref 0.0–0.2)

## 2022-02-06 LAB — BASIC METABOLIC PANEL
Anion gap: 5 (ref 5–15)
BUN: 16 mg/dL (ref 8–23)
CO2: 37 mmol/L — ABNORMAL HIGH (ref 22–32)
Calcium: 8.8 mg/dL — ABNORMAL LOW (ref 8.9–10.3)
Chloride: 95 mmol/L — ABNORMAL LOW (ref 98–111)
Creatinine, Ser: 0.36 mg/dL — ABNORMAL LOW (ref 0.44–1.00)
GFR, Estimated: >60 mL/min (ref >60)
Glucose, Bld: 105 mg/dL — ABNORMAL HIGH (ref 70–99)
Potassium: 4.5 mmol/L (ref 3.5–5.1)
Sodium: 137 mmol/L (ref 135–145)

## 2022-02-06 LAB — MAGNESIUM: Magnesium: 2 mg/dL (ref 1.7–2.4)

## 2022-02-06 MED ORDER — ORAL CARE MOUTH RINSE
15.0000 mL | OROMUCOSAL | Status: DC
  Administered 2022-02-06 – 2022-02-10 (×15): 15 mL via OROMUCOSAL

## 2022-02-06 MED ORDER — FUROSEMIDE 10 MG/ML IJ SOLN
20.0000 mg | Freq: Once | INTRAMUSCULAR | Status: AC
  Administered 2022-02-06: 20 mg via INTRAVENOUS
  Filled 2022-02-06: qty 2

## 2022-02-06 MED ORDER — ORAL CARE MOUTH RINSE: 15.0000 mL | OROMUCOSAL | Status: DC | PRN

## 2022-02-06 NOTE — Progress Notes (Signed)
PT cannot tolerate Heated High Flow 02 system. RN aware.

## 2022-02-06 NOTE — Progress Notes (Signed)
Progress Note    Caroline Greer   NLG:[id number]  DOB: [birth year]  DOA: 02/02/2022     4 PCP: [name], MD  Initial CC: SOB, right chest pain  Hospital Course: Caroline Greer is a 66 yo female with PMH COPD, chronic hypoxic respiratory failure, right breast cancer s/p lumpectomy and radiation, depression/anxiety, HTN, HLD, tobacco use, and nonobstructive CAD managed medically (last cath 09/2019) who presented with right-sided chest pain.  She was also short of breath.  She was found to be hypoxic with SPO2 in the 40s on EMS arrival initially.  She was reporting occasional dry cough but no sputum nor fevers, chills. She was placed on BiPAP and brought to the hospital for further evaluation. During work-up she was also noted to be in new onset A-fib with RVR. She underwent evaluations by pulmonology and cardiology as well.  Interval History:  No events overnight.  Still back on nonrebreather on salter high flow this morning.  Eventually desats when takes off nonrebreather. Feels okay otherwise and asking when she can go home.  Assessment and Plan:  Acute on chronic respiratory failure with hypoxia and hypercapnia (HCC) COPD exacerbation [facility]) -Pulmonology consulted on admission as well; patient follows with Dr. [name] - CTA chest negative for PE.  Did show increased diffuse pulmonary septal thickening and underlying edema versus atypical infection. - She was started on antibiotics and steroids on admission.  Of note, has been tolerating azithromycin in context of history of erythromycin allergy -RVP negative as well -Still having difficulty weaning oxygen.  Repeat CXR this morning shows improvement since prior in fact with less prominent interstitial markings consistent with improved edema - Giving low-dose IV Lasix today in efforts to further help wean oxygen  Atrial fibrillation with rapid ventricular response (HCC) CHA2DS2-VASc Score of at least 5 - started on cardizem gtt on  admission - Eliquis also initiated as well  - echo obtained on admission - cardiology following  - TSH is slightly suppressed; will order FT4 and TT3; patient denies any prior thyroid disease nor in the familiy  - cardizem drip being transitioned to Toprol per cardiology  - amio to be avoided with underlying lung disease  Subclinical hyperthyroidism -TSH mildly suppressed, 0.243. -Free T4 normal range, 0.71 -Total T3 pending -No further work-up at this time.  Repeat thyroid studies in 2 to 3 months   Essential hypertension - continue toprol    Breast pain Continue oxycodone as needed.  Depression with anxiety -Database reviewed.  Sporadic fills of alprazolam 0.5 mg; last filled 01/08/2022 -resuming nightly alprazolam as per home use    GERD (gastroesophageal reflux disease) Continue pantoprazole 40 mg p.o. daily.   Hyperlipidemia Currently not taking atorvastatin.  Reminded patient her CAD is being treated with medical management.  She was willing to consider restarting Lipitor   Old records reviewed in assessment of this patient  Antimicrobials: Azithro 9/12 >> 02/06/2022 Rocephin 9/12 >> 02/07/2022  DVT prophylaxis:   apixaban (ELIQUIS) tablet 5 mg   Code Status:   Code Status: Full Code  Mobility Assessment (last 72 hours)     Mobility Assessment     Row Name 02/03/22 2000           Does patient have an order for bedrest or is patient medically unstable No - Continue assessment       What is the highest level of mobility based on the progressive mobility assessment? Level 3 (Stands with assist) - Balance while standing  and cannot  march in place                Barriers to discharge: none Disposition Plan:  Home Status is: Inpt  Objective: Blood pressure 127/72, pulse 85, temperature 98.5 F (36.9 C), temperature source Oral, resp. rate 16, SpO2 94 %.  Examination:  Physical Exam Constitutional:      Appearance: She is well-developed.  HENT:      Head: Normocephalic and atraumatic.     Mouth/Throat:     Mouth: Mucous membranes are moist.  Eyes:     Extraocular Movements: Extraocular movements intact.  Cardiovascular:     Rate and Rhythm: Normal rate. Rhythm irregular.  Pulmonary:     Effort: Pulmonary effort is normal.  Abdominal:     General: Bowel sounds are normal. There is no distension.     Palpations: Abdomen is soft.     Tenderness: There is no abdominal tenderness.  Musculoskeletal:        General: Normal range of motion.     Cervical back: Normal range of motion and neck supple.  Skin:    General: Skin is warm and dry.  Neurological:     General: No focal deficit present.     Mental Status: She is alert.  Psychiatric:        Mood and Affect: Mood normal.      Consultants:  Cardiology Pulmonology   Procedures:    Data Reviewed: Results for orders placed or performed during the hospital encounter of 02/02/22 (from the past 24 hour(s))  Troponin I (High Sensitivity)     Status: None   Collection Time: 02/05/22  8:59 PM  Result Value Ref Range   Troponin I (High Sensitivity) 6 <18 ng/L  Troponin I (High Sensitivity)     Status: None   Collection Time: 02/05/22 10:39 PM  Result Value Ref Range   Troponin I (High Sensitivity) 6 <18 ng/L  Basic metabolic panel     Status: Abnormal   Collection Time: 02/06/22  3:10 AM  Result Value Ref Range   Sodium 137 135 - 145 mmol/L   Potassium 4.5 3.5 - 5.1 mmol/L   Chloride 95 (L) 98 - 111 mmol/L   CO2 37 (H) 22 - 32 mmol/L   Glucose, Bld 105 (H) 70 - 99 mg/dL   BUN 16 8 - 23 mg/dL   Creatinine, Ser 0.36 (L) 0.44 - 1.00 mg/dL   Calcium 8.8 (L) 8.9 - 10.3 mg/dL   GFR, Estimated >60 >60 mL/min   Anion gap 5 5 - 15  CBC with Differential/Platelet     Status: Abnormal   Collection Time: 02/06/22  3:10 AM  Result Value Ref Range   WBC 11.3 (H) 4.0 - 10.5 K/uL   RBC 4.16 3.87 - 5.11 MIL/uL   Hemoglobin 12.4 12.0 - 15.0 g/dL   HCT 39.7 36.0 - 46.0 %   MCV 95.4  80.0 - 100.0 fL   MCH 29.8 26.0 - 34.0 pg   MCHC 31.2 30.0 - 36.0 g/dL   RDW 16.1 (H) 11.5 - 15.5 %   Platelets 232 150 - 400 K/uL   nRBC 0.0 0.0 - 0.2 %   Neutrophils Relative % 79 %   Neutro Abs 8.9 (H) 1.7 - 7.7 K/uL   Lymphocytes Relative 13 %   Lymphs Abs 1.5 0.7 - 4.0 K/uL   Monocytes Relative 7 %   Monocytes Absolute 0.8 0.1 - 1.0 K/uL   Eosinophils Relative 1 %   Eosinophils  Absolute 0.1 0.0 - 0.5 K/uL   Basophils Relative 0 %   Basophils Absolute 0.0 0.0 - 0.1 K/uL   Immature Granulocytes 0 %   Abs Immature Granulocytes 0.04 0.00 - 0.07 K/uL  Magnesium     Status: None   Collection Time: 02/06/22  3:10 AM  Result Value Ref Range   Magnesium 2.0 1.7 - 2.4 mg/dL    I have Reviewed nursing notes, Vitals, and Lab results since pt's last encounter. Pertinent lab results : see above I have ordered test including BMP, CBC, Mg I have reviewed the last note from staff over past 24 hours I have discussed pt's care plan and test results with nursing staff, case manager   LOS: 4 days   Dwyane Dee, MD [facility] 02/06/2022, 2:37 PM

## 2022-02-07 DIAGNOSIS — J9622 Acute and chronic respiratory failure with hypercapnia: Secondary | ICD-10-CM | POA: Diagnosis not present

## 2022-02-07 DIAGNOSIS — J700 Acute pulmonary manifestations due to radiation: Secondary | ICD-10-CM | POA: Diagnosis not present

## 2022-02-07 DIAGNOSIS — J9621 Acute and chronic respiratory failure with hypoxia: Secondary | ICD-10-CM | POA: Diagnosis not present

## 2022-02-07 LAB — CBC WITH DIFFERENTIAL/PLATELET
Abs Immature Granulocytes: 0.05 10*3/uL (ref 0.00–0.07)
Basophils Absolute: 0 10*3/uL (ref 0.0–0.1)
Basophils Relative: 0 %
Eosinophils Absolute: 0.1 10*3/uL (ref 0.0–0.5)
Eosinophils Relative: 1 %
HCT: 40.8 % (ref 36.0–46.0)
Hemoglobin: 12.6 g/dL (ref 12.0–15.0)
Immature Granulocytes: 1 %
Lymphocytes Relative: 15 %
Lymphs Abs: 1.6 10*3/uL (ref 0.7–4.0)
MCH: 29.4 pg (ref 26.0–34.0)
MCHC: 30.9 g/dL (ref 30.0–36.0)
MCV: 95.3 fL (ref 80.0–100.0)
Monocytes Absolute: 0.9 10*3/uL (ref 0.1–1.0)
Monocytes Relative: 8 %
Neutro Abs: 7.8 10*3/uL — ABNORMAL HIGH (ref 1.7–7.7)
Neutrophils Relative %: 75 %
Platelets: 261 10*3/uL (ref 150–400)
RBC: 4.28 MIL/uL (ref 3.87–5.11)
RDW: 16.6 % — ABNORMAL HIGH (ref 11.5–15.5)
WBC: 10.5 10*3/uL (ref 4.0–10.5)
nRBC: 0 % (ref 0.0–0.2)

## 2022-02-07 LAB — BASIC METABOLIC PANEL
Anion gap: 6 (ref 5–15)
BUN: 20 mg/dL (ref 8–23)
CO2: 39 mmol/L — ABNORMAL HIGH (ref 22–32)
Calcium: 9 mg/dL (ref 8.9–10.3)
Chloride: 96 mmol/L — ABNORMAL LOW (ref 98–111)
Creatinine, Ser: 0.55 mg/dL (ref 0.44–1.00)
GFR, Estimated: >60 mL/min (ref >60)
Glucose, Bld: 99 mg/dL (ref 70–99)
Potassium: 4.5 mmol/L (ref 3.5–5.1)
Sodium: 141 mmol/L (ref 135–145)

## 2022-02-07 LAB — CULTURE, BLOOD (ROUTINE X 2)
Culture: NO GROWTH
Culture: NO GROWTH
Special Requests: ADEQUATE

## 2022-02-07 LAB — MAGNESIUM: Magnesium: 2.2 mg/dL (ref 1.7–2.4)

## 2022-02-07 MED ORDER — REVEFENACIN 175 MCG/3ML IN SOLN
175.0000 ug | Freq: Every day | RESPIRATORY_TRACT | Status: DC
  Administered 2022-02-08 – 2022-02-10 (×3): 175 ug via RESPIRATORY_TRACT
  Filled 2022-02-07 (×4): qty 3

## 2022-02-07 MED ORDER — PREDNISONE 20 MG PO TABS
40.0000 mg | ORAL_TABLET | Freq: Every day | ORAL | Status: DC
  Administered 2022-02-08: 40 mg via ORAL
  Filled 2022-02-07: qty 2

## 2022-02-07 MED ORDER — BUDESONIDE 0.5 MG/2ML IN SUSP
0.5000 mg | Freq: Two times a day (BID) | RESPIRATORY_TRACT | Status: DC
  Administered 2022-02-07 – 2022-02-10 (×6): 0.5 mg via RESPIRATORY_TRACT
  Filled 2022-02-07 (×6): qty 2

## 2022-02-07 NOTE — Progress Notes (Addendum)
NAME:  Caroline Greer, MRN:  [id number], DOB:  06/02/1955, LOS: 5 ADMISSION DATE:  02/02/2022, CONSULTATION DATE:  02/07/22 REFERRING MD:  Olevia Bowens, CHIEF COMPLAINT:  chest/breast pain   History of Present Illness:  Caroline Greer is a 66 y.o. 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  She wears 4L [place] O2 at baseline, denies any recent cough, increased sputum production, fever or other URI symptoms.  She states that she had the sudden onset of severe R breast pain that is radiating to her R chest wall yesterday.  The pain is so intense it caused shortness of breath.  She was treated with Keflex for cellulitis of the R breast during her hospitalization for COPD exacerbation and pleural effusion requiring thoracentesis last month and her symptoms improved.  The pain this time is much more severe.   ED course included CTA chest which was negative for PE, showed improved pleural effusions, diffuse pulmonary septal thickening.   Her HR converted to afib with RVR and she was started on Cardizem gtt and Bipap and given Ceftriaxone/azithromycin.  Labs showed Troponin of 5, BNP 26, procal <0.10, lactic acid 1.0, WBC 11.6.   PCCM consulted in this setting  Pertinent  Medical History  Chronic respiratory failure with hypoxia on 4L - sees Icard Breast cancer Constipation COPD exacerbation Depression with anxiety Essential hypertension  History of radiation therapy Hyperlipidemia  Hypertension   Tobacco dependence due to cigarettes   Significant Hospital Events: Including procedures, antibiotic start and stop dates in addition to other pertinent events   8/22 right thoracentesis  9/12 admit to stepdown with hypoxic respiratory failure, PCCM consult 9/13 No acute issues overnight, on 4L [place] this am    Interim History / Subjective:   Last seen 9/13. We are reconsulted for persistent hypoxia She is continuing to require 8 L nasal cannula on BiPAP at 60% during sleep  Objective   Blood pressure 138/73, pulse 80, temperature 98.4 F (36.9 C), temperature source Oral, resp. rate 19, SpO2 100 %.    FiO2 (%):  [60 %] 60 %   Intake/Output Summary (Last 24 hours) at 02/07/2022 1150 Last data filed at 02/07/2022 0747 Gross per 24 hour  Intake 700 ml  Output 2500 ml  Net -1800 ml    There were no vitals filed for this visit.  Physical Exam  General: Chronically ill-appearing woman, sitting up in bed, no distress HEENT: [place]/AT, MM pink/moist, PERRL, on liters nasal cannula Neuro: Alert and oriented x3, non-focal,  CV: S1-S2 distant, no murmur PULM: Able to speak in full sentences, decreased breath sounds bilateral, no crackles or rhonchi GI: soft, bowel sounds active in all 4 quadrants, non-tender, non-distended, Extremities: warm/dry, no edema  Skin: Radiation changes over right breast and back   CTA chest 9/12 reviewed , decreased bilateral effusions, mild right basilar atelectasis, increased septal thickening specially right upper lobe  Resolved Hospital Problem list     Assessment & Plan:  Cause for persistent hypoxia is unclear, she has increased septal thickening with suggest radiation pneumonitis in the right upper lobe-she certainly has skin changes of radiation on the right.  Last radiation was May 2023 so time course would be consistent.  She does not have definite ILD.  She does not have active bronchospasm.  Pleural  effusions were noted to be transudative , last visit 12/2021 she felt much improved with diuresis.  Echo shows normal LV function, BNP was low   Acute on Chronic Hypoxic Respiratory Failure  COPD exacerbation  Chest pain on inspiration due to severe R breast and chest wall pain Radiation pneumonitis Tobacco Use -Sees Dr. [name], PFT's pending, on Breztri,  flonase, proair at home -RVP negative  -Completed 5 days of antibiotics P: Wean supplemental oxygen to maintain SpO2 of 88-92% Continue PRN BIPAP  Add budesonide, Brovana and Yupelri -to maximize triple therapy Continue prednisone 40 mg until hypoxia improves and then longer course of 20 mg for 2 to 4 weeks depending on progress     R breast pain History of R breast Ca -Completed radiation in May, currently on Exemestane. Breast is tender and erythematous -Right breast US negative for masses or evidence of abscess, follow up mammogram recommended  P: Continue antibiotics as above  Ensure adequate pain control   Atrial Fibrillation -New onset, back in sinus rhythm P: Primary management per Cardiology/primary team  Eliquis and low dose beta blocker started 9/11 Avoid Amiodarone       Best Practice (right click and "Reselect all SmartList Selections" daily)   Per primary Code Status:  full code Last date of multidisciplinary goals of care discussion [pending]   Kara Mead MD. Shade Flood. [facility] Pager : [phone]  If no response to pager , please call [phone] until 7 pm After 7:00 pm call Elink  [phone]     02/07/2022, 11:50 AM

## 2022-02-07 NOTE — Plan of Care (Signed)
  Problem: Education: Goal: Knowledge of General Education information will improve Description: Including pain rating scale, medication(s)/side effects and non-pharmacologic comfort measures Outcome: Progressing   Problem: Clinical Measurements: Goal: Respiratory complications will improve Outcome: Progressing Goal: Cardiovascular complication will be avoided Outcome: Progressing   Problem: Coping: Goal: Level of anxiety will decrease Outcome: Progressing

## 2022-02-07 NOTE — Progress Notes (Addendum)
Progress Note    Caroline Greer   EYC:[id number]  DOB: [birth year]  DOA: 02/02/2022     5 PCP: [name], MD  Initial CC: SOB, right chest pain  Hospital Course: Caroline Greer is a 66 yo female with PMH COPD, chronic hypoxic respiratory failure, right breast cancer s/p lumpectomy and radiation, depression/anxiety, HTN, HLD, tobacco use, and nonobstructive CAD managed medically (last cath 09/2019) who presented with right-sided chest pain.  She was also short of breath.  She was found to be hypoxic with SPO2 in the 40s on EMS arrival initially.  She was reporting occasional dry cough but no sputum nor fevers, chills. She was placed on BiPAP and brought to the hospital for further evaluation. During work-up she was also noted to be in new onset A-fib with RVR. She underwent evaluations by pulmonology and cardiology as well.  Interval History:  No events overnight.  Patient was tangled in cords this morning and upon trying to untangle and move around in bed quite a bit, she desaturated almost down into the 60s without nonrebreather.  Upon replacing nonrebreather, saturations improved back to the 90s. At rest with 8 L, she seems to do okay but any exertion she desaturates.  Assessment and Plan:  Acute on chronic respiratory failure with hypoxia and hypercapnia (HCC) COPD exacerbation [facility]) -Pulmonology consulted on admission as well; patient follows with Dr. [name] - CTA chest negative for PE.  Did show increased diffuse pulmonary septal thickening and underlying edema versus atypical infection. - She was started on antibiotics and steroids on admission.  Of note, has been tolerating azithromycin in context of history of erythromycin allergy -Antibiotic course has been completed -RVP negative as well -Still having difficulty weaning oxygen.  Diuresed well with Lasix but still no improvement in oxygen requirements -Resumed back on extended course of steroids per pulmonology and further  breathing treatments added - Continue monitoring response to above  Atrial fibrillation with rapid ventricular response (HCC) CHA2DS2-VASc Score of at least 5 - started on cardizem gtt on admission - Eliquis also initiated as well  - echo obtained on admission - cardiology following  - cardizem drip being transitioned to Toprol per cardiology  - amio to be avoided with underlying lung disease  Subclinical hyperthyroidism -TSH mildly suppressed, 0.243. -Free T4 normal range, 0.71 -Total T3 is 93 (normal range) -No further work-up at this time.  Repeat thyroid studies in 2 to 3 months   Essential hypertension - continue toprol    Breast pain Continue oxycodone as needed.  Depression with anxiety -Database reviewed.  Sporadic fills of alprazolam 0.5 mg; last filled 01/08/2022 -resuming nightly alprazolam as per home use    GERD (gastroesophageal reflux disease) Continue pantoprazole 40 mg p.o. daily.   Hyperlipidemia Currently not taking atorvastatin.  Reminded patient her CAD is being treated with medical management.  She was willing to consider restarting Lipitor   Old records reviewed in assessment of this patient  Antimicrobials: Azithro 9/12 >> 02/06/2022 Rocephin 9/12 >> 02/07/2022  DVT prophylaxis:   apixaban (ELIQUIS) tablet 5 mg   Code Status:   Code Status: Full Code  Mobility Assessment (last 72 hours)     Mobility Assessment   No documentation.           Barriers to discharge: none Disposition Plan:  Home Status is: Inpt  Objective: Blood pressure (!) 123/59, pulse 86, temperature 98.5 F (36.9 C), temperature source Oral, resp. rate 20, SpO2 98 %.  Examination:  Physical Exam  Constitutional:      Appearance: She is well-developed.  HENT:     Head: Normocephalic and atraumatic.     Mouth/Throat:     Mouth: Mucous membranes are moist.  Eyes:     Extraocular Movements: Extraocular movements intact.  Cardiovascular:     Rate and Rhythm:  Normal rate and regular rhythm.  Pulmonary:     Effort: Pulmonary effort is normal.     Comments: Coarse breath sounds Abdominal:     General: Bowel sounds are normal. There is no distension.     Palpations: Abdomen is soft.     Tenderness: There is no abdominal tenderness.  Musculoskeletal:        General: Normal range of motion.     Cervical back: Normal range of motion and neck supple.  Skin:    General: Skin is warm and dry.  Neurological:     General: No focal deficit present.     Mental Status: She is alert.  Psychiatric:        Mood and Affect: Mood normal.      Consultants:  Cardiology Pulmonology   Procedures:    Data Reviewed: Results for orders placed or performed during the hospital encounter of 02/02/22 (from the past 24 hour(s))  Basic metabolic panel     Status: Abnormal   Collection Time: 02/07/22  3:07 AM  Result Value Ref Range   Sodium 141 135 - 145 mmol/L   Potassium 4.5 3.5 - 5.1 mmol/L   Chloride 96 (L) 98 - 111 mmol/L   CO2 39 (H) 22 - 32 mmol/L   Glucose, Bld 99 70 - 99 mg/dL   BUN 20 8 - 23 mg/dL   Creatinine, Ser 0.55 0.44 - 1.00 mg/dL   Calcium 9.0 8.9 - 10.3 mg/dL   GFR, Estimated >60 >60 mL/min   Anion gap 6 5 - 15  CBC with Differential/Platelet     Status: Abnormal   Collection Time: 02/07/22  3:07 AM  Result Value Ref Range   WBC 10.5 4.0 - 10.5 K/uL   RBC 4.28 3.87 - 5.11 MIL/uL   Hemoglobin 12.6 12.0 - 15.0 g/dL   HCT 40.8 36.0 - 46.0 %   MCV 95.3 80.0 - 100.0 fL   MCH 29.4 26.0 - 34.0 pg   MCHC 30.9 30.0 - 36.0 g/dL   RDW 16.6 (H) 11.5 - 15.5 %   Platelets 261 150 - 400 K/uL   nRBC 0.0 0.0 - 0.2 %   Neutrophils Relative % 75 %   Neutro Abs 7.8 (H) 1.7 - 7.7 K/uL   Lymphocytes Relative 15 %   Lymphs Abs 1.6 0.7 - 4.0 K/uL   Monocytes Relative 8 %   Monocytes Absolute 0.9 0.1 - 1.0 K/uL   Eosinophils Relative 1 %   Eosinophils Absolute 0.1 0.0 - 0.5 K/uL   Basophils Relative 0 %   Basophils Absolute 0.0 0.0 - 0.1 K/uL    Immature Granulocytes 1 %   Abs Immature Granulocytes 0.05 0.00 - 0.07 K/uL  Magnesium     Status: None   Collection Time: 02/07/22  3:07 AM  Result Value Ref Range   Magnesium 2.2 1.7 - 2.4 mg/dL    I have Reviewed nursing notes, Vitals, and Lab results since pt's last encounter. Pertinent lab results : see above I have ordered test including BMP, CBC, Mg I have reviewed the last note from staff over past 24 hours I have discussed pt's care plan and test  results with nursing staff, case manager   LOS: 5 days   Dwyane Dee, MD [facility] 02/07/2022, 2:29 PM

## 2022-02-08 DIAGNOSIS — I4891 Unspecified atrial fibrillation: Secondary | ICD-10-CM | POA: Diagnosis not present

## 2022-02-08 DIAGNOSIS — E0590 Thyrotoxicosis, unspecified without thyrotoxic crisis or storm: Secondary | ICD-10-CM | POA: Diagnosis not present

## 2022-02-08 DIAGNOSIS — J9621 Acute and chronic respiratory failure with hypoxia: Secondary | ICD-10-CM | POA: Diagnosis not present

## 2022-02-08 DIAGNOSIS — J9622 Acute and chronic respiratory failure with hypercapnia: Secondary | ICD-10-CM | POA: Diagnosis not present

## 2022-02-08 LAB — CBC WITH DIFFERENTIAL/PLATELET
Abs Immature Granulocytes: 0.04 10*3/uL (ref 0.00–0.07)
Basophils Absolute: 0 10*3/uL (ref 0.0–0.1)
Basophils Relative: 0 %
Eosinophils Absolute: 0.2 10*3/uL (ref 0.0–0.5)
Eosinophils Relative: 2 %
HCT: 42.8 % (ref 36.0–46.0)
Hemoglobin: 13.2 g/dL (ref 12.0–15.0)
Immature Granulocytes: 0 %
Lymphocytes Relative: 19 %
Lymphs Abs: 1.9 10*3/uL (ref 0.7–4.0)
MCH: 29.2 pg (ref 26.0–34.0)
MCHC: 30.8 g/dL (ref 30.0–36.0)
MCV: 94.7 fL (ref 80.0–100.0)
Monocytes Absolute: 0.9 10*3/uL (ref 0.1–1.0)
Monocytes Relative: 9 %
Neutro Abs: 7 10*3/uL (ref 1.7–7.7)
Neutrophils Relative %: 70 %
Platelets: 281 10*3/uL (ref 150–400)
RBC: 4.52 MIL/uL (ref 3.87–5.11)
RDW: 16.7 % — ABNORMAL HIGH (ref 11.5–15.5)
WBC: 10 10*3/uL (ref 4.0–10.5)
nRBC: 0 % (ref 0.0–0.2)

## 2022-02-08 LAB — BASIC METABOLIC PANEL
Anion gap: 6 (ref 5–15)
BUN: 17 mg/dL (ref 8–23)
CO2: 41 mmol/L — ABNORMAL HIGH (ref 22–32)
Calcium: 9.1 mg/dL (ref 8.9–10.3)
Chloride: 96 mmol/L — ABNORMAL LOW (ref 98–111)
Creatinine, Ser: 0.52 mg/dL (ref 0.44–1.00)
GFR, Estimated: >60 mL/min (ref >60)
Glucose, Bld: 83 mg/dL (ref 70–99)
Potassium: 4.3 mmol/L (ref 3.5–5.1)
Sodium: 143 mmol/L (ref 135–145)

## 2022-02-08 LAB — MAGNESIUM: Magnesium: 2 mg/dL (ref 1.7–2.4)

## 2022-02-08 MED ORDER — STERILE WATER FOR INJECTION IJ SOLN
INTRAMUSCULAR | Status: AC
  Filled 2022-02-08: qty 10

## 2022-02-08 MED ORDER — FUROSEMIDE 10 MG/ML IJ SOLN
40.0000 mg | Freq: Once | INTRAMUSCULAR | Status: AC
  Administered 2022-02-08: 40 mg via INTRAVENOUS
  Filled 2022-02-08: qty 4

## 2022-02-08 MED ORDER — ACETAZOLAMIDE SODIUM 500 MG IJ SOLR
250.0000 mg | Freq: Once | INTRAMUSCULAR | Status: AC
  Administered 2022-02-08: 250 mg via INTRAVENOUS
  Filled 2022-02-08: qty 250

## 2022-02-08 NOTE — Progress Notes (Signed)
Progress Note    Caroline Greer   GEZ:[id number]  DOB: June 25, 1955  DOA: 02/02/2022     6 PCP: [name], MD  Initial CC: SOB, right chest pain  Hospital Course: Caroline Greer is a 66 yo female with PMH COPD, chronic hypoxic respiratory failure, right breast cancer s/p lumpectomy and radiation, depression/anxiety, HTN, HLD, tobacco use, and nonobstructive CAD managed medically (last cath 09/2019) who presented with right-sided chest pain.  She was also short of breath.  She was found to be hypoxic with SPO2 in the 40s on EMS arrival initially.  She was reporting occasional dry cough but no sputum nor fevers, chills. She was placed on BiPAP and brought to the hospital for further evaluation. During work-up she was also noted to be in new onset A-fib with RVR. She underwent evaluations by pulmonology and cardiology as well.  Interval History:  No events overnight.  Breathing is comfortable this morning still remains on 8 L [place] high flow. She understands plan is for more Lasix along with some Diamox today.  Assessment and Plan:  Acute on chronic respiratory failure with hypoxia and hypercapnia (HCC) COPD exacerbation [facility]) -Pulmonology consulted on admission as well; patient follows with Dr. [name] - CTA chest negative for PE.  Did show increased diffuse pulmonary septal thickening and underlying edema versus atypical infection. - She was started on antibiotics and steroids on admission.  Of note, has been tolerating azithromycin in context of history of erythromycin allergy -Antibiotic course has been completed -RVP negative as well -Still having difficulty weaning oxygen.  Diuresed well with Lasix but still no improvement in oxygen requirements -Resumed back on extended course of steroids per pulmonology and further breathing treatments added - Continue monitoring response to above  Atrial fibrillation with rapid ventricular response (HCC) CHA2DS2-VASc Score of at least 5 -  started on cardizem gtt on admission - Eliquis also initiated as well  - echo obtained on admission - cardiology following  - cardizem drip being transitioned to Toprol per cardiology  - amio to be avoided with underlying lung disease  Subclinical hyperthyroidism -TSH mildly suppressed, 0.243. -Free T4 normal range, 0.71 -Total T3 is 93 (normal range) -No further work-up at this time.  Repeat thyroid studies in 2 to 3 months   Essential hypertension - continue toprol    Breast pain Continue oxycodone as needed.  Depression with anxiety -Database reviewed.  Sporadic fills of alprazolam 0.5 mg; last filled 01/08/2022 -resuming nightly alprazolam as per home use    GERD (gastroesophageal reflux disease) Continue pantoprazole 40 mg p.o. daily.   Hyperlipidemia Currently not taking atorvastatin.  Reminded patient her CAD is being treated with medical management.  She was willing to consider restarting Lipitor   Old records reviewed in assessment of this patient  Antimicrobials: Azithro 9/12 >> 02/06/2022 Rocephin 9/12 >> 02/07/2022  DVT prophylaxis:   apixaban (ELIQUIS) tablet 5 mg   Code Status:   Code Status: Full Code  Mobility Assessment (last 72 hours)     Mobility Assessment     Row Name 02/08/22 0800           Does patient have an order for bedrest or is patient medically unstable No - Continue assessment       What is the highest level of mobility based on the progressive mobility assessment? Level 3 (Stands with assist) - Balance while standing  and cannot march in place  Barriers to discharge: none Disposition Plan:  Home Status is: Inpt  Objective: Blood pressure 125/69, pulse 76, temperature 98.7 F (37.1 C), temperature source Oral, resp. rate (!) 21, SpO2 92 %.  Examination:  Physical Exam Constitutional:      Appearance: She is well-developed.  HENT:     Head: Normocephalic and atraumatic.     Mouth/Throat:     Mouth:  Mucous membranes are moist.  Eyes:     Extraocular Movements: Extraocular movements intact.  Cardiovascular:     Rate and Rhythm: Normal rate and regular rhythm.  Pulmonary:     Effort: Pulmonary effort is normal.     Comments: Coarse breath sounds Abdominal:     General: Bowel sounds are normal. There is no distension.     Palpations: Abdomen is soft.     Tenderness: There is no abdominal tenderness.  Musculoskeletal:        General: Normal range of motion.     Cervical back: Normal range of motion and neck supple.  Skin:    General: Skin is warm and dry.  Neurological:     General: No focal deficit present.     Mental Status: She is alert.  Psychiatric:        Mood and Affect: Mood normal.      Consultants:  Cardiology Pulmonology   Procedures:    Data Reviewed: Results for orders placed or performed during the hospital encounter of 02/02/22 (from the past 24 hour(s))  Basic metabolic panel     Status: Abnormal   Collection Time: 02/08/22  3:20 AM  Result Value Ref Range   Sodium 143 135 - 145 mmol/L   Potassium 4.3 3.5 - 5.1 mmol/L   Chloride 96 (L) 98 - 111 mmol/L   CO2 41 (H) 22 - 32 mmol/L   Glucose, Bld 83 70 - 99 mg/dL   BUN 17 8 - 23 mg/dL   Creatinine, Ser 0.52 0.44 - 1.00 mg/dL   Calcium 9.1 8.9 - 10.3 mg/dL   GFR, Estimated >60 >60 mL/min   Anion gap 6 5 - 15  CBC with Differential/Platelet     Status: Abnormal   Collection Time: 02/08/22  3:20 AM  Result Value Ref Range   WBC 10.0 4.0 - 10.5 K/uL   RBC 4.52 3.87 - 5.11 MIL/uL   Hemoglobin 13.2 12.0 - 15.0 g/dL   HCT 42.8 36.0 - 46.0 %   MCV 94.7 80.0 - 100.0 fL   MCH 29.2 26.0 - 34.0 pg   MCHC 30.8 30.0 - 36.0 g/dL   RDW 16.7 (H) 11.5 - 15.5 %   Platelets 281 150 - 400 K/uL   nRBC 0.0 0.0 - 0.2 %   Neutrophils Relative % 70 %   Neutro Abs 7.0 1.7 - 7.7 K/uL   Lymphocytes Relative 19 %   Lymphs Abs 1.9 0.7 - 4.0 K/uL   Monocytes Relative 9 %   Monocytes Absolute 0.9 0.1 - 1.0 K/uL    Eosinophils Relative 2 %   Eosinophils Absolute 0.2 0.0 - 0.5 K/uL   Basophils Relative 0 %   Basophils Absolute 0.0 0.0 - 0.1 K/uL   Immature Granulocytes 0 %   Abs Immature Granulocytes 0.04 0.00 - 0.07 K/uL  Magnesium     Status: None   Collection Time: 02/08/22  3:20 AM  Result Value Ref Range   Magnesium 2.0 1.7 - 2.4 mg/dL    I have Reviewed nursing notes, Vitals, and Lab results since  pt's last encounter. Pertinent lab results : see above I have ordered test including BMP, CBC, Mg I have reviewed the last note from staff over past 24 hours I have discussed pt's care plan and test results with nursing staff, case manager   LOS: 6 days   Dwyane Dee, MD [facility] 02/08/2022, 1:09 PM

## 2022-02-08 NOTE — Progress Notes (Signed)
NAME:  Caroline Greer, MRN:  [id number], DOB:  1955-12-10, LOS: 6 ADMISSION DATE:  02/02/2022, CONSULTATION DATE:  02/08/22 REFERRING MD:  Olevia Bowens, CHIEF COMPLAINT:  chest/breast pain   History of Present Illness:  Caroline Greer is a 66 y.o. 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  She wears 4L [place] O2 at baseline, denies any recent cough, increased sputum production, fever or other URI symptoms.  She states that she had the sudden onset of severe R breast pain that is radiating to her R chest wall yesterday.  The pain is so intense it caused shortness of breath.  She was treated with Keflex for cellulitis of the R breast during her hospitalization for COPD exacerbation and pleural effusion requiring thoracentesis last month and her symptoms improved.  The pain this time is much more severe.   ED course included CTA chest which was negative for PE, showed improved pleural effusions, diffuse pulmonary septal thickening.   Her HR converted to afib with RVR and she was started on Cardizem gtt and Bipap and given Ceftriaxone/azithromycin.  Labs showed Troponin of 5, BNP 26, procal <0.10, lactic acid 1.0, WBC 11.6.   PCCM consulted in this setting  Pertinent  Medical History  Chronic respiratory failure with hypoxia on 4L - sees Icard Breast cancer Constipation COPD exacerbation Depression with anxiety Essential hypertension  History of radiation therapy Hyperlipidemia  Hypertension   Tobacco dependence due to cigarettes   Significant Hospital Events: Including procedures, antibiotic start and stop dates in addition to other pertinent events   8/22 right thoracentesis  9/12 admit to stepdown with hypoxic respiratory failure, PCCM consult 9/13 No acute issues overnight, on 4L [place] this am    Interim History / Subjective:  Feels improved subjectively, certainly better from earlier in admission. HR stable. O2 sat 96% on 8L.  Objective   Blood pressure 119/64, pulse 71, temperature 98.2 F (36.8 C), temperature source Oral, resp. rate 20, SpO2 97 %.    FiO2 (%):  [60 %] 60 %   Intake/Output Summary (Last 24 hours) at 02/08/2022 0733 Last data filed at 02/07/2022 2100 Gross per 24 hour  Intake 220 ml  Output --  Net 220 ml    There were no vitals filed for this visit.  Physical Exam  General: Chronically ill-appearing woman, resting in bed, no distress HEENT: [place]/AT, MM pink/moist, on 8 liters nasal cannula Neuro: Alert and oriented x3, non-focal,  CV: RRR, no M/R/G PULM: Diminished bilaterally GI: soft, bowel sounds active in all 4 quadrants, non-tender, non-distended, Extremities: warm/dry, no edema  Skin: Radiation changes over right breast and back   CTA chest 9/12 reviewed , decreased bilateral effusions, mild right basilar atelectasis, increased septal thickening specially right upper lobe  Resolved Hospital Problem list     Assessment & Plan:  Cause for persistent hypoxia is unclear, she has increased septal thickening with suggest radiation pneumonitis in the right upper lobe-she certainly has skin changes of radiation on the right.  Last radiation was May 2023 so time course would be consistent.  She does not have definite ILD.  She does not have active bronchospasm.  Pleural effusions were noted to be transudative , last visit 12/2021 she felt much improved with diuresis.  Echo shows normal LV function, BNP was  low   Acute on Chronic Hypoxic Respiratory Failure  COPD exacerbation  Chest pain on inspiration due to severe R breast and chest wall pain Radiation pneumonitis Tobacco Use -Sees Dr. [name], PFT's pending, on Breztri, flonase, proair at home -RVP negative  -Completed 5 days of antibiotics P: Wean supplemental oxygen to maintain SpO2 of  88-92% Continue PRN BIPAP  Continue budesonide, Brovana and Yupelri in an effort to maximize triple therapy Continue prednisone 40 mg until hypoxia improves and then longer course of 20 mg for 2 to 4 weeks depending on progress Tobacco cessation counseling  R breast pain History of R breast Ca -Completed radiation in May, currently on Exemestane. Breast is tender and erythematous -Right breast US negative for masses or evidence of abscess, follow up mammogram recommended  P: Ensure adequate pain control  F/u mammogram  Atrial Fibrillation -New onset, back in sinus rhythm P: Primary management per Cardiology/primary team  Eliquis and low dose beta blocker started 9/11 Avoid Amiodarone    Best Practice (right click and "Reselect all SmartList Selections" daily)  Per primary team.    Montey Hora, [place] For pager details, please see AMION or use Epic chat  After 1900, please call [place] for cross coverage needs 02/08/2022, 7:37 AM

## 2022-02-09 ENCOUNTER — Telehealth: Payer: Self-pay | Admitting: Physician Assistant

## 2022-02-09 DIAGNOSIS — J9621 Acute and chronic respiratory failure with hypoxia: Secondary | ICD-10-CM | POA: Diagnosis not present

## 2022-02-09 DIAGNOSIS — J9622 Acute and chronic respiratory failure with hypercapnia: Secondary | ICD-10-CM | POA: Diagnosis not present

## 2022-02-09 DIAGNOSIS — I4891 Unspecified atrial fibrillation: Secondary | ICD-10-CM | POA: Diagnosis not present

## 2022-02-09 LAB — BLOOD GAS, VENOUS
Acid-Base Excess: 11.8 mmol/L — ABNORMAL HIGH (ref 0.0–2.0)
Bicarbonate: 41 mmol/L — ABNORMAL HIGH (ref 20.0–28.0)
O2 Saturation: 46.2 %
Patient temperature: 37.3
pCO2, Ven: 77 mmHg (ref 44–60)
pH, Ven: 7.34 (ref 7.25–7.43)
pO2, Ven: 36 mmHg (ref 32–45)

## 2022-02-09 LAB — BASIC METABOLIC PANEL
Anion gap: 11 (ref 5–15)
BUN: 20 mg/dL (ref 8–23)
CO2: 28 mmol/L (ref 22–32)
Calcium: 9.2 mg/dL (ref 8.9–10.3)
Chloride: 100 mmol/L (ref 98–111)
Creatinine, Ser: 0.44 mg/dL (ref 0.44–1.00)
GFR, Estimated: >60 mL/min (ref >60)
Glucose, Bld: 94 mg/dL (ref 70–99)
Potassium: 3.6 mmol/L (ref 3.5–5.1)
Sodium: 139 mmol/L (ref 135–145)

## 2022-02-09 MED ORDER — PREDNISONE 20 MG PO TABS
30.0000 mg | ORAL_TABLET | Freq: Every day | ORAL | Status: DC
  Administered 2022-02-09 – 2022-02-10 (×2): 30 mg via ORAL
  Filled 2022-02-09 (×2): qty 1

## 2022-02-09 MED ORDER — AMLODIPINE BESYLATE 5 MG PO TABS
5.0000 mg | ORAL_TABLET | Freq: Every day | ORAL | Status: DC
  Administered 2022-02-09 – 2022-02-10 (×2): 5 mg via ORAL
  Filled 2022-02-09 (×2): qty 1

## 2022-02-09 NOTE — Telephone Encounter (Signed)
Pt scheduled for HFU with Tammy on 9/26. This will be printed on discharge papers.

## 2022-02-09 NOTE — Progress Notes (Signed)
Pt seen and given scheduled nebulizer treatment which she tolerated well.  No increased wob/respiratory distress noted or voiced by patient.  Bipap remains in room on standby but not indicated at this time.

## 2022-02-09 NOTE — Progress Notes (Signed)
NAME:  Caroline Greer, MRN:  [id number], DOB:  June 08, 1955, LOS: 7 ADMISSION DATE:  02/02/2022, CONSULTATION DATE:  02/09/22 REFERRING MD:  Olevia Bowens, CHIEF COMPLAINT:  chest/breast pain   History of Present Illness:  Caroline Greer is a 66 y.o. 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  She wears 4L [place] O2 at baseline, denies any recent cough, increased sputum production, fever or other URI symptoms.  She states that she had the sudden onset of severe R breast pain that is radiating to her R chest wall yesterday.  The pain is so intense it caused shortness of breath.  She was treated with Keflex for cellulitis of the R breast during her hospitalization for COPD exacerbation and pleural effusion requiring thoracentesis last month and her symptoms improved.  The pain this time is much more severe.   ED course included CTA chest which was negative for PE, showed improved pleural effusions, diffuse pulmonary septal thickening.   Her HR converted to afib with RVR and she was started on Cardizem gtt and Bipap and given Ceftriaxone/azithromycin.  Labs showed Troponin of 5, BNP 26, procal <0.10, lactic acid 1.0, WBC 11.6.   PCCM consulted in this setting  Pertinent  Medical History  Chronic respiratory failure with hypoxia on 4L - sees [facility] Breast cancer Constipation COPD exacerbation Depression with anxiety Essential hypertension  History of radiation therapy Hyperlipidemia  Hypertension   Tobacco dependence due to cigarettes   Significant Hospital Events: Including procedures, antibiotic start and stop dates in addition to other pertinent events   8/22 right thoracentesis  9/12 admit to stepdown with hypoxic respiratory failure, PCCM consult 9/13 No acute issues overnight, on 4L [place] this am    Interim History / Subjective:  O2 at 6L now. She feels at her baseline. At home she wears 4-5L. 5800 UOP overnight with net -9.6L since admit.  Objective   Blood pressure (!) 149/64, pulse 71, temperature 98.4 F (36.9 C), temperature source Oral, resp. rate (!) 23, SpO2 97 %.    FiO2 (%):  [60 %] 60 %   Intake/Output Summary (Last 24 hours) at 02/09/2022 0728 Last data filed at 02/09/2022 0631 Gross per 24 hour  Intake --  Output 5800 ml  Net -5800 ml    There were no vitals filed for this visit.  Physical Exam  General: Chronically ill-appearing woman, resting in bed, no distress HEENT: [place]/AT, MM pink/moist, on 6 liters nasal cannula Neuro: Alert and oriented x3, non-focal,  CV: RRR, no M/R/G PULM: Diminished bilaterally GI: soft, bowel sounds active in all 4 quadrants, non-tender, non-distended, Extremities: warm/dry, no edema  Skin: Radiation changes over right breast and back   CTA chest 9/12 reviewed , decreased bilateral effusions, mild right basilar atelectasis, increased septal thickening specially right upper lobe  Resolved Hospital Problem list     Assessment & Plan:  Cause for persistent hypoxia is unclear, she has increased septal thickening with suggest radiation pneumonitis in the right upper lobe-she certainly has skin changes of radiation on the right.  Last radiation was May 2023 so time course would be consistent.  She does not have definite ILD.  She does not have active bronchospasm.  Pleural effusions were noted to be transudative , last visit 12/2021 she felt much improved with diuresis.  Echo shows normal LV function, BNP was low   Acute on Chronic Hypoxic Respiratory Failure  COPD exacerbation  Chest pain on inspiration due to severe R breast and chest wall pain Radiation pneumonitis Tobacco Use -Sees Dr. [name], PFT's pending, on Breztri, flonase, proair at home -RVP negative  -Completed 5 days of antibiotics P: Wean supplemental oxygen to  maintain SpO2 of 88-90% Continue PRN BIPAP  Continue budesonide, Brovana and Yupelri in an effort to maximize triple therapy Continue prednisone, start to taper off over next 1 week or so, will drop to 7m today Hold further lasix for now given net -9.6L currently Tobacco cessation counseling Will ask our office to arrange for hospital follow up in the next 1-2 weeks  R breast pain History of R breast Ca -Completed radiation in May, currently on Exemestane. Breast is tender and erythematous -Right breast [place] for masses or evidence of abscess, follow up mammogram recommended  P: Ensure adequate pain control  F/u mammogram  Atrial Fibrillation,  -New onset, back in sinus rhythm P: Primary management per Cardiology/primary team  Eliquis and low dose beta blocker started 9/11 Avoid Amiodarone  Hx HTN, HLD - she is asking for her Amlodipine to be resumed. - Resume home Amlodipine but will start at 598mdaily for now then primary team can increase to normal dose of 10 if tolerates fine.  Generalized Deconditioning. - PT Eval. - Push mobilization.   Nothing further to add.  PCCM will sign off.  Please call usKoreaack if we can be of any further assistance.    [facility] For pager details, please see AMION or use Epic chat  After 1900, please call ELTracyor cross coverage needs 02/09/2022, 7:28 AM

## 2022-02-09 NOTE — Progress Notes (Signed)
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  She was also short of breath.  She was found to be hypoxic with SPO2 in the 40s on EMS arrival initially.  She was reporting occasional dry cough but no sputum nor fevers, chills. She was placed on BiPAP and brought to the hospital for further evaluation. During work-up she was also noted to be in new onset A-fib with RVR. She underwent evaluations by pulmonology and cardiology as well.  Interval History:  No events overnight.  Oxygen has been able to be weaned further for the first time over the past 24 hours.  Currently on 6 L this morning.  Assessment and Plan:  Acute on chronic respiratory failure with hypoxia and hypercapnia (HCC) COPD exacerbation [facility]) -Pulmonology consulted on admission as well; patient follows with Dr. [name] - CTA chest negative for PE.  Did show increased diffuse pulmonary septal thickening and underlying edema versus atypical infection. - She was started on antibiotics and steroids on admission.  Of note, has been tolerating azithromycin in context of history of erythromycin allergy -Antibiotic course has been completed -RVP negative as well -Still having difficulty weaning oxygen.  Diuresed well with Lasix (somewhat equivocal response) -Resumed back on extended course of steroids per pulmonology and further breathing treatments added - Continue monitoring response to above  Atrial fibrillation with rapid ventricular response (HCC) CHA2DS2-VASc Score of at least 5 - started on cardizem gtt on admission - Eliquis also  initiated as well  - echo obtained on admission - cardiology following  - cardizem drip being transitioned to Toprol per cardiology  - amio to be avoided with underlying lung disease  Subclinical hyperthyroidism -TSH mildly suppressed, 0.243. -Free T4 normal range, 0.71 -Total T3 is 93 (normal range) -No further work-up at this time.  Repeat thyroid studies in 2 to 3 months   Essential hypertension - continue toprol    Breast pain Continue oxycodone as needed.  Depression with anxiety -Database reviewed.  Sporadic fills of alprazolam 0.5 mg; last filled 01/08/2022 -resuming nightly alprazolam as per home use    GERD (gastroesophageal reflux disease) Continue pantoprazole 40 mg p.o. daily.   Hyperlipidemia Currently not taking atorvastatin.  Reminded patient her CAD is being treated with medical management.  She was willing to consider restarting Lipitor   Old records reviewed in assessment of this patient  Antimicrobials: Azithro 9/12 >> 02/06/2022 Rocephin 9/12 >> 02/07/2022  DVT prophylaxis:   apixaban (ELIQUIS) tablet 5 mg   Code Status:   Code Status: Full Code  Mobility Assessment (last 72 hours)     Mobility Assessment     Row Name 02/08/22 0800           Does patient have an order for bedrest or is patient medically unstable No - Continue assessment       What is the highest level of mobility based on the progressive mobility assessment? Level 3 (Stands with assist) - Balance while standing  and cannot march in place                Barriers to discharge:  none Disposition Plan:  Home possibly on Wednesday  Status is: Inpt  Objective: Blood pressure (!) 131/92, pulse 80, temperature 99 F (37.2 C), temperature source Oral, resp. rate 19, SpO2 94 %.  Examination:  Physical Exam Constitutional:      Appearance: She is well-developed.  HENT:     Head: Normocephalic and atraumatic.     Mouth/Throat:     Mouth: Mucous membranes are moist.  Eyes:      Extraocular Movements: Extraocular movements intact.  Cardiovascular:     Rate and Rhythm: Normal rate and regular rhythm.  Pulmonary:     Effort: Pulmonary effort is normal.     Comments: Coarse breath sounds Abdominal:     General: Bowel sounds are normal. There is no distension.     Palpations: Abdomen is soft.     Tenderness: There is no abdominal tenderness.  Musculoskeletal:        General: Normal range of motion.     Cervical back: Normal range of motion and neck supple.  Skin:    General: Skin is warm and dry.  Neurological:     General: No focal deficit present.     Mental Status: She is alert.  Psychiatric:        Mood and Affect: Mood normal.      Consultants:  Cardiology Pulmonology   Procedures:    Data Reviewed: Results for orders placed or performed during the hospital encounter of 02/02/22 (from the past 24 hour(s))  Basic metabolic panel     Status: None   Collection Time: 02/09/22  9:55 AM  Result Value Ref Range   Sodium 139 135 - 145 mmol/L   Potassium 3.6 3.5 - 5.1 mmol/L   Chloride 100 98 - 111 mmol/L   CO2 28 22 - 32 mmol/L   Glucose, Bld 94 70 - 99 mg/dL   BUN 20 8 - 23 mg/dL   Creatinine, Ser 0.44 0.44 - 1.00 mg/dL   Calcium 9.2 8.9 - 10.3 mg/dL   GFR, Estimated >60 >60 mL/min   Anion gap 11 5 - 15  Blood gas, venous     Status: Abnormal   Collection Time: 02/09/22  9:55 AM  Result Value Ref Range   pH, Ven 7.34 7.25 - 7.43   pCO2, Ven 77 (HH) 44 - 60 mmHg   pO2, Ven 36 32 - 45 mmHg   Bicarbonate 41.0 (H) 20.0 - 28.0 mmol/L   Acid-Base Excess 11.8 (H) 0.0 - 2.0 mmol/L   O2 Saturation 46.2 %   Patient temperature 37.3     I have Reviewed nursing notes, Vitals, and Lab results since pt's last encounter. Pertinent lab results : see above I have ordered test including BMP, CBC, Mg I have reviewed the last note from staff over past 24 hours I have discussed pt's care plan and test results with nursing staff, case manager   LOS: 7  days   Dwyane Dee, MD [facility] 02/09/2022, 1:32 PM

## 2022-02-10 DIAGNOSIS — I4891 Unspecified atrial fibrillation: Secondary | ICD-10-CM | POA: Diagnosis not present

## 2022-02-10 DIAGNOSIS — K219 Gastro-esophageal reflux disease without esophagitis: Secondary | ICD-10-CM

## 2022-02-10 DIAGNOSIS — J441 Chronic obstructive pulmonary disease with (acute) exacerbation: Secondary | ICD-10-CM | POA: Diagnosis not present

## 2022-02-10 DIAGNOSIS — J9621 Acute and chronic respiratory failure with hypoxia: Secondary | ICD-10-CM | POA: Diagnosis not present

## 2022-02-10 DIAGNOSIS — R0789 Other chest pain: Secondary | ICD-10-CM | POA: Diagnosis not present

## 2022-02-10 MED ORDER — APIXABAN 5 MG PO TABS: 5.0000 mg | ORAL_TABLET | Freq: Two times a day (BID) | ORAL | 1 refills | Status: DC

## 2022-02-10 MED ORDER — AMLODIPINE BESYLATE 5 MG PO TABS: 5.0000 mg | ORAL_TABLET | Freq: Every day | ORAL | 1 refills | Status: DC

## 2022-02-10 MED ORDER — METOPROLOL SUCCINATE ER 25 MG PO TB24: 25.0000 mg | ORAL_TABLET | Freq: Every day | ORAL | 1 refills | Status: DC

## 2022-02-10 MED ORDER — IPRATROPIUM-ALBUTEROL 0.5-2.5 (3) MG/3ML IN SOLN: 3.0000 mL | Freq: Two times a day (BID) | RESPIRATORY_TRACT | 1 refills | Status: DC | PRN

## 2022-02-10 MED ORDER — PREDNISONE 10 MG PO TABS: ORAL_TABLET | ORAL | 0 refills | Status: DC

## 2022-02-10 MED ORDER — FUROSEMIDE 40 MG PO TABS: 40.0000 mg | ORAL_TABLET | Freq: Every day | ORAL | 0 refills | Status: DC | PRN

## 2022-02-10 NOTE — Progress Notes (Signed)
Patient was discharged via wheelchair and portable oxygen by myself. All patient belongings and valuables collected. VSS. Pts daughter waiting at front entrance, patient was safetly transferred from wheelchair to car.

## 2022-02-10 NOTE — Progress Notes (Signed)
Bipap on standby 

## 2022-02-10 NOTE — Progress Notes (Signed)
SATURATION QUALIFICATIONS: (This note is used to comply with regulatory documentation for home oxygen)  Patient Saturations on Room Air at Rest = 85%  Patient Saturations on Room Air while Ambulating = 70%  Patient Saturations on 6 Liters of oxygen while Ambulating = 97%  Please briefly explain why patient needs home oxygen: Patient oxygenation requirements are continuous even with short distance transfers to the bathroom. Her sp02 reading drops below 80% if oxygen is removed only for repositioning purposes. She is requesting portable oxygen so that she is able to ambulate outside of her home, in her yard/driveway for exercise and fresh air. Currently her oxygen only reaches to her back porch. She has been confined strictly to her house because of this

## 2022-02-10 NOTE — TOC Transition Note (Signed)
Transition of Care [facility]) - CM/SW Discharge Note   Patient Details  Name: Caroline Greer MRN: [id number] Date of Birth: 19-Jan-1956  [facility] CM/SW Contact:  Vassie Moselle, LCSW Phone Number: 02/10/2022, 1:52 PM   Clinical Narrative:    [employer] delivered portable O2 tank to pt's room and will also be delivering 10L concentrator to her home. Home health PT/RN services have been arranged through [facility].    Final next level of care: [place] Barriers to Discharge: Barriers Resolved   Patient Goals and CMS Choice Patient states their goals for this hospitalization and ongoing recovery are:: To return home   Choice offered to / list presented to : Patient  Discharge Placement                       Discharge Plan and Services                DME Arranged: Oxygen DME Agency: [employer] Date DME Agency Contacted: 02/10/22 Time DME Agency Contacted: [id number] Representative spoke with at DME Agency: [place]: RN, PT [place] Agency: [place] Date [place]: 02/10/22 Time [place]: [id number] Representative spoke with at [place]: [place] ([place]) Interventions     Readmission Risk Interventions    02/10/2022    1:28 PM 01/13/2022    1:08 PM  Readmission Risk Prevention Plan  Transportation Screening Complete Complete  PCP or Specialist Appt within 5-7 Days  Complete  PCP or Specialist Appt within 3-5 Days Complete   Home Care Screening  Complete  Medication Review (RN CM)  Complete  HRI or Home Care Consult Complete   Social Work Consult for [place] Planning/Counseling Complete   Palliative Care Screening Not Applicable   Medication Review Press photographer) Complete

## 2022-02-10 NOTE — Discharge Summary (Signed)
Physician Discharge Summary   Patient: Caroline Greer MRN: [id number] DOB: 05/08/1956  Admit date:     02/02/2022  Discharge date: 02/10/2022  Discharge Physician: Hosie Poisson   PCP: [name], MD   Recommendations at discharge:  Please follow up with Dr [name] as scheduled.  Please follow up with PCp in one week.  Please follow up with cbc and bmp in one week.  Please follow up with cardiology/ Atrial fib clinic in one week.    Discharge Diagnoses: Principal Problem:   Acute on chronic respiratory failure with hypoxia and hypercapnia (HCC) Active Problems:   COPD exacerbation (HCC)   Essential hypertension   Depression with anxiety   Atrial fibrillation with rapid ventricular response (HCC)   GERD (gastroesophageal reflux disease)   Hyperlipidemia   Breast pain   Chest wall pain   Subclinical hyperthyroidism   Hospital Course: Caroline Greer is a 66 yo female with PMH COPD, chronic hypoxic respiratory failure, right breast cancer s/p lumpectomy and radiation, depression/anxiety, HTN, HLD, tobacco use, and nonobstructive CAD managed medically (last cath 09/2019) who presented with right-sided chest pain.  She was also short of breath.  She was found to be hypoxic with SPO2 in the 40s on EMS arrival initially.  She was reporting occasional dry cough but no sputum nor fevers, chills. She was placed on BiPAP and brought to the hospital for further evaluation. During work-up she was also noted to be in new onset A-fib with RVR. She underwent evaluations by pulmonology and cardiology as well.  Assessment and Plan:  Acute on chronic respiratory failure with hypoxia and hypercapnia (HCC) COPD exacerbation [facility]) -Pulmonology consulted on admission as well; patient follows with Dr. [name] - CTA chest negative for PE.  Did show increased diffuse pulmonary septal thickening and underlying edema versus atypical infection. - She was started on antibiotics and steroids on admission.  Of  note, has been tolerating azithromycin in context of history of erythromycin allergy -Antibiotic course has been completed -RVP negative as well -Still having difficulty weaning oxygen, pt reports she is on 4 lit of [place] oxygen at home, as per RN patient walked and she required about 5 lit of [place] oxygen with ambulation and her oxygen sats remain at 97%.  -she was discharged on tapering dose of prednisone and recommended to follow up with Dr [name] as scheduled .  -Resumed back on extended course of steroids per pulmonology and further breathing treatments added    Atrial fibrillation with rapid ventricular response (HCC) CHA2DS2-VASc Score of at least 5 - Eliquis also initiated as well  - echo obtained on admission - cardiology recommendations given.  - cardizem drip being transitioned to Toprol per cardiology  - amio to be avoided with underlying lung disease   Subclinical hyperthyroidism -TSH mildly suppressed, 0.243. -Free T4 normal range, 0.71 -Total T3 is 93 (normal range) -No further work-up at this time.  Repeat thyroid studies in 2 to 3 months   Essential hypertension - continue toprol    Breast pain Continue oxycodone as needed.   Depression with anxiety -Database reviewed.  Sporadic fills of alprazolam 0.5 mg; last filled 01/08/2022 -resuming nightly alprazolam as per home use    GERD (gastroesophageal reflux disease) Continue pantoprazole 40 mg p.o. daily.   Hyperlipidemia Currently not taking atorvastatin.  Reminded patient her CAD is being treated with medical management.        Consultants: PCCM.  Procedures performed: none.   Disposition: Home Diet recommendation:  Discharge Diet  Orders (From admission, onward)     Start     Ordered   02/10/22 0000  Diet - low sodium heart healthy        02/10/22 1237           Regular diet DISCHARGE MEDICATION: Allergies as of 02/10/2022       Reactions   Crab [shellfish Allergy] Anaphylaxis, Swelling    Dungeness crab   Erythromycin Other (See Comments)   Convulsion, Tolerated Azithro 01/2022   Hydrocodone Nausea And Vomiting   Severe vomiting         Medication List     STOP taking these medications    atorvastatin 10 MG tablet Commonly known as: LIPITOR   loratadine 10 MG tablet Commonly known as: CLARITIN   magnesium hydroxide 400 MG/5ML suspension Commonly known as: MILK OF MAGNESIA   metoprolol tartrate 25 MG tablet Commonly known as: LOPRESSOR   nystatin 100000 UNIT/ML suspension Commonly known as: MYCOSTATIN   OXYGEN   polyethylene glycol 17 g packet Commonly known as: MIRALAX / GLYCOLAX       TAKE these medications    acetaminophen 500 MG tablet Commonly known as: TYLENOL Take 1,000 mg by mouth daily as needed (pain).   ALPRAZolam 0.5 MG tablet Commonly known as: XANAX Take 0.5 mg by mouth at bedtime as needed for sleep.   amLODipine 5 MG tablet Commonly known as: NORVASC Take 1 tablet (5 mg total) by mouth daily. Start taking on: February 11, 2022 What changed:  medication strength how much to take   apixaban 5 MG Tabs tablet Commonly known as: ELIQUIS Take 1 tablet (5 mg total) by mouth 2 (two) times daily.   aspirin EC 81 MG tablet Take 81 mg by mouth daily.   Breztri Aerosphere 160-9-4.8 MCG/ACT Aero Generic drug: Budeson-Glycopyrrol-Formoterol Inhale 2 puffs into the lungs in the morning and at bedtime.   cyanocobalamin 500 MCG tablet Commonly known as: VITAMIN B12 Take 500 mcg by mouth daily.   exemestane 25 MG tablet Commonly known as: AROMASIN Take 0.5 tablets (12.5 mg total) by mouth daily after breakfast.   fluticasone 50 MCG/ACT nasal spray Commonly known as: FLONASE Place 2 sprays into both nostrils 2 (two) times daily.   furosemide 40 MG tablet Commonly known as: LASIX Take 1 tablet (40 mg total) by mouth daily as needed. Take 1 tablet daily x 2 days, then as needed.   guaiFENesin 600 MG 12 hr tablet Commonly  known as: MUCINEX Take 600 mg by mouth 2 (two) times daily as needed for to loosen phlegm.   ipratropium-albuterol 0.5-2.5 (3) MG/3ML Soln Commonly known as: DUONEB Use 1 vial via nebulizer 2 times daily for 3 days then 2 times daily as need.(shortness of breath)   metoprolol succinate 25 MG 24 hr tablet Commonly known as: TOPROL-XL Take 1 tablet (25 mg total) by mouth daily. Start taking on: February 11, 2022   pantoprazole 40 MG tablet Commonly known as: PROTONIX Take 1 tablet (40 mg total) by mouth daily at 6 (six) AM.   predniSONE 10 MG tablet Commonly known as: DELTASONE Prednisone 30 mg daily for 3 days followed by  Prednisone 20 mg daily for 3 days followed by  Prednisone 10 mg daily for 3 days. Start taking on: February 11, 2022   ProAir HFA 108 (90 Base) MCG/ACT inhaler Generic drug: albuterol Inhale 2 puffs into the lungs every 6 (six) hours as needed for wheezing or shortness of breath. 90 day supply  Vitamin D3 25 MCG (1000 UT) Caps Take 1,000 Units by mouth daily.   Vitamin E Skin Oil Apply 1 Application topically daily.               Durable Medical Equipment  (From admission, onward)           Start     Ordered   02/10/22 0000  For home use only DME oxygen       Comments: Patient requesting portable oxygen too.  Question Answer Comment  Length of Need Lifetime   Mode or (Route) Nasal cannula   Liters per Minute 6   Frequency Continuous (stationary and portable oxygen unit needed)   Oxygen conserving device Yes   Oxygen delivery system Gas      02/10/22 1237            Discharge Exam: There were no vitals filed for this visit. General exam: Appears calm and comfortable on 5 lit of [place] oxygen.  Respiratory system: Clear to auscultation. Respiratory effort normal. Cardiovascular system: S1 & S2 heard, RRR. No JVD, No pedal edema. Gastrointestinal system: Abdomen is nondistended, soft and nontender. Normal bowel sounds  heard. Central nervous system: Alert and oriented. No focal neurological deficits. Extremities: Symmetric 5 x 5 power. Skin: No rashes, lesions or ulcers Psychiatry:  Mood & affect appropriate.    Condition at discharge: fair  The results of significant diagnostics from this hospitalization (including imaging, microbiology, ancillary and laboratory) are listed below for reference.   Imaging Studies: DG CHEST PORT 1 VIEW  Result Date: 02/06/2022 CLINICAL DATA:  Acute on chronic respiratory failure. Follow-up study. EXAM: PORTABLE CHEST 1 VIEW COMPARISON:  02/02/2022. FINDINGS: Cardiac silhouette is normal in size. No mediastinal or hilar masses. Hazy opacity at both lung bases consistent with small effusions and atelectasis is without change. Remainder of the lungs is clear. Interstitial prominence has mildly decreased from the prior exam. No pneumothorax. Stable changes from right breast surgery. Skeletal structures are grossly intact. IMPRESSION: 1. Mild improvement since the prior study. Interstitial markings are less prominent consistent with improved interstitial edema. 2. No other change. Small persistent pleural effusions with associated lung base atelectasis. No evidence of pneumonia. Electronically Signed   By: Lajean Manes M.D.   On: 02/06/2022 10:22   ECHOCARDIOGRAM LIMITED  Result Date: 02/02/2022    ECHOCARDIOGRAM LIMITED REPORT   Patient Name:   Caroline Greer Date of Exam: 02/02/2022 Medical Rec #:  [id number]     Height:       70.0 in Accession #:    [id number]    Weight:       140.3 lb Date of Birth:  [birth year]     BSA:          1.795 m Patient Age:    74 years      BP:           110/66 mmHg Patient Gender: F             HR:           101 bpm. Exam Location:  Inpatient Procedure: 2D Echo, Limited Echo and Limited Color Doppler Indications:     AFIB  History:         Patient has prior history of Echocardiogram examinations, most                  recent 12/15/2021. COPD,  Signs/Symptoms:Dyspnea; Risk  Factors:Hypertension and Dyslipidemia.  Sonographer:     Memory Argue Referring Phys:  [id number] Margie Billet Diagnosing Phys: Eleonore Chiquito MD IMPRESSIONS  1. Left ventricular ejection fraction, by estimation, is 60 to 65%. The left ventricle has normal function. The left ventricle has no regional wall motion abnormalities. Left ventricular diastolic function could not be evaluated.  2. Right ventricular systolic function is normal. The right ventricular size is mildly enlarged.  3. The mitral valve is grossly normal. Trivial mitral valve regurgitation. No evidence of mitral stenosis.  4. The aortic valve was not well visualized. Aortic valve regurgitation is not visualized. No aortic stenosis is present. Comparison(s): Changes from prior study are noted. Afib is now present. EF unchanged. FINDINGS  Left Ventricle: Left ventricular ejection fraction, by estimation, is 60 to 65%. The left ventricle has normal function. The left ventricle has no regional wall motion abnormalities. The left ventricular internal cavity size was normal in size. There is  no left ventricular hypertrophy. Left ventricular diastolic function could not be evaluated. Left ventricular diastolic function could not be evaluated due to atrial fibrillation. Right Ventricle: The right ventricular size is mildly enlarged. No increase in right ventricular wall thickness. Right ventricular systolic function is normal. Mitral Valve: The mitral valve is grossly normal. Trivial mitral valve regurgitation. No evidence of mitral valve stenosis. Tricuspid Valve: The tricuspid valve is grossly normal. Tricuspid valve regurgitation is trivial. No evidence of tricuspid stenosis. Aortic Valve: The aortic valve was not well visualized. Aortic valve regurgitation is not visualized. No aortic stenosis is present. Aortic valve mean gradient measures 4.0 mmHg. Aortic valve peak gradient measures 6.6 mmHg. LEFT  VENTRICLE PLAX 2D LVIDd:         4.00 cm LVIDs:         2.60 cm LV PW:         1.00 cm LV IVS:        1.20 cm  RIGHT VENTRICLE TAPSE (M-mode): 2.2 cm LEFT ATRIUM         Index LA diam:    4.00 cm 2.23 cm/m  AORTIC VALVE AV Vmax:      128.00 cm/s AV Vmean:     86.900 cm/s AV VTI:       0.185 m AV Peak Grad: 6.6 mmHg AV Mean Grad: 4.0 mmHg Eleonore Chiquito MD Electronically signed by Eleonore Chiquito MD Signature Date/Time: 02/02/2022/4:20:06 PM    Final (Updated)    US BREAST LTD UNI RIGHT INC AXILLA  Result Date: 02/02/2022 CLINICAL DATA:  Pain and redness of the right breast upper outer quadrant, status post right lumpectomy in March 2023. EXAM: ULTRASOUND OF THE RIGHT BREAST COMPARISON:  Previous exam(s). FINDINGS: Targeted ultrasound is performed, showing no suspicious masses, shadowing lesions or fluid collections. IMPRESSION: No suspicious masses or evidence of abscess in the right breast 9-12 o'clock. RECOMMENDATION: Recommend further evaluation with diagnostic mammogram given the patient's presenting symptoms and history. I have discussed the findings and recommendations with the patient. If applicable, a reminder letter will be sent to the patient regarding the next appointment. BI-RADS CATEGORY  2: Benign. Electronically Signed   By: Fidela Salisbury M.D.   On: 02/02/2022 15:00   CT Angio Chest Pulmonary Embolism (PE) W or WO Contrast  Result Date: 02/02/2022 CLINICAL DATA:  66 year old female with right side chest pain radiating to the back, shortness of breath. Hypoxic at presentation. EXAM: CT ANGIOGRAPHY CHEST WITH CONTRAST TECHNIQUE: Multidetector CT imaging of the chest was performed using the  standard protocol during bolus administration of intravenous contrast. Multiplanar CT image reconstructions and MIPs were obtained to evaluate the vascular anatomy. RADIATION DOSE REDUCTION: This exam was performed according to the departmental dose-optimization program which includes automated exposure  control, adjustment of the mA and/or kV according to patient size and/or use of iterative reconstruction technique. CONTRAST:  24m OMNIPAQUE IOHEXOL 350 MG/ML SOLN COMPARISON:  Portable chest 0139 hours today.  CTA chest 01/10/2022. FINDINGS: Cardiovascular: Good contrast bolus timing in the pulmonary arterial tree. Mild respiratory motion. No pulmonary artery filling defect identified. Some central pulmonary artery enlargement (series 5, image 117). Calcified coronary artery atherosclerosis and/or stents. Calcified aortic atherosclerosis. No cardiomegaly or pericardial effusion. Mediastinum/Nodes: Reactive appearing mediastinal and hilar lymph nodes appear stable from last month. No mediastinal mass. Right axillary and superior breast surgical clips. Lungs/Pleura: Regressed but not completely resolved bilateral pleural effusions since last month. Enhancing lung base atelectasis greater on the left. Similar lung volumes with increased diffuse pulmonary septal thickening since last month (series 6, image 36). Major airways remain patent. No consolidation. Regressed lateral segment right middle lobe atelectasis, but increased lingula atelectasis since last month. Upper Abdomen: Negative visible liver, spleen, adrenal glands, kidneys and stomach in the upper abdomen. No upper abdominal free air or free fluid. Musculoskeletal: Stable.  No acute osseous abnormality identified. Review of the MIP images confirms the above findings. IMPRESSION: 1. Negative for acute pulmonary embolus. 2. Increased diffuse pulmonary septal thickening since last month. Regressed but not completely resolved bilateral pleural effusions. Stable reactive appearing mediastinal lymph nodes. Consider acute pulmonary edema versus viral/atypical respiratory infection. Chronic interstitial lung disease felt less likely. 3. Calcified coronary artery and Aortic Atherosclerosis (ICD10-I70.0). Electronically Signed   By: HGenevie AnnM.D.   On: 02/02/2022  06:05   DG Chest Port 1 View  Result Date: 02/02/2022 CLINICAL DATA:  Sepsis EXAM: PORTABLE CHEST 1 VIEW COMPARISON:  01/13/2022 FINDINGS: The heart size and mediastinal contours are within normal limits. Both lungs are clear. The visualized skeletal structures are unremarkable. Lungs are hyperinflated. IMPRESSION: No active disease. Electronically Signed   By: KUlyses JarredM.D.   On: 02/02/2022 01:49   DG CHEST PORT 1 VIEW  Result Date: 01/13/2022 CLINICAL DATA:  [id number] status post right thoracentesis EXAM: PORTABLE CHEST 1 VIEW COMPARISON:  January 12, 2022 FINDINGS: The heart size and mediastinal contours are within normal limits. Lungs are hyperinflated and remain clear. No right-sided pneumothorax. The visualized skeletal structures are unremarkable except for the stable surgical clips at the right axilla. IMPRESSION: Lungs are hyperinflated and remain clear.  No new finding. Electronically Signed   By: AFrazier RichardsM.D.   On: 01/13/2022 08:23   DG CHEST PORT 1 VIEW  Result Date: 01/12/2022 CLINICAL DATA:  Status post thoracentesis. EXAM: PORTABLE CHEST 1 VIEW COMPARISON:  Chest x-ray 01/12/2022. FINDINGS: Decreased right pleural effusion. No visible pneumothorax. No consolidation. Cardiomediastinal silhouette is unchanged in within normal limits. No acute osseous abnormality. Right breast clips. IMPRESSION: Decreased right pleural effusion.  No visible pneumothorax. Electronically Signed   By: FMargaretha SheffieldM.D.   On: 01/12/2022 14:22   DG Chest Port 1 View  Result Date: 01/12/2022 CLINICAL DATA:  [id number]  Pleural effusion follow-up. EXAM: PORTABLE CHEST 1 VIEW COMPARISON:  Chest CT with contrast August 20. FINDINGS: 4:47 a.m. Small right-greater-than-left pleural effusions are again noted, with asymmetric opacity overlying the right effusion which could be atelectasis or pneumonia. Remaining lungs are clear with COPD change. The cardiac size  is normal. The mediastinum is normally  outlined. There is aortic atherosclerosis. Old right axillary surgical clips.  There is osteopenia. IMPRESSION: The overall aeration is unchanged. Small right-greater-than-left pleural effusions and overlying opacities of the right base show no improvement or worsening. The cardiac size is normal. Electronically Signed   By: Telford Nab M.D.   On: 01/12/2022 07:36    Microbiology: Results for orders placed or performed during the hospital encounter of 02/02/22  Blood Culture (routine x 2)     Status: None   Collection Time: 02/02/22  1:31 AM   Specimen: BLOOD  Result Value Ref Range Status   Specimen Description   Final    BLOOD BLOOD LEFT FOREARM Performed at [facility], [place] [address] [place], [place] [address]    Special Requests   Final    BLOOD Blood Culture results may not be optimal due to an excessive volume of blood received in culture bottles Performed at [place] [place] [address], [place], [place] [address]    Culture   Final    NO GROWTH 5 DAYS Performed at [facility], [place] [address] [place], [place] [address]    Report Status 02/07/2022 FINAL  Final  SARS Coronavirus 2 by RT PCR (hospital order, performed in [facility] hospital lab) *cepheid single result test* Anterior Nasal Swab     Status: None   Collection Time: 02/02/22  2:59 AM   Specimen: Anterior Nasal Swab  Result Value Ref Range Status   SARS Coronavirus 2 by RT PCR NEGATIVE NEGATIVE Final    Comment: (NOTE) SARS-CoV-2 target nucleic acids are NOT DETECTED.  The SARS-CoV-2 RNA is generally detectable in upper and lower respiratory specimens during the acute phase of infection. The lowest concentration of SARS-CoV-2 viral copies this assay can detect is 250 copies / mL. A negative result does not preclude SARS-CoV-2 infection and should not be used as the sole basis for treatment or other patient management decisions.  A negative result may occur  with improper specimen collection / handling, submission of specimen other than nasopharyngeal swab, presence of viral mutation(s) within the areas targeted by this assay, and inadequate number of viral copies (<250 copies / mL). A negative result must be combined with clinical observations, patient history, and epidemiological information.  Fact Sheet for Patients:   https://www.patel.info/  Fact Sheet for Healthcare Providers: https://hall.com/  This test is not yet approved or  cleared by the [place] FDA and has been authorized for detection and/or diagnosis of SARS-CoV-2 by FDA under an Emergency Use Authorization (EUA).  This EUA will remain in effect (meaning this test can be used) for the duration of the COVID-19 declaration under Section 564(b)(1) of the Act, 21 U.S.C. section 360bbb-3(b)(1), unless the authorization is terminated or revoked sooner.  Performed at [facility], [place] [address] [place], [place] [address]   Blood Culture (routine x 2)     Status: None   Collection Time: 02/02/22  3:40 AM   Specimen: BLOOD  Result Value Ref Range Status   Specimen Description   Final    BLOOD BLOOD LEFT HAND Performed at [facility] [address], [place], [place] [address]    Special Requests   Final    BOTTLES DRAWN AEROBIC AND ANAEROBIC Blood Culture adequate volume Performed at [facility] [address], [place], [place] [address]    Culture   Final  NO GROWTH 5 DAYS Performed at [facility], [place] [address] [place], [place] [address]    Report Status 02/07/2022 FINAL  Final  MRSA Next Gen by PCR, Nasal     Status: None   Collection Time: 02/02/22  8:25 AM   Specimen: Nasal Mucosa; Nasal Swab  Result Value Ref Range Status   MRSA by PCR Next Gen NOT DETECTED NOT DETECTED Final    Comment: (NOTE) The GeneXpert MRSA Assay (FDA approved for NASAL  specimens only), is one component of a comprehensive MRSA colonization surveillance program. It is not intended to diagnose MRSA infection nor to guide or monitor treatment for MRSA infections. Test performance is not FDA approved in patients less than 46 years old. Performed at [facility], [place] [address] [place], [place] [address]   Respiratory (~20 pathogens) panel by PCR     Status: None   Collection Time: 02/02/22 12:22 PM   Specimen: Nasopharyngeal Swab; Respiratory  Result Value Ref Range Status   Adenovirus NOT DETECTED NOT DETECTED Final   Coronavirus 229E NOT DETECTED NOT DETECTED Final    Comment: (NOTE) The Coronavirus on the Respiratory Panel, DOES NOT test for the novel  Coronavirus (2019 nCoV)    Coronavirus HKU1 NOT DETECTED NOT DETECTED Final   Coronavirus NL63 NOT DETECTED NOT DETECTED Final   Coronavirus OC43 NOT DETECTED NOT DETECTED Final   Metapneumovirus NOT DETECTED NOT DETECTED Final   Rhinovirus / Enterovirus NOT DETECTED NOT DETECTED Final   Influenza A NOT DETECTED NOT DETECTED Final   Influenza B NOT DETECTED NOT DETECTED Final   Parainfluenza Virus 1 NOT DETECTED NOT DETECTED Final   Parainfluenza Virus 2 NOT DETECTED NOT DETECTED Final   Parainfluenza Virus 3 NOT DETECTED NOT DETECTED Final   Parainfluenza Virus 4 NOT DETECTED NOT DETECTED Final   Respiratory Syncytial Virus NOT DETECTED NOT DETECTED Final   Bordetella pertussis NOT DETECTED NOT DETECTED Final   Bordetella Parapertussis NOT DETECTED NOT DETECTED Final   Chlamydophila pneumoniae NOT DETECTED NOT DETECTED Final   Mycoplasma pneumoniae NOT DETECTED NOT DETECTED Final    Comment: Performed at [facility], [place]. [address], [place], [place] [address]  Urine Culture     Status: Abnormal   Collection Time: 02/02/22  2:30 PM   Specimen: In/Out Cath Urine  Result Value Ref Range Status   Specimen Description   Final    IN/OUT CATH URINE Performed at [place] [address], [place], [place] [address]    Special Requests   Final    NONE Performed at [facility], [place] [address] [place],  [address]    Culture MULTIPLE SPECIES PRESENT, SUGGEST RECOLLECTION (A)  Final   Report Status 02/03/2022 FINAL  Final    Labs: CBC: Recent Labs  Lab 02/04/22 0329 02/05/22 0728 02/06/22 0310 02/07/22 0307 02/08/22 0320  WBC 13.3* 12.2* 11.3* 10.5 10.0  NEUTROABS 11.3* 9.1* 8.9* 7.8* 7.0  HGB 11.5* 13.2 12.4 12.6 13.2  HCT 36.8 42.6 39.7 40.8 42.8  MCV 95.8 97.0 95.4 95.3 94.7  PLT 211 230 232 261 532   Basic Metabolic Panel: Recent Labs  Lab 02/04/22 0329 02/05/22 0728 02/06/22 0310 02/07/22 0307 02/08/22 0320 02/09/22 0955  NA 140 140 137 141 143 139  K 4.3 4.3 4.5 4.5 4.3 3.6  CL 100 98 95* 96* 96* 100  CO2 36* 37* 37* 39* 41* 28  GLUCOSE 113* 89 105* 99  83 94  BUN '17 16 16 20 17 20  '$ CREATININE 0.45 0.45 0.36* 0.55 0.52 0.44  CALCIUM 8.7* 8.8* 8.8* 9.0 9.1 9.2  MG 2.1 2.0 2.0 2.2 2.0  --    Liver Function Tests: No results for input(s): "AST", "ALT", "ALKPHOS", "BILITOT", "PROT", "ALBUMIN" in the last 168 hours. CBG: No results for input(s): "GLUCAP" in the last 168 hours.  Discharge time spent: 42 minutes.   Signed: Hosie Poisson, MD [facility] 02/10/2022

## 2022-02-11 ENCOUNTER — Ambulatory Visit (HOSPITAL_COMMUNITY): Payer: Medicare Other | Admitting: Nurse Practitioner

## 2022-02-16 ENCOUNTER — Ambulatory Visit (INDEPENDENT_AMBULATORY_CARE_PROVIDER_SITE_OTHER): Payer: Medicare Other | Admitting: Adult Health

## 2022-02-16 ENCOUNTER — Encounter: Payer: Self-pay | Admitting: Adult Health

## 2022-02-16 DIAGNOSIS — J441 Chronic obstructive pulmonary disease with (acute) exacerbation: Secondary | ICD-10-CM | POA: Diagnosis not present

## 2022-02-16 DIAGNOSIS — J9611 Chronic respiratory failure with hypoxia: Secondary | ICD-10-CM

## 2022-02-16 DIAGNOSIS — I4891 Unspecified atrial fibrillation: Secondary | ICD-10-CM | POA: Diagnosis not present

## 2022-02-16 DIAGNOSIS — Z9981 Dependence on supplemental oxygen: Secondary | ICD-10-CM | POA: Diagnosis not present

## 2022-02-16 NOTE — Progress Notes (Signed)
$'@Patient'F$  ID: Caroline Greer, female    DOB: 12/11/1955, 66 y.o.   MRN: [id number]  Chief Complaint  Patient presents with   Follow-up    Follow up.     Referring provider: [name], MD  HPI: 66 year old female followed for COPD and chronic respiratory failure History significant for right breast cancer status postlumpectomy and radiation   TEST/EVENTS :   02/16/2022 Follow up ; COPD , O2 RF , Post hospital follow up  Patient presents for a posthospital follow-up.  Patient was admitted last week with an acute COPD exacerbation with acute on chronic hypercarbic and hypoxic respiratory failure.  CT chest was negative for PE.  Did show some mild diffuse septal thickening possibly secondary to edema versus atypical infection.  She was started on empiric antibiotics and steroids with clinical improvement.  Discharged on a prednisone taper.  Viral panel was negative.  Hospital stay was complicated by A-fib with RVR.  She was seen by cardiology and started on Eliquis. Since discharge patient says she is feeling better.  She remains on oxygen at 4 L.  She denies any increased oxygen demands.  Says her cough and congestion have improved.  She remains on Breztri inhaler twice daily.  Endorses compliance.  Uses her albuterol nebulizer usually once or twice daily.  She declines flu shot today.  .     Allergies  Allergen Reactions   Crab [Shellfish Allergy] Anaphylaxis and Swelling    Dungeness crab   Erythromycin Other (See Comments)    Convulsion, Tolerated Azithro 01/2022   Hydrocodone Nausea And Vomiting    Severe vomiting      There is no immunization history on file for this patient.  Past Medical History:  Diagnosis Date   Acute respiratory failure with hypoxia ([place]) 09/30/2018   Breast cancer ([place])    Constipation    COPD exacerbation ([place]) 01/05/2019   Depression with anxiety 09/30/2018   Dyspnea    Essential hypertension 09/30/2018   History of radiation therapy     Right breast- 09/01/21-10/16/21- Dr. Gery Pray   Hyperlipidemia 02/02/2022   Hypertension    Tobacco dependence due to cigarettes 09/30/2018    Tobacco History: Social History   Tobacco Use  Smoking Status Former   Packs/day: 0.80   Years: 43.00   Total pack years: 34.40   Types: Cigarettes  Smokeless Tobacco Never  Tobacco Comments   Quit smoking 2 - 3 months a go 11/30/21   Counseling given: Not Answered Tobacco comments: Quit smoking 2 - 3 months a go 11/30/21   Outpatient Medications Prior to Visit  Medication Sig Dispense Refill   acetaminophen (TYLENOL) 500 MG tablet Take 1,000 mg by mouth daily as needed (pain).     ALPRAZolam (XANAX) 0.5 MG tablet Take 0.5 mg by mouth at bedtime as needed for sleep.     amLODipine (NORVASC) 5 MG tablet Take 1 tablet (5 mg total) by mouth daily. 30 tablet 1   apixaban (ELIQUIS) 5 MG TABS tablet Take 1 tablet (5 mg total) by mouth 2 (two) times daily. 60 tablet 1   aspirin EC 81 MG tablet Take 81 mg by mouth daily.     Budeson-Glycopyrrol-Formoterol (BREZTRI AEROSPHERE) 160-9-4.8 MCG/ACT AERO Inhale 2 puffs into the lungs in the morning and at bedtime. 32.1 g 1   Cholecalciferol (VITAMIN D3) 25 MCG (1000 UT) CAPS Take 1,000 Units by mouth daily.     exemestane (AROMASIN) 25 MG tablet Take 0.5 tablets (12.5 mg total)  by mouth daily after breakfast. 60 tablet 3   fluticasone (FLONASE) 50 MCG/ACT nasal spray Place 2 sprays into both nostrils 2 (two) times daily.     furosemide (LASIX) 40 MG tablet Take 1 tablet (40 mg total) by mouth daily as needed. Take 1 tablet daily x 2 days, then as needed. 20 tablet 0   guaiFENesin (MUCINEX) 600 MG 12 hr tablet Take 600 mg by mouth 2 (two) times daily as needed for to loosen phlegm.     ipratropium-albuterol (DUONEB) 0.5-2.5 (3) MG/3ML SOLN Use 1 vial via nebulizer 2 times daily for 3 days then 2 times daily as need.(shortness of breath) 360 mL 1   metoprolol succinate (TOPROL-XL) 25 MG 24 hr tablet Take  1 tablet (25 mg total) by mouth daily. 30 tablet 1   pantoprazole (PROTONIX) 40 MG tablet Take 1 tablet (40 mg total) by mouth daily at 6 (six) AM. 30 tablet 1   predniSONE (DELTASONE) 10 MG tablet Prednisone 30 mg daily for 3 days followed by  Prednisone 20 mg daily for 3 days followed by  Prednisone 10 mg daily for 3 days. 18 tablet 0   PROAIR HFA 108 (90 Base) MCG/ACT inhaler Inhale 2 puffs into the lungs every 6 (six) hours as needed for wheezing or shortness of breath. 90 day supply 24 g 3   vitamin B-12 (CYANOCOBALAMIN) 500 MCG tablet Take 500 mcg by mouth daily.     Vitamin E Skin OIL Apply 1 Application topically daily.     No facility-administered medications prior to visit.     Review of Systems:   Constitutional:   No  weight loss, night sweats,  Fevers, chills, + fatigue, or  lassitude.  HEENT:   No headaches,  Difficulty swallowing,  Tooth/dental problems, or  Sore throat,                No sneezing, itching, ear ache, nasal congestion, post nasal drip,   CV:  No chest pain,  Orthopnea, PND, swelling in lower extremities, anasarca, dizziness, palpitations, syncope.   GI  No heartburn, indigestion, abdominal pain, nausea, vomiting, diarrhea, change in bowel habits, loss of appetite, bloody stools.   Resp:  No chest wall deformity  Skin: no rash or lesions.  GU: no dysuria, change in color of urine, no urgency or frequency.  No flank pain, no hematuria   MS:  No joint pain or swelling.  No decreased range of motion.  No back pain.    Physical Exam  BP 130/72 (BP Location: Left Arm, Patient Position: Sitting, Cuff Size: Normal)   Pulse 89   Temp 98.6 F (37 C) (Oral)   Ht '5\' 10"'$  (1.778 m)   Wt 144 lb (65.3 kg)   SpO2 96%   BMI 20.66 kg/m   GEN: A/Ox3; pleasant , NAD, chronically ill-appearing on oxygen   HEENT:  [place]/AT,   NOSE-clear, THROAT-clear, no lesions, no postnasal drip or exudate noted.   NECK:  Supple w/ fair ROM; no JVD; normal carotid impulses  w/o bruits; no thyromegaly or nodules palpated; no lymphadenopathy.    RESP  Clear  P & A; w/o, wheezes/ rales/ or rhonchi. no accessory muscle use, no dullness to percussion  CARD:  RRR, no m/r/g, tr peripheral edema, pulses intact, no cyanosis or clubbing.  GI:   Soft & nt; nml bowel sounds; no organomegaly or masses detected.   Musco: Warm bil, no deformities or joint swelling noted.   Neuro: alert, no focal  deficits noted.    Skin: Warm, no lesions or rashes    Lab Results:  CBC    Component Value Date/Time   WBC 10.0 02/08/2022 0320   RBC 4.52 02/08/2022 0320   HGB 13.2 02/08/2022 0320   HGB 15.6 (H) 06/03/2021 1234   HGB 17.3 (H) 09/26/2019 1650   HCT 42.8 02/08/2022 0320   HCT 52.2 (H) 09/26/2019 1650   PLT 281 02/08/2022 0320   PLT 247 06/03/2021 1234   PLT 236 09/26/2019 1650   MCV 94.7 02/08/2022 0320   MCV 88 09/26/2019 1650   MCH 29.2 02/08/2022 0320   MCHC 30.8 02/08/2022 0320   RDW 16.7 (H) 02/08/2022 0320   RDW 19.4 (H) 09/26/2019 1650   LYMPHSABS 1.9 02/08/2022 0320   MONOABS 0.9 02/08/2022 0320   EOSABS 0.2 02/08/2022 0320   BASOSABS 0.0 02/08/2022 0320    BMET    Component Value Date/Time   NA 139 02/09/2022 0955   NA 139 05/13/2020 1436   K 3.6 02/09/2022 0955   CL 100 02/09/2022 0955   CO2 28 02/09/2022 0955   GLUCOSE 94 02/09/2022 0955   BUN 20 02/09/2022 0955   BUN 6 (L) 05/13/2020 1436   CREATININE 0.44 02/09/2022 0955   CREATININE 0.57 06/03/2021 1234   CALCIUM 9.2 02/09/2022 0955   GFRNONAA >60 02/09/2022 0955   GFRNONAA >60 06/03/2021 1234   GFRAA 95 05/13/2020 1436    BNP    Component Value Date/Time   BNP 26.7 02/02/2022 0150    ProBNP No results found for: "PROBNP"  Imaging: DG CHEST PORT 1 VIEW  Result Date: 02/06/2022 CLINICAL DATA:  Acute on chronic respiratory failure. Follow-up study. EXAM: PORTABLE CHEST 1 VIEW COMPARISON:  02/02/2022. FINDINGS: Cardiac silhouette is normal in size. No mediastinal or hilar  masses. Hazy opacity at both lung bases consistent with small effusions and atelectasis is without change. Remainder of the lungs is clear. Interstitial prominence has mildly decreased from the prior exam. No pneumothorax. Stable changes from right breast surgery. Skeletal structures are grossly intact. IMPRESSION: 1. Mild improvement since the prior study. Interstitial markings are less prominent consistent with improved interstitial edema. 2. No other change. Small persistent pleural effusions with associated lung base atelectasis. No evidence of pneumonia. Electronically Signed   By: Lajean Manes M.D.   On: 02/06/2022 10:22   ECHOCARDIOGRAM LIMITED  Result Date: 02/02/2022    ECHOCARDIOGRAM LIMITED REPORT   Patient Name:   SHENEA GIACOBBE Date of Exam: 02/02/2022 Medical Rec #:  [id number]     Height:       70.0 in Accession #:    [id number]    Weight:       140.3 lb Date of Birth:  [birth year]     BSA:          1.795 m Patient Age:    52 years      BP:           110/66 mmHg Patient Gender: F             HR:           101 bpm. Exam Location:  Inpatient Procedure: 2D Echo, Limited Echo and Limited Color Doppler Indications:     AFIB  History:         Patient has prior history of Echocardiogram examinations, most                  recent 12/15/2021. COPD, Signs/Symptoms:Dyspnea; Risk  Factors:Hypertension and Dyslipidemia.  Sonographer:     Memory Argue Referring Phys:  [id number] Margie Billet Diagnosing Phys: Eleonore Chiquito MD IMPRESSIONS  1. Left ventricular ejection fraction, by estimation, is 60 to 65%. The left ventricle has normal function. The left ventricle has no regional wall motion abnormalities. Left ventricular diastolic function could not be evaluated.  2. Right ventricular systolic function is normal. The right ventricular size is mildly enlarged.  3. The mitral valve is grossly normal. Trivial mitral valve regurgitation. No evidence of mitral stenosis.  4. The aortic valve was not  well visualized. Aortic valve regurgitation is not visualized. No aortic stenosis is present. Comparison(s): Changes from prior study are noted. Afib is now present. EF unchanged. FINDINGS  Left Ventricle: Left ventricular ejection fraction, by estimation, is 60 to 65%. The left ventricle has normal function. The left ventricle has no regional wall motion abnormalities. The left ventricular internal cavity size was normal in size. There is  no left ventricular hypertrophy. Left ventricular diastolic function could not be evaluated. Left ventricular diastolic function could not be evaluated due to atrial fibrillation. Right Ventricle: The right ventricular size is mildly enlarged. No increase in right ventricular wall thickness. Right ventricular systolic function is normal. Mitral Valve: The mitral valve is grossly normal. Trivial mitral valve regurgitation. No evidence of mitral valve stenosis. Tricuspid Valve: The tricuspid valve is grossly normal. Tricuspid valve regurgitation is trivial. No evidence of tricuspid stenosis. Aortic Valve: The aortic valve was not well visualized. Aortic valve regurgitation is not visualized. No aortic stenosis is present. Aortic valve mean gradient measures 4.0 mmHg. Aortic valve peak gradient measures 6.6 mmHg. LEFT VENTRICLE PLAX 2D LVIDd:         4.00 cm LVIDs:         2.60 cm LV PW:         1.00 cm LV IVS:        1.20 cm  RIGHT VENTRICLE TAPSE (M-mode): 2.2 cm LEFT ATRIUM         Index LA diam:    4.00 cm 2.23 cm/m  AORTIC VALVE AV Vmax:      128.00 cm/s AV Vmean:     86.900 cm/s AV VTI:       0.185 m AV Peak Grad: 6.6 mmHg AV Mean Grad: 4.0 mmHg Eleonore Chiquito MD Electronically signed by Eleonore Chiquito MD Signature Date/Time: 02/02/2022/4:20:06 PM    Final (Updated)    US BREAST LTD UNI RIGHT INC AXILLA  Result Date: 02/02/2022 CLINICAL DATA:  Pain and redness of the right breast upper outer quadrant, status post right lumpectomy in March 2023. EXAM: ULTRASOUND OF THE RIGHT  BREAST COMPARISON:  Previous exam(s). FINDINGS: Targeted ultrasound is performed, showing no suspicious masses, shadowing lesions or fluid collections. IMPRESSION: No suspicious masses or evidence of abscess in the right breast 9-12 o'clock. RECOMMENDATION: Recommend further evaluation with diagnostic mammogram given the patient's presenting symptoms and history. I have discussed the findings and recommendations with the patient. If applicable, a reminder letter will be sent to the patient regarding the next appointment. BI-RADS CATEGORY  2: Benign. Electronically Signed   By: Fidela Salisbury M.D.   On: 02/02/2022 15:00   CT Angio Chest Pulmonary Embolism (PE) W or WO Contrast  Result Date: 02/02/2022 CLINICAL DATA:  66 year old female with right side chest pain radiating to the back, shortness of breath. Hypoxic at presentation. EXAM: CT ANGIOGRAPHY CHEST WITH CONTRAST TECHNIQUE: Multidetector CT imaging of the chest was performed using  the standard protocol during bolus administration of intravenous contrast. Multiplanar CT image reconstructions and MIPs were obtained to evaluate the vascular anatomy. RADIATION DOSE REDUCTION: This exam was performed according to the departmental dose-optimization program which includes automated exposure control, adjustment of the mA and/or kV according to patient size and/or use of iterative reconstruction technique. CONTRAST:  68m OMNIPAQUE IOHEXOL 350 MG/ML SOLN COMPARISON:  Portable chest 0139 hours today.  CTA chest 01/10/2022. FINDINGS: Cardiovascular: Good contrast bolus timing in the pulmonary arterial tree. Mild respiratory motion. No pulmonary artery filling defect identified. Some central pulmonary artery enlargement (series 5, image 117). Calcified coronary artery atherosclerosis and/or stents. Calcified aortic atherosclerosis. No cardiomegaly or pericardial effusion. Mediastinum/Nodes: Reactive appearing mediastinal and hilar lymph nodes appear stable from  last month. No mediastinal mass. Right axillary and superior breast surgical clips. Lungs/Pleura: Regressed but not completely resolved bilateral pleural effusions since last month. Enhancing lung base atelectasis greater on the left. Similar lung volumes with increased diffuse pulmonary septal thickening since last month (series 6, image 36). Major airways remain patent. No consolidation. Regressed lateral segment right middle lobe atelectasis, but increased lingula atelectasis since last month. Upper Abdomen: Negative visible liver, spleen, adrenal glands, kidneys and stomach in the upper abdomen. No upper abdominal free air or free fluid. Musculoskeletal: Stable.  No acute osseous abnormality identified. Review of the MIP images confirms the above findings. IMPRESSION: 1. Negative for acute pulmonary embolus. 2. Increased diffuse pulmonary septal thickening since last month. Regressed but not completely resolved bilateral pleural effusions. Stable reactive appearing mediastinal lymph nodes. Consider acute pulmonary edema versus viral/atypical respiratory infection. Chronic interstitial lung disease felt less likely. 3. Calcified coronary artery and Aortic Atherosclerosis (ICD10-I70.0). Electronically Signed   By: HGenevie AnnM.D.   On: 02/02/2022 06:05   DG Chest Port 1 View  Result Date: 02/02/2022 CLINICAL DATA:  Sepsis EXAM: PORTABLE CHEST 1 VIEW COMPARISON:  01/13/2022 FINDINGS: The heart size and mediastinal contours are within normal limits. Both lungs are clear. The visualized skeletal structures are unremarkable. Lungs are hyperinflated. IMPRESSION: No active disease. Electronically Signed   By: KUlyses JarredM.D.   On: 02/02/2022 01:49          No data to display          No results found for: "NITRICOXIDE"      Assessment & Plan:   COPD exacerbation ([place] Recent COPD exacerbation now improving.  Patient is to finish up prednisone as directed  Plan  Patient Instructions  Continue   on Breztri 2 puffs Twice daily  , rinse after use.  Albuterol inhaler /neg As needed  Continue on Oxygen 4l/m  Follow up with Cardiololgy next week as planned and As needed  Activity as tolerated.  Follow up with Dr. IValeta Harmsin 3 weeks as planned and As needed   Please contact office for sooner follow up if symptoms do not improve or worsen or seek emergency care        Chronic respiratory failure with hypoxia, on home O2 therapy ([place] - 3 L/min Continue on oxygen maintain O2 saturations greater than 88 to 90%  Atrial fibrillation with rapid ventricular response ([place] Episode of A-fib with RVR during hospitalization.  Now improved with current regimen.  Continue on Eliquis.  Follow-up with cardiology as planned  Plan  Patient Instructions  Continue  on Breztri 2 puffs Twice daily  , rinse after use.  Albuterol inhaler /neg As needed  Continue on Oxygen 4l/m  Follow up  with Cardiololgy next week as planned and As needed  Activity as tolerated.  Follow up with Dr. [name] in 3 weeks as planned and As needed   Please contact office for sooner follow up if symptoms do not improve or worsen or seek emergency care          Rexene Edison, NP 02/16/2022

## 2022-02-16 NOTE — Assessment & Plan Note (Signed)
Continue on oxygen maintain O2 saturations greater than 88 to 90%. 

## 2022-02-16 NOTE — Assessment & Plan Note (Signed)
Episode of A-fib with RVR during hospitalization.  Now improved with current regimen.  Continue on Eliquis.  Follow-up with cardiology as planned  Plan  Patient Instructions  Continue  on Breztri 2 puffs Twice daily  , rinse after use.  Albuterol inhaler /neg As needed  Continue on Oxygen 4l/m  Follow up with Cardiololgy next week as planned and As needed  Activity as tolerated.  Follow up with Dr. [name] in 3 weeks as planned and As needed   Please contact office for sooner follow up if symptoms do not improve or worsen or seek emergency care

## 2022-02-16 NOTE — Patient Instructions (Addendum)
Continue  on Breztri 2 puffs Twice daily  , rinse after use.  Albuterol inhaler /neg As needed  Continue on Oxygen 4l/m  Follow up with Cardiololgy next week as planned and As needed  Activity as tolerated.  Follow up with Dr. [name] in 3 weeks as planned and As needed   Please contact office for sooner follow up if symptoms do not improve or worsen or seek emergency care

## 2022-02-16 NOTE — Assessment & Plan Note (Signed)
Recent COPD exacerbation now improving.  Patient is to finish up prednisone as directed  Plan  Patient Instructions  Continue  on Breztri 2 puffs Twice daily  , rinse after use.  Albuterol inhaler /neg As needed  Continue on Oxygen 4l/m  Follow up with Cardiololgy next week as planned and As needed  Activity as tolerated.  Follow up with Caroline Greer in 3 weeks as planned and As needed   Please contact office for sooner follow up if symptoms do not improve or worsen or seek emergency care

## 2022-02-22 ENCOUNTER — Telehealth: Payer: Self-pay

## 2022-02-22 NOTE — Telephone Encounter (Signed)
Glad to see back in office for reevaluation -just finished abx/steroids   Tried to call , no answer , unable to leave vm  Please contact office for sooner follow up if symptoms do not improve or worsen or seek emergency care

## 2022-02-22 NOTE — Telephone Encounter (Signed)
Spoke with pt who states congestion has increased. Offer pt both in person and virtual visit but pt refused both. Pt is scheduled for OV with Dr. [name] on 03/08/22. Pt says she will wait until then to see a provider. Pt instructed that if symptoms get worse to seek emergent medical evaluation. Nothing further needed at this time.

## 2022-02-22 NOTE — Telephone Encounter (Signed)
Primary Pulmonologist: [facility]  Last office visit and with whom: TP What do we see them for (pulmonary problems): 02/16/22 Last OV assessment/plan: COPD exacerbation ([place]) Recent COPD exacerbation now improving.  Patient is to finish up prednisone as directed   Plan  Patient Instructions  Continue  on Breztri 2 puffs Twice daily  , rinse after use.  Albuterol inhaler /neg As needed  Continue on Oxygen 4l/m  Follow up with Cardiololgy next week as planned and As needed  Activity as tolerated.  Follow up with Dr. [name] in 3 weeks as planned and As needed   Please contact office for sooner follow up if symptoms do not improve or worsen or seek emergency care            Chronic respiratory failure with hypoxia, on home O2 therapy ([place]) - 3 L/min Continue on oxygen maintain O2 saturations greater than 88 to 90%   Atrial fibrillation with rapid ventricular response ([place]) Episode of A-fib with RVR during hospitalization.  Now improved with current regimen.  Continue on Eliquis.  Follow-up with cardiology as planned   Plan  Patient Instructions  Continue  on Breztri 2 puffs Twice daily  , rinse after use.  Albuterol inhaler /neg As needed  Continue on Oxygen 4l/m  Follow up with Cardiololgy next week as planned and As needed  Activity as tolerated.  Follow up with Dr. [name] in 3 weeks as planned and As needed   Please contact office for sooner follow up if symptoms do not improve or worsen or seek emergency care   Was appointment offered to patient (explain)?  N/A   Reason for call: Pt's home health nurse and into office and stated over the weekend pt had increased SOB and increased SOB. Sputum is thick and gray. Pt finished Pred taper last Thursday. Nurse denied any GI upset, fever or chills. Tammy will you please advise a you were last to see pt. Nurse did confirm pt could be called after advise was given.   (examples of things to ask: : When did symptoms start? Fever? Cough?  Productive? Color to sputum? More sputum than usual? Wheezing? Have you needed increased oxygen? Are you taking your respiratory medications? What over the counter measures have you tried?)  Allergies  Allergen Reactions   Crab [Shellfish Allergy] Anaphylaxis and Swelling    Dungeness crab   Erythromycin Other (See Comments)    Convulsion, Tolerated Azithro 01/2022   Hydrocodone Nausea And Vomiting    Severe vomiting      There is no immunization history on file for this patient.

## 2022-02-25 ENCOUNTER — Ambulatory Visit (HOSPITAL_COMMUNITY): Payer: Medicare Other | Admitting: Nurse Practitioner

## 2022-02-26 ENCOUNTER — Telehealth: Payer: Self-pay | Admitting: Pulmonary Disease

## 2022-02-26 NOTE — Telephone Encounter (Signed)
Called patient directly. She did not answer. Left message for patient to call back.

## 2022-03-05 MED ORDER — PREDNISONE 10 MG PO TABS: ORAL_TABLET | ORAL | 0 refills | Status: AC

## 2022-03-05 NOTE — Telephone Encounter (Signed)
Spoke with the pt  She states having increased SOB, wheezing and chest tightness for the past wk  She states that she is not coughing much  Denies any fevers, aches  She is taking her Breztri bid and duonebs tid  She says neb helps some  Taking lasix 40 mg daily past few days  Little edema in her feet She wonders if needs steroids to stay out of the hospital  She does have appt on 03/08/22 with BI   Dr Lamonte Sakai, please advise thanks!  Allergies  Allergen Reactions   Crab [Shellfish Allergy] Anaphylaxis and Swelling    Dungeness crab   Erythromycin Other (See Comments)    Convulsion, Tolerated Azithro 01/2022   Hydrocodone Nausea And Vomiting    Severe vomiting

## 2022-03-05 NOTE — Telephone Encounter (Signed)
Called and spoke with pt letting her know recs per RB and she verbalized understanding. Rx for pred taper has been sent to pharmacy for pt. Nothing further needed.

## 2022-03-05 NOTE — Telephone Encounter (Signed)
Ok to start pred > Take '40mg'$  daily for 3 days, then '30mg'$  daily for 3 days, then '20mg'$  daily for 3 days, then '10mg'$  daily for 3 days, then stop Follow next week as planned

## 2022-03-08 ENCOUNTER — Ambulatory Visit (INDEPENDENT_AMBULATORY_CARE_PROVIDER_SITE_OTHER): Payer: Medicare Other | Admitting: Pulmonary Disease

## 2022-03-08 ENCOUNTER — Encounter: Payer: Self-pay | Admitting: Pulmonary Disease

## 2022-03-08 VITALS — BP 106/64 | HR 102 | Temp 99.0°F | Ht 70.0 in | Wt 149.8 lb

## 2022-03-08 DIAGNOSIS — J9611 Chronic respiratory failure with hypoxia: Secondary | ICD-10-CM | POA: Diagnosis not present

## 2022-03-08 DIAGNOSIS — R918 Other nonspecific abnormal finding of lung field: Secondary | ICD-10-CM | POA: Diagnosis not present

## 2022-03-08 DIAGNOSIS — Z9981 Dependence on supplemental oxygen: Secondary | ICD-10-CM

## 2022-03-08 DIAGNOSIS — J441 Chronic obstructive pulmonary disease with (acute) exacerbation: Secondary | ICD-10-CM | POA: Diagnosis not present

## 2022-03-08 DIAGNOSIS — Z923 Personal history of irradiation: Secondary | ICD-10-CM | POA: Diagnosis not present

## 2022-03-08 NOTE — Progress Notes (Signed)
Synopsis: Referred in September 2020 for shortness of breath by [name], MD  Subjective:   PATIENT ID: Caroline Greer GENDER: female DOB: 1955/08/31, MRN: [id number]  Chief Complaint  Patient presents with   Follow-up    No concerns, congested, phlegm ( thick grey)     Patient with a past medical history of hypertension, problem list includes COPD.  No prior PFTs in epic.  Patient was admitted to the hospital on 01/05/2019 for COPD exacerbation.  Patient was discharged from the hospital on 01/07/2019.  Recommending outpatient pulmonary follow-up.  Admitted to the hospital on 814 with medical history of hypertension depression tobacco abuse and COPD worsening dyspnea over the past 3 days.  Started after she was mowing her grass.  Included symptoms of wheezing coughing no chest pain and no fevers.  COVID negative.  Was found to be hypoxemic oxygen saturations in the 90s on 6 L nasal cannula tachycardic.  EKG with right axis deviation no ST changes negative T waves in V1 through 4.  Patient was treated with bronchodilators systemic steroids and discharged on 3 L nasal cannula.  Patient was encouraged to use quit smoking.  Today, overall in office patient has been doing well since her hospitalization.  She has been slowly increasing her exercise tolerance.  She has been mowing her grass.  She currently lives alone.  She enjoys fishing.  She plans on going this weekend.  Unfortunately she is still smoking.  She is been trying to cut down but finds that it is very difficult.  She has been working with her insurance to help cover cost of inhalers however most inhalers have been very expensive.  She has been relying on receiving samples from her PCP.  We will also help her receive samples.  If needed we may be able to apply for financial assistance through manufacturer.  She does have daily dyspnea on exertion cough and shortness of breath.  She uses her nebulizer regularly at home.  Denies weight loss  fevers or hemoptysis.  OV 03/07/2019: Patient here today for COPD follow-up.  At last office visit patient was started on Trelegy inhaler.  Patient has been doing very well on Trelegy inhaler after start of the medication last office visit.  She needs refills of this medication today would like to have samples.  Also given refill of her albuterol inhaler.  Today we discussed her CT scan results which revealed multiple bilateral pulmonary nodules all of which were stable and benign in comparison to previous.  She does have evidence of emphysema.  Otherwise her dyspnea on exertion is doing well.  Delay in scheduling of her PFTs due to [place].  I think we can push this off until her repeat follow-up with [place] in 6 months.  Otherwise patient denies dyspnea on exertion nausea vomiting diarrhea chest pain.  No hemoptysis.  OV 07/18/2019: Patient seen today for follow-up of COPD.  Also has pulmonary nodules.  Patient last seen in the office by Derl Barrow, NP.  Was unable to get PFTs completed due to rescheduling due to Covid.  At the time had side effects from Trelegy.  This was stopped trialed off of inhaled steroids started on Stiolto.  Unfortunately at this time was still smoking.  Patient was also referred to lung cancer screening program and offered smoking cessation counseling.  Currently managed with anoro.  She feels like she was breathing much better on the Trelegy.  She comes today hypoxemic in the room sats in  the 70s.  She did not bring her home oxygen with her into the building.  We put her on oxygen here today she feels breathing much better at this time.  Of note she was able to quit smoking.  She did meet with pharmacy x2 since last office visit.  She does describe ice eating today and urged to eat ice.  She has not had a colonoscopy before.  She denies dark stools.  OV 07/02/2021: Here today for follow-up regarding COPD as well as clearance for surgery.  Last seen in the office in February 2021.  She was  still smoking at the time.Patient was referred for pulmonary clearance with plans for the lumpectomy by [facility] surgery.  Patient had mammogram in December followed by ultrasound-guided needle biopsy confirming mammary carcinoma of the breast.  From a respiratory standpoint she is doing well.  Still using her Breztri inhaler.  OV 11/30/2021: Patient just completed radiation treatments for her breast cancer.  This was completed in May.  She had a flareup of COPD treated with antibiotics and steroids recently with primary care.  She still has antibiotics to complete this week.  She does feel like she is breathing better.  She is not entirely sure that is just related to the [facility] inhaler.  She said she has been using her inhalers but since she completed radiation she is feeling more short of breath.  She does feel like she is getting better after being treated with antibiotics by her primary care provider.  I encouraged her to stay on her triple therapy inhaler regimen.  OV 01/07/2022: Here today for hospital follow-up.  Was last seen by me in July for COPD.  Managed on her triple therapy inhaler regimen. Patient had a COPD exacerbation was admitted to the hospital on 12/09/2021.  Discharge summary from Dr. Grandville Silos reviewed she was treated for COPD exacerbation oxygen, IV steroids Pulmicort Brovana IV Rocephin for a 5-day course.  She was given a few doses of Lasix.  She improved and was discharged home.  She had office note on 12/21/2021 by Dr. [name] her medical oncologist.  Currently received adjuvant radiation treatments through the middle of May.  Was started on antiestrogen therapy and did not tolerate either medications.   OV 03/08/2022: Here today for follow-up.  Patient feels better.  Was recently treated with steroids for an exacerbation as well as increase in her Lasix to once a day.  She feels better.  I think that she looks clinically dry and can go back to her Lasix every other day like she  was taken before.  She needs to complete her steroid taper.  She states that she is using her Breztri inhaler twice daily as well as stopping smoking.  She has a follow-up appointment in the atrial fibrillation clinic as well.     Past Medical History:  Diagnosis Date   Acute respiratory failure with hypoxia ([place]) 09/30/2018   Breast cancer ([place])    Constipation    COPD exacerbation ([place]) 01/05/2019   Depression with anxiety 09/30/2018   Dyspnea    Essential hypertension 09/30/2018   History of radiation therapy    Right breast- 09/01/21-10/16/21- Dr. Gery Pray   Hyperlipidemia 02/02/2022   Hypertension    Tobacco dependence due to cigarettes 09/30/2018     Family History  Problem Relation Age of Onset   COPD Mother    Esophageal cancer Maternal Aunt    Esophageal cancer Maternal Grandmother    Prostate  cancer Other    Prostate cancer Other    Parkinson's disease Other      Past Surgical History:  Procedure Laterality Date   BREAST LUMPECTOMY WITH RADIOACTIVE SEED AND SENTINEL LYMPH NODE BIOPSY Right 07/23/2021   Procedure: RIGHT BREAST LUMPECTOMY WITH RADIOACTIVE SEED AND SENTINEL LYMPH NODE BIOPSY;  Surgeon: Jovita Kussmaul, MD;  Location: [facility];  Service: General;  Laterality: Right;   HAND SURGERY Right 1997   Hand Fracture (right thumb & hand)   LAPAROTOMY  1974   RIGHT/LEFT HEART CATH AND CORONARY ANGIOGRAPHY N/A 10/02/2019   Procedure: RIGHT/LEFT HEART CATH AND CORONARY ANGIOGRAPHY;  Surgeon: Sherren Mocha, MD;  Location: [facility];  Service: Cardiovascular;  Laterality: N/A;   THORACENTESIS N/A 01/12/2022   Procedure: THORACENTESIS;  Surgeon: Juanito Doom, MD;  Location: [facility];  Service: Cardiopulmonary;  Laterality: N/A;    Social History   Socioeconomic History   Marital status: Single    Spouse name: Not on file   Number of children: Not on file   Years of education: Not on file   Highest education level: Not on file  Occupational  History   Not on file  Tobacco Use   Smoking status: Former    Packs/day: 0.80    Years: 43.00    Total pack years: 34.40    Types: Cigarettes   Smokeless tobacco: Never   Tobacco comments:    Quit smoking 2 - 3 months a go 11/30/21  Vaping Use   Vaping Use: Never used  Substance and Sexual Activity   Alcohol use: Yes    Comment: gets 6 beers on a weekend and drinks them   Drug use: Not Currently    Types: Marijuana    Comment: tried to smoke some about a month ago   Sexual activity: Not on file  Other Topics Concern   Not on file  Social History Narrative   Not on file   Social Determinants of Health   Financial Resource Strain: Not on file  Food Insecurity: No Food Insecurity (02/03/2022)   Hunger Vital Sign    Worried About Running Out of Food in the Last Year: Never true    Ran Out of Food in the Last Year: Never true  Transportation Needs: No Transportation Needs (02/03/2022)   PRAPARE - Hydrologist (Medical): No    Lack of Transportation (Non-Medical): No  Physical Activity: Not on file  Stress: Not on file  Social Connections: Not on file  Intimate Partner Violence: Not At Risk (02/03/2022)   Humiliation, Afraid, Rape, and Kick questionnaire    Fear of Current or Ex-Partner: No    Emotionally Abused: No    Physically Abused: No    Sexually Abused: No     Allergies  Allergen Reactions   Crab [Shellfish Allergy] Anaphylaxis and Swelling    Dungeness crab   Erythromycin Other (See Comments)    Convulsion, Tolerated Azithro 01/2022   Hydrocodone Nausea And Vomiting    Severe vomiting      Outpatient Medications Prior to Visit  Medication Sig Dispense Refill   acetaminophen (TYLENOL) 500 MG tablet Take 1,000 mg by mouth daily as needed (pain).     ALPRAZolam (XANAX) 0.5 MG tablet Take 0.5 mg by mouth at bedtime as needed for sleep.     amLODipine (NORVASC) 5 MG tablet Take 1 tablet (5 mg total) by mouth daily. 30 tablet 1    apixaban (ELIQUIS)  5 MG TABS tablet Take 1 tablet (5 mg total) by mouth 2 (two) times daily. 60 tablet 1   aspirin EC 81 MG tablet Take 81 mg by mouth daily.     Budeson-Glycopyrrol-Formoterol (BREZTRI AEROSPHERE) 160-9-4.8 MCG/ACT AERO Inhale 2 puffs into the lungs in the morning and at bedtime. 32.1 g 1   Cholecalciferol (VITAMIN D3) 25 MCG (1000 UT) CAPS Take 1,000 Units by mouth daily.     exemestane (AROMASIN) 25 MG tablet Take 0.5 tablets (12.5 mg total) by mouth daily after breakfast. 60 tablet 3   fluticasone (FLONASE) 50 MCG/ACT nasal spray Place 2 sprays into both nostrils 2 (two) times daily.     furosemide (LASIX) 40 MG tablet Take 1 tablet (40 mg total) by mouth daily as needed. Take 1 tablet daily x 2 days, then as needed. 20 tablet 0   guaiFENesin (MUCINEX) 600 MG 12 hr tablet Take 600 mg by mouth 2 (two) times daily as needed for to loosen phlegm.     ipratropium-albuterol (DUONEB) 0.5-2.5 (3) MG/3ML SOLN Use 1 vial via nebulizer 2 times daily for 3 days then 2 times daily as need.(shortness of breath) 360 mL 1   metoprolol succinate (TOPROL-XL) 25 MG 24 hr tablet Take 1 tablet (25 mg total) by mouth daily. 30 tablet 1   pantoprazole (PROTONIX) 40 MG tablet Take 1 tablet (40 mg total) by mouth daily at 6 (six) AM. 30 tablet 1   predniSONE (DELTASONE) 10 MG tablet Take 4 tablets (40 mg total) by mouth daily with breakfast for 3 days, THEN 3 tablets (30 mg total) daily with breakfast for 3 days, THEN 2 tablets (20 mg total) daily with breakfast for 3 days, THEN 1 tablet (10 mg total) daily with breakfast for 3 days. 30 tablet 0   PROAIR HFA 108 (90 Base) MCG/ACT inhaler Inhale 2 puffs into the lungs every 6 (six) hours as needed for wheezing or shortness of breath. 90 day supply 24 g 3   vitamin B-12 (CYANOCOBALAMIN) 500 MCG tablet Take 500 mcg by mouth daily.     Vitamin E Skin OIL Apply 1 Application topically daily.     No facility-administered medications prior to visit.     Review of Systems  Constitutional:  Negative for chills, fever, malaise/fatigue and weight loss.  HENT:  Negative for hearing loss, sore throat and tinnitus.   Eyes:  Negative for blurred vision and double vision.  Respiratory:  Positive for cough and shortness of breath. Negative for hemoptysis, sputum production, wheezing and stridor.   Cardiovascular:  Negative for chest pain, palpitations, orthopnea, leg swelling and PND.  Gastrointestinal:  Negative for abdominal pain, constipation, diarrhea, heartburn, nausea and vomiting.  Genitourinary:  Negative for dysuria, hematuria and urgency.  Musculoskeletal:  Negative for joint pain and myalgias.  Skin:  Negative for itching and rash.  Neurological:  Negative for dizziness, tingling, weakness and headaches.  Endo/Heme/Allergies:  Negative for environmental allergies. Does not bruise/bleed easily.  Psychiatric/Behavioral:  Negative for depression. The patient is not nervous/anxious and does not have insomnia.   All other systems reviewed and are negative.    Objective:  Physical Exam Vitals reviewed.  Constitutional:      General: She is not in acute distress.    Appearance: She is well-developed.  HENT:     Head: Normocephalic and atraumatic.  Eyes:     General: No scleral icterus.    Conjunctiva/sclera: Conjunctivae normal.     Pupils: Pupils are equal, round,  and reactive to light.  Neck:     Vascular: No JVD.     Trachea: No tracheal deviation.  Cardiovascular:     Rate and Rhythm: Normal rate. Rhythm irregular.     Heart sounds: Normal heart sounds. No murmur heard. Pulmonary:     Effort: Pulmonary effort is normal. No tachypnea, accessory muscle usage or respiratory distress.     Breath sounds: No stridor. No wheezing, rhonchi or rales.     Comments: Diminished breath sounds bilaterally Abdominal:     General: There is no distension.     Palpations: Abdomen is soft.     Tenderness: There is no abdominal tenderness.   Musculoskeletal:        General: No tenderness.     Cervical back: Neck supple.  Lymphadenopathy:     Cervical: No cervical adenopathy.  Skin:    General: Skin is warm and dry.     Capillary Refill: Capillary refill takes less than 2 seconds.     Findings: No rash.  Neurological:     Mental Status: She is alert and oriented to person, place, and time.  Psychiatric:        Behavior: Behavior normal.      Vitals:   03/08/22 1500  BP: 106/64  Pulse: (!) 102  Temp: 99 F (37.2 C)  TempSrc: Oral  SpO2: (!) 89%  Weight: 149 lb 12.8 oz (67.9 kg)  Height: '5\' 10"'$  (1.778 m)   (!) 89% on RA,, letting her rest rechecking on oxygen her O2 sats come up to 92 on 2 L BMI Readings from Last 3 Encounters:  03/08/22 21.49 kg/m  02/16/22 20.66 kg/m  01/19/22 20.13 kg/m   Wt Readings from Last 3 Encounters:  03/08/22 149 lb 12.8 oz (67.9 kg)  02/16/22 144 lb (65.3 kg)  01/19/22 140 lb 4.8 oz (63.6 kg)     CBC    Component Value Date/Time   WBC 10.0 02/08/2022 0320   RBC 4.52 02/08/2022 0320   HGB 13.2 02/08/2022 0320   HGB 15.6 (H) 06/03/2021 1234   HGB 17.3 (H) 09/26/2019 1650   HCT 42.8 02/08/2022 0320   HCT 52.2 (H) 09/26/2019 1650   PLT 281 02/08/2022 0320   PLT 247 06/03/2021 1234   PLT 236 09/26/2019 1650   MCV 94.7 02/08/2022 0320   MCV 88 09/26/2019 1650   MCH 29.2 02/08/2022 0320   MCHC 30.8 02/08/2022 0320   RDW 16.7 (H) 02/08/2022 0320   RDW 19.4 (H) 09/26/2019 1650   LYMPHSABS 1.9 02/08/2022 0320   MONOABS 0.9 02/08/2022 0320   EOSABS 0.2 02/08/2022 0320   BASOSABS 0.0 02/08/2022 0320    Chest Imaging: CXR - 8/14 Cardiomegaly, evidence of emphysema  The patient's images have been independently reviewed by me.   09/30/2018 CT chest: Multiple bilateral pulmonary nodules largest 6 mm the right upper lobe recommending noncontrast CT follow-up 3 to 6 months. The patient's images have been independently reviewed by me.    CT chest 02/22/2019: Previously  noted pulmonary nodules decreased in size considered benign. Diffuse bronchial wall thickening consistent with COPD history. Three-vessel coronary disease. The patient's images have been independently reviewed by me.    CT chest 12/09/2021: No evidence of PE, 5 mm right upper lobe pulmonary nodule unchanged since 2021 considered benign. Small pleural effusion The patient's images have been independently reviewed by me.    Pulmonary Functions Testing Results:     No data to display  FeNO: None   Pathology: None   05/20/2021: Breast, right, needle core biopsy, 11 o'clock 8cmfn - INVASIVE MAMMARY CARCINOMA, GRADE 2, INVOLVING FOUR OF FOUR BIOPSY FRAGMENTS. - TUMOR RANGES TO 0.8 CM IN MAXIMUM EXTENT  Echocardiogram: None   Heart Catheterization: None     Assessment & Plan:     ICD-10-CM   1. COPD exacerbation ([place])  J44.1     2. Chronic respiratory failure with hypoxia, on home O2 therapy ([facility]) - 3 L/min  J96.11    Z99.81     3. Multiple lung nodules  R91.8     4. History of radiation therapy  Z92.3        Assessment:   This is a 66 year old female, centrilobular emphysema, likely advanced COPD, oxygen dependent respiratory failure, former smoker, currently in her lung cancer screening program.  She had an exacerbation recently.  Was again placed on steroids.  She has been treated with her breast cancer for surgery and radiation treatments completed in May of this year.  She is currently on antiestrogen therapy.  Plan: She has had yet another exacerbation.  She is improving now with steroid taper. She also increased her Lasix to every day for a few days I think she can go back to every other day. Last time we question compliance of her Judithann Sauger but she states that she has been using her inhaler twice a day. Continue use of albuterol as needed We talked about the use of other medications such as Daliresp and azithromycin that she is declined in the past due to  medication side effects. Continue DME supplies for her oxygen concentrator and tubing as needed.  Return to see [place] in 6 months. She has a repeat lung cancer screening CT in July 2024.     Current Outpatient Medications:    acetaminophen (TYLENOL) 500 MG tablet, Take 1,000 mg by mouth daily as needed (pain)., Disp: , Rfl:    ALPRAZolam (XANAX) 0.5 MG tablet, Take 0.5 mg by mouth at bedtime as needed for sleep., Disp: , Rfl:    amLODipine (NORVASC) 5 MG tablet, Take 1 tablet (5 mg total) by mouth daily., Disp: 30 tablet, Rfl: 1   apixaban (ELIQUIS) 5 MG TABS tablet, Take 1 tablet (5 mg total) by mouth 2 (two) times daily., Disp: 60 tablet, Rfl: 1   aspirin EC 81 MG tablet, Take 81 mg by mouth daily., Disp: , Rfl:    Budeson-Glycopyrrol-Formoterol (BREZTRI AEROSPHERE) 160-9-4.8 MCG/ACT AERO, Inhale 2 puffs into the lungs in the morning and at bedtime., Disp: 32.1 g, Rfl: 1   Cholecalciferol (VITAMIN D3) 25 MCG (1000 UT) CAPS, Take 1,000 Units by mouth daily., Disp: , Rfl:    exemestane (AROMASIN) 25 MG tablet, Take 0.5 tablets (12.5 mg total) by mouth daily after breakfast., Disp: 60 tablet, Rfl: 3   fluticasone (FLONASE) 50 MCG/ACT nasal spray, Place 2 sprays into both nostrils 2 (two) times daily., Disp: , Rfl:    furosemide (LASIX) 40 MG tablet, Take 1 tablet (40 mg total) by mouth daily as needed. Take 1 tablet daily x 2 days, then as needed., Disp: 20 tablet, Rfl: 0   guaiFENesin (MUCINEX) 600 MG 12 hr tablet, Take 600 mg by mouth 2 (two) times daily as needed for to loosen phlegm., Disp: , Rfl:    ipratropium-albuterol (DUONEB) 0.5-2.5 (3) MG/3ML SOLN, Use 1 vial via nebulizer 2 times daily for 3 days then 2 times daily as need.(shortness of breath), Disp: 360 mL, Rfl: 1  metoprolol succinate (TOPROL-XL) 25 MG 24 hr tablet, Take 1 tablet (25 mg total) by mouth daily., Disp: 30 tablet, Rfl: 1   pantoprazole (PROTONIX) 40 MG tablet, Take 1 tablet (40 mg total) by mouth daily at 6 (six) AM.,  Disp: 30 tablet, Rfl: 1   predniSONE (DELTASONE) 10 MG tablet, Take 4 tablets (40 mg total) by mouth daily with breakfast for 3 days, THEN 3 tablets (30 mg total) daily with breakfast for 3 days, THEN 2 tablets (20 mg total) daily with breakfast for 3 days, THEN 1 tablet (10 mg total) daily with breakfast for 3 days., Disp: 30 tablet, Rfl: 0   PROAIR HFA 108 (90 Base) MCG/ACT inhaler, Inhale 2 puffs into the lungs every 6 (six) hours as needed for wheezing or shortness of breath. 90 day supply, Disp: 24 g, Rfl: 3   vitamin B-12 (CYANOCOBALAMIN) 500 MCG tablet, Take 500 mcg by mouth daily., Disp: , Rfl:    Vitamin E Skin OIL, Apply 1 Application topically daily., Disp: , Rfl:    Garner Nash, DO [facility] 03/08/2022 3:39 PM

## 2022-03-08 NOTE — Patient Instructions (Signed)
Thank you for visiting Dr. [name] at [facility]. Today we recommend the following:  Continue breztri inhaler  Complete prednisone taper   Return in about 6 months (around 09/07/2022).    Please do your part to reduce the spread of COVID-19.

## 2022-03-16 ENCOUNTER — Ambulatory Visit (HOSPITAL_COMMUNITY)
Admission: RE | Admit: 2022-03-16 | Discharge: 2022-03-16 | Disposition: A | Discharge status: Home / Self Care | Payer: Medicare Other | Source: Ambulatory Visit | Attending: Nurse Practitioner | Admitting: Nurse Practitioner

## 2022-03-16 ENCOUNTER — Encounter (HOSPITAL_COMMUNITY): Payer: Self-pay | Admitting: Nurse Practitioner

## 2022-03-16 VITALS — BP 144/86 | HR 91 | Ht 70.0 in | Wt 152.2 lb

## 2022-03-16 DIAGNOSIS — Z7901 Long term (current) use of anticoagulants: Secondary | ICD-10-CM | POA: Diagnosis not present

## 2022-03-16 DIAGNOSIS — I4891 Unspecified atrial fibrillation: Secondary | ICD-10-CM | POA: Insufficient documentation

## 2022-03-16 DIAGNOSIS — J449 Chronic obstructive pulmonary disease, unspecified: Secondary | ICD-10-CM | POA: Insufficient documentation

## 2022-03-16 DIAGNOSIS — J9691 Respiratory failure, unspecified with hypoxia: Secondary | ICD-10-CM | POA: Insufficient documentation

## 2022-03-16 DIAGNOSIS — D6869 Other thrombophilia: Secondary | ICD-10-CM

## 2022-03-16 LAB — CBC
HCT: 48.2 % — ABNORMAL HIGH (ref 36.0–46.0)
Hemoglobin: 15.3 g/dL — ABNORMAL HIGH (ref 12.0–15.0)
MCH: 29.9 pg (ref 26.0–34.0)
MCHC: 31.7 g/dL (ref 30.0–36.0)
MCV: 94.3 fL (ref 80.0–100.0)
Platelets: 307 10*3/uL (ref 150–400)
RBC: 5.11 MIL/uL (ref 3.87–5.11)
RDW: 19 % — ABNORMAL HIGH (ref 11.5–15.5)
WBC: 14.9 10*3/uL — ABNORMAL HIGH (ref 4.0–10.5)
nRBC: 0 % (ref 0.0–0.2)

## 2022-03-16 NOTE — Progress Notes (Signed)
Primary Care Physician: [name], MD Referring Physician: [facility] ED f/u    Caroline Greer is a 66 y.o. female with a h/o  COPD, chronic hypoxic respiratory failure, right breast cancer s/p lumpectomy and radiation, depression/anxiety, HTN, HLD, tobacco use, and nonobstructive CAD managed medically (last cath 09/2019) who presented with right-sided chest pain to [facility] ED 02/05/22 and was admitted then d/c on 02/10/22 .  She was also short of breath. She was found to be hypoxic with SPO2 in the 40s on EMS arrival initially.  She was reporting occasional dry cough but no sputum nor fevers, chills. She was placed on BiPAP and brought to the hospital for further evaluation. During work-up she was also noted to be in new onset A-fib with RVR.  For her lung disease she was started on steroids and antibiotics with improvement. She was started on eliquis 5 mg bid for afib and echo obtained. Cardizem drip was transitioned to toprol. It was recommended not to use amiodarone due to underlying lung disease.   Today, she is in SR. She has not noted any further afib. She remains on O2 via nasal cannula at 4L a minute. She is being compliant with eliquis 5 mg bid. She is here with her daughter today.   Today, she denies symptoms of palpitations, chest pain, shortness of breath, orthopnea, PND, lower extremity edema, dizziness, presyncope, syncope, or neurologic sequela. The patient is tolerating medications without difficulties and is otherwise without complaint today.   Past Medical History:  Diagnosis Date   Acute respiratory failure with hypoxia ([place]) 09/30/2018   Breast cancer ([place])    Constipation    COPD exacerbation ([place]) 01/05/2019   Depression with anxiety 09/30/2018   Dyspnea    Essential hypertension 09/30/2018   History of radiation therapy    Right breast- 09/01/21-10/16/21- Dr. Gery Pray   Hyperlipidemia 02/02/2022   Hypertension    Tobacco dependence due to cigarettes 09/30/2018   Past  Surgical History:  Procedure Laterality Date   BREAST LUMPECTOMY WITH RADIOACTIVE SEED AND SENTINEL LYMPH NODE BIOPSY Right 07/23/2021   Procedure: RIGHT BREAST LUMPECTOMY WITH RADIOACTIVE SEED AND SENTINEL LYMPH NODE BIOPSY;  Surgeon: Jovita Kussmaul, MD;  Location: [place];  Service: General;  Laterality: Right;   HAND SURGERY Right 1997   Hand Fracture (right thumb & hand)   LAPAROTOMY  1974   RIGHT/LEFT HEART CATH AND CORONARY ANGIOGRAPHY N/A 10/02/2019   Procedure: RIGHT/LEFT HEART CATH AND CORONARY ANGIOGRAPHY;  Surgeon: Sherren Mocha, MD;  Location: [place] [facility];  Service: Cardiovascular;  Laterality: N/A;   THORACENTESIS N/A 01/12/2022   Procedure: THORACENTESIS;  Surgeon: Juanito Doom, MD;  Location: [place];  Service: Cardiopulmonary;  Laterality: N/A;    Current Outpatient Medications  Medication Sig Dispense Refill   acetaminophen (TYLENOL) 500 MG tablet Take 1,000 mg by mouth daily as needed (pain).     ALPRAZolam (XANAX) 0.5 MG tablet Take 0.5 mg by mouth at bedtime as needed for sleep.     amLODipine (NORVASC) 5 MG tablet Take 1 tablet (5 mg total) by mouth daily. 30 tablet 1   apixaban (ELIQUIS) 5 MG TABS tablet Take 1 tablet (5 mg total) by mouth 2 (two) times daily. 60 tablet 1   Budeson-Glycopyrrol-Formoterol (BREZTRI AEROSPHERE) 160-9-4.8 MCG/ACT AERO Inhale 2 puffs into the lungs in the morning and at bedtime. 32.1 g 1   Cholecalciferol (VITAMIN D3) 25 MCG (1000 UT) CAPS Take 1,000 Units by mouth daily.  exemestane (AROMASIN) 25 MG tablet Take 0.5 tablets (12.5 mg total) by mouth daily after breakfast. 60 tablet 3   fluticasone (FLONASE) 50 MCG/ACT nasal spray Place 2 sprays into both nostrils 2 (two) times daily.     furosemide (LASIX) 40 MG tablet Take 1 tablet (40 mg total) by mouth daily as needed. Take 1 tablet daily x 2 days, then as needed. 20 tablet 0   guaiFENesin (MUCINEX) 600 MG 12 hr tablet Take 600 mg by mouth 2 (two) times daily as needed for  to loosen phlegm.     ipratropium-albuterol (DUONEB) 0.5-2.5 (3) MG/3ML SOLN Use 1 vial via nebulizer 2 times daily for 3 days then 2 times daily as need.(shortness of breath) 360 mL 1   metoprolol succinate (TOPROL-XL) 25 MG 24 hr tablet Take 1 tablet (25 mg total) by mouth daily. 30 tablet 1   predniSONE (DELTASONE) 10 MG tablet Take 4 tablets (40 mg total) by mouth daily with breakfast for 3 days, THEN 3 tablets (30 mg total) daily with breakfast for 3 days, THEN 2 tablets (20 mg total) daily with breakfast for 3 days, THEN 1 tablet (10 mg total) daily with breakfast for 3 days. 30 tablet 0   PROAIR HFA 108 (90 Base) MCG/ACT inhaler Inhale 2 puffs into the lungs every 6 (six) hours as needed for wheezing or shortness of breath. 90 day supply 24 g 3   vitamin B-12 (CYANOCOBALAMIN) 500 MCG tablet Take 500 mcg by mouth daily.     Vitamin E Skin OIL Apply 1 Application topically daily.     pantoprazole (PROTONIX) 40 MG tablet Take 1 tablet (40 mg total) by mouth daily at 6 (six) AM. (Patient not taking: Reported on 03/16/2022) 30 tablet 1   No current facility-administered medications for this encounter.    Allergies  Allergen Reactions   Crab [Shellfish Allergy] Anaphylaxis and Swelling    Dungeness crab   Erythromycin Other (See Comments)    Convulsion, Tolerated Azithro 01/2022   Hydrocodone Nausea And Vomiting    Severe vomiting     Social History   Socioeconomic History   Marital status: Single    Spouse name: Not on file   Number of children: Not on file   Years of education: Not on file   Highest education level: Not on file  Occupational History   Not on file  Tobacco Use   Smoking status: Former    Packs/day: 0.80    Years: 43.00    Total pack years: 34.40    Types: Cigarettes   Smokeless tobacco: Never   Tobacco comments:    Quit smoking 2 - 3 months a go 11/30/21  Vaping Use   Vaping Use: Never used  Substance and Sexual Activity   Alcohol use: Yes    Comment:  gets 6 beers on a weekend and drinks them   Drug use: Not Currently    Types: Marijuana    Comment: tried to smoke some about a month ago   Sexual activity: Not on file  Other Topics Concern   Not on file  Social History Narrative   Not on file   Social Determinants of Health   Financial Resource Strain: Not on file  Food Insecurity: No Food Insecurity (02/03/2022)   Hunger Vital Sign    Worried About Running Out of Food in the Last Year: Never true    Ran Out of Food in the Last Year: Never true  Transportation Needs: No Transportation Needs (  02/03/2022)   PRAPARE - Hydrologist (Medical): No    Lack of Transportation (Non-Medical): No  Physical Activity: Not on file  Stress: Not on file  Social Connections: Not on file  Intimate Partner Violence: Not At Risk (02/03/2022)   Humiliation, Afraid, Rape, and Kick questionnaire    Fear of Current or Ex-Partner: No    Emotionally Abused: No    Physically Abused: No    Sexually Abused: No    Family History  Problem Relation Age of Onset   COPD Mother    Esophageal cancer Maternal Aunt    Esophageal cancer Maternal Grandmother    Prostate cancer Other    Prostate cancer Other    Parkinson's disease Other     ROS- All systems are reviewed and negative except as per the HPI above  Physical Exam: Vitals:   03/16/22 1431  BP: (!) 144/86  Pulse: 91  Weight: 69 kg  Height: '5\' 10"'$  (1.778 m)   Wt Readings from Last 3 Encounters:  03/16/22 69 kg  03/08/22 67.9 kg  02/16/22 65.3 kg    Labs: Lab Results  Component Value Date   NA 139 02/09/2022   K 3.6 02/09/2022   CL 100 02/09/2022   CO2 28 02/09/2022   GLUCOSE 94 02/09/2022   BUN 20 02/09/2022   CREATININE 0.44 02/09/2022   CALCIUM 9.2 02/09/2022   MG 2.0 02/08/2022   Lab Results  Component Value Date   INR 1.1 02/02/2022   Lab Results  Component Value Date   CHOL 141 05/13/2020   HDL 84 05/13/2020   LDLCALC 40 05/13/2020   TRIG  92 05/13/2020     GEN- The patient is well appearing, alert and oriented x 3 today.   Head- normocephalic, atraumatic Eyes-  Sclera clear, conjunctiva pink Ears- hearing intact Oropharynx- clear Neck- supple, no JVP Lymph- no cervical lymphadenopathy Lungs- Clear to ausculation bilaterally,  mild increase  work of breathing Heart- Regular rate and rhythm, no murmurs, rubs or gallops, PMI not laterally displaced GI- soft, NT, ND, + BS Extremities- no clubbing, cyanosis, or edema MS- no significant deformity or atrophy Skin- no rash or lesion Psych- euthymic mood, full affect Neuro- strength and sensation are intact  EKG-Vent. rate 91 BPM PR interval 134 ms QRS duration 94 ms QT/QTcB 358/440 ms P-R-T axes 73 93 76 Sinus rhythm with marked sinus arrhythmia Rightward axis Incomplete right bundle branch block Borderline ECG When compared with ECG of 05-Feb-2022 20:02, PREVIOUS ECG IS PRESENT  Echo- 1. Left ventricular ejection fraction, by estimation, is 60 to 65%. The  left ventricle has normal function. The left ventricle has no regional  wall motion abnormalities. There is mild left ventricular hypertrophy.  Left ventricular diastolic parameters  are consistent with Grade I diastolic dysfunction (impaired relaxation).   2. Right ventricular systolic function is normal. The right ventricular  size is mildly enlarged.   3. No evidence of mitral valve regurgitation.   4. Aortic valve regurgitation is not visualized. Aortic valve  sclerosis/calcification is present, without any evidence of aortic  stenosis.   Comparison(s): No significant change from prior study.   Assessment and Plan:  1. New onset afib in the setting of  COPD/hypoxic respiratory failure In SR today  Does not use excessive caffeine, no alcohol, no longer uses tobacco Continue metoprolol succinate 25 mg qd  2. Chronic respiratory failure Had f/u with pulmonology  9/26 F/u with pulmonary as scheduled  O2 via [place] at 4 L Finishing prednisone taper tomorrow   3. CHA2DS2VASc score of  at least 3 Continue eliquis 5 mg bid Cbc/bmet today   F/u with Dr. Radford Pax requested in 4 weeks   Geroge Baseman. Nemiah Bubar, [facility] [address] [place], [place] [address] [phone]

## 2022-03-24 ENCOUNTER — Telehealth: Payer: Self-pay | Admitting: Pulmonary Disease

## 2022-03-24 MED ORDER — DOXYCYCLINE HYCLATE 100 MG PO TABS: 100.0000 mg | ORAL_TABLET | Freq: Two times a day (BID) | ORAL | 0 refills | Status: DC

## 2022-03-24 MED ORDER — PREDNISONE 10 MG PO TABS: ORAL_TABLET | ORAL | 0 refills | Status: AC

## 2022-03-24 NOTE — Telephone Encounter (Signed)
Spoke with pt who states increased SOB/ dry cough/ wheeze over last couple of days. Pt denies fever/ chills/ GI upset. Pt states she had to use more O2 up to 8 Lpm. Pt's O2 is 90 on 5 Lpm. Pt was instructed to keep O2 sats above 90% at all times and if she could not do that then she would need to go to ED for evaluation. Pt is scheduled for OV with Dr. [name] on 03/31/22. Dr. [name] please advise.

## 2022-03-24 NOTE — Telephone Encounter (Signed)
Spoke with pt and reviewed instructions as dictated by Dr. [name]. Dr. [name] clarified that Doxycycline should BID x 7 days. Medication orders were placed. Pt stated understanding. Nothing further needed at this time.

## 2022-03-26 ENCOUNTER — Telehealth: Payer: Self-pay | Admitting: Pulmonary Disease

## 2022-03-26 NOTE — Telephone Encounter (Signed)
No phone recorded for patient. Informed patient that the last phone call was when she spoke to [place]. Patient verbalized understanding. Nothing further needed

## 2022-03-30 ENCOUNTER — Other Ambulatory Visit: Payer: Self-pay | Admitting: *Deleted

## 2022-03-30 ENCOUNTER — Telehealth: Payer: Self-pay | Admitting: Pulmonary Disease

## 2022-03-30 MED ORDER — PREDNISONE 10 MG PO TABS: ORAL_TABLET | ORAL | 0 refills | Status: DC

## 2022-03-30 NOTE — Telephone Encounter (Signed)
Called and spoke with patient, verified with patient that she only received 20 prednisone tablets for her taper and she should have received 40 tablets.  Advised her that I sent in another script for 20 tablets to finish out the taper.  She verbalized understanding.  She stated that she is starting to feel some better.  Nothing further needed.  Called and spoke with [facility] pharmacy staff member.  They verified that the taper that was sent in only had a quantity of 20 tablets sent in.  Advised I would send in another script for 20 tablets to complete the taper.

## 2022-03-30 NOTE — Telephone Encounter (Signed)
Attempted to contact patient, recording states voicemail has not been set up yet.

## 2022-03-31 ENCOUNTER — Ambulatory Visit: Payer: Medicare Other | Admitting: Pulmonary Disease

## 2022-04-05 ENCOUNTER — Telehealth: Payer: Self-pay | Admitting: Cardiology

## 2022-04-05 ENCOUNTER — Other Ambulatory Visit: Payer: Self-pay

## 2022-04-05 MED ORDER — APIXABAN 5 MG PO TABS: 5.0000 mg | ORAL_TABLET | Freq: Two times a day (BID) | ORAL | 5 refills | Status: DC

## 2022-04-05 MED ORDER — METOPROLOL SUCCINATE ER 25 MG PO TB24: 25.0000 mg | ORAL_TABLET | Freq: Every day | ORAL | 1 refills | Status: DC

## 2022-04-05 NOTE — Telephone Encounter (Signed)
Prescription refill request for Eliquis received. Indication:afib Last office visit:10/23 Scr:0.4 Age: 66 Weight:69 kg  Prescription refilled

## 2022-04-05 NOTE — Telephone Encounter (Signed)
Spoke with pt notified per Discharge summary 02/02/22 pt is to continue metoprolol succinate 25 mg PO QD and Eliquis 5 mg PO BID.  Refilled metoprolol sent Eliquis to anticoag pool.

## 2022-04-05 NOTE — Telephone Encounter (Signed)
Pt c/o medication issue:  1. Name of Medication:   metoprolol succinate (TOPROL-XL) 25 MG 24 hr tablet  apixaban (ELIQUIS) 5 MG TABS tablet   2. How are you currently taking this medication (dosage and times per day)?   As prescribed  3. Are you having a reaction (difficulty breathing--STAT)?   4. What is your medication issue?   Patient stated she had been taking the  metoprolol succinate (TOPROL-XL) 25 MG 24 hr tablet since she was discharged from the hospital and when she tried to refill the medication there was a question about whether she needs to take this dosage.  Patient stated she will need to get these medications refilled to the [place] ([place] [place] - [phone] [place]

## 2022-04-19 ENCOUNTER — Ambulatory Visit: Payer: Medicare Other | Attending: Physician Assistant | Admitting: Physician Assistant

## 2022-04-19 ENCOUNTER — Encounter: Payer: Self-pay | Admitting: Physician Assistant

## 2022-04-19 VITALS — BP 136/76 | HR 89 | Ht 70.0 in | Wt 159.6 lb

## 2022-04-19 DIAGNOSIS — I2510 Atherosclerotic heart disease of native coronary artery without angina pectoris: Secondary | ICD-10-CM | POA: Diagnosis not present

## 2022-04-19 DIAGNOSIS — I4891 Unspecified atrial fibrillation: Secondary | ICD-10-CM | POA: Diagnosis not present

## 2022-04-19 DIAGNOSIS — J449 Chronic obstructive pulmonary disease, unspecified: Secondary | ICD-10-CM

## 2022-04-19 DIAGNOSIS — I25118 Atherosclerotic heart disease of native coronary artery with other forms of angina pectoris: Secondary | ICD-10-CM

## 2022-04-19 DIAGNOSIS — E7800 Pure hypercholesterolemia, unspecified: Secondary | ICD-10-CM

## 2022-04-19 MED ORDER — METOPROLOL SUCCINATE ER 50 MG PO TB24: 50.0000 mg | ORAL_TABLET | Freq: Every day | ORAL | 3 refills | Status: DC

## 2022-04-19 NOTE — Patient Instructions (Signed)
Medication Instructions:  1.Take an extra 1/2 tablet (20 mg) of lasix a few hours after taking the 40 mg tablet for the next 3 days 2.Increase metoprolol succinate to 50 mg daily *If you need a refill on your cardiac medications before your next appointment, please call your pharmacy*   Lab Work: None If you have labs (blood work) drawn today and your tests are completely normal, you will receive your results only by: [place] (if you have MyChart) OR A paper copy in the mail If you have any lab test that is abnormal or we need to change your treatment, we will call you to review the results.   Follow-Up: At [facility], you and your health needs are our priority.  As part of our continuing mission to provide you with exceptional heart care, we have created designated Provider Care Teams.  These Care Teams include your primary Cardiologist (physician) and Advanced Practice Providers (APPs -  Physician Assistants and Nurse Practitioners) who all work together to provide you with the care you need, when you need it.   Your next appointment:   1 month(s)  The format for your next appointment:   In Person  Provider:   [name], MD  or Nicholes Rough, PA-C       Also, schedule a follow up appointment with the afib clinic   Other Instructions 1.Weigh yourself every morning after using the restroom, before breakfast and keep a log 2.Check your blood pressure daily, one hour after taking morning medications, keep a log and let us know if your top number is less that 105  Important Information About Sugar

## 2022-04-19 NOTE — Progress Notes (Signed)
Office Visit    Patient Name: Caroline Greer Date of Encounter: 04/19/2022  PCP:  [name], [facility]  Cardiologist:  [name], MD  Advanced Practice Provider:  No care team member to display Electrophysiologist:  None   HPI    Caroline Greer is a 66 y.o. female with a history of COPD, chronic hypoxic respiratory failure, right breast cancer status postlumpectomy and radiation, depression/anxiety, hypertension, hyperlipidemia, tobacco use, and nonobstructive CAD managed medically) last cath 09/2019) presenting with right-sided chest pain and 2 small ED 02/05/2022 and was admitted, and then discharged on 02/10/2022.  She was shortness of breath.  Found to be hypoxic with SPO2 in the 40s on EMS arrival.  Reportedly having an occasional dry cough but no sputum fever/chills.  She was placed on BiPAP and brought to the hospital for further evaluation.  During work-up she was also noted to be in new onset atrial fibrillation with RVR.    For her lung disease she was started on steroids and antibiotics with improvement.  She was started on Eliquis 5 mg twice daily for atrial fibrillation and echo was obtained.  Cardizem drip was transitioned to metoprolol.  Is recommended not to use amiodarone due to underlying lung disease.  She was seen in the atrial fibrillation clinic 03/16/2022 and at that time she was in normal sinus rhythm.  She had not had any further atrial fibrillation.  She remains on oxygen via nasal cannula, 4 L/min.  She had midline of the spine milligrams twice daily.  She denied symptoms of palpitations, chest pain, shortness of breath, orthopnea, PND, lower extremity edema, dizziness, presyncope, syncope, or neurologic sequelae.  Today she presents for hospital follow-up appointment.  She tells me that since her radiation for breast cancer back in May she has had 1 problem after another.  She states that everything she does she gets short of breath.   She tells me that her chest muscles are sore and over the past 5 days or so she has felt like her shortness of breath is getting worse.  She is on 4 to 6 L nasal cannula.  Oxygen saturation is 93%.  She for his legs since she was switched to the [facility] inhaler she has been in a permanent fog and its affecting her vision.  She also feels like it has not helped with her shortness of breath.  She tells me that she also feels fluid overloaded which is not helping her breathing.  She is also in atrial fibrillation today.  Reports no chest pain, pressure, or tightness. No orthopnea, PND. Reports no palpitations.    Past Medical History    Past Medical History:  Diagnosis Date   Acute respiratory failure with hypoxia ([place]) 09/30/2018   Breast cancer ([place])    Constipation    COPD exacerbation ([place]) 01/05/2019   Depression with anxiety 09/30/2018   Dyspnea    Essential hypertension 09/30/2018   History of radiation therapy    Right breast- 09/01/21-10/16/21- Dr. Gery Pray   Hyperlipidemia 02/02/2022   Hypertension    Tobacco dependence due to cigarettes 09/30/2018   Past Surgical History:  Procedure Laterality Date   BREAST LUMPECTOMY WITH RADIOACTIVE SEED AND SENTINEL LYMPH NODE BIOPSY Right 07/23/2021   Procedure: RIGHT BREAST LUMPECTOMY WITH RADIOACTIVE SEED AND SENTINEL LYMPH NODE BIOPSY;  Surgeon: Jovita Kussmaul, MD;  Location: [place];  Service: General;  Laterality: Right;   HAND SURGERY Right 1997  Hand Fracture (right thumb & hand)   LAPAROTOMY  1974   RIGHT/LEFT HEART CATH AND CORONARY ANGIOGRAPHY N/A 10/02/2019   Procedure: RIGHT/LEFT HEART CATH AND CORONARY ANGIOGRAPHY;  Surgeon: Sherren Mocha, MD;  Location: [place] [facility];  Service: Cardiovascular;  Laterality: N/A;   THORACENTESIS N/A 01/12/2022   Procedure: THORACENTESIS;  Surgeon: Juanito Doom, MD;  Location: [place];  Service: Cardiopulmonary;  Laterality: N/A;    Allergies  Allergies  Allergen  Reactions   Crab [Shellfish Allergy] Anaphylaxis and Swelling    Dungeness crab   Erythromycin Other (See Comments)    Convulsion, Tolerated Azithro 01/2022   Hydrocodone Nausea And Vomiting    Severe vomiting      EKGs/Labs/Other Studies Reviewed:   The following studies were reviewed today:   Echo-  1. Left ventricular ejection fraction, by estimation, is 60 to 65%. The  left ventricle has normal function. The left ventricle has no regional  wall motion abnormalities. There is mild left ventricular hypertrophy.  Left ventricular diastolic parameters  are consistent with Grade I diastolic dysfunction (impaired relaxation).   2. Right ventricular systolic function is normal. The right ventricular  size is mildly enlarged.   3. No evidence of mitral valve regurgitation.   4. Aortic valve regurgitation is not visualized. Aortic valve  sclerosis/calcification is present, without any evidence of aortic  stenosis.   EKG:  EKG is not ordered today.    Recent Labs: 02/02/2022: ALT 13; B Natriuretic Peptide 26.7; TSH 0.243 02/08/2022: Magnesium 2.0 02/09/2022: BUN 20; Creatinine, Ser 0.44; Potassium 3.6; Sodium 139 03/16/2022: Hemoglobin 15.3; Platelets 307  Recent Lipid Panel    Component Value Date/Time   CHOL 141 05/13/2020 1436   TRIG 92 05/13/2020 1436   HDL 84 05/13/2020 1436   CHOLHDL 1.7 05/13/2020 1436   LDLCALC 40 05/13/2020 1436    Risk Assessment/Calculations:   CHA2DS2-VASc Score = 4   This indicates a 4.8% annual risk of stroke. The patient's score is based upon: CHF History: 0 HTN History: 1 Diabetes History: 0 Stroke History: 0 Vascular Disease History: 1 Age Score: 1 Gender Score: 1     Home Medications   Current Meds  Medication Sig   acetaminophen (TYLENOL) 500 MG tablet Take 1,000 mg by mouth daily as needed (pain).   ALPRAZolam (XANAX) 0.5 MG tablet Take 0.5 mg by mouth at bedtime as needed for sleep.   amLODipine (NORVASC) 5 MG tablet Take 1  tablet (5 mg total) by mouth daily.   apixaban (ELIQUIS) 5 MG TABS tablet Take 1 tablet (5 mg total) by mouth 2 (two) times daily.   Budeson-Glycopyrrol-Formoterol (BREZTRI AEROSPHERE) 160-9-4.8 MCG/ACT AERO Inhale 2 puffs into the lungs in the morning and at bedtime.   Cholecalciferol (VITAMIN D3) 25 MCG (1000 UT) CAPS Take 1,000 Units by mouth daily.   exemestane (AROMASIN) 25 MG tablet Take 0.5 tablets (12.5 mg total) by mouth daily after breakfast.   fluticasone (FLONASE) 50 MCG/ACT nasal spray Place 2 sprays into both nostrils 2 (two) times daily.   furosemide (LASIX) 40 MG tablet Take 1 tablet (40 mg total) by mouth daily as needed. Take 1 tablet daily x 2 days, then as needed.   guaiFENesin (MUCINEX) 600 MG 12 hr tablet Take 600 mg by mouth 2 (two) times daily as needed for to loosen phlegm.   HYDROcodone-acetaminophen (NORCO/VICODIN) 5-325 MG tablet Take 1 tablet by mouth every 4 (four) hours as needed.   ipratropium-albuterol (DUONEB) 0.5-2.5 (3) MG/3ML SOLN  Use 1 vial via nebulizer 2 times daily for 3 days then 2 times daily as need.(shortness of breath)   PROAIR HFA 108 (90 Base) MCG/ACT inhaler Inhale 2 puffs into the lungs every 6 (six) hours as needed for wheezing or shortness of breath. 90 day supply   vitamin B-12 (CYANOCOBALAMIN) 500 MCG tablet Take 500 mcg by mouth daily.   Vitamin E Skin OIL Apply 1 Application topically daily.   [DISCONTINUED] metoprolol succinate (TOPROL-XL) 25 MG 24 hr tablet Take 1 tablet (25 mg total) by mouth daily.     Review of Systems      All other systems reviewed and are otherwise negative except as noted above.  Physical Exam    VS:  BP 136/76   Pulse 89   Ht '5\' 10"'$  (1.778 m)   Wt 159 lb 9.6 oz (72.4 kg)   SpO2 93%   BMI 22.90 kg/m  , BMI Body mass index is 22.9 kg/m.  Wt Readings from Last 3 Encounters:  04/19/22 159 lb 9.6 oz (72.4 kg)  03/16/22 152 lb 3.2 oz (69 kg)  03/08/22 149 lb 12.8 oz (67.9 kg)     GEN: Well nourished,  well developed, in no acute distress. HEENT: normal. Neck: Supple, no JVD, carotid bruits, or masses. Cardiac: irregularly irregular, no murmurs, rubs, or gallops. No clubbing, cyanosis,  1+ non pitting bilateral edema.  Radials/PT 2+ and equal bilaterally.  Respiratory:  Respirations regular and unlabored, clear to auscultation bilaterally. GI: Soft, nontender, nondistended. MS: No deformity or atrophy. Skin: Warm and dry, no rash. Neuro:  Strength and sensation are intact. Psych: Normal affect.  Assessment & Plan    New onset atrial fibrillation in the setting of COPD/hypoxic respiratory failure -increase metoprolol to '50mg'$  daily  -keep track of BP  closely and let us know if less than 825 systolic  -will arrange f/u with Afib clinic  Chronic respiratory failure -she is on 4-6L of oxygen as needed -PRN albuterol and inhaled Breztri daily -she has an appointment this week with Pulm -sore intercostal muscles on exam but only on right side (radiation side) -increase lasix to 60 mg daily x 3 days  CHA2DS2-VASc score of at least 3 -continue Eliquis BID  Hypertension -better controlled today, 136/76 -continue current medications  Hyperlipidemia -per primary care  Nonobstructive CAD -no chest pains -continue current medications         Disposition: Follow up 1 month with [name], MD or APP.  Signed, Elgie Collard, PA-C 04/19/2022, 4:22 PM [facility]

## 2022-04-21 ENCOUNTER — Ambulatory Visit: Payer: Medicare Other | Admitting: Emergency Medicine

## 2022-04-21 ENCOUNTER — Telehealth: Payer: Self-pay | Admitting: Pulmonary Disease

## 2022-04-21 NOTE — Telephone Encounter (Signed)
pt came into office expecting to see Dr. Lamonte Sakai today, appt was canceled due to lack of transporation, pt has no knowledge of canceling appt. pt was willing to resch with Roxan Diesel for 12/7 but stated that PA Nicholes Rough from Cardio  sent over notes to Dr.Byrum to review to discuss helping pt with Afib.   Is Dr.Byrum able to pass along any given notes to Roxan Diesel, NP for next OV for pt ?

## 2022-04-21 NOTE — Telephone Encounter (Signed)
Dr. [name], Caroline Greer- patient is unable to come in on 12/4.

## 2022-04-21 NOTE — Telephone Encounter (Signed)
She is Dr. Juline Patch patient so she should not have been scheduled with Dr. Lamonte Sakai. I don't see any notes where she was planning to switch providers either. The cardiology notes are in her chart. They will still be the ones to manage her atrial fibrillation. I am happy to see her on 12/7 if Dr. [name] does not have any availability. Thanks.

## 2022-04-21 NOTE — Telephone Encounter (Signed)
Thank you for seeing her

## 2022-04-26 ENCOUNTER — Ambulatory Visit: Payer: Medicare Other | Admitting: Pulmonary Disease

## 2022-04-29 ENCOUNTER — Encounter: Payer: Self-pay | Admitting: Nurse Practitioner

## 2022-04-29 ENCOUNTER — Telehealth: Payer: Self-pay | Admitting: Nurse Practitioner

## 2022-04-29 ENCOUNTER — Ambulatory Visit (INDEPENDENT_AMBULATORY_CARE_PROVIDER_SITE_OTHER): Payer: Medicare Other

## 2022-04-29 ENCOUNTER — Telehealth: Payer: Self-pay | Admitting: Pulmonary Disease

## 2022-04-29 ENCOUNTER — Ambulatory Visit (INDEPENDENT_AMBULATORY_CARE_PROVIDER_SITE_OTHER): Payer: Medicare Other | Admitting: Nurse Practitioner

## 2022-04-29 VITALS — BP 120/62 | HR 92 | Temp 98.8°F | Ht 70.0 in | Wt 164.2 lb

## 2022-04-29 DIAGNOSIS — J4489 Other specified chronic obstructive pulmonary disease: Secondary | ICD-10-CM

## 2022-04-29 DIAGNOSIS — I4891 Unspecified atrial fibrillation: Secondary | ICD-10-CM

## 2022-04-29 DIAGNOSIS — J441 Chronic obstructive pulmonary disease with (acute) exacerbation: Secondary | ICD-10-CM

## 2022-04-29 DIAGNOSIS — J9621 Acute and chronic respiratory failure with hypoxia: Secondary | ICD-10-CM | POA: Diagnosis not present

## 2022-04-29 DIAGNOSIS — Z79899 Other long term (current) drug therapy: Secondary | ICD-10-CM

## 2022-04-29 MED ORDER — ALBUTEROL SULFATE (2.5 MG/3ML) 0.083% IN NEBU: 2.5000 mg | INHALATION_SOLUTION | Freq: Four times a day (QID) | RESPIRATORY_TRACT | 12 refills | Status: DC | PRN

## 2022-04-29 MED ORDER — BUDESONIDE-FORMOTEROL FUMARATE 160-4.5 MCG/ACT IN AERO: 2.0000 | INHALATION_SPRAY | Freq: Two times a day (BID) | RESPIRATORY_TRACT | 6 refills | Status: DC

## 2022-04-29 MED ORDER — DOXYCYCLINE HYCLATE 100 MG PO TABS: 100.0000 mg | ORAL_TABLET | Freq: Two times a day (BID) | ORAL | 0 refills | Status: DC

## 2022-04-29 MED ORDER — PREDNISONE 10 MG PO TABS: ORAL_TABLET | ORAL | 0 refills | Status: DC

## 2022-04-29 NOTE — Assessment & Plan Note (Signed)
Rate controlled today. Likely secondary to hypoxic respiratory failure. See above plan regarding AECOPD/oxygen management. Follow up with cardiology as scheduled.

## 2022-04-29 NOTE — Progress Notes (Signed)
$'@Patient'W$  ID: Caroline Greer, female    DOB: 1955/07/23, 66 y.o.   MRN: [id number]  No chief complaint on file.   Referring provider: [name], MD  HPI: 66 year old female, former smoker followed for COPD and chronic respiratory failure on supplemental O2.  She is a patient of Dr. Juline Patch and last seen in office 03/08/2022. Past medical history significant for PAF on Eliquis, hypertension, GERD, hypothyroidism, HLD, depression and anxiety.  She has a history of stage Ia right breast cancer status post radiation.  TEST/EVENTS:  02/02/2022 CTA chest: no PE. Reactive lymph nodes, stable. Regressed b/l pleural effusions. Increased atelectasis L>R. Increased diffuse pulmonary septal thickening.  02/02/2022 echo: EF 60-65%; RV function nl, size enlarged. Trivial MR.   03/08/2022: OV with Dr. [name] for follow-up.  Recently did for AECOPD with steroids.  She is also instructed to take increase  her Lasix to once daily.  Clinically looks dry.  Can have back to Lasix every other day as she was taking before.  Needs to complete steroid taper.  Using [employer].  She has stopped smoking.  Has an upcoming follow-up appointment with the atrial fibrillation clinic. Consider daliresp or azithromycin; previously declined in past due to medication side effects.   04/29/2022: Today - acute Patient presents today for acute visit with her daughter. She has been having trouble with her breathing for over a week now. She was seen by cardiology on 11/27 and was back in a fib, believed to be related to her COPD/hypoxic respiratory failure. They increased her metoprolol to 50 mg daily. She also increased her lasix to 60 mg daily for 3 days. She doesn't feel any better today; actually feels like her breathing is worse. She's having trouble keeping her oxygen levels up at home, even on 6 lpm. Occasionally dropping into the 70's. Does recover with rest. She has a cough, which is productive with clear sputum. Feels like she has  trouble getting the mucus up. Thinks the cough and congestion is related to her sinus. She's had more sinus congestion and drainage recently. Denies sick exposures. Did not complete any viral testing at home. She denies fevers, chills, hemoptysis, leg swelling, orthopnea. Eating and drinking well. She is using her Breztri. She wants to change inhalers because she thinks that this has caused her worsening eye problems. Her vision is very blurry and occasionally sees flashes of light. Her left eye hurts at times. She is trying to get in with an eye doctor now.   Allergies  Allergen Reactions   Crab [Shellfish Allergy] Anaphylaxis and Swelling    Dungeness crab   Erythromycin Other (See Comments)    Convulsion, Tolerated Azithro 01/2022   Hydrocodone Nausea And Vomiting    Severe vomiting      There is no immunization history on file for this patient.  Past Medical History:  Diagnosis Date   Acute respiratory failure with hypoxia ([place] [facility]) 09/30/2018   Breast cancer ([place])    Constipation    COPD exacerbation ([place]) 01/05/2019   Depression with anxiety 09/30/2018   Dyspnea    Essential hypertension 09/30/2018   History of radiation therapy    Right breast- 09/01/21-10/16/21- Dr. Gery Pray   Hyperlipidemia 02/02/2022   Hypertension    Tobacco dependence due to cigarettes 09/30/2018    Tobacco History: Social History   Tobacco Use  Smoking Status Former   Packs/day: 0.80   Years: 43.00   Total pack years: 34.40   Types: Cigarettes  Smokeless Tobacco Never  Tobacco Comments   Quit smoking 2 - 3 months a go 11/30/21   Counseling given: Not Answered Tobacco comments: Quit smoking 2 - 3 months a go 11/30/21   Outpatient Medications Prior to Visit  Medication Sig Dispense Refill   acetaminophen (TYLENOL) 500 MG tablet Take 1,000 mg by mouth daily as needed (pain).     ALPRAZolam (XANAX) 0.5 MG tablet Take 0.5 mg by mouth at bedtime as needed for sleep.     amLODipine (NORVASC) 5 MG  tablet Take 1 tablet (5 mg total) by mouth daily. 30 tablet 1   apixaban (ELIQUIS) 5 MG TABS tablet Take 1 tablet (5 mg total) by mouth 2 (two) times daily. 60 tablet 5   Cholecalciferol (VITAMIN D3) 25 MCG (1000 UT) CAPS Take 1,000 Units by mouth daily.     exemestane (AROMASIN) 25 MG tablet Take 0.5 tablets (12.5 mg total) by mouth daily after breakfast. 60 tablet 3   fluticasone (FLONASE) 50 MCG/ACT nasal spray Place 2 sprays into both nostrils 2 (two) times daily.     furosemide (LASIX) 40 MG tablet Take 1 tablet (40 mg total) by mouth daily as needed. Take 1 tablet daily x 2 days, then as needed. 20 tablet 0   guaiFENesin (MUCINEX) 600 MG 12 hr tablet Take 600 mg by mouth 2 (two) times daily as needed for to loosen phlegm.     metoprolol succinate (TOPROL-XL) 50 MG 24 hr tablet Take 1 tablet (50 mg total) by mouth daily. Take with or immediately following a meal. 90 tablet 3   PROAIR HFA 108 (90 Base) MCG/ACT inhaler Inhale 2 puffs into the lungs every 6 (six) hours as needed for wheezing or shortness of breath. 90 day supply 24 g 3   vitamin B-12 (CYANOCOBALAMIN) 500 MCG tablet Take 500 mcg by mouth daily.     Vitamin E Skin OIL Apply 1 Application topically daily.     Budeson-Glycopyrrol-Formoterol (BREZTRI AEROSPHERE) 160-9-4.8 MCG/ACT AERO Inhale 2 puffs into the lungs in the morning and at bedtime. 32.1 g 1   ipratropium-albuterol (DUONEB) 0.5-2.5 (3) MG/3ML SOLN Use 1 vial via nebulizer 2 times daily for 3 days then 2 times daily as need.(shortness of breath) 360 mL 1   predniSONE (DELTASONE) 10 MG tablet Use 20 tabs to finish out previous taper:  3 tablets (30 mg total) daily with breakfast for 4 days, THEN 2 tablets (20 mg total) daily with breakfast for 4 days, THEN 1 tablet (10 mg total) daily with breakfast for 4 days. 20 tablet 0   HYDROcodone-acetaminophen (NORCO/VICODIN) 5-325 MG tablet Take 1 tablet by mouth every 4 (four) hours as needed. (Patient not taking: Reported on 04/29/2022)      pantoprazole (PROTONIX) 40 MG tablet Take 1 tablet (40 mg total) by mouth daily at 6 (six) AM. (Patient not taking: Reported on 03/16/2022) 30 tablet 1   doxycycline (VIBRA-TABS) 100 MG tablet Take 1 tablet (100 mg total) by mouth 2 (two) times daily. (Patient not taking: Reported on 04/19/2022) 14 tablet 0   No facility-administered medications prior to visit.     Review of Systems:   Constitutional: No weight loss or gain, night sweats, fevers, chills. +fatigue, lassitude. HEENT: No headaches, difficulty swallowing, tooth/dental problems, or sore throat. No sneezing, itching, ear ache +nasal congestion, post nasal drip CV:  +palpitations. No chest pain, orthopnea, PND, swelling in lower extremities, anasarca, dizziness, syncope Resp: +shortness of breath with exertion; cough; chest congestion; wheezing. No hemoptysis. No chest wall  deformity GI:  No heartburn, indigestion, abdominal pain, nausea, vomiting, diarrhea, loss of appetite  GU: No dysuria, change in color of urine, urgency or frequency.   Skin: No rash, lesions, ulcerations MSK:  No joint pain or swelling.   Neuro: No dizziness or lightheadedness.  Psych: No depression or anxiety. Mood stable.     Physical Exam:  BP 120/62 (BP Location: Left Arm, Patient Position: Sitting, Cuff Size: Normal)   Pulse 92   Temp 98.8 F (37.1 C) (Oral)   Ht '5\' 10"'$  (1.778 m)   Wt 164 lb 3.2 oz (74.5 kg)   SpO2 91%   BMI 23.56 kg/m   GEN: Pleasant, interactive, chronically-ill appearing; in no acute distress. HEENT:  Normocephalic and atraumatic. PERRLA. Sclera white. Nasal turbinates pink, moist and patent bilaterally. No rhinorrhea present. Oropharynx pink and moist, without exudate or edema. No lesions, ulcerations, or postnasal drip.  NECK:  Supple w/ fair ROM. No JVD present. Normal carotid impulses w/o bruits. Thyroid symmetrical with no goiter or nodules palpated. No lymphadenopathy.   CV: Irregular rhythm, rate controlled, no  m/r/g, no peripheral edema. Pulses intact, +2 bilaterally. No cyanosis, pallor or clubbing. PULMONARY:  Increased work of breathing, regular rate. Diminished bilaterally A&P w/o wheezes/rales/rhonchi. No accessory muscle use.  GI: BS present and normoactive. Soft, non-tender to palpation. No organomegaly or masses detected. MSK: No erythema, warmth or tenderness. Cap refil <2 sec all extrem. No deformities or joint swelling noted.  Neuro: A/Ox3. No focal deficits noted.   Skin: Warm, no lesions or rashe Psych: Normal affect and behavior. Judgement and thought content appropriate.     Lab Results:  CBC    Component Value Date/Time   WBC 14.9 (H) 03/16/2022 1427   RBC 5.11 03/16/2022 1427   HGB 15.3 (H) 03/16/2022 1427   HGB 15.6 (H) 06/03/2021 1234   HGB 17.3 (H) 09/26/2019 1650   HCT 48.2 (H) 03/16/2022 1427   HCT 52.2 (H) 09/26/2019 1650   PLT 307 03/16/2022 1427   PLT 247 06/03/2021 1234   PLT 236 09/26/2019 1650   MCV 94.3 03/16/2022 1427   MCV 88 09/26/2019 1650   MCH 29.9 03/16/2022 1427   MCHC 31.7 03/16/2022 1427   RDW 19.0 (H) 03/16/2022 1427   RDW 19.4 (H) 09/26/2019 1650   LYMPHSABS 1.9 02/08/2022 0320   MONOABS 0.9 02/08/2022 0320   EOSABS 0.2 02/08/2022 0320   BASOSABS 0.0 02/08/2022 0320    BMET    Component Value Date/Time   NA 139 02/09/2022 0955   NA 139 05/13/2020 1436   K 3.6 02/09/2022 0955   CL 100 02/09/2022 0955   CO2 28 02/09/2022 0955   GLUCOSE 94 02/09/2022 0955   BUN 20 02/09/2022 0955   BUN 6 (L) 05/13/2020 1436   CREATININE 0.44 02/09/2022 0955   CREATININE 0.57 06/03/2021 1234   CALCIUM 9.2 02/09/2022 0955   GFRNONAA >60 02/09/2022 0955   GFRNONAA >60 06/03/2021 1234   GFRAA 95 05/13/2020 1436    BNP    Component Value Date/Time   BNP 26.7 02/02/2022 0150     Imaging:  DG Chest 2 View  Result Date: 04/29/2022 CLINICAL DATA:  COPD exacerbation.  Shortness of breath.  Hypoxia. EXAM: CHEST - 2 VIEW COMPARISON:  Chest  radiograph 02/06/2022 FINDINGS: The cardiomediastinal silhouette is unchanged with normal heart size. Aortic atherosclerosis is noted. The lungs are hyperinflated with peribronchial thickening. No focal airspace consolidation, overt pulmonary edema, pleural effusion, or pneumothorax is identified. Right  axillary surgical clips are noted. No acute osseous abnormality is seen. IMPRESSION: Bronchitic changes/COPD.  No evidence of edema or pneumonia. Electronically Signed   By: Logan Bores M.D.   On: 04/29/2022 15:56          No data to display          No results found for: "NITRICOXIDE"      Assessment & Plan:   COPD exacerbation ([place]) AECOPD with acute on chronic respiratory failure. Discussed inpatient vs outpatient treatment. Advised her that I am concerned with her going home given her low oxygen levels. She declined admission to the hospital at this point. Strongly urged her to monitor her O2 levels closely and if unable to maintain sats >88% on 6 lpm, to go to the ED. Verbalized understanding. CXR today without superimposed infection or pulmonary edema. Euvolemic on exam. We will treat her with empiric doxycycline 7 day course, depo 80 mg inj x 1, and prednisone taper. Strict ED precautions.  She has been having vision changes with Breztri. She is also using duonebs. Discussed potentially changing her to albuterol and keep Breztri to eliminate duplicate muscarinic therapy. She feels as though the Judithann Sauger is the contributing factor. We will trial her off of this; although, we did discuss LAMA therapy is important in COPD management and risks of eliminating therapy. Needs evaluation for glaucoma - waiting to see ophthalmologist.   Patient Instructions  Stop Breztri. Start Symbicort 2 puffs Twice daily. Brush tongue and rinse mouth afterwards Continue Albuterol inhaler 2 puffs or 3 mL albuterol neb every 6 hours as needed for shortness of breath or wheezing. Notify if symptoms persist  despite rescue inhaler/neb use. Use your nebulizer treatments at least twice daily until symptoms improve Continue furosemide (lasix) as directed by your heart doctor  Continue flonase nasal spray 2 sprays each nostril daily  Continue supplemental oxygen 4-6 lpm for goal oxygen >88-90%. If you cannot keep your oxygen levels up, you will need to go to the hospital  Restart guaifenesin 600 mg Twice daily for congestion   -Prednisone taper. 4 tabs for 3 days, then 3 tabs for 3 days, 2 tabs for 3 days, then 1 tab for 3 days, then stop. Start tomorrow. Take in AM with food -Doxycycline 1 tab Twice daily for 7 days. Take with food. Wear sunscreen and avoid excessive sun exposure while taking. May increase risk for sunburns and hyperpigmentation   Follow up in 1 week with Dr. [name] (if available) or [name] Taliesin Hartlage,NP. If symptoms do not improve or worsen, please contact office for sooner follow up or seek emergency care.    Acute on chronic respiratory failure with hypoxia ([facility]) Desaturations with walking oximetry to 80's on 6 lpm. Declined hospital admission. She did recover with rest to the low 90's. Goal >88-90%  Atrial fibrillation ([facility]) Rate controlled today. Likely secondary to hypoxic respiratory failure. See above plan regarding AECOPD/oxygen management. Follow up with cardiology as scheduled.    I spent 42 minutes of dedicated to the care of this patient on the date of this encounter to include pre-visit review of records, face-to-face time with the patient discussing conditions above, post visit ordering of testing, clinical documentation with the electronic health record, making appropriate referrals as documented, and communicating necessary findings to members of the patients care team.  Clayton Bibles, NP 04/29/2022  Pt aware and understands NP's role.

## 2022-04-29 NOTE — Assessment & Plan Note (Signed)
Desaturations with walking oximetry to 80's on 6 lpm. Declined hospital admission. She did recover with rest to the low 90's. Goal >88-90%

## 2022-04-29 NOTE — Patient Instructions (Addendum)
Stop Breztri. Start Symbicort 2 puffs Twice daily. Brush tongue and rinse mouth afterwards Continue Albuterol inhaler 2 puffs or 3 mL albuterol neb every 6 hours as needed for shortness of breath or wheezing. Notify if symptoms persist despite rescue inhaler/neb use. Use your nebulizer treatments at least twice daily until symptoms improve Continue furosemide (lasix) as directed by your heart doctor  Continue flonase nasal spray 2 sprays each nostril daily  Continue supplemental oxygen 4-6 lpm for goal oxygen >88-90%. If you cannot keep your oxygen levels up, you will need to go to the hospital  Restart guaifenesin 600 mg Twice daily for congestion   -Prednisone taper. 4 tabs for 3 days, then 3 tabs for 3 days, 2 tabs for 3 days, then 1 tab for 3 days, then stop. Start tomorrow. Take in AM with food -Doxycycline 1 tab Twice daily for 7 days. Take with food. Wear sunscreen and avoid excessive sun exposure while taking. May increase risk for sunburns and hyperpigmentation   Follow up in 1 week with Dr. [name] (if available) or [name] Zyair Rhein,NP. If symptoms do not improve or worsen, please contact office for sooner follow up or seek emergency care.

## 2022-04-29 NOTE — Assessment & Plan Note (Addendum)
AECOPD with acute on chronic respiratory failure. Discussed inpatient vs outpatient treatment. Advised her that I am concerned with her going home given her low oxygen levels. She declined admission to the hospital at this point. Strongly urged her to monitor her O2 levels closely and if unable to maintain sats >88% on 6 lpm, to go to the ED. Verbalized understanding. CXR today without superimposed infection or pulmonary edema. Euvolemic on exam. We will treat her with empiric doxycycline 7 day course, depo 80 mg inj x 1, and prednisone taper. Strict ED precautions.  She has been having vision changes with Breztri. She is also using duonebs. Discussed potentially changing her to albuterol and keep Breztri to eliminate duplicate muscarinic therapy. She feels as though the Judithann Sauger is the contributing factor. We will trial her off of this; although, we did discuss LAMA therapy is important in COPD management and risks of eliminating therapy. Needs evaluation for glaucoma - waiting to see ophthalmologist.   Patient Instructions  Stop Breztri. Start Symbicort 2 puffs Twice daily. Brush tongue and rinse mouth afterwards Continue Albuterol inhaler 2 puffs or 3 mL albuterol neb every 6 hours as needed for shortness of breath or wheezing. Notify if symptoms persist despite rescue inhaler/neb use. Use your nebulizer treatments at least twice daily until symptoms improve Continue furosemide (lasix) as directed by your heart doctor  Continue flonase nasal spray 2 sprays each nostril daily  Continue supplemental oxygen 4-6 lpm for goal oxygen >88-90%. If you cannot keep your oxygen levels up, you will need to go to the hospital  Restart guaifenesin 600 mg Twice daily for congestion   -Prednisone taper. 4 tabs for 3 days, then 3 tabs for 3 days, 2 tabs for 3 days, then 1 tab for 3 days, then stop. Start tomorrow. Take in AM with food -Doxycycline 1 tab Twice daily for 7 days. Take with food. Wear sunscreen and avoid  excessive sun exposure while taking. May increase risk for sunburns and hyperpigmentation   Follow up in 1 week with Dr. [name] (if available) or [name] Loann Chahal,NP. If symptoms do not improve or worsen, please contact office for sooner follow up or seek emergency care.

## 2022-04-29 NOTE — Telephone Encounter (Signed)
While checking out Pt. Insisted on afternoon appt w/[name]. Also, she needs to be seen in a week. Ms. Caroline Greer had nothing I could book her into w/i a week and in the afternoon. I set the first avail  (12/18/9:00 AM) and told her I'd send a message back to triage to call her.

## 2022-04-29 NOTE — Telephone Encounter (Signed)
Noted. Will update [name] in the morning since she is gone for the evening. But I will make sure that appointment is ok.    Caroline Greer is a appointment on 12/18 ok. I know you wanted to see her in 1 week but you are full. And I was not sure if you wanted to double book.  Please advise

## 2022-05-03 NOTE — Telephone Encounter (Signed)
Left message for patient to call back  

## 2022-05-03 NOTE — Telephone Encounter (Signed)
Yes, that's fine. Thanks!

## 2022-05-04 ENCOUNTER — Other Ambulatory Visit: Payer: Self-pay | Admitting: *Deleted

## 2022-05-04 ENCOUNTER — Telehealth: Payer: Self-pay | Admitting: Nurse Practitioner

## 2022-05-04 DIAGNOSIS — J441 Chronic obstructive pulmonary disease with (acute) exacerbation: Secondary | ICD-10-CM

## 2022-05-04 MED ORDER — BUDESONIDE-FORMOTEROL FUMARATE 160-4.5 MCG/ACT IN AERO: 2.0000 | INHALATION_SPRAY | Freq: Two times a day (BID) | RESPIRATORY_TRACT | 3 refills | Status: DC

## 2022-05-04 NOTE — Telephone Encounter (Signed)
According to the script that was sent on 12/7 it looks like the pharmacy is [facility] mail [facility] [phone].Hillery Hunter

## 2022-05-05 NOTE — Telephone Encounter (Signed)
Called [facility] and they will send her Symbicort out starting on 12/28. Called and left [facility] a voicemail due to their office being closed for them to call me back to go over Symbicort inhaler.

## 2022-05-05 NOTE — Telephone Encounter (Signed)
Called and spoke with pt letting her know that the Rx for Symbicort will be shipped by [facility] 12/28. Pt said she is needing an Rx from [place] as she is out of her Symbicort. She said that [facility] needed more info from office prior to being able to fill Rx.  Called [facility] about pt's Symbicort and they stated that they needed to have the quantity clarified as it was showing 1 each. They needed to know exact amount and I stated to them that it was supposed to be for 1 inhaler. They verbalized understanding and stated they would get Rx ready for pt. Nothing further needed.

## 2022-05-05 NOTE — Telephone Encounter (Signed)
Melanie from [facility] is returning phone call, Threasa Beards phone number is [phone].

## 2022-05-10 ENCOUNTER — Ambulatory Visit: Payer: Medicare Other | Admitting: Nurse Practitioner

## 2022-05-19 ENCOUNTER — Emergency Department (HOSPITAL_COMMUNITY): Payer: Medicare Other

## 2022-05-19 ENCOUNTER — Inpatient Hospital Stay (HOSPITAL_COMMUNITY): Payer: Medicare Other

## 2022-05-19 ENCOUNTER — Inpatient Hospital Stay (HOSPITAL_COMMUNITY)
Admission: EM | Admit: 2022-05-19 | Discharge: 2022-05-24 | DRG: 291 | Disposition: A | Discharge status: Home / Self Care | Payer: Medicare Other | Attending: Internal Medicine | Admitting: Internal Medicine

## 2022-05-19 DIAGNOSIS — I10 Essential (primary) hypertension: Secondary | ICD-10-CM | POA: Diagnosis not present

## 2022-05-19 DIAGNOSIS — Z881 Allergy status to other antibiotic agents status: Secondary | ICD-10-CM | POA: Diagnosis not present

## 2022-05-19 DIAGNOSIS — Z853 Personal history of malignant neoplasm of breast: Secondary | ICD-10-CM

## 2022-05-19 DIAGNOSIS — F418 Other specified anxiety disorders: Secondary | ICD-10-CM | POA: Diagnosis present

## 2022-05-19 DIAGNOSIS — I480 Paroxysmal atrial fibrillation: Secondary | ICD-10-CM | POA: Diagnosis present

## 2022-05-19 DIAGNOSIS — Z800 Family history of malignant neoplasm of digestive organs: Secondary | ICD-10-CM

## 2022-05-19 DIAGNOSIS — Z923 Personal history of irradiation: Secondary | ICD-10-CM | POA: Diagnosis not present

## 2022-05-19 DIAGNOSIS — J90 Pleural effusion, not elsewhere classified: Secondary | ICD-10-CM | POA: Diagnosis not present

## 2022-05-19 DIAGNOSIS — J441 Chronic obstructive pulmonary disease with (acute) exacerbation: Principal | ICD-10-CM | POA: Diagnosis present

## 2022-05-19 DIAGNOSIS — Z825 Family history of asthma and other chronic lower respiratory diseases: Secondary | ICD-10-CM | POA: Diagnosis not present

## 2022-05-19 DIAGNOSIS — K5900 Constipation, unspecified: Secondary | ICD-10-CM | POA: Diagnosis present

## 2022-05-19 DIAGNOSIS — J9621 Acute and chronic respiratory failure with hypoxia: Secondary | ICD-10-CM | POA: Diagnosis present

## 2022-05-19 DIAGNOSIS — Z1152 Encounter for screening for COVID-19: Secondary | ICD-10-CM

## 2022-05-19 DIAGNOSIS — I2510 Atherosclerotic heart disease of native coronary artery without angina pectoris: Secondary | ICD-10-CM | POA: Diagnosis present

## 2022-05-19 DIAGNOSIS — Z87891 Personal history of nicotine dependence: Secondary | ICD-10-CM

## 2022-05-19 DIAGNOSIS — J9601 Acute respiratory failure with hypoxia: Secondary | ICD-10-CM | POA: Diagnosis not present

## 2022-05-19 DIAGNOSIS — Z820 Family history of epilepsy and other diseases of the nervous system: Secondary | ICD-10-CM | POA: Diagnosis not present

## 2022-05-19 DIAGNOSIS — Z7901 Long term (current) use of anticoagulants: Secondary | ICD-10-CM

## 2022-05-19 DIAGNOSIS — I5033 Acute on chronic diastolic (congestive) heart failure: Secondary | ICD-10-CM | POA: Diagnosis present

## 2022-05-19 DIAGNOSIS — K219 Gastro-esophageal reflux disease without esophagitis: Secondary | ICD-10-CM | POA: Diagnosis present

## 2022-05-19 DIAGNOSIS — J9622 Acute and chronic respiratory failure with hypercapnia: Secondary | ICD-10-CM | POA: Diagnosis present

## 2022-05-19 DIAGNOSIS — F32A Depression, unspecified: Secondary | ICD-10-CM | POA: Diagnosis present

## 2022-05-19 DIAGNOSIS — Z885 Allergy status to narcotic agent status: Secondary | ICD-10-CM

## 2022-05-19 DIAGNOSIS — F419 Anxiety disorder, unspecified: Secondary | ICD-10-CM | POA: Diagnosis present

## 2022-05-19 DIAGNOSIS — E0590 Thyrotoxicosis, unspecified without thyrotoxic crisis or storm: Secondary | ICD-10-CM | POA: Diagnosis present

## 2022-05-19 DIAGNOSIS — E0781 Sick-euthyroid syndrome: Secondary | ICD-10-CM | POA: Diagnosis not present

## 2022-05-19 DIAGNOSIS — Z91013 Allergy to seafood: Secondary | ICD-10-CM

## 2022-05-19 DIAGNOSIS — I4891 Unspecified atrial fibrillation: Secondary | ICD-10-CM | POA: Diagnosis not present

## 2022-05-19 DIAGNOSIS — E785 Hyperlipidemia, unspecified: Secondary | ICD-10-CM | POA: Diagnosis present

## 2022-05-19 DIAGNOSIS — I110 Hypertensive heart disease with heart failure: Principal | ICD-10-CM | POA: Diagnosis present

## 2022-05-19 DIAGNOSIS — Z9981 Dependence on supplemental oxygen: Secondary | ICD-10-CM | POA: Diagnosis not present

## 2022-05-19 DIAGNOSIS — D72829 Elevated white blood cell count, unspecified: Secondary | ICD-10-CM | POA: Diagnosis present

## 2022-05-19 DIAGNOSIS — Z79899 Other long term (current) drug therapy: Secondary | ICD-10-CM

## 2022-05-19 DIAGNOSIS — Z7951 Long term (current) use of inhaled steroids: Secondary | ICD-10-CM

## 2022-05-19 LAB — CBC
HCT: 41.6 % (ref 36.0–46.0)
Hemoglobin: 13.2 g/dL (ref 12.0–15.0)
MCH: 32.1 pg (ref 26.0–34.0)
MCHC: 31.7 g/dL (ref 30.0–36.0)
MCV: 101.2 fL — ABNORMAL HIGH (ref 80.0–100.0)
Platelets: 192 10*3/uL (ref 150–400)
RBC: 4.11 MIL/uL (ref 3.87–5.11)
RDW: 14 % (ref 11.5–15.5)
WBC: 14.6 10*3/uL — ABNORMAL HIGH (ref 4.0–10.5)
nRBC: 0 % (ref 0.0–0.2)

## 2022-05-19 LAB — RESPIRATORY PANEL BY PCR

## 2022-05-19 LAB — COMPREHENSIVE METABOLIC PANEL
ALT: 16 U/L (ref 0–44)
AST: 15 U/L (ref 15–41)
Albumin: 3.3 g/dL — ABNORMAL LOW (ref 3.5–5.0)
Alkaline Phosphatase: 40 U/L (ref 38–126)
Anion gap: 7 (ref 5–15)
BUN: 8 mg/dL (ref 8–23)
CO2: 39 mmol/L — ABNORMAL HIGH (ref 22–32)
Calcium: 8.9 mg/dL (ref 8.9–10.3)
Chloride: 94 mmol/L — ABNORMAL LOW (ref 98–111)
Creatinine, Ser: 0.49 mg/dL (ref 0.44–1.00)
GFR, Estimated: >60 mL/min (ref >60)
Glucose, Bld: 115 mg/dL — ABNORMAL HIGH (ref 70–99)
Potassium: 4 mmol/L (ref 3.5–5.1)
Sodium: 140 mmol/L (ref 135–145)
Total Bilirubin: 1.1 mg/dL (ref 0.3–1.2)
Total Protein: 6.5 g/dL (ref 6.5–8.1)

## 2022-05-19 LAB — BRAIN NATRIURETIC PEPTIDE: B Natriuretic Peptide: 27.2 pg/mL (ref 0.0–100.0)

## 2022-05-19 LAB — I-STAT VENOUS BLOOD GAS, ED
Acid-Base Excess: 14 mmol/L — ABNORMAL HIGH (ref 0.0–2.0)
Bicarbonate: 40.6 mmol/L — ABNORMAL HIGH (ref 20.0–28.0)
Calcium, Ion: 1.08 mmol/L — ABNORMAL LOW (ref 1.15–1.40)
HCT: 42 % (ref 36.0–46.0)
Hemoglobin: 14.3 g/dL (ref 12.0–15.0)
O2 Saturation: 93 %
Potassium: 4 mmol/L (ref 3.5–5.1)
Sodium: 138 mmol/L (ref 135–145)
TCO2: 42 mmol/L — ABNORMAL HIGH (ref 22–32)
pCO2, Ven: 56.2 mmHg (ref 44–60)
pH, Ven: 7.467 — ABNORMAL HIGH (ref 7.25–7.43)
pO2, Ven: 64 mmHg — ABNORMAL HIGH (ref 32–45)

## 2022-05-19 LAB — RESP PANEL BY RT-PCR (RSV, FLU A&B, COVID)  RVPGX2
Influenza A by PCR: NEGATIVE
Influenza B by PCR: NEGATIVE
Resp Syncytial Virus by PCR: NEGATIVE
SARS Coronavirus 2 by RT PCR: NEGATIVE

## 2022-05-19 MED ORDER — ALBUTEROL SULFATE (2.5 MG/3ML) 0.083% IN NEBU
2.5000 mg | INHALATION_SOLUTION | RESPIRATORY_TRACT | Status: DC | PRN
  Administered 2022-05-21: 2.5 mg via RESPIRATORY_TRACT
  Filled 2022-05-19: qty 3

## 2022-05-19 MED ORDER — POLYETHYLENE GLYCOL 3350 17 G PO PACK
17.0000 g | PACK | Freq: Every day | ORAL | Status: DC | PRN
  Administered 2022-05-21: 17 g via ORAL
  Filled 2022-05-19: qty 1

## 2022-05-19 MED ORDER — METHYLPREDNISOLONE SODIUM SUCC 125 MG IJ SOLR
125.0000 mg | Freq: Once | INTRAMUSCULAR | Status: AC
  Administered 2022-05-19: 125 mg via INTRAVENOUS
  Filled 2022-05-19: qty 2

## 2022-05-19 MED ORDER — ALPRAZOLAM 0.5 MG PO TABS
0.5000 mg | ORAL_TABLET | Freq: Every evening | ORAL | Status: DC | PRN
  Administered 2022-05-20 – 2022-05-22 (×2): 0.5 mg via ORAL
  Filled 2022-05-19 (×2): qty 1

## 2022-05-19 MED ORDER — IOHEXOL 350 MG/ML SOLN
75.0000 mL | Freq: Once | INTRAVENOUS | Status: AC | PRN
  Administered 2022-05-19: 75 mL via INTRAVENOUS

## 2022-05-19 MED ORDER — MOMETASONE FURO-FORMOTEROL FUM 200-5 MCG/ACT IN AERO
2.0000 | INHALATION_SPRAY | Freq: Two times a day (BID) | RESPIRATORY_TRACT | Status: DC
  Filled 2022-05-19 (×2): qty 8.8

## 2022-05-19 MED ORDER — IPRATROPIUM BROMIDE 0.02 % IN SOLN
0.5000 mg | Freq: Four times a day (QID) | RESPIRATORY_TRACT | Status: DC
  Administered 2022-05-19 – 2022-05-21 (×7): 0.5 mg via RESPIRATORY_TRACT
  Filled 2022-05-19 (×8): qty 2.5

## 2022-05-19 MED ORDER — METOPROLOL SUCCINATE ER 50 MG PO TB24
50.0000 mg | ORAL_TABLET | Freq: Every day | ORAL | Status: DC
  Administered 2022-05-20 – 2022-05-24 (×5): 50 mg via ORAL
  Filled 2022-05-19 (×3): qty 1
  Filled 2022-05-19: qty 2
  Filled 2022-05-19: qty 1

## 2022-05-19 MED ORDER — AMLODIPINE BESYLATE 5 MG PO TABS
5.0000 mg | ORAL_TABLET | Freq: Every day | ORAL | Status: DC
  Administered 2022-05-20 – 2022-05-22 (×3): 5 mg via ORAL
  Filled 2022-05-19 (×3): qty 1

## 2022-05-19 MED ORDER — ACETAMINOPHEN 650 MG RE SUPP: 650.0000 mg | Freq: Four times a day (QID) | RECTAL | Status: DC | PRN

## 2022-05-19 MED ORDER — APIXABAN 5 MG PO TABS
5.0000 mg | ORAL_TABLET | Freq: Two times a day (BID) | ORAL | Status: DC
  Administered 2022-05-20 – 2022-05-24 (×10): 5 mg via ORAL
  Filled 2022-05-19 (×10): qty 1

## 2022-05-19 MED ORDER — FUROSEMIDE 10 MG/ML IJ SOLN
40.0000 mg | Freq: Every day | INTRAMUSCULAR | Status: DC
  Administered 2022-05-19 – 2022-05-21 (×3): 40 mg via INTRAVENOUS
  Filled 2022-05-19 (×3): qty 4

## 2022-05-19 MED ORDER — EXEMESTANE 25 MG PO TABS
12.5000 mg | ORAL_TABLET | Freq: Every day | ORAL | Status: DC
  Administered 2022-05-20 – 2022-05-24 (×5): 12.5 mg via ORAL
  Filled 2022-05-19 (×5): qty 1

## 2022-05-19 MED ORDER — ACETAMINOPHEN 325 MG PO TABS
650.0000 mg | ORAL_TABLET | Freq: Four times a day (QID) | ORAL | Status: DC | PRN
  Administered 2022-05-20 – 2022-05-24 (×4): 650 mg via ORAL
  Filled 2022-05-19 (×4): qty 2

## 2022-05-19 MED ORDER — PREDNISONE 20 MG PO TABS
40.0000 mg | ORAL_TABLET | Freq: Every day | ORAL | Status: DC
  Administered 2022-05-20 – 2022-05-22 (×3): 40 mg via ORAL
  Filled 2022-05-19 (×3): qty 2

## 2022-05-19 MED ORDER — ALBUTEROL SULFATE (2.5 MG/3ML) 0.083% IN NEBU
5.0000 mg | INHALATION_SOLUTION | Freq: Once | RESPIRATORY_TRACT | Status: AC
  Administered 2022-05-19: 5 mg via RESPIRATORY_TRACT
  Filled 2022-05-19: qty 6

## 2022-05-19 MED ORDER — SODIUM CHLORIDE 0.9% FLUSH
3.0000 mL | Freq: Two times a day (BID) | INTRAVENOUS | Status: DC
  Administered 2022-05-20 – 2022-05-24 (×9): 3 mL via INTRAVENOUS

## 2022-05-19 NOTE — Consult Note (Signed)
NAME:  Caroline Greer, MRN:  [id number], DOB:  May 20, 1956, LOS: 0 ADMISSION DATE:  05/19/2022, CONSULTATION DATE:  05/19/22 REFERRING MD:  Jeanell Sparrow, CHIEF COMPLAINT:  SOB   History of Present Illness:  66 year old woman with hx of COPD on chronic oxygen followed in our office who presents with rather sudden onset chest pressure and SOB unrelieved by home albuterol.  Some associated leg swelling.  No cough, fever, chills, sick contacts.  Has history of broken sternum which she thinks pain is from.   Also hx R breast Ca initially diagnosed 04/2021 (stage 1A, HER2 negative), had R lumpectomy and treated with radiation therapy completed 09/2021 and now on Exemestane.  Recent admit Sept for pleurisy found to have effusions. No good cause for her persistent hypoxemia found but did improve with diuresis.  Pertinent  Medical History   Past Medical History:  Diagnosis Date   Acute respiratory failure with hypoxia ([place]) 09/30/2018   Breast cancer ([place])    Constipation    COPD exacerbation ([place]) 01/05/2019   Depression with anxiety 09/30/2018   Dyspnea    Essential hypertension 09/30/2018   History of radiation therapy    Right breast- 09/01/21-10/16/21- Dr. Gery Pray   Hyperlipidemia 02/02/2022   Hypertension    Tobacco dependence due to cigarettes 09/30/2018     Significant Hospital Events: Including procedures, antibiotic start and stop dates in addition to other pertinent events   12/27 admit  Interim History / Subjective:  Consult  Objective   Blood pressure 115/68, pulse 97, resp. rate 19, SpO2 99 %.    FiO2 (%):  [50 %] 50 %  No intake or output data in the 24 hours ending 05/19/22 1635 There were no vitals filed for this visit.  Examination: General: loquacious woman in NAD HENT: MMM, trachea midline Lungs: Diminished, no accessory muscle use Cardiovascular: RRR, ext arm Abdomen: Soft, +BS Extremities: no edema Neuro: moves all 4 ext to command  BNP normal but it was last  visit too  Resolved Hospital Problem list   N/A  Assessment & Plan:  Acute on chronic hypoxemic respiratory failure- acting like a shunt with clear CXR, profound O2 need Chronic hypercarbic respiratory failure ?why on eliquis- states compliance Hx of breast cancer post surgery and chemoradiation  - Trial of HHFNC - CTA Chest r/o PE - echo/bubble - wean O2 for sats > 90% - start lasix 78m daily - Will follow  Best Practice (right click and "Reselect all SmartList Selections" daily)  Per primary  Labs   CBC: Recent Labs  Lab 05/19/22 1202 05/19/22 1214  WBC 14.6*  --   HGB 13.2 14.3  HCT 41.6 42.0  MCV 101.2*  --   PLT 192  --     Basic Metabolic Panel: Recent Labs  Lab 05/19/22 1202 05/19/22 1214  NA 140 138  K 4.0 4.0  CL 94*  --   CO2 39*  --   GLUCOSE 115*  --   BUN 8  --   CREATININE 0.49  --   CALCIUM 8.9  --    GFR: CrCl cannot be calculated (Unknown ideal weight.). Recent Labs  Lab 05/19/22 1202  WBC 14.6*    Liver Function Tests: Recent Labs  Lab 05/19/22 1202  AST 15  ALT 16  ALKPHOS 40  BILITOT 1.1  PROT 6.5  ALBUMIN 3.3*   No results for input(s): "LIPASE", "AMYLASE" in the last 168 hours. No results for input(s): "AMMONIA" in the last 168  hours.  ABG    Component Value Date/Time   PHART 7.327 (L) 10/02/2019 0832   PCO2ART 60.1 (H) 10/02/2019 0832   PO2ART 103 10/02/2019 0832   HCO3 40.6 (H) 05/19/2022 1214   TCO2 42 (H) 05/19/2022 1214   O2SAT 93 05/19/2022 1214     Coagulation Profile: No results for input(s): "INR", "PROTIME" in the last 168 hours.  Cardiac Enzymes: No results for input(s): "CKTOTAL", "CKMB", "CKMBINDEX", "TROPONINI" in the last 168 hours.  HbA1C: No results found for: "HGBA1C"  CBG: No results for input(s): "GLUCAP" in the last 168 hours.  Review of Systems:    Positive Symptoms in bold:  Constitutional fevers, chills, weight loss, fatigue, anorexia, malaise  Eyes decreased vision, double  vision, eye irritation  Ears, Nose, Mouth, Throat sore throat, trouble swallowing, sinus congestion  Cardiovascular chest pain, paroxysmal nocturnal dyspnea, lower ext edema, palpitations   Respiratory SOB, cough, DOE, hemoptysis, wheezing  Gastrointestinal nausea, vomiting, diarrhea  Genitourinary burning with urination, trouble urinating  Musculoskeletal joint aches, joint swelling, back pain  Integumentary  rashes, skin lesions  Neurological focal weakness, focal numbness, trouble speaking, headaches  Psychiatric depression, anxiety, confusion  Endocrine polyuria, polydipsia, cold intolerance, heat intolerance  Hematologic abnormal bruising, abnormal bleeding, unexplained nose bleeds  Allergic/Immunologic recurrent infections, hives, swollen lymph nodes     Past Medical History:  She,  has a past medical history of Acute respiratory failure with hypoxia ([place]) (09/30/2018), Breast cancer ([place]), Constipation, COPD exacerbation ([place]) (01/05/2019), Depression with anxiety (09/30/2018), Dyspnea, Essential hypertension (09/30/2018), History of radiation therapy, Hyperlipidemia (02/02/2022), Hypertension, and Tobacco dependence due to cigarettes (09/30/2018).   Surgical History:   Past Surgical History:  Procedure Laterality Date   BREAST LUMPECTOMY WITH RADIOACTIVE SEED AND SENTINEL LYMPH NODE BIOPSY Right 07/23/2021   Procedure: RIGHT BREAST LUMPECTOMY WITH RADIOACTIVE SEED AND SENTINEL LYMPH NODE BIOPSY;  Surgeon: Jovita Kussmaul, MD;  Location: [facility];  Service: General;  Laterality: Right;   HAND SURGERY Right 1997   Hand Fracture (right thumb & hand)   LAPAROTOMY  1974   RIGHT/LEFT HEART CATH AND CORONARY ANGIOGRAPHY N/A 10/02/2019   Procedure: RIGHT/LEFT HEART CATH AND CORONARY ANGIOGRAPHY;  Surgeon: Sherren Mocha, MD;  Location: [facility];  Service: Cardiovascular;  Laterality: N/A;   THORACENTESIS N/A 01/12/2022   Procedure: THORACENTESIS;  Surgeon: Juanito Doom, MD;   Location: [facility];  Service: Cardiopulmonary;  Laterality: N/A;     Social History:   reports that she has quit smoking. Her smoking use included cigarettes. She has a 34.40 pack-year smoking history. She has never used smokeless tobacco. She reports current alcohol use. She reports that she does not currently use drugs after having used the following drugs: Marijuana.   Family History:  Her family history includes COPD in her mother; Esophageal cancer in her maternal aunt and maternal grandmother; Parkinson's disease in an other family member; Prostate cancer in some other family members.   Allergies Allergies  Allergen Reactions   Crab [Shellfish Allergy] Anaphylaxis and Swelling    Dungeness crab   Erythromycin Other (See Comments)    Convulsion, Tolerated Azithro 01/2022   Hydrocodone Nausea And Vomiting    Severe vomiting      Home Medications  Prior to Admission medications   Medication Sig Start Date End Date Taking? Authorizing Provider  MAGNESIUM-OXIDE 400 (240 Mg) MG tablet Take 1 tablet by mouth daily. 04/29/22  Yes [provider]  acetaminophen (TYLENOL) 500 MG tablet Take 1,000 mg  by mouth daily as needed (pain).    [provider]  albuterol (PROVENTIL) (2.5 MG/3ML) 0.083% nebulizer solution Take 3 mLs (2.5 mg total) by nebulization every 6 (six) hours as needed for wheezing or shortness of breath. 04/29/22   Cobb, Karie Schwalbe, NP  ALPRAZolam Duanne Moron) 0.5 MG tablet Take 0.5 mg by mouth at bedtime as needed for sleep. 10/31/18   [provider]  amLODipine (NORVASC) 5 MG tablet Take 1 tablet (5 mg total) by mouth daily. 02/11/22   Hosie Poisson, MD  apixaban (ELIQUIS) 5 MG TABS tablet Take 1 tablet (5 mg total) by mouth 2 (two) times daily. 04/05/22   Sueanne Margarita, MD  budesonide-formoterol (SYMBICORT) 160-4.5 MCG/ACT inhaler Inhale 2 puffs into the lungs 2 (two) times daily. 05/04/22   Garner Nash, DO  Cholecalciferol (VITAMIN D3) 25 MCG  (1000 UT) CAPS Take 1,000 Units by mouth daily.    [provider]  doxycycline (VIBRA-TABS) 100 MG tablet Take 1 tablet (100 mg total) by mouth 2 (two) times daily. 04/29/22   Cobb, Karie Schwalbe, NP  exemestane (AROMASIN) 25 MG tablet Take 0.5 tablets (12.5 mg total) by mouth daily after breakfast. 12/21/21   Nicholas Lose, MD  fluticasone (FLONASE) 50 MCG/ACT nasal spray Place 2 sprays into both nostrils 2 (two) times daily.    [provider]  furosemide (LASIX) 40 MG tablet Take 1 tablet (40 mg total) by mouth daily as needed. Take 1 tablet daily x 2 days, then as needed. Patient taking differently: Take 40 mg by mouth daily as needed for fluid. Take 1 tablet daily x 2 days, then as needed. 02/10/22   Hosie Poisson, MD  guaiFENesin (MUCINEX) 600 MG 12 hr tablet Take 600 mg by mouth 2 (two) times daily as needed for to loosen phlegm.    [provider]  HYDROcodone-acetaminophen (NORCO/VICODIN) 5-325 MG tablet Take 1 tablet by mouth every 4 (four) hours as needed. Patient not taking: Reported on 04/29/2022 03/26/22   [provider]  metoprolol succinate (TOPROL-XL) 50 MG 24 hr tablet Take 1 tablet (50 mg total) by mouth daily. Take with or immediately following a meal. 04/19/22   Elgie Collard, PA-C  pantoprazole (PROTONIX) 40 MG tablet Take 1 tablet (40 mg total) by mouth daily at 6 (six) AM. Patient not taking: Reported on 03/16/2022 12/16/21   Eugenie Filler, MD  predniSONE (DELTASONE) 10 MG tablet 4 tabs for 3 days, then 3 tabs for 3 days, 2 tabs for 3 days, then 1 tab for 3 days, then stop Patient taking differently: Take 10 mg by mouth See admin instructions. 4 tabs for 3 days, then 3 tabs for 3 days, 2 tabs for 3 days, then 1 tab for 3 days, then stop 04/29/22   Cobb, Karie Schwalbe, NP  PROAIR HFA 108 (90 Base) MCG/ACT inhaler Inhale 2 puffs into the lungs every 6 (six) hours as needed for wheezing or shortness of breath. 90 day supply 01/11/22   Icard, Leory Plowman  L, DO  vitamin B-12 (CYANOCOBALAMIN) 500 MCG tablet Take 500 mcg by mouth daily.    [provider]  Vitamin E Skin OIL Apply 1 Application topically daily.    [provider]     Critical care time: N/A

## 2022-05-19 NOTE — ED Provider Notes (Signed)
[facility] EMERGENCY DEPARTMENT Provider Note   CSN: [id number] Arrival date & time: 05/19/22  1129     History  Chief Complaint  Patient presents with   Respiratory Distress    Caroline Greer is a 66 y.o. female.  HPI 66 year old female history of COPD, chronic respiratory failure, on home oxygen at 4 to 5 L/min, A-fib with chronic anticoagulation presents today with increased dyspnea.  She reports that she awoke around 2 AM and had increased shortness of breath from baseline.  She did not note any cough, fever, or chills.  She does state that she got very warm for a little while but was able to fan herself off and cool down.  She is continue to feel short of breath.  EMS reports when they arrived her oxygen saturation are 75%.  She did increase her oxygen to 6 L/min.  They placed her on CPAP prior to transport.     Home Medications Prior to Admission medications   Medication Sig Start Date End Date Taking? Authorizing Provider  MAGNESIUM-OXIDE 400 (240 Mg) MG tablet Take 1 tablet by mouth daily. 04/29/22  Yes [provider]  acetaminophen (TYLENOL) 500 MG tablet Take 1,000 mg by mouth daily as needed (pain).    [provider]  albuterol (PROVENTIL) (2.5 MG/3ML) 0.083% nebulizer solution Take 3 mLs (2.5 mg total) by nebulization every 6 (six) hours as needed for wheezing or shortness of breath. 04/29/22   Cobb, Karie Schwalbe, NP  ALPRAZolam Duanne Moron) 0.5 MG tablet Take 0.5 mg by mouth at bedtime as needed for sleep. 10/31/18   [provider]  amLODipine (NORVASC) 5 MG tablet Take 1 tablet (5 mg total) by mouth daily. 02/11/22   Hosie Poisson, MD  apixaban (ELIQUIS) 5 MG TABS tablet Take 1 tablet (5 mg total) by mouth 2 (two) times daily. 04/05/22   Sueanne Margarita, MD  budesonide-formoterol (SYMBICORT) 160-4.5 MCG/ACT inhaler Inhale 2 puffs into the lungs 2 (two) times daily. 05/04/22   Garner Nash, DO  Cholecalciferol (VITAMIN D3) 25 MCG  (1000 UT) CAPS Take 1,000 Units by mouth daily.    [provider]  doxycycline (VIBRA-TABS) 100 MG tablet Take 1 tablet (100 mg total) by mouth 2 (two) times daily. 04/29/22   Cobb, Karie Schwalbe, NP  exemestane (AROMASIN) 25 MG tablet Take 0.5 tablets (12.5 mg total) by mouth daily after breakfast. 12/21/21   Nicholas Lose, MD  fluticasone (FLONASE) 50 MCG/ACT nasal spray Place 2 sprays into both nostrils 2 (two) times daily.    [provider]  furosemide (LASIX) 40 MG tablet Take 1 tablet (40 mg total) by mouth daily as needed. Take 1 tablet daily x 2 days, then as needed. Patient taking differently: Take 40 mg by mouth daily as needed for fluid. Take 1 tablet daily x 2 days, then as needed. 02/10/22   Hosie Poisson, MD  guaiFENesin (MUCINEX) 600 MG 12 hr tablet Take 600 mg by mouth 2 (two) times daily as needed for to loosen phlegm.    [provider]  HYDROcodone-acetaminophen (NORCO/VICODIN) 5-325 MG tablet Take 1 tablet by mouth every 4 (four) hours as needed. Patient not taking: Reported on 04/29/2022 03/26/22   [provider]  metoprolol succinate (TOPROL-XL) 50 MG 24 hr tablet Take 1 tablet (50 mg total) by mouth daily. Take with or immediately following a meal. 04/19/22   Elgie Collard, PA-C  pantoprazole (PROTONIX) 40 MG tablet Take 1 tablet (40 mg total)  by mouth daily at 6 (six) AM. Patient not taking: Reported on 03/16/2022 12/16/21   Eugenie Filler, MD  predniSONE (DELTASONE) 10 MG tablet 4 tabs for 3 days, then 3 tabs for 3 days, 2 tabs for 3 days, then 1 tab for 3 days, then stop Patient taking differently: Take 10 mg by mouth See admin instructions. 4 tabs for 3 days, then 3 tabs for 3 days, 2 tabs for 3 days, then 1 tab for 3 days, then stop 04/29/22   Cobb, Karie Schwalbe, NP  PROAIR HFA 108 (90 Base) MCG/ACT inhaler Inhale 2 puffs into the lungs every 6 (six) hours as needed for wheezing or shortness of breath. 90 day supply 01/11/22   Icard, Leory Plowman  L, DO  vitamin B-12 (CYANOCOBALAMIN) 500 MCG tablet Take 500 mcg by mouth daily.    [provider]  Vitamin E Skin OIL Apply 1 Application topically daily.    [provider]      Allergies    Otho Darner allergy], Erythromycin, and Hydrocodone    Review of Systems   Review of Systems  Physical Exam Updated Vital Signs BP 115/68   Pulse 97   Resp 19   SpO2 99%  Physical Exam Vitals and nursing note reviewed.  Constitutional:      General: She is not in acute distress.    Appearance: Normal appearance. She is not ill-appearing.     Comments: Patient is speaking to me in full sentence on CPAP  HENT:     Head: Normocephalic.     Right Ear: External ear normal.     Left Ear: External ear normal.     Nose: Nose normal.     Mouth/Throat:     Pharynx: Oropharynx is clear.  Eyes:     Pupils: Pupils are equal, round, and reactive to light.  Cardiovascular:     Rate and Rhythm: Regular rhythm. Tachycardia present.     Pulses: Normal pulses.  Pulmonary:     Effort: Respiratory distress present.     Comments: Decreased breath sounds throughout with inspiratory and expiratory wheezes Abdominal:     General: Bowel sounds are normal.     Palpations: Abdomen is soft.  Musculoskeletal:        General: No swelling or tenderness. Normal range of motion.     Cervical back: Normal range of motion.     Right lower leg: No edema.     Left lower leg: No edema.  Skin:    General: Skin is warm and dry.     Capillary Refill: Capillary refill takes less than 2 seconds.     Findings: No rash.  Neurological:     General: No focal deficit present.     Mental Status: She is alert.  Psychiatric:        Mood and Affect: Mood normal.     ED Results / Procedures / Treatments   Labs (all labs ordered are listed, but only abnormal results are displayed) Labs Reviewed  CBC - Abnormal; Notable for the following components:      Result Value   WBC 14.6 (*)    MCV 101.2  (*)    All other components within normal limits  COMPREHENSIVE METABOLIC PANEL - Abnormal; Notable for the following components:   Chloride 94 (*)    CO2 39 (*)    Glucose, Bld 115 (*)    Albumin 3.3 (*)    All other components within normal limits  I-STAT  VENOUS BLOOD GAS, ED - Abnormal; Notable for the following components:   pH, Ven 7.467 (*)    pO2, Ven 64 (*)    Bicarbonate 40.6 (*)    TCO2 42 (*)    Acid-Base Excess 14.0 (*)    Calcium, Ion 1.08 (*)    All other components within normal limits  RESP PANEL BY RT-PCR (RSV, FLU A&B, COVID)  RVPGX2  RESPIRATORY PANEL BY PCR  BRAIN NATRIURETIC PEPTIDE  BLOOD GAS, VENOUS  PROCALCITONIN    EKG None  Radiology DG Chest Port 1 View  Result Date: 05/19/2022 CLINICAL DATA:  Shortness of breath. Respiratory distress; new onset. EXAM: PORTABLE CHEST 1 VIEW COMPARISON:  Radiographs 04/29/2022 and 02/06/2022.  CT 02/02/2022. FINDINGS: 1237 hours. The heart size and mediastinal contours are stable. The lungs are hyperinflated but clear. No pneumothorax or significant pleural effusion demonstrated. There are surgical clips in the right axilla. No acute osseous findings are evident. Telemetry leads overlie the chest. IMPRESSION: Chronic obstructive pulmonary disease. No acute cardiopulmonary process. Electronically Signed   By: Richardean Sale M.D.   On: 05/19/2022 12:51    Procedures .Critical Care  Performed by: Pattricia Boss, MD Authorized by: Pattricia Boss, MD   Critical care provider statement:    Critical care time (minutes):  60   Critical care was necessary to treat or prevent imminent or life-threatening deterioration of the following conditions:  Respiratory failure   Critical care was time spent personally by me on the following activities:  Development of treatment plan with patient or surrogate, discussions with consultants, evaluation of patient's response to treatment, examination of patient, ordering and review of  laboratory studies, ordering and review of radiographic studies, ordering and performing treatments and interventions, pulse oximetry, re-evaluation of patient's condition and review of old charts     Medications Ordered in ED Medications  exemestane (AROMASIN) tablet 12.5 mg (has no administration in time range)  metoprolol succinate (TOPROL-XL) 24 hr tablet 50 mg (has no administration in time range)  amLODipine (NORVASC) tablet 5 mg (has no administration in time range)  ALPRAZolam (XANAX) tablet 0.5 mg (has no administration in time range)  apixaban (ELIQUIS) tablet 5 mg (has no administration in time range)  mometasone-formoterol (DULERA) 200-5 MCG/ACT inhaler 2 puff (has no administration in time range)  predniSONE (DELTASONE) tablet 40 mg (has no administration in time range)  sodium chloride flush (NS) 0.9 % injection 3 mL (has no administration in time range)  acetaminophen (TYLENOL) tablet 650 mg (has no administration in time range)    Or  acetaminophen (TYLENOL) suppository 650 mg (has no administration in time range)  polyethylene glycol (MIRALAX / GLYCOLAX) packet 17 g (has no administration in time range)  ipratropium (ATROVENT) nebulizer solution 0.5 mg (has no administration in time range)  albuterol (PROVENTIL) (2.5 MG/3ML) 0.083% nebulizer solution 2.5 mg (has no administration in time range)  albuterol (PROVENTIL) (2.5 MG/3ML) 0.083% nebulizer solution 5 mg (5 mg Nebulization Given 05/19/22 1202)  methylPREDNISolone sodium succinate (SOLU-MEDROL) 125 mg/2 mL injection 125 mg (125 mg Intravenous Given 05/19/22 1202)    ED Course/ Medical Decision Making/ A&P Clinical Course as of 05/19/22 1634  Wed May 19, 2022  1413 Chest x-Beaulah Romanek reviewed interpreted without acute abnormalities noted radiologist interpretation concurs [DR]  1416 CBC reviewed and interpreted significant for leukocytosis with white blood cell count 14,600 Hemoglobin normal at 13 Platelets normal 190,000  [DR]  1610 Complete metabolic panel reviewed interpreted significant for mild hyperglycemia with glucose of  115 and hypochloremia with chloride of 94 [DR]    Clinical Course User Index [DR] Pattricia Boss, MD                           Medical Decision Making 66 year old female history of COPD presents today with increased dyspnea onset this morning.  Patient is having somewhat increased coughing.  Sats have been decreased into the 80s.  She has had to increase her home oxygen up to 6 L/min.  She has had diffuse wheezing.  She has been treated prehospital he with CPAP.  She reported using her home albuterol.  She has been on prednisone this month is not currently on prednisone. Differential diagnosis includes but is not limited to COPD exacerbation, pneumonia, viral syndrome including COVID, PE, cardiac etiology Patient evaluated with chest x-Betty Brooks which is clear Patient has a mild leukocytosis with white blood cell count of 14,000 Bipap d/c'd and high flow nasal cannula in place with o2 sats 89% Patient continues on high flow nasal cannula oxygen saturations are now 91 to 93%.  Patient continues to speak in full sentences and is asking for something to eat. Discussed with Dr. [place] Bells and with Dr. Trilby Drummer.  Dr. Trilby Drummer will admit for hospitalist service  Amount and/or Complexity of Data Reviewed External Data Reviewed: labs and notes. Labs: ordered. Decision-making details documented in ED Course. Radiology: ordered and independent interpretation performed. Decision-making details documented in ED Course. ECG/medicine tests: ordered and independent interpretation performed. Decision-making details documented in ED Course. Discussion of management or test interpretation with external provider(s): Dr. [place] Bells critical care Dr. Trilby Drummer hospitalist  Risk Prescription drug management.         Final Clinical Impression(s) / ED Diagnoses Final diagnoses:  COPD exacerbation ([place])  Acute  respiratory failure with hypoxia [facility])    Rx / DC Orders ED Discharge Orders     None         Pattricia Boss, MD 05/19/22 [phone]

## 2022-05-19 NOTE — H&P (Signed)
History and Physical   Caroline Greer ONG:[id number] DOB: 1955/05/29 DOA: 05/19/2022  PCP: [name], MD   Patient coming from: Home  Chief Complaint: Shortness of breath  HPI: Caroline Greer is a 66 y.o. female with medical history significant of subclinical hyperthyroidism , Bilateral pleural effusions, breast cancer on exemestane, hyperlipidemia, hypertension, GERD, depression, anxiety, atrial fibrillation, COPD, chronic respiratory failure on 3 L supplemental oxygen presenting with shortness of breath.  Appreciate reports acute worsening of her chronic shortness of breath.  She woke up around 2 AM with the symptoms.  Does report feeling briefly warm but no measured fever.  She called EMS and per report on their arrival she was saturating 75% they increased her oxygen to 6 L and then when she was in transport placed her on CPAP.  She has been on a prednisone taper this month but not on it currently.  She denies fevers, chills, chest pain, abdominal pain, constipation, diarrhea, nausea, vomiting.  ED Course: Vital signs in the ED notable for respiratory rate in the 20s and initially on BiPAP and then weaned to high flow nasal cannula requiring greater than 15 L to maintain saturations in the upper 80s to low 90s.  Lab workup included CMP with chloride 94, bicarb 39, glucose 115, albumin 3.3.  CBC with leukocytosis to 14.6.  BMP normal.  Rester panel for flu COVID and RSV negative.  Chest x-ray showing evidence of COPD but no acute abnormalities.  CTA PE study ordered by pulmonology and is pending.  Patient received Solu-Medrol albuterol in the ED.  As above patient case was discussed with pulmonology and it.  They are following for now.  Review of Systems: As per HPI otherwise all other systems reviewed and are negative.  Past Medical History:  Diagnosis Date   Acute respiratory failure with hypoxia ([place]) 09/30/2018   Breast cancer ([place])    Constipation    COPD exacerbation ([place])  01/05/2019   Depression with anxiety 09/30/2018   Dyspnea    Essential hypertension 09/30/2018   History of radiation therapy    Right breast- 09/01/21-10/16/21- Dr. Gery Pray   Hyperlipidemia 02/02/2022   Hypertension    Tobacco dependence due to cigarettes 09/30/2018    Past Surgical History:  Procedure Laterality Date   BREAST LUMPECTOMY WITH RADIOACTIVE SEED AND SENTINEL LYMPH NODE BIOPSY Right 07/23/2021   Procedure: RIGHT BREAST LUMPECTOMY WITH RADIOACTIVE SEED AND SENTINEL LYMPH NODE BIOPSY;  Surgeon: Jovita Kussmaul, MD;  Location: [facility];  Service: General;  Laterality: Right;   HAND SURGERY Right 1997   Hand Fracture (right thumb & hand)   LAPAROTOMY  1974   RIGHT/LEFT HEART CATH AND CORONARY ANGIOGRAPHY N/A 10/02/2019   Procedure: RIGHT/LEFT HEART CATH AND CORONARY ANGIOGRAPHY;  Surgeon: Sherren Mocha, MD;  Location: [facility];  Service: Cardiovascular;  Laterality: N/A;   THORACENTESIS N/A 01/12/2022   Procedure: THORACENTESIS;  Surgeon: Juanito Doom, MD;  Location: [facility];  Service: Cardiopulmonary;  Laterality: N/A;    Social History  reports that she has quit smoking. Her smoking use included cigarettes. She has a 34.40 pack-year smoking history. She has never used smokeless tobacco. She reports current alcohol use. She reports that she does not currently use drugs after having used the following drugs: Marijuana.  Allergies  Allergen Reactions   Crab [Shellfish Allergy] Anaphylaxis and Swelling    Dungeness crab   Erythromycin Other (See Comments)    Convulsion, Tolerated Azithro 01/2022   Hydrocodone Nausea And  Vomiting    Severe vomiting     Family History  Problem Relation Age of Onset   COPD Mother    Esophageal cancer Maternal Aunt    Esophageal cancer Maternal Grandmother    Prostate cancer Other    Prostate cancer Other    Parkinson's disease Other   Reviewed on admission  Prior to Admission medications   Medication Sig Start  Date End Date Taking? Authorizing Provider  MAGNESIUM-OXIDE 400 (240 Mg) MG tablet Take 1 tablet by mouth daily. 04/29/22  Yes [provider]  acetaminophen (TYLENOL) 500 MG tablet Take 1,000 mg by mouth daily as needed (pain).    [provider]  albuterol (PROVENTIL) (2.5 MG/3ML) 0.083% nebulizer solution Take 3 mLs (2.5 mg total) by nebulization every 6 (six) hours as needed for wheezing or shortness of breath. 04/29/22   Cobb, Karie Schwalbe, NP  ALPRAZolam Duanne Moron) 0.5 MG tablet Take 0.5 mg by mouth at bedtime as needed for sleep. 10/31/18   [provider]  amLODipine (NORVASC) 5 MG tablet Take 1 tablet (5 mg total) by mouth daily. 02/11/22   Hosie Poisson, MD  apixaban (ELIQUIS) 5 MG TABS tablet Take 1 tablet (5 mg total) by mouth 2 (two) times daily. 04/05/22   Sueanne Margarita, MD  budesonide-formoterol (SYMBICORT) 160-4.5 MCG/ACT inhaler Inhale 2 puffs into the lungs 2 (two) times daily. 05/04/22   Garner Nash, DO  Cholecalciferol (VITAMIN D3) 25 MCG (1000 UT) CAPS Take 1,000 Units by mouth daily.    [provider]  doxycycline (VIBRA-TABS) 100 MG tablet Take 1 tablet (100 mg total) by mouth 2 (two) times daily. 04/29/22   Cobb, Karie Schwalbe, NP  exemestane (AROMASIN) 25 MG tablet Take 0.5 tablets (12.5 mg total) by mouth daily after breakfast. 12/21/21   Nicholas Lose, MD  fluticasone (FLONASE) 50 MCG/ACT nasal spray Place 2 sprays into both nostrils 2 (two) times daily.    [provider]  furosemide (LASIX) 40 MG tablet Take 1 tablet (40 mg total) by mouth daily as needed. Take 1 tablet daily x 2 days, then as needed. Patient taking differently: Take 40 mg by mouth daily as needed for fluid. Take 1 tablet daily x 2 days, then as needed. 02/10/22   Hosie Poisson, MD  guaiFENesin (MUCINEX) 600 MG 12 hr tablet Take 600 mg by mouth 2 (two) times daily as needed for to loosen phlegm.    [provider]  HYDROcodone-acetaminophen (NORCO/VICODIN)  5-325 MG tablet Take 1 tablet by mouth every 4 (four) hours as needed. Patient not taking: Reported on 04/29/2022 03/26/22   [provider]  metoprolol succinate (TOPROL-XL) 50 MG 24 hr tablet Take 1 tablet (50 mg total) by mouth daily. Take with or immediately following a meal. 04/19/22   Elgie Collard, PA-C  pantoprazole (PROTONIX) 40 MG tablet Take 1 tablet (40 mg total) by mouth daily at 6 (six) AM. Patient not taking: Reported on 03/16/2022 12/16/21   Eugenie Filler, MD  predniSONE (DELTASONE) 10 MG tablet 4 tabs for 3 days, then 3 tabs for 3 days, 2 tabs for 3 days, then 1 tab for 3 days, then stop Patient taking differently: Take 10 mg by mouth See admin instructions. 4 tabs for 3 days, then 3 tabs for 3 days, 2 tabs for 3 days, then 1 tab for 3 days, then stop 04/29/22   Cobb, Karie Schwalbe, NP  PROAIR HFA 108 (90 Base) MCG/ACT inhaler Inhale 2 puffs into the  lungs every 6 (six) hours as needed for wheezing or shortness of breath. 90 day supply 01/11/22   Icard, Leory Plowman L, DO  vitamin B-12 (CYANOCOBALAMIN) 500 MCG tablet Take 500 mcg by mouth daily.    [provider]  Vitamin E Skin OIL Apply 1 Application topically daily.    [provider]    Physical Exam: Vitals:   05/19/22 1315 05/19/22 1400 05/19/22 1515 05/19/22 1545  BP: 116/71 107/66 115/76 115/68  Pulse: 89 84 89 97  Resp: (!) 24 (!) '22 19 19  '$ SpO2: 90% (!) 89% 100% 99%    Physical Exam Constitutional:      General: She is not in acute distress.    Appearance: Normal appearance.  HENT:     Head: Normocephalic and atraumatic.     Mouth/Throat:     Mouth: Mucous membranes are moist.     Pharynx: Oropharynx is clear.  Eyes:     Extraocular Movements: Extraocular movements intact.     Pupils: Pupils are equal, round, and reactive to light.  Cardiovascular:     Rate and Rhythm: Normal rate and regular rhythm.     Pulses: Normal pulses.     Heart sounds: Normal heart sounds.  Pulmonary:      Effort: Pulmonary effort is normal. Tachypnea present. No respiratory distress.     Breath sounds: Decreased breath sounds present.  Abdominal:     General: Bowel sounds are normal. There is no distension.     Palpations: Abdomen is soft.     Tenderness: There is no abdominal tenderness.  Musculoskeletal:        General: No swelling or deformity.  Skin:    General: Skin is warm and dry.  Neurological:     General: No focal deficit present.     Mental Status: Mental status is at baseline.    Labs on Admission: I have personally reviewed following labs and imaging studies  CBC: Recent Labs  Lab 05/19/22 1202 05/19/22 1214  WBC 14.6*  --   HGB 13.2 14.3  HCT 41.6 42.0  MCV 101.2*  --   PLT 192  --     Basic Metabolic Panel: Recent Labs  Lab 05/19/22 1202 05/19/22 1214  NA 140 138  K 4.0 4.0  CL 94*  --   CO2 39*  --   GLUCOSE 115*  --   BUN 8  --   CREATININE 0.49  --   CALCIUM 8.9  --     GFR: CrCl cannot be calculated (Unknown ideal weight.).  Liver Function Tests: Recent Labs  Lab 05/19/22 1202  AST 15  ALT 16  ALKPHOS 40  BILITOT 1.1  PROT 6.5  ALBUMIN 3.3*    Urine analysis:    Component Value Date/Time   COLORURINE YELLOW 02/02/2022 1430   APPEARANCEUR HAZY (A) 02/02/2022 1430   LABSPEC <1.005 (L) 02/02/2022 1430   PHURINE 5.0 02/02/2022 1430   GLUCOSEU NEGATIVE 02/02/2022 1430   HGBUR SMALL (A) 02/02/2022 1430   BILIRUBINUR NEGATIVE 02/02/2022 1430   KETONESUR NEGATIVE 02/02/2022 1430   PROTEINUR 30 (A) 02/02/2022 1430   UROBILINOGEN 0.2 12/24/2007 1833   NITRITE NEGATIVE 02/02/2022 1430   LEUKOCYTESUR LARGE (A) 02/02/2022 1430    Radiological Exams on Admission: DG Chest Port 1 View  Result Date: 05/19/2022 CLINICAL DATA:  Shortness of breath. Respiratory distress; new onset. EXAM: PORTABLE CHEST 1 VIEW COMPARISON:  Radiographs 04/29/2022 and 02/06/2022.  CT 02/02/2022. FINDINGS: 1237 hours. The heart size  and mediastinal contours  are stable. The lungs are hyperinflated but clear. No pneumothorax or significant pleural effusion demonstrated. There are surgical clips in the right axilla. No acute osseous findings are evident. Telemetry leads overlie the chest. IMPRESSION: Chronic obstructive pulmonary disease. No acute cardiopulmonary process. Electronically Signed   By: Richardean Sale M.D.   On: 05/19/2022 12:51    EKG: Independently reviewed.  Sinus rhythm at 88 bpm.  Nonspecific T wave flattening.  PVCs noted.  Assessment/Plan Active Problems:   COPD exacerbation (HCC)   Essential hypertension   Depression with anxiety   Atrial fibrillation ([facility]CC)   GERD (gastroesophageal reflux disease)   Hyperlipidemia   Acute on chronic respiratory failure with hypoxia (HCC)   Acute on chronic respiratory failure with hypoxia ?COPD exacerbation History of pleural effusions Rule out pulmonary embolus > Patient presenting with acute worsening in her shortness of breath around 2 AM this morning.  Known history of COPD on chronic 3 L. > Placed on CPAP for EMS and then BiPAP in the ED.  Transition off BiPAP to high flow nasal cannula but was requiring greater than 15 L to maintain saturations in the upper 80s to low 90s. > Has additionally received Solu-Medrol and albuterol in the ED. > EDP discussed case with pulmonology who are following based on patient's treatment team. > Will check RVP to evaluate for viral etiology of possible COPD exacerbation, is negative for flu, RSV, COVID thus far.  Does have leukocytosis however this could be reactive.  Will also check procalcitonin to delineate between viral versus less likely bacterial etiology.  > Unclear etiology of respiratory failure otherwise.  Does have history of pleural effusions but these were not noted on chest x-ray.  Feel PE is less likely given she is on Eliquis. Normal BNP. Will follow-up results of CTA PE study ordered, hopefully this will provide more clarity. - Appreciate  pulmonology recommendations - Monitor on progressive unit - As needed BiPAP - Continue with high flow nasal cannula (currently 70%, 30-35L), transition to BiPAP as needed, wean as tolerated - Hold off on diet until patient has had CTA done - Follow-up CTA PE study - Check RVP  - Procalcitonin - Trend fever curve and WBC - Scheduled prednisone - Scheduled Atrovent - As needed albuterol - Replacement Symbicort formulary [place]  Atrial fibrillation - Continue home metoprolol and Eliquis  Hypertension - Continue home amlodipine, metoprolol  Breast cancer > S/P lumpectomy and radiation - Continue home exemestane  DVT prophylaxis: Eliquis Code Status:   Full, discussed with patient Family Communication:  None on admission, patient states family aware of admission   Disposition Plan:   Patient is from:  Home  Anticipated DC to:  Home  Anticipated DC date:  2 to 4 days  Anticipated DC barriers: None  Consults called:  Pulmonology Admission status:  Inpatient, progressive  Severity of Illness: The appropriate patient status for this patient is INPATIENT. Inpatient status is judged to be reasonable and necessary in order to provide the required intensity of service to ensure the patient's safety. The patient's presenting symptoms, physical exam findings, and initial radiographic and laboratory data in the context of their chronic comorbidities is felt to place them at high risk for further clinical deterioration. Furthermore, it is not anticipated that the patient will be medically stable for discharge from the hospital within 2 midnights of admission.   * I certify that at the point of admission it is my clinical judgment that the patient  will require inpatient hospital care spanning beyond 2 midnights from the point of admission due to high intensity of service, high risk for further deterioration and high frequency of surveillance required.Marcelyn Bruins MD [facility]  How to contact the [facility] Attending or Consulting provider [place] or covering provider during after hours [place], for this patient?   Check the care team in [facility] and look for a) attending/consulting [facility] provider listed and b) the [facility] team listed Log into [email or web address] and use [facility]'s universal password to access. If you do not have the password, please contact the hospital operator. Locate the [facility] provider you are looking for under [facility] and page to a number that you can be directly reached. If you still have difficulty reaching the provider, please page the [facility] (Director on Call) for the Hospitalists listed on amion for assistance.  05/19/2022, 4:34 PM

## 2022-05-19 NOTE — ED Triage Notes (Signed)
Pt BIB [facility] from home after calling for SOB, used albuterol with no relief. pt 60% 6LNC on EMS arrival, currently 75% on CPAP. A&Ox4, hx of asthma. [place]

## 2022-05-20 ENCOUNTER — Inpatient Hospital Stay (HOSPITAL_COMMUNITY): Payer: Medicare Other

## 2022-05-20 DIAGNOSIS — J9601 Acute respiratory failure with hypoxia: Secondary | ICD-10-CM | POA: Diagnosis not present

## 2022-05-20 DIAGNOSIS — J9621 Acute and chronic respiratory failure with hypoxia: Secondary | ICD-10-CM | POA: Diagnosis not present

## 2022-05-20 DIAGNOSIS — I4891 Unspecified atrial fibrillation: Secondary | ICD-10-CM | POA: Diagnosis not present

## 2022-05-20 DIAGNOSIS — J441 Chronic obstructive pulmonary disease with (acute) exacerbation: Secondary | ICD-10-CM

## 2022-05-20 DIAGNOSIS — I10 Essential (primary) hypertension: Secondary | ICD-10-CM | POA: Diagnosis not present

## 2022-05-20 LAB — CBC
HCT: 39.6 % (ref 36.0–46.0)
Hemoglobin: 12.2 g/dL (ref 12.0–15.0)
MCH: 31 pg (ref 26.0–34.0)
MCHC: 30.8 g/dL (ref 30.0–36.0)
MCV: 100.8 fL — ABNORMAL HIGH (ref 80.0–100.0)
Platelets: 193 10*3/uL (ref 150–400)
RBC: 3.93 MIL/uL (ref 3.87–5.11)
RDW: 13.8 % (ref 11.5–15.5)
WBC: 10.4 10*3/uL (ref 4.0–10.5)
nRBC: 0 % (ref 0.0–0.2)

## 2022-05-20 LAB — COMPREHENSIVE METABOLIC PANEL
ALT: 15 U/L (ref 0–44)
AST: 14 U/L — ABNORMAL LOW (ref 15–41)
Albumin: 3.1 g/dL — ABNORMAL LOW (ref 3.5–5.0)
Alkaline Phosphatase: 39 U/L (ref 38–126)
Anion gap: 8 (ref 5–15)
BUN: 16 mg/dL (ref 8–23)
CO2: 37 mmol/L — ABNORMAL HIGH (ref 22–32)
Calcium: 8.9 mg/dL (ref 8.9–10.3)
Chloride: 93 mmol/L — ABNORMAL LOW (ref 98–111)
Creatinine, Ser: 0.7 mg/dL (ref 0.44–1.00)
GFR, Estimated: >60 mL/min (ref >60)
Glucose, Bld: 143 mg/dL — ABNORMAL HIGH (ref 70–99)
Potassium: 4 mmol/L (ref 3.5–5.1)
Sodium: 138 mmol/L (ref 135–145)
Total Bilirubin: 0.7 mg/dL (ref 0.3–1.2)
Total Protein: 6.3 g/dL — ABNORMAL LOW (ref 6.5–8.1)

## 2022-05-20 LAB — PROCALCITONIN: Procalcitonin: 0.16 ng/mL

## 2022-05-20 NOTE — ED Notes (Signed)
Pt got up to use bedside commode. Sats dropped in to 70s on high flow. Once she got back in bed after about 5 mins, sats returned to baseline.

## 2022-05-20 NOTE — Progress Notes (Signed)
PROGRESS NOTE        PATIENT DETAILS Name: Caroline Greer Age: 66 y.o. Sex: female Date of Birth: 11/20/1955 Admit Date: 05/19/2022 Admitting Physician Marcelyn Bruins, MD [id number], [name], MD  Brief Summary: Patient is a 66 y.o.  female with history of COPD on home O2 (4 L at rest-6 L with activity), A-fib on Eliquis-presented to the hospital with worsening shortness of breath x 1 day-found to have acute on chronic hypoxic respiratory failure-initially requiring BiPAP but subsequently transition to HFNC.  Etiology of hypoxia unclear at this time.  See below for further details.  Significant events: 12/27>> presented with shortness of breath-severe hypoxemia-initially on BiPAP but then transition to HFNC.  Admit to [facility].  Significant studies: 12/27>> CTA chest: No PE, trace bilateral effusions, dependent bibasilar atelectasis.  Significant microbiology data: 12/27>> respiratory virus panel: Negative 12/27>> COVID/influenza/RSV: Negative  Procedures: None  Consults: PCCM  Subjective: Appears comfortable-feels somewhat better but still on HHFNC  Objective: Vitals: Blood pressure 123/79, pulse 85, temperature 98.7 F (37.1 C), temperature source Oral, resp. rate 20, SpO2 91 %.   Exam: Gen Exam:Alert awake-not in any distress HEENT:atraumatic, normocephalic Chest: B/L clear to auscultation anteriorly CVS:S1S2 regular Abdomen:soft non tender, non distended Extremities:no edema Neurology: Non focal Skin: no rash  Pertinent Labs/Radiology:    Latest Ref Rng & Units 05/20/2022    5:00 AM 05/19/2022   12:14 PM 05/19/2022   12:02 PM  CBC  WBC 4.0 - 10.5 K/uL 10.4   14.6   Hemoglobin 12.0 - 15.0 g/dL 12.2  14.3  13.2   Hematocrit 36.0 - 46.0 % 39.6  42.0  41.6   Platelets 150 - 400 K/uL 193   192     Lab Results  Component Value Date   NA 138 05/20/2022   K 4.0 05/20/2022   CL 93 (L) 05/20/2022   CO2 37 (H) 05/20/2022       Assessment/Plan: Acute on chronic hypoxic respiratory failure Etiology unclear-no PE on CTA chest, lung parenchyma appears to be clear-minimal pleural effusions cannot explain this degree of hypoxemia.  No fluid overload on exam.  No wheezing suggestive of COPD exacerbation.  Awaiting echocardiogram with bubble study to rule out shunt physiology.  In interim-continue IV Lasix-maintain negative balance, taper steroids-and continue bronchodilators.  PCCM following.  COPD No wheezing-does not appear to have an acute exacerbation Given severity of hypoxemia-being treated with bronchodilators and a course of tapering steroids to see if this helps improve her hypoxemia.  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  PAF Sinus rhythm Continue Eliquis/metoprolol  HTN BP stable Continue amlodipine/metoprolol  History of stage Ia right breast cancer-s/p lumpectomy (07/23/2021) and adjuvant radiation therapy (4/11-5/26/2023)-and antiestrogen therapy with exemestane CT chest without any evidence of radiation fibrosis to explain hypoxemia Continue outpatient follow-up with primary oncologist-Dr. [name].   BMI: Estimated body mass index is 23.56 kg/m as calculated from the following:   Height as of 04/29/22: '5\' 10"'$  (1.778 m).   Weight as of 04/29/22: 74.5 kg.   Code status:   Code Status: Full Code   DVT Prophylaxis: apixaban (ELIQUIS) tablet 5 mg    Family Communication: None at bedside  Disposition Plan: Status is: Inpatient Remains inpatient appropriate because: Severity of hypoxemia requiring [place]   Planned Discharge Destination:Home health  Diet: Diet Order             Diet regular Room service appropriate? Yes; Fluid consistency: Thin  Diet effective now                     Antimicrobial agents: Anti-infectives (From admission, onward)    None         MEDICATIONS: Scheduled Meds:  amLODipine  5 mg Oral Daily   apixaban  5 mg Oral BID   exemestane  12.5 mg Oral QPC breakfast   furosemide  40 mg Intravenous Daily   ipratropium  0.5 mg Nebulization Q6H   metoprolol succinate  50 mg Oral Daily   mometasone-formoterol  2 puff Inhalation BID   predniSONE  40 mg Oral Q breakfast   sodium chloride flush  3 mL Intravenous Q12H   Continuous Infusions: PRN Meds:.acetaminophen **OR** acetaminophen, albuterol, ALPRAZolam, polyethylene glycol   I have personally reviewed following labs and imaging studies  LABORATORY DATA: CBC: Recent Labs  Lab 05/19/22 1202 05/19/22 1214 05/20/22 0500  WBC 14.6*  --  10.4  HGB 13.2 14.3 12.2  HCT 41.6 42.0 39.6  MCV 101.2*  --  100.8*  PLT 192  --  384    Basic Metabolic Panel: Recent Labs  Lab 05/19/22 1202 05/19/22 1214 05/20/22 0500  NA 140 138 138  K 4.0 4.0 4.0  CL 94*  --  93*  CO2 39*  --  37*  GLUCOSE 115*  --  143*  BUN 8  --  16  CREATININE 0.49  --  0.70  CALCIUM 8.9  --  8.9    GFR: CrCl cannot be calculated (Unknown ideal weight.).  Liver Function Tests: Recent Labs  Lab 05/19/22 1202 05/20/22 0500  AST 15 14*  ALT 16 15  ALKPHOS 40 39  BILITOT 1.1 0.7  PROT 6.5 6.3*  ALBUMIN 3.3* 3.1*   No results for input(s): "LIPASE", "AMYLASE" in the last 168 hours. No results for input(s): "AMMONIA" in the last 168 hours.  Coagulation Profile: No results for input(s): "INR", "PROTIME" in the last 168 hours.  Cardiac Enzymes: No results for input(s): "CKTOTAL", "CKMB", "CKMBINDEX", "TROPONINI" in the last 168 hours.  BNP (last 3 results) No results for input(s): "PROBNP" in the last 8760 hours.  Lipid Profile: No results for input(s): "CHOL", "HDL", "LDLCALC", "TRIG", "CHOLHDL", "LDLDIRECT" in the last 72 hours.  Thyroid Function Tests: No results for input(s): "TSH", "T4TOTAL", "FREET4", "T3FREE", "THYROIDAB" in the last 72 hours.  Anemia Panel: No  results for input(s): "VITAMINB12", "FOLATE", "FERRITIN", "TIBC", "IRON", "RETICCTPCT" in the last 72 hours.  Urine analysis:    Component Value Date/Time   COLORURINE YELLOW 02/02/2022 1430   APPEARANCEUR HAZY (A) 02/02/2022 1430   LABSPEC <1.005 (L) 02/02/2022 1430   PHURINE 5.0 02/02/2022 1430   GLUCOSEU NEGATIVE 02/02/2022 1430   HGBUR SMALL (A) 02/02/2022 1430   BILIRUBINUR NEGATIVE 02/02/2022 1430   KETONESUR NEGATIVE 02/02/2022 1430   PROTEINUR 30 (A) 02/02/2022 1430   UROBILINOGEN 0.2 12/24/2007 1833   NITRITE NEGATIVE 02/02/2022 1430   LEUKOCYTESUR LARGE (A) 02/02/2022 1430    Sepsis Labs: Lactic Acid, Venous    Component Value Date/Time   LATICACIDVEN 1.0 02/02/2022 0331    MICROBIOLOGY: Recent Results (from the past 240 hour(s))  Resp panel by RT-PCR (RSV, Flu A&B, Covid) Anterior Nasal Swab     Status: None   Collection Time: 05/19/22 11:42 AM   Specimen: Anterior Nasal Swab  Result Value Ref Range Status   SARS Coronavirus 2 by RT PCR NEGATIVE NEGATIVE Final    Comment: (NOTE) SARS-CoV-2 target nucleic acids are NOT DETECTED.  The SARS-CoV-2 RNA is generally detectable in upper respiratory specimens during the acute phase of infection. The lowest concentration of SARS-CoV-2 viral copies this assay can detect is 138 copies/mL. A negative result does not preclude SARS-Cov-2 infection and should not be used as the sole basis for treatment or other patient management decisions. A negative result may occur with  improper specimen collection/handling, submission of specimen other than nasopharyngeal swab, presence of viral mutation(s) within the areas targeted by this assay, and inadequate number of viral copies(<138 copies/mL). A negative result must be combined with clinical observations, patient history, and epidemiological information. The expected result is Negative.  Fact Sheet for Patients:  EntrepreneurPulse.com.au  Fact Sheet for  Healthcare Providers:  IncredibleEmployment.be  This test is no t yet approved or cleared by the [place] FDA and  has been authorized for detection and/or diagnosis of SARS-CoV-2 by FDA under an Emergency Use Authorization (EUA). This EUA will remain  in effect (meaning this test can be used) for the duration of the COVID-19 declaration under Section 564(b)(1) of the Act, 21 U.S.C.section 360bbb-3(b)(1), unless the authorization is terminated  or revoked sooner.       Influenza A by PCR NEGATIVE NEGATIVE Final   Influenza B by PCR NEGATIVE NEGATIVE Final    Comment: (NOTE) The Xpert Xpress SARS-CoV-2/FLU/RSV plus assay is intended as an aid in the diagnosis of influenza from Nasopharyngeal swab specimens and should not be used as a sole basis for treatment. Nasal washings and aspirates are unacceptable for Xpert Xpress SARS-CoV-2/FLU/RSV testing.  Fact Sheet for Patients: EntrepreneurPulse.com.au  Fact Sheet for Healthcare Providers: IncredibleEmployment.be  This test is not yet approved or cleared by the [place] FDA and has been authorized for detection and/or diagnosis of SARS-CoV-2 by FDA under an Emergency Use Authorization (EUA). This EUA will remain in effect (meaning this test can be used) for the duration of the COVID-19 declaration under Section 564(b)(1) of the Act, 21 U.S.C. section 360bbb-3(b)(1), unless the authorization is terminated or revoked.     Resp Syncytial Virus by PCR NEGATIVE NEGATIVE Final    Comment: (NOTE) Fact Sheet for Patients: EntrepreneurPulse.com.au  Fact Sheet for Healthcare Providers: IncredibleEmployment.be  This test is not yet approved or cleared by the [place] FDA and has been authorized for detection and/or diagnosis of SARS-CoV-2 by FDA under an Emergency Use Authorization (EUA). This EUA will remain in effect (meaning this  test can be used) for the duration of the COVID-19 declaration under Section 564(b)(1) of the Act, 21 U.S.C. section 360bbb-3(b)(1), unless the authorization is terminated or revoked.  Performed at [facility], [place] [address] [place], [place] [address]   Respiratory (~20 pathogens) panel by PCR     Status: None   Collection Time: 05/19/22 11:42 AM   Specimen: Nasopharyngeal Swab; Respiratory  Result Value Ref Range Status   Adenovirus NOT DETECTED NOT DETECTED Final   Coronavirus 229E NOT DETECTED NOT DETECTED Final    Comment: (NOTE) The Coronavirus on the Respiratory Panel, DOES NOT test for the novel  Coronavirus (2019 nCoV)    Coronavirus HKU1 NOT DETECTED NOT DETECTED Final   Coronavirus NL63 NOT DETECTED NOT DETECTED Final   Coronavirus OC43 NOT DETECTED NOT DETECTED Final   Metapneumovirus NOT DETECTED NOT DETECTED Final   Rhinovirus / Enterovirus NOT  DETECTED NOT DETECTED Final   Influenza A NOT DETECTED NOT DETECTED Final   Influenza B NOT DETECTED NOT DETECTED Final   Parainfluenza Virus 1 NOT DETECTED NOT DETECTED Final   Parainfluenza Virus 2 NOT DETECTED NOT DETECTED Final   Parainfluenza Virus 3 NOT DETECTED NOT DETECTED Final   Parainfluenza Virus 4 NOT DETECTED NOT DETECTED Final   Respiratory Syncytial Virus NOT DETECTED NOT DETECTED Final   Bordetella pertussis NOT DETECTED NOT DETECTED Final   Bordetella Parapertussis NOT DETECTED NOT DETECTED Final   Chlamydophila pneumoniae NOT DETECTED NOT DETECTED Final   Mycoplasma pneumoniae NOT DETECTED NOT DETECTED Final    Comment: Performed at [facility], [place] [address], [place], [place] [address]    RADIOLOGY STUDIES/RESULTS: CT Angio Chest Pulmonary Embolism (PE) W or WO Contrast  Result Date: 05/19/2022 CLINICAL DATA:  Pulmonary embolism (PE) suspected, high prob. Respiratory distress EXAM: CT ANGIOGRAPHY CHEST WITH CONTRAST TECHNIQUE: Multidetector CT imaging of the chest was performed using  the standard protocol during bolus administration of intravenous contrast. Multiplanar CT image reconstructions and MIPs were obtained to evaluate the vascular anatomy. RADIATION DOSE REDUCTION: This exam was performed according to the departmental dose-optimization program which includes automated exposure control, adjustment of the mA and/or kV according to patient size and/or use of iterative reconstruction technique. CONTRAST:  53m OMNIPAQUE IOHEXOL 350 MG/ML SOLN COMPARISON:  02/02/2022 FINDINGS: Cardiovascular: No filling defects in the pulmonary arteries to suggest pulmonary emboli. Heart is normal size. Aorta is normal caliber. Moderate coronary artery and aortic calcifications. Mediastinum/Nodes: Prominent mediastinal and bilateral hilar lymph nodes, none pathologically enlarged. These are likely reactive and are stable. Trachea and esophagus are unremarkable. Thyroid unremarkable. Lungs/Pleura: Trace bilateral pleural effusions, slightly decreased since prior study. Dependent bibasilar atelectasis. Upper Abdomen: No acute findings Musculoskeletal: Chest wall soft tissues are unremarkable. No acute bony abnormality. Review of the MIP images confirms the above findings. IMPRESSION: No evidence of pulmonary embolus. Trace bilateral effusions, dependent bibasilar atelectasis. Coronary artery disease. Aortic Atherosclerosis (ICD10-I70.0). Electronically Signed   By: KRolm BaptiseM.D.   On: 05/19/2022 18:00   DG Chest Port 1 View  Result Date: 05/19/2022 CLINICAL DATA:  Shortness of breath. Respiratory distress; new onset. EXAM: PORTABLE CHEST 1 VIEW COMPARISON:  Radiographs 04/29/2022 and 02/06/2022.  CT 02/02/2022. FINDINGS: 1237 hours. The heart size and mediastinal contours are stable. The lungs are hyperinflated but clear. No pneumothorax or significant pleural effusion demonstrated. There are surgical clips in the right axilla. No acute osseous findings are evident. Telemetry leads overlie the chest.  IMPRESSION: Chronic obstructive pulmonary disease. No acute cardiopulmonary process. Electronically Signed   By: WRichardean SaleM.D.   On: 05/19/2022 12:51     LOS: 1 day   SOren Binet MD  [facility]    To contact the attending provider between 7A-7P or the covering provider during after hours 7P-7A, please log into the web site [email or web address] and access using universal [place] password for that web site. If you do not have the password, please call the hospital operator.  05/20/2022, 8:47 AM

## 2022-05-20 NOTE — Progress Notes (Signed)
NAME:  Caroline Greer, MRN:  [id number], DOB:  [birth year], LOS: 1 ADMISSION DATE:  05/19/2022, CONSULTATION DATE:  05/19/22 REFERRING MD:  Jeanell Sparrow, CHIEF COMPLAINT:  SOB   History of Present Illness:  66 year old woman with hx of COPD on chronic oxygen followed in our office who presents with rather sudden onset chest pressure and SOB unrelieved by home albuterol.  Some associated leg swelling.  No cough, fever, chills, sick contacts.  Has history of broken sternum which she thinks pain is from.   Also hx R breast Ca initially diagnosed 04/2021 (stage 1A, HER2 negative), had R lumpectomy and treated with radiation therapy completed 09/2021 and now on Exemestane.  Recent admit Sept for pleurisy found to have effusions. No good cause for her persistent hypoxemia found but did improve with diuresis.  Pertinent  Medical History   Past Medical History:  Diagnosis Date   Acute respiratory failure with hypoxia ([place]) 09/30/2018   Breast cancer ([place])    Constipation    COPD exacerbation ([place]) 01/05/2019   Depression with anxiety 09/30/2018   Dyspnea    Essential hypertension 09/30/2018   History of radiation therapy    Right breast- 09/01/21-10/16/21- Dr. Gery Pray   Hyperlipidemia 02/02/2022   Hypertension    Tobacco dependence due to cigarettes 09/30/2018     Significant Hospital Events: Including procedures, antibiotic start and stop dates in addition to other pertinent events   12/27 admit 05/20/2022 2D echo with bubble study  Interim History / Subjective:  Reports feeling better  Objective   Blood pressure 122/75, pulse 98, temperature 98.8 F (37.1 C), temperature source Oral, resp. rate 18, SpO2 95 %.    FiO2 (%):  [50 %-80 %] 76 %  No intake or output data in the 24 hours ending 05/20/22 1051 There were no vitals filed for this visit.  Examination: General: Well-nourished well-developed female no acute distress HEENT: MM pink/moist no JVD is appreciated Neuro: Intact  without focal defect CV: Heart sounds are regular PULM: Diminished breath sounds throughout  GI: soft, bsx4 active  GU: Extremities: warm/dry, 1+ edema  Skin: no rashes or lesions   Resolved Hospital Problem list   N/A  Assessment & Plan:  Acute on chronic hypoxemic respiratory failure- acting like a shunt with clear CXR, profound O2 need Chronic hypercarbic respiratory failure ?why on eliquis- states compliance Hx of breast cancer post surgery and chemoradiation  2D echo with bubble study has been performed but not read as of 05/20/2022 1100 hrs. Currently on high flow nasal cannula 20 L sats are 94% CT angio chest is negative for pulmonary embolism Diuresis on board but no intake and output has been performed in the emergency department at this time Pulmonary will continue to follow  Best Practice (right click and "Reselect all SmartList Selections" daily)  Per primary  Labs   CBC: Recent Labs  Lab 05/19/22 1202 05/19/22 1214 05/20/22 0500  WBC 14.6*  --  10.4  HGB 13.2 14.3 12.2  HCT 41.6 42.0 39.6  MCV 101.2*  --  100.8*  PLT 192  --  627    Basic Metabolic Panel: Recent Labs  Lab 05/19/22 1202 05/19/22 1214 05/20/22 0500  NA 140 138 138  K 4.0 4.0 4.0  CL 94*  --  93*  CO2 39*  --  37*  GLUCOSE 115*  --  143*  BUN 8  --  16  CREATININE 0.49  --  0.70  CALCIUM 8.9  --  8.9   GFR: CrCl cannot be calculated (Unknown ideal weight.). Recent Labs  Lab 05/19/22 1202 05/20/22 0500  PROCALCITON  --  0.16  WBC 14.6* 10.4    Liver Function Tests: Recent Labs  Lab 05/19/22 1202 05/20/22 0500  AST 15 14*  ALT 16 15  ALKPHOS 40 39  BILITOT 1.1 0.7  PROT 6.5 6.3*  ALBUMIN 3.3* 3.1*   No results for input(s): "LIPASE", "AMYLASE" in the last 168 hours. No results for input(s): "AMMONIA" in the last 168 hours.  ABG    Component Value Date/Time   PHART 7.327 (L) 10/02/2019 0832   PCO2ART 60.1 (H) 10/02/2019 0832   PO2ART 103 10/02/2019 0832    HCO3 40.6 (H) 05/19/2022 1214   TCO2 42 (H) 05/19/2022 1214   O2SAT 93 05/19/2022 1214     Coagulation Profile: No results for input(s): "INR", "PROTIME" in the last 168 hours.  Cardiac Enzymes: No results for input(s): "CKTOTAL", "CKMB", "CKMBINDEX", "TROPONINI" in the last 168 hours.  HbA1C: No results found for: "HGBA1C"  CBG: No results for input(s): "GLUCAP" in the last 168 hours.   Critical care time: N/A    Richardson Landry Shaquon Gropp ACNP Acute Care Nurse Practitioner [place] Please consult Amion 05/20/2022, 10:51 AM

## 2022-05-20 NOTE — Plan of Care (Signed)

## 2022-05-20 NOTE — Progress Notes (Signed)
Echocardiogram 2D Echocardiogram has been performed.  Oneal Deputy Bohden Dung RDCS 05/20/2022, 9:06 AM

## 2022-05-20 NOTE — ED Notes (Signed)
Pt getting up to bedside commode frequently without assistance. Pt refuses bedpan or purewick. SaO2 dropping into 70s when pt gets up. Pt endorses mild increase in work of breathing when out of bed, but states "it's nothing I can't handle." Pt able to speak in full sentences. SaO2 improves back to low 90s once back in bed.

## 2022-05-21 DIAGNOSIS — I10 Essential (primary) hypertension: Secondary | ICD-10-CM | POA: Diagnosis not present

## 2022-05-21 DIAGNOSIS — J9601 Acute respiratory failure with hypoxia: Secondary | ICD-10-CM | POA: Diagnosis not present

## 2022-05-21 DIAGNOSIS — I4891 Unspecified atrial fibrillation: Secondary | ICD-10-CM | POA: Diagnosis not present

## 2022-05-21 DIAGNOSIS — J441 Chronic obstructive pulmonary disease with (acute) exacerbation: Secondary | ICD-10-CM | POA: Diagnosis not present

## 2022-05-21 LAB — BASIC METABOLIC PANEL
Anion gap: 10 (ref 5–15)
BUN: 18 mg/dL (ref 8–23)
CO2: 40 mmol/L — ABNORMAL HIGH (ref 22–32)
Calcium: 9.1 mg/dL (ref 8.9–10.3)
Chloride: 92 mmol/L — ABNORMAL LOW (ref 98–111)
Creatinine, Ser: 0.67 mg/dL (ref 0.44–1.00)
GFR, Estimated: >60 mL/min (ref >60)
Glucose, Bld: 131 mg/dL — ABNORMAL HIGH (ref 70–99)
Potassium: 3.7 mmol/L (ref 3.5–5.1)
Sodium: 142 mmol/L (ref 135–145)

## 2022-05-21 LAB — BRAIN NATRIURETIC PEPTIDE: B Natriuretic Peptide: 19.2 pg/mL (ref 0.0–100.0)

## 2022-05-21 LAB — C-REACTIVE PROTEIN: CRP: 4.6 mg/dL — ABNORMAL HIGH (ref <1.0)

## 2022-05-21 MED ORDER — IPRATROPIUM BROMIDE 0.02 % IN SOLN
0.5000 mg | Freq: Four times a day (QID) | RESPIRATORY_TRACT | Status: DC | PRN
  Administered 2022-05-22: 0.5 mg via RESPIRATORY_TRACT
  Filled 2022-05-21: qty 2.5

## 2022-05-21 MED ORDER — MAGNESIUM HYDROXIDE 400 MG/5ML PO SUSP
15.0000 mL | Freq: Once | ORAL | Status: AC
  Administered 2022-05-22: 15 mL via ORAL
  Filled 2022-05-21: qty 30

## 2022-05-21 NOTE — Progress Notes (Signed)
PROGRESS NOTE        PATIENT DETAILS Name: Caroline Greer Age: 66 y.o. Sex: female Date of Birth: April 19, 1956 Admit Date: 05/19/2022 Admitting Physician Marcelyn Bruins, MD WIO:[id number], [name], MD  Brief Summary: Patient is a 66 y.o.  female with history of COPD on home O2 (4 L at rest-6 L with activity), A-fib on Eliquis-presented to the hospital with worsening shortness of breath x 1 day-found to have acute on chronic hypoxic respiratory failure-initially requiring BiPAP but subsequently transition to [facility]NC.  Etiology of hypoxia unclear at this time.  See below for further details.  Significant events: 12/27>> presented with shortness of breath-severe hypoxemia-initially on BiPAP but then transition to [facility].  Admit to [facility]. 12/29>>transitioned from [facility] to [facility]ter  Significant studies: 12/27>> CTA chest: No PE, trace bilateral effusions, dependent bibasilar atelectasis. 12/28>> Limited echo with bubble study: Bubble study negative-no evidence of interatrial shunt.  Significant microbiology data: 12/27>> respiratory virus panel: Negative 12/27>> COVID/influenza/RSV: Negative  Procedures: None  Consults: PCCM  Subjective: Claims she feels better-she ambulated in the room-and thinks she was back to her usual baseline exertional dyspnea.  She was on heated high low-but was able to be transition to salter high flow today.    Objective: Vitals: Blood pressure 129/81, pulse 99, temperature 98.6 F (37 C), temperature source Oral, resp. rate 20, SpO2 96 %.   Exam: Gen Exam:Alert awake-not in any distress HEENT:atraumatic, normocephalic Chest: B/L clear to auscultation anteriorly CVS:S1S2 regular Abdomen:soft non tender, non distended Extremities:no edema Neurology: Non focal Skin: no rash  Pertinent Labs/Radiology:    Latest Ref Rng & Units 05/20/2022    5:00 AM 05/19/2022   12:14 PM 05/19/2022   12:02 PM  CBC  WBC 4.0 - 10.5 K/uL 10.4   14.6    Hemoglobin 12.0 - 15.0 g/dL 12.2  14.3  13.2   Hematocrit 36.0 - 46.0 % 39.6  42.0  41.6   Platelets 150 - 400 K/uL 193   192     Lab Results  Component Value Date   NA 142 05/21/2022   K 3.7 05/21/2022   CL 92 (L) 05/21/2022   CO2 40 (H) 05/21/2022      Assessment/Plan: Acute on chronic hypoxic respiratory failure Etiology unclear-no PE on CTA chest, lung parenchyma appears to be clear-minimal pleural effusions cannot explain this degree of hypoxemia.  No fluid overload on exam.  No wheezing suggestive of COPD exacerbation.  Echo bubble study negative for shunt.   She seems to have responded to bronchodilators/steroids/diuretics and other supportive care (downgraded from heated high flow to salter high flow)-although etiology unclear-suspect that she may have had multifactorial etiology with some CHF/bronchospasm potentially causing worsening of her chronic hypoxemia.   Continue IV Lasix-to keep negative balance, slowly taper steroids-continue bronchodilators. PCCM following-hopefully home over the next few days as her FiO2 needs (currently 10-15 L) come down to close to baseline regimen (4 L at rest-and 6 L with ambulation)  COPD No wheezing-does not appear to have an acute exacerbation Given severity of hypoxemia-being treated with bronchodilators and a course of tapering steroids to see if this helps improve her hypoxemia.  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  PAF Sinus rhythm Continue Eliquis/metoprolol  HTN BP stable Continue amlodipine/metoprolol  History of stage Ia right breast cancer-s/p  lumpectomy (07/23/2021) and adjuvant radiation therapy (4/11-5/26/2023)-and antiestrogen therapy with exemestane CT chest without any evidence of radiation fibrosis to explain hypoxemia Continue outpatient follow-up with primary oncologist-Dr. [name].   BMI: Estimated  body mass index is 23.56 kg/m as calculated from the following:   Height as of 04/29/22: '5\' 10"'$  (1.778 m).   Weight as of 04/29/22: 74.5 kg.   Code status:   Code Status: Full Code   DVT Prophylaxis: apixaban (ELIQUIS) tablet 5 mg    Family Communication: None at bedside  Disposition Plan: Status is: Inpatient Remains inpatient appropriate because: Severity of hypoxemia    Planned Discharge Destination:Home health   Diet: Diet Order             Diet regular Room service appropriate? Yes; Fluid consistency: Thin  Diet effective now                     Antimicrobial agents: Anti-infectives (From admission, onward)    None        MEDICATIONS: Scheduled Meds:  amLODipine  5 mg Oral Daily   apixaban  5 mg Oral BID   exemestane  12.5 mg Oral QPC breakfast   furosemide  40 mg Intravenous Daily   metoprolol succinate  50 mg Oral Daily   mometasone-formoterol  2 puff Inhalation BID   predniSONE  40 mg Oral Q breakfast   sodium chloride flush  3 mL Intravenous Q12H   Continuous Infusions: PRN Meds:.acetaminophen **OR** acetaminophen, albuterol, ALPRAZolam, ipratropium, polyethylene glycol   I have personally reviewed following labs and imaging studies  LABORATORY DATA: CBC: Recent Labs  Lab 05/19/22 1202 05/19/22 1214 05/20/22 0500  WBC 14.6*  --  10.4  HGB 13.2 14.3 12.2  HCT 41.6 42.0 39.6  MCV 101.2*  --  100.8*  PLT 192  --  193     Basic Metabolic Panel: Recent Labs  Lab 05/19/22 1202 05/19/22 1214 05/20/22 0500 05/21/22 0057  NA 140 138 138 142  K 4.0 4.0 4.0 3.7  CL 94*  --  93* 92*  CO2 39*  --  37* 40*  GLUCOSE 115*  --  143* 131*  BUN 8  --  16 18  CREATININE 0.49  --  0.70 0.67  CALCIUM 8.9  --  8.9 9.1     GFR: CrCl cannot be calculated (Unknown ideal weight.).  Liver Function Tests: Recent Labs  Lab 05/19/22 1202 05/20/22 0500  AST 15 14*  ALT 16 15  ALKPHOS 40 39  BILITOT 1.1 0.7  PROT 6.5 6.3*  ALBUMIN 3.3*  3.1*    No results for input(s): "LIPASE", "AMYLASE" in the last 168 hours. No results for input(s): "AMMONIA" in the last 168 hours.  Coagulation Profile: No results for input(s): "INR", "PROTIME" in the last 168 hours.  Cardiac Enzymes: No results for input(s): "CKTOTAL", "CKMB", "CKMBINDEX", "TROPONINI" in the last 168 hours.  BNP (last 3 results) No results for input(s): "PROBNP" in the last 8760 hours.  Lipid Profile: No results for input(s): "CHOL", "HDL", "LDLCALC", "TRIG", "CHOLHDL", "LDLDIRECT" in the last 72 hours.  Thyroid Function Tests: No results for input(s): "TSH", "T4TOTAL", "FREET4", "T3FREE", "THYROIDAB" in the last 72 hours.  Anemia Panel: No results for input(s): "VITAMINB12", "FOLATE", "FERRITIN", "TIBC", "IRON", "RETICCTPCT" in the last 72 hours.  Urine analysis:    Component Value Date/Time   COLORURINE YELLOW 02/02/2022 1430   APPEARANCEUR HAZY (A) 02/02/2022 1430   LABSPEC <1.005 (L) 02/02/2022 1430   PHURINE  5.0 02/02/2022 1430   GLUCOSEU NEGATIVE 02/02/2022 1430   HGBUR SMALL (A) 02/02/2022 1430   BILIRUBINUR NEGATIVE 02/02/2022 1430   KETONESUR NEGATIVE 02/02/2022 1430   PROTEINUR 30 (A) 02/02/2022 1430   UROBILINOGEN 0.2 12/24/2007 1833   NITRITE NEGATIVE 02/02/2022 1430   LEUKOCYTESUR LARGE (A) 02/02/2022 1430    Sepsis Labs: Lactic Acid, Venous    Component Value Date/Time   LATICACIDVEN 1.0 02/02/2022 0331    MICROBIOLOGY: Recent Results (from the past 240 hour(s))  Resp panel by RT-PCR (RSV, Flu A&B, Covid) Anterior Nasal Swab     Status: None   Collection Time: 05/19/22 11:42 AM   Specimen: Anterior Nasal Swab  Result Value Ref Range Status   SARS Coronavirus 2 by RT PCR NEGATIVE NEGATIVE Final    Comment: (NOTE) SARS-CoV-2 target nucleic acids are NOT DETECTED.  The SARS-CoV-2 RNA is generally detectable in upper respiratory specimens during the acute phase of infection. The lowest concentration of SARS-CoV-2 viral copies  this assay can detect is 138 copies/mL. A negative result does not preclude SARS-Cov-2 infection and should not be used as the sole basis for treatment or other patient management decisions. A negative result may occur with  improper specimen collection/handling, submission of specimen other than nasopharyngeal swab, presence of viral mutation(s) within the areas targeted by this assay, and inadequate number of viral copies(<138 copies/mL). A negative result must be combined with clinical observations, patient history, and epidemiological information. The expected result is Negative.  Fact Sheet for Patients:  EntrepreneurPulse.com.au  Fact Sheet for Healthcare Providers:  IncredibleEmployment.be  This test is no t yet approved or cleared by the [place] FDA and  has been authorized for detection and/or diagnosis of SARS-CoV-2 by FDA under an Emergency Use Authorization (EUA). This EUA will remain  in effect (meaning this test can be used) for the duration of the COVID-19 declaration under Section 564(b)(1) of the Act, 21 U.S.C.section 360bbb-3(b)(1), unless the authorization is terminated  or revoked sooner.       Influenza A by PCR NEGATIVE NEGATIVE Final   Influenza B by PCR NEGATIVE NEGATIVE Final    Comment: (NOTE) The Xpert Xpress SARS-CoV-2/FLU/RSV plus assay is intended as an aid in the diagnosis of influenza from Nasopharyngeal swab specimens and should not be used as a sole basis for treatment. Nasal washings and aspirates are unacceptable for Xpert Xpress SARS-CoV-2/FLU/RSV testing.  Fact Sheet for Patients: EntrepreneurPulse.com.au  Fact Sheet for Healthcare Providers: IncredibleEmployment.be  This test is not yet approved or cleared by the [place] FDA and has been authorized for detection and/or diagnosis of SARS-CoV-2 by FDA under an Emergency Use Authorization (EUA). This EUA  will remain in effect (meaning this test can be used) for the duration of the COVID-19 declaration under Section 564(b)(1) of the Act, 21 U.S.C. section 360bbb-3(b)(1), unless the authorization is terminated or revoked.     Resp Syncytial Virus by PCR NEGATIVE NEGATIVE Final    Comment: (NOTE) Fact Sheet for Patients: EntrepreneurPulse.com.au  Fact Sheet for Healthcare Providers: IncredibleEmployment.be  This test is not yet approved or cleared by the [place] FDA and has been authorized for detection and/or diagnosis of SARS-CoV-2 by FDA under an Emergency Use Authorization (EUA). This EUA will remain in effect (meaning this test can be used) for the duration of the COVID-19 declaration under Section 564(b)(1) of the Act, 21 U.S.C. section 360bbb-3(b)(1), unless the authorization is terminated or revoked.  Performed at [facility], [place]  [address], [place], [place] [address]   Respiratory (~20 pathogens) panel by PCR     Status: None   Collection Time: 05/19/22 11:42 AM   Specimen: Nasopharyngeal Swab; Respiratory  Result Value Ref Range Status   Adenovirus NOT DETECTED NOT DETECTED Final   Coronavirus 229E NOT DETECTED NOT DETECTED Final    Comment: (NOTE) The Coronavirus on the Respiratory Panel, DOES NOT test for the novel  Coronavirus (2019 nCoV)    Coronavirus HKU1 NOT DETECTED NOT DETECTED Final   Coronavirus NL63 NOT DETECTED NOT DETECTED Final   Coronavirus OC43 NOT DETECTED NOT DETECTED Final   Metapneumovirus NOT DETECTED NOT DETECTED Final   Rhinovirus / Enterovirus NOT DETECTED NOT DETECTED Final   Influenza A NOT DETECTED NOT DETECTED Final   Influenza B NOT DETECTED NOT DETECTED Final   Parainfluenza Virus 1 NOT DETECTED NOT DETECTED Final   Parainfluenza Virus 2 NOT DETECTED NOT DETECTED Final   Parainfluenza Virus 3 NOT DETECTED NOT DETECTED Final   Parainfluenza Virus 4 NOT DETECTED NOT DETECTED Final    Respiratory Syncytial Virus NOT DETECTED NOT DETECTED Final   Bordetella pertussis NOT DETECTED NOT DETECTED Final   Bordetella Parapertussis NOT DETECTED NOT DETECTED Final   Chlamydophila pneumoniae NOT DETECTED NOT DETECTED Final   Mycoplasma pneumoniae NOT DETECTED NOT DETECTED Final    Comment: Performed at [facility], [place]. [address], [place], [place] [address]    RADIOLOGY STUDIES/RESULTS: ECHOCARDIOGRAM LIMITED BUBBLE STUDY  Result Date: 05/20/2022    ECHOCARDIOGRAM LIMITED REPORT   Patient Name:   NYARA CAPELL Date of Exam: 05/20/2022 Medical Rec #:  [id number]     Height:       70.0 in Accession #:    [id number]    Weight:       164.2 lb Date of Birth:  September 03, 1955     BSA:          1.919 m Patient Age:    39 years      BP:           136/91 mmHg Patient Gender: F             HR:           100 bpm. Exam Location:  Inpatient Procedure: Limited Echo and Saline Contrast Bubble Study Indications:    Hypoxia, ?ASD  History:        Patient has prior history of Echocardiogram examinations, most                 recent 02/02/2022. COPD, Arrythmias:Atrial Fibrillation; Risk                 Factors:Hypertension and Dyslipidemia.  Sonographer:    Raquel Sarna Senior RDCS Referring Phys: [id number] Candee Furbish  Sonographer Comments: Limited bubble to evaluate for ASD. IMPRESSIONS  1. Agitated saline contrast bubble study was negative, with no evidence of any interatrial shunt.  2. The left ventricle has normal function. FINDINGS  Left Ventricle: The left ventricle has normal function. IAS/Shunts: Agitated saline contrast was given intravenously to evaluate for intracardiac shunting. Agitated saline contrast bubble study was negative, with no evidence of any interatrial shunt. Godfrey Pick Tobb DO Electronically signed by Berniece Salines DO Signature Date/Time: 05/20/2022/1:17:46 PM    Final    CT Angio Chest Pulmonary Embolism (PE) W or WO Contrast  Result Date: 05/19/2022 CLINICAL DATA:  Pulmonary embolism (PE)  suspected, high prob. Respiratory distress EXAM: CT ANGIOGRAPHY CHEST WITH CONTRAST TECHNIQUE: Multidetector CT imaging  of the chest was performed using the standard protocol during bolus administration of intravenous contrast. Multiplanar CT image reconstructions and MIPs were obtained to evaluate the vascular anatomy. RADIATION DOSE REDUCTION: This exam was performed according to the departmental dose-optimization program which includes automated exposure control, adjustment of the mA and/or kV according to patient size and/or use of iterative reconstruction technique. CONTRAST:  25m OMNIPAQUE IOHEXOL 350 MG/ML SOLN COMPARISON:  02/02/2022 FINDINGS: Cardiovascular: No filling defects in the pulmonary arteries to suggest pulmonary emboli. Heart is normal size. Aorta is normal caliber. Moderate coronary artery and aortic calcifications. Mediastinum/Nodes: Prominent mediastinal and bilateral hilar lymph nodes, none pathologically enlarged. These are likely reactive and are stable. Trachea and esophagus are unremarkable. Thyroid unremarkable. Lungs/Pleura: Trace bilateral pleural effusions, slightly decreased since prior study. Dependent bibasilar atelectasis. Upper Abdomen: No acute findings Musculoskeletal: Chest wall soft tissues are unremarkable. No acute bony abnormality. Review of the MIP images confirms the above findings. IMPRESSION: No evidence of pulmonary embolus. Trace bilateral effusions, dependent bibasilar atelectasis. Coronary artery disease. Aortic Atherosclerosis (ICD10-I70.0). Electronically Signed   By: KRolm BaptiseM.D.   On: 05/19/2022 18:00   DG Chest Port 1 View  Result Date: 05/19/2022 CLINICAL DATA:  Shortness of breath. Respiratory distress; new onset. EXAM: PORTABLE CHEST 1 VIEW COMPARISON:  Radiographs 04/29/2022 and 02/06/2022.  CT 02/02/2022. FINDINGS: 1237 hours. The heart size and mediastinal contours are stable. The lungs are hyperinflated but clear. No pneumothorax or  significant pleural effusion demonstrated. There are surgical clips in the right axilla. No acute osseous findings are evident. Telemetry leads overlie the chest. IMPRESSION: Chronic obstructive pulmonary disease. No acute cardiopulmonary process. Electronically Signed   By: WRichardean SaleM.D.   On: 05/19/2022 12:51     LOS: 2 days   SOren Binet MD  [facility]    To contact the attending provider between 7A-7P or the covering provider during after hours 7P-7A, please log into the web site [email or web address] and access using universal [place] password for that web site. If you do not have the password, please call the hospital operator.  05/21/2022, 12:26 PM

## 2022-05-21 NOTE — Care Management Important Message (Signed)
Important Message  Patient Details  Name: Caroline Greer MRN: [id number] Date of Birth: Sep 19, 1955   Medicare Important Message Given:  Yes     [place] [address] 05/21/2022, 3:17 PM

## 2022-05-21 NOTE — Progress Notes (Signed)
NAME:  Caroline Greer, MRN:  [id number], DOB:  08/06/1955, LOS: 2 ADMISSION DATE:  05/19/2022, CONSULTATION DATE:  05/19/22 REFERRING MD:  Jeanell Sparrow, CHIEF COMPLAINT:  SOB   History of Present Illness:  66 year old woman with hx of COPD on chronic oxygen followed in our office who presents with rather sudden onset chest pressure and SOB unrelieved by home albuterol.  Some associated leg swelling.  No cough, fever, chills, sick contacts.  Has history of broken sternum which she thinks pain is from.   Also hx R breast Ca initially diagnosed 04/2021 (stage 1A, HER2 negative), had R lumpectomy and treated with radiation therapy completed 09/2021 and now on Exemestane.  Recent admit Sept for pleurisy found to have effusions. No good cause for her persistent hypoxemia found but did improve with diuresis.  Pertinent  Medical History   Past Medical History:  Diagnosis Date   Acute respiratory failure with hypoxia ([place]) 09/30/2018   Breast cancer ([place])    Constipation    COPD exacerbation ([place]) 01/05/2019   Depression with anxiety 09/30/2018   Dyspnea    Essential hypertension 09/30/2018   History of radiation therapy    Right breast- 09/01/21-10/16/21- Dr. Gery Pray   Hyperlipidemia 02/02/2022   Hypertension    Tobacco dependence due to cigarettes 09/30/2018     Significant Hospital Events: Including procedures, antibiotic start and stop dates in addition to other pertinent events   12/27 admit 05/20/2022 2D echo with bubble study with negative shunt  Interim History / Subjective:  Reports feeling better  Objective   Blood pressure 129/81, pulse 99, temperature 98.6 F (37 C), temperature source Oral, resp. rate 20, SpO2 96 %.    FiO2 (%):  [71 %-80 %] 80 %  No intake or output data in the 24 hours ending 05/21/22 1207 There were no vitals filed for this visit.  Examination: General: Obese female no acute distress HEENT: MM pink/moist no JVD is appreciated. Neuro: Grossly  intact CV: Heart sounds are regular PULM: Diminished in the bases currently on 13 liters per minute flow  GI: soft, bsx4 active  GU: Extremities: warm/dry,  edema  Skin: no rashes or lesions    Resolved Hospital Problem list   N/A  Assessment & Plan:  Acute on chronic hypoxemic respiratory failure- acting like a shunt with clear CXR, profound O2 need Chronic hypercarbic respiratory failure ?why on eliquis- states compliance Hx of breast cancer post surgery and chemoradiation  2decho with bubble study was negative Remains on 13 L flow for oxygen Diuresis as tolerated Pulmonary critical care will see again Monday unless needed sooner    Best Practice (right click and "Reselect all SmartList Selections" daily)  Per primary  Labs   CBC: Recent Labs  Lab 05/19/22 1202 05/19/22 1214 05/20/22 0500  WBC 14.6*  --  10.4  HGB 13.2 14.3 12.2  HCT 41.6 42.0 39.6  MCV 101.2*  --  100.8*  PLT 192  --  166    Basic Metabolic Panel: Recent Labs  Lab 05/19/22 1202 05/19/22 1214 05/20/22 0500 05/21/22 0057  NA 140 138 138 142  K 4.0 4.0 4.0 3.7  CL 94*  --  93* 92*  CO2 39*  --  37* 40*  GLUCOSE 115*  --  143* 131*  BUN 8  --  16 18  CREATININE 0.49  --  0.70 0.67  CALCIUM 8.9  --  8.9 9.1   GFR: CrCl cannot be calculated (Unknown ideal weight.). Recent  Labs  Lab 05/19/22 1202 05/20/22 0500  PROCALCITON  --  0.16  WBC 14.6* 10.4    Liver Function Tests: Recent Labs  Lab 05/19/22 1202 05/20/22 0500  AST 15 14*  ALT 16 15  ALKPHOS 40 39  BILITOT 1.1 0.7  PROT 6.5 6.3*  ALBUMIN 3.3* 3.1*   No results for input(s): "LIPASE", "AMYLASE" in the last 168 hours. No results for input(s): "AMMONIA" in the last 168 hours.  ABG    Component Value Date/Time   PHART 7.327 (L) 10/02/2019 0832   PCO2ART 60.1 (H) 10/02/2019 0832   PO2ART 103 10/02/2019 0832   HCO3 40.6 (H) 05/19/2022 1214   TCO2 42 (H) 05/19/2022 1214   O2SAT 93 05/19/2022 1214      Coagulation Profile: No results for input(s): "INR", "PROTIME" in the last 168 hours.  Cardiac Enzymes: No results for input(s): "CKTOTAL", "CKMB", "CKMBINDEX", "TROPONINI" in the last 168 hours.  HbA1C: No results found for: "HGBA1C"  CBG: No results for input(s): "GLUCAP" in the last 168 hours.   Critical care time: N/A    Richardson Landry [place] Kost ACNP Acute Care Nurse Practitioner [place] Please consult Amion 05/21/2022, 12:07 PM

## 2022-05-21 NOTE — Plan of Care (Signed)

## 2022-05-21 NOTE — Evaluation (Signed)
Physical Therapy Evaluation Patient Details Name: Caroline Greer MRN: [id number] DOB: March 25, 1956 Today's Date: 05/21/2022  History of Present Illness  Pt is a 66 y/o female admitted 12/27 with worsening shortness of breath x 1 day-found to have acute on chronic hypoxic respiratory failurePMH: COPD 4 L O2 at baseline, HTN, breast CA with radiation April-May.  Clinical Impression  Pt admitted with above diagnosis. Mobilized independently in room. Ambulates with light supervision in hallway, assist for lines/leads/O2 only, minimal instability noted. Easily fatigued. May benefit from rollator for energy conservation,. SpO2 at rest ranging up to 98% on 9L HFNC. Ambulating at 8L unable to obtain steady reading with poor waveform from pulse ox but seemed to avg in mid 80s, quickly improved upon sitting to mid 90s again. Pt with 2/4 dyspnea; 6/10 RPE with ambulatory bout. Pt currently with functional limitations due to the deficits listed below (see PT Problem List). Pt will benefit from skilled PT to increase their independence and safety with mobility to allow discharge to the venue listed below.          Recommendations for follow up therapy are one component of a multi-disciplinary discharge planning process, led by the attending physician.  Recommendations may be updated based on patient status, additional functional criteria and insurance authorization.  Follow Up Recommendations  (Consider cardiopulmonary rehab)      Assistance Recommended at Discharge PRN  Patient can return home with the following  Assist for transportation    Equipment Recommendations Rollator (4 wheels)  Recommendations for Other Services       Functional Status Assessment Patient has had a recent decline in their functional status and demonstrates the ability to make significant improvements in function in a reasonable and predictable amount of time.     Precautions / Restrictions Precautions Precautions:  None Precaution Comments: monitor O2 Restrictions Weight Bearing Restrictions: No      Mobility  Bed Mobility Overal bed mobility: Modified Independent                  Transfers Overall transfer level: Modified independent Equipment used: None               General transfer comment: Transfers mod I from bed to and from [facility]. No assist needed.    Ambulation/Gait Ambulation/Gait assistance: Supervision Gait Distance (Feet): 100 Feet Assistive device: None Gait Pattern/deviations: Step-through pattern, Drifts right/left Gait velocity: decr Gait velocity interpretation: 1.31 - 2.62 ft/sec, indicative of limited community ambulator   General Gait Details: Minimal instability noted. assist with lines/leads only, no evidence of overt LOB or buckling. 2/4 DOE. Poor waveform with pulse ox showing 85% but fluctuated greatly throughout ambulatory bout on 8L supplemental HFNC.  Stairs            Wheelchair Mobility    Modified Rankin (Stroke Patients Only)       Balance Overall balance assessment: Mild deficits observed, not formally tested                                           Pertinent Vitals/Pain Pain Assessment Pain Assessment: No/denies pain    Home Living Family/patient expects to be discharged to:: Private residence Living Arrangements: Alone Available Help at Discharge: Family;Available PRN/intermittently Type of Home: House Home Access: Stairs to enter Entrance Stairs-Rails: Psychiatric nurse of Steps: 6   Home Layout: One level Home Equipment: Shower  seat;Other (comment) (O2 concentrator)      Prior Function Prior Level of Function : Independent/Modified Independent             Mobility Comments: no use of AD typically       Hand Dominance   Dominant Hand: Right    Extremity/Trunk Assessment   Upper Extremity Assessment Upper Extremity Assessment: Defer to OT evaluation    Lower  Extremity Assessment Lower Extremity Assessment: Generalized weakness       Communication   Communication: No difficulties  Cognition Arousal/Alertness: Awake/alert Behavior During Therapy: WFL for tasks assessed/performed Overall Cognitive Status: Within Functional Limits for tasks assessed                                          General Comments General comments (skin integrity, edema, etc.): SpO2 at rest fluctuating with poor waveform at one point 98% on 9L HFNC (as found in room) no dyspnea. Becomes dyspneic with movement. SpO2 quickly returns to mid 90s on avg after sitting.    Exercises     Assessment/Plan    PT Assessment Patient needs continued PT services  PT Problem List Decreased strength;Decreased activity tolerance;Decreased mobility;Cardiopulmonary status limiting activity       PT Treatment Interventions DME instruction;Gait training;Stair training;Functional mobility training;Therapeutic activities;Therapeutic exercise;Neuromuscular re-education;Balance training;Patient/family education    PT Goals (Current goals can be found in the Care Plan section)  Acute Rehab PT Goals Patient Stated Goal: get well PT Goal Formulation: With patient Time For Goal Achievement: 06/04/22 Potential to Achieve Goals: Good    Frequency Min 3X/week     Co-evaluation               AM-PAC PT "6 Clicks" Mobility  Outcome Measure Help needed turning from your back to your side while in a flat bed without using bedrails?: None Help needed moving from lying on your back to sitting on the side of a flat bed without using bedrails?: None Help needed moving to and from a bed to a chair (including a wheelchair)?: None Help needed standing up from a chair using your arms (e.g., wheelchair or bedside chair)?: None Help needed to walk in hospital room?: A Little Help needed climbing 3-5 steps with a railing? : A Little 6 Click Score: 22    End of Session  Equipment Utilized During Treatment: Oxygen Activity Tolerance: Patient tolerated treatment well Patient left: in bed;with call bell/phone within reach   PT Visit Diagnosis: Muscle weakness (generalized) (M62.81);Difficulty in walking, not elsewhere classified (R26.2)    Time: 8466-5993 PT Time Calculation (min) (ACUTE ONLY): 19 min   Charges:   PT Evaluation $PT Eval Low Complexity: [place], PT, DPT Physical Therapist Acute Rehabilitation Services [place] 05/21/2022, 4:37 PM

## 2022-05-21 NOTE — Discharge Instructions (Signed)

## 2022-05-21 NOTE — Progress Notes (Signed)
Pt taken off Heated HFNC per MD and placed on HFNC 13L. Pt tolerating well at this time, no increased WOB noted, vitals stable, RN at bedside, MD at bedside.     05/21/22 0820  Therapy Vitals  Pulse Rate 99  Resp 20  BP 129/81  Patient Position (if appropriate) Lying  MEWS Score/Color  MEWS Score 0  MEWS Score Color Green  Oxygen Therapy/Pulse Ox  O2 Device (S)  HFNC (salter)  O2 Therapy Oxygen humidified  O2 Flow Rate (L/min) 13 L/min  SpO2 96 %

## 2022-05-22 DIAGNOSIS — J441 Chronic obstructive pulmonary disease with (acute) exacerbation: Secondary | ICD-10-CM | POA: Diagnosis not present

## 2022-05-22 DIAGNOSIS — J9601 Acute respiratory failure with hypoxia: Secondary | ICD-10-CM | POA: Diagnosis not present

## 2022-05-22 LAB — MAGNESIUM: Magnesium: 2.1 mg/dL (ref 1.7–2.4)

## 2022-05-22 LAB — CBC WITH DIFFERENTIAL/PLATELET
Abs Immature Granulocytes: 0.05 10*3/uL (ref 0.00–0.07)
Basophils Absolute: 0 10*3/uL (ref 0.0–0.1)
Basophils Relative: 0 %
Eosinophils Absolute: 0 10*3/uL (ref 0.0–0.5)
Eosinophils Relative: 0 %
HCT: 40.9 % (ref 36.0–46.0)
Hemoglobin: 12.5 g/dL (ref 12.0–15.0)
Immature Granulocytes: 0 %
Lymphocytes Relative: 13 %
Lymphs Abs: 1.5 10*3/uL (ref 0.7–4.0)
MCH: 31.1 pg (ref 26.0–34.0)
MCHC: 30.6 g/dL (ref 30.0–36.0)
MCV: 101.7 fL — ABNORMAL HIGH (ref 80.0–100.0)
Monocytes Absolute: 0.9 10*3/uL (ref 0.1–1.0)
Monocytes Relative: 8 %
Neutro Abs: 9 10*3/uL — ABNORMAL HIGH (ref 1.7–7.7)
Neutrophils Relative %: 79 %
Platelets: 214 10*3/uL (ref 150–400)
RBC: 4.02 MIL/uL (ref 3.87–5.11)
RDW: 13.9 % (ref 11.5–15.5)
WBC: 11.5 10*3/uL — ABNORMAL HIGH (ref 4.0–10.5)
nRBC: 0 % (ref 0.0–0.2)

## 2022-05-22 LAB — BASIC METABOLIC PANEL
Anion gap: 9 (ref 5–15)
BUN: 14 mg/dL (ref 8–23)
CO2: 39 mmol/L — ABNORMAL HIGH (ref 22–32)
Calcium: 9.1 mg/dL (ref 8.9–10.3)
Chloride: 96 mmol/L — ABNORMAL LOW (ref 98–111)
Creatinine, Ser: 0.57 mg/dL (ref 0.44–1.00)
GFR, Estimated: >60 mL/min (ref >60)
Glucose, Bld: 93 mg/dL (ref 70–99)
Potassium: 4.2 mmol/L (ref 3.5–5.1)
Sodium: 144 mmol/L (ref 135–145)

## 2022-05-22 LAB — BRAIN NATRIURETIC PEPTIDE: B Natriuretic Peptide: 18.3 pg/mL (ref 0.0–100.0)

## 2022-05-22 LAB — C-REACTIVE PROTEIN: CRP: 3.1 mg/dL — ABNORMAL HIGH (ref <1.0)

## 2022-05-22 LAB — TSH: TSH: 0.299 u[IU]/mL — ABNORMAL LOW (ref 0.350–4.500)

## 2022-05-22 MED ORDER — AZITHROMYCIN 500 MG PO TABS
250.0000 mg | ORAL_TABLET | ORAL | Status: DC
  Administered 2022-05-24: 250 mg via ORAL
  Filled 2022-05-22 (×2): qty 1

## 2022-05-22 MED ORDER — FUROSEMIDE 10 MG/ML IJ SOLN
60.0000 mg | Freq: Once | INTRAMUSCULAR | Status: AC
  Administered 2022-05-22: 60 mg via INTRAVENOUS
  Filled 2022-05-22: qty 6

## 2022-05-22 MED ORDER — PREDNISONE 20 MG PO TABS
20.0000 mg | ORAL_TABLET | Freq: Every day | ORAL | Status: AC
  Administered 2022-05-23 – 2022-05-24 (×2): 20 mg via ORAL
  Filled 2022-05-22 (×2): qty 1

## 2022-05-22 MED ORDER — AZITHROMYCIN 500 MG PO TABS
250.0000 mg | ORAL_TABLET | Freq: Once | ORAL | Status: AC
  Administered 2022-05-22: 250 mg via ORAL

## 2022-05-22 NOTE — Evaluation (Signed)
Occupational Therapy Evaluation Patient Details Name: Caroline Greer MRN: [id number] DOB: [birth year] Today's Date: 05/22/2022   History of Present Illness Pt is a 66 y/o female admitted 12/27 with worsening shortness of breath x 1 day-found to have acute on chronic hypoxic respiratory failurePMH: COPD 4 L O2 at baseline, HTN, breast CA with radiation April-May.   Clinical Impression   Patient admitted for the diagnosis above.  PTA she lives alone and states she was Ind with ADL,iADL, and mobility.  Primary deficit is poor activity tolerance and DOE with activity.  Patient continues with SOB, but does not need any physical assist.  No significant OT needs identified in the acute setting, and no post acute OT indicated.       Recommendations for follow up therapy are one component of a multi-disciplinary discharge planning process, led by the attending physician.  Recommendations may be updated based on patient status, additional functional criteria and insurance authorization.   Follow Up Recommendations  No OT follow up     Assistance Recommended at Discharge PRN  Patient can return home with the following Assist for transportation    Functional Status Assessment  Patient has had a recent decline in their functional status and demonstrates the ability to make significant improvements in function in a reasonable and predictable amount of time.  Equipment Recommendations  None recommended by OT    Recommendations for Other Services       Precautions / Restrictions Precautions Precautions: None Precaution Comments: monitor O2 Restrictions Weight Bearing Restrictions: No      Mobility Bed Mobility Overal bed mobility: Modified Independent                  Transfers Overall transfer level: Modified independent Equipment used: None                      Balance Overall balance assessment: Mild deficits observed, not formally tested                                          ADL either performed or assessed with clinical judgement   ADL Overall ADL's : At baseline                                       General ADL Comments: Increased time and rest breaks     Vision Patient Visual Report: No change from baseline       Perception     Praxis      Pertinent Vitals/Pain Pain Assessment Pain Assessment: No/denies pain     Hand Dominance Right   Extremity/Trunk Assessment Upper Extremity Assessment Upper Extremity Assessment: Overall WFL for tasks assessed   Lower Extremity Assessment Lower Extremity Assessment: Defer to PT evaluation   Cervical / Trunk Assessment Cervical / Trunk Assessment: Normal   Communication Communication Communication: No difficulties   Cognition Arousal/Alertness: Awake/alert Behavior During Therapy: WFL for tasks assessed/performed Overall Cognitive Status: Within Functional Limits for tasks assessed                                       General Comments   O2 reading 88% in halls with poor pleth.  Exercises     Shoulder Instructions      Home Living Family/patient expects to be discharged to:: Private residence Living Arrangements: Alone Available Help at Discharge: Family;Available PRN/intermittently Type of Home: House Home Access: Stairs to enter [employer] of Steps: 6 Entrance Stairs-Rails: Right;Left Home Layout: One level     Bathroom Shower/Tub: Teacher, early years/pre: Standard Bathroom Accessibility: Yes How Accessible: Accessible via walker Home Equipment: Shower seat;Other (comment)   Additional Comments: asking for a portable tank for trips out      Prior Functioning/Environment Prior Level of Function : Independent/Modified Independent             Mobility Comments: no use of AD typically ADLs Comments: Independent with ADLs, IADLs, does drive but has not gotten out much d/t not having a  portable O2 tank. typically tub soaks but just bought a shower chair to use just in case        OT Problem List: Decreased activity tolerance      OT Treatment/Interventions:      OT Goals(Current goals can be found in the care plan section) Acute Rehab OT Goals Patient Stated Goal: Hoping to go home tomorrow OT Goal Formulation: With patient Time For Goal Achievement: 06/04/22 Potential to Achieve Goals: Good  OT Frequency:      Co-evaluation              AM-PAC OT "6 Clicks" Daily Activity     Outcome Measure Help from another person eating meals?: None Help from another person taking care of personal grooming?: None Help from another person toileting, which includes using toliet, bedpan, or urinal?: None Help from another person bathing (including washing, rinsing, drying)?: None Help from another person to put on and taking off regular upper body clothing?: None Help from another person to put on and taking off regular lower body clothing?: None 6 Click Score: 24   End of Session Equipment Utilized During Treatment: Oxygen Nurse Communication: Mobility status  Activity Tolerance: Patient tolerated treatment well Patient left: in chair;with call bell/phone within reach  OT Visit Diagnosis: Unsteadiness on feet (R26.81)                Time: 2409-7353 OT Time Calculation (min): 17 min Charges:  OT General Charges $OT Visit: 1 Visit OT Evaluation $OT Eval Moderate Complexity: 1 Mod  05/22/2022  RP, OTR/L  Acute Rehabilitation Services  Office:  [phone]   Metta Clines 05/22/2022, 2:55 PM

## 2022-05-22 NOTE — Progress Notes (Signed)
PROGRESS NOTE        PATIENT DETAILS Name: Caroline Greer Age: 66 y.o. Sex: female Date of Birth: 1955-09-21 Admit Date: 05/19/2022 Admitting Physician Marcelyn Bruins, MD [id number], [name], MD  Brief Summary: Patient is a 66 y.o.  female with history of COPD on home O2 (4 L at rest-6 L with activity), A-fib on Eliquis-presented to the hospital with worsening shortness of breath x 1 day-found to have acute on chronic hypoxic respiratory failure-initially requiring BiPAP but subsequently transition to HFNC.  Etiology of hypoxia unclear at this time.  See below for further details.  Significant events: 12/27>> presented with shortness of breath-severe hypoxemia-initially on BiPAP but then transition to [facility].  Admit to [facility]. 12/29>>transitioned from [facility] to salter  Significant studies: 12/27>> CTA chest: No PE, trace bilateral effusions, dependent bibasilar atelectasis. 12/28>> Limited echo with bubble study: Bubble study negative-no evidence of interatrial shunt.  Significant microbiology data: 12/27>> respiratory virus panel: Negative 12/27>> COVID/influenza/RSV: Negative  Procedures: None  Consults: PCCM  Subjective: Claims she feels better-she ambulated in the room-and thinks she was back to her usual baseline exertional dyspnea.  She was on heated high low-but was able to be transition to salter high flow today.    Objective: Vitals: Blood pressure 136/84, pulse 86, temperature 98.3 F (36.8 C), temperature source Oral, resp. rate 17, weight 74.5 kg, SpO2 92 %.   Exam: Gen Exam:Alert awake-not in any distress HEENT:atraumatic, normocephalic Chest: B/L clear to auscultation anteriorly CVS:S1S2 regular Abdomen:soft non tender, non distended Extremities:no edema Neurology: Non focal Skin: no rash  Pertinent Labs/Radiology:    Latest Ref Rng & Units 05/22/2022    2:23 AM 05/20/2022    5:00 AM 05/19/2022   12:14 PM  CBC  WBC 4.0 - 10.5  K/uL 11.5  10.4    Hemoglobin 12.0 - 15.0 g/dL 12.5  12.2  14.3   Hematocrit 36.0 - 46.0 % 40.9  39.6  42.0   Platelets 150 - 400 K/uL 214  193      Lab Results  Component Value Date   NA 144 05/22/2022   K 4.2 05/22/2022   CL 96 (L) 05/22/2022   CO2 39 (H) 05/22/2022      Assessment/Plan:  Acute on chronic hypoxic respiratory failure in a patient with underlying COPD likely due to acute on chronic diastolic CHF.  Stable CTA with no PE, on low-dose steroid taper for questionable COPD exacerbation, no wheezing, echo bubble study without any shunt.  At baseline she uses between 4 to 6 L nasal cannula oxygen at home currently on 10, with IV Lasix and diuresis hypoxia and oxygen need significantly improved, advance activity encouraged to sit in chair in daytime use I-S and flutter valve.  Continue to monitor.  Viral panel negative.  Appreciate PCCM input.  COPD No wheezing-does not appear to have an acute exacerbation Given severity of hypoxemia-being treated with bronchodilators and a course of tapering steroids to see if this helps improve her hypoxemia.  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  PAF Sinus rhythm Continue Eliquis/metoprolol  HTN BP stable Continue amlodipine/metoprolol  History of stage Ia right breast cancer-s/p lumpectomy (07/23/2021) and adjuvant radiation therapy (4/11-5/26/2023)-and antiestrogen therapy with exemestane CT chest without any evidence of radiation fibrosis to explain hypoxemia Continue outpatient follow-up with primary  oncologist-Dr. [name].   BMI: Estimated body mass index is 23.57 kg/m as calculated from the following:   Height as of 04/29/22: '5\' 10"'$  (1.778 m).   Weight as of this encounter: 74.5 kg.   Code status:   Code Status: Full Code   DVT Prophylaxis: apixaban (ELIQUIS) tablet 5 mg    Family Communication: None at  bedside  Disposition Plan: Status is: Inpatient Remains inpatient appropriate because: Severity of hypoxemia    Planned Discharge Destination:Home health   Diet: Diet Order             Diet regular Room service appropriate? Yes; Fluid consistency: Thin  Diet effective now                     Antimicrobial agents: Anti-infectives (From admission, onward)    None        MEDICATIONS: Scheduled Meds:  apixaban  5 mg Oral BID   exemestane  12.5 mg Oral QPC breakfast   metoprolol succinate  50 mg Oral Daily   mometasone-formoterol  2 puff Inhalation BID   predniSONE  40 mg Oral Q breakfast   sodium chloride flush  3 mL Intravenous Q12H   Continuous Infusions: PRN Meds:.acetaminophen **OR** acetaminophen, albuterol, ALPRAZolam, ipratropium, polyethylene glycol   I have personally reviewed following labs and imaging studies  LABORATORY DATA:  Recent Labs  Lab 05/19/22 1202 05/19/22 1214 05/20/22 0500 05/22/22 0223  WBC 14.6*  --  10.4 11.5*  HGB 13.2 14.3 12.2 12.5  HCT 41.6 42.0 39.6 40.9  PLT 192  --  193 214  MCV 101.2*  --  100.8* 101.7*  MCH 32.1  --  31.0 31.1  MCHC 31.7  --  30.8 30.6  RDW 14.0  --  13.8 13.9  LYMPHSABS  --   --   --  1.5  MONOABS  --   --   --  0.9  EOSABS  --   --   --  0.0  BASOSABS  --   --   --  0.0    Recent Labs  Lab 05/19/22 1202 05/19/22 1214 05/20/22 0500 05/21/22 0057 05/21/22 1613 05/22/22 0223  NA 140 138 138 142  --  144  K 4.0 4.0 4.0 3.7  --  4.2  CL 94*  --  93* 92*  --  96*  CO2 39*  --  37* 40*  --  39*  ANIONGAP 7  --  8 10  --  9  GLUCOSE 115*  --  143* 131*  --  93  BUN 8  --  16 18  --  14  CREATININE 0.49  --  0.70 0.67  --  0.57  AST 15  --  14*  --   --   --   ALT 16  --  15  --   --   --   ALKPHOS 40  --  39  --   --   --   BILITOT 1.1  --  0.7  --   --   --   ALBUMIN 3.3*  --  3.1*  --   --   --   CRP  --   --   --   --  4.6* 3.1*  PROCALCITON  --   --  0.16  --   --   --   BNP 27.2   --   --   --  19.2 18.3  MG  --   --   --   --   --  2.1  CALCIUM 8.9  --  8.9 9.1  --  9.1    RADIOLOGY STUDIES/RESULTS: No results found.   LOS: 3 days   Signature  -    Lala Lund M.D on 05/22/2022 at 10:09 AM   -  To page go to [email or web address]

## 2022-05-22 NOTE — Progress Notes (Signed)
NAME:  Caroline Greer, MRN:  [id number], DOB:  1956-04-24, LOS: 3 ADMISSION DATE:  05/19/2022, CONSULTATION DATE:  05/19/22 REFERRING MD:  Jeanell Sparrow, CHIEF COMPLAINT:  SOB   History of Present Illness:  66 year old woman with hx of COPD on chronic oxygen followed in our office who presents with rather sudden onset chest pressure and SOB unrelieved by home albuterol.  Some associated leg swelling.  No cough, fever, chills, sick contacts.  Has history of broken sternum which she thinks pain is from.   Also hx R breast Ca initially diagnosed 04/2021 (stage 1A, HER2 negative), had R lumpectomy and treated with radiation therapy completed 09/2021 and now on Exemestane.  Recent admit Sept for pleurisy found to have effusions. No good cause for her persistent hypoxemia found but did improve with diuresis.  Pertinent  Medical History   Past Medical History:  Diagnosis Date   Acute respiratory failure with hypoxia ([place]) 09/30/2018   Breast cancer ([place])    Constipation    COPD exacerbation ([place]) 01/05/2019   Depression with anxiety 09/30/2018   Dyspnea    Essential hypertension 09/30/2018   History of radiation therapy    Right breast- 09/01/21-10/16/21- Dr. Gery Pray   Hyperlipidemia 02/02/2022   Hypertension    Tobacco dependence due to cigarettes 09/30/2018   Significant Hospital Events: Including procedures, antibiotic start and stop dates in addition to other pertinent events   12/27 admit 05/20/2022 2D echo with bubble study with negative shunt  Interim History / Subjective:   Patient feels better on baseline home o2 supplementation   Objective   Blood pressure 136/84, pulse 86, temperature 98.3 F (36.8 C), temperature source Oral, resp. rate 17, weight 74.5 kg, SpO2 92 %.       No intake or output data in the 24 hours ending 05/22/22 0852 Filed Weights   05/21/22 1600  Weight: 74.5 kg    Examination: General: elderly fm resting in bed, no distress  HEENT: MMM, NCAT tracking   Neuro: alert following commands  CV: RRR, s1 s2 PULM: diminshed breaths bilaterally, not wheezing  GI: soft nt nd  GU: deferred  Extremities: no edema  Skin: no rash    Resolved Hospital Problem list   N/A  Assessment & Plan:   Acute on chronic hypoxemic hypercarbic respiratory failure Likely COPD, has failed to have OP PFTs completed  PAF on eliquis, metoprolol rate control  Hx of breast cancer post surgery and chemoradiation Dog at home  Cockatiel bird at home, will send HP blood work, although CT not suggestive  - both possible reasons for recurrent exacerbations - ?component of asthma with ABS EOS of 400 in September, she could potentially qualify for injection therapies in the future, but she is very leery of medications and my be a hard sell to get her to try it.  ?compliance with diuretics P: Patient known well to me.  Back on home 4L [place] this AM  Continue diuresis to maintain euvolemia  Will start azithromycin 223m MWF D/c on breztri 2 puff BID  Neb albuterol TID and PRN  I will arrange PFTs and repeat ECG to check QTC at next office visit  Discussed recs with Dr. SCandiss NorsePatient likely plans for DC tomorrow  Pulm will sign off at this time. Please see phone note for outpatient follow up arrangements    Best Practice (right click and "Reselect all SmartList Selections" daily)  Per primary  Labs   CBC: Recent Labs  Lab 05/19/22  1202 05/19/22 1214 05/20/22 0500 05/22/22 0223  WBC 14.6*  --  10.4 11.5*  NEUTROABS  --   --   --  9.0*  HGB 13.2 14.3 12.2 12.5  HCT 41.6 42.0 39.6 40.9  MCV 101.2*  --  100.8* 101.7*  PLT 192  --  193 426    Basic Metabolic Panel: Recent Labs  Lab 05/19/22 1202 05/19/22 1214 05/20/22 0500 05/21/22 0057 05/22/22 0223  NA 140 138 138 142 144  K 4.0 4.0 4.0 3.7 4.2  CL 94*  --  93* 92* 96*  CO2 39*  --  37* 40* 39*  GLUCOSE 115*  --  143* 131* 93  BUN 8  --  _0 CREATININE 0.49  --  0.70 0.67 0.57  CALCIUM 8.9   --  8.9 9.1 9.1  MG  --   --   --   --  2.1   GFR: Estimated Creatinine Clearance: 72.8 mL/min (by C-G formula based on SCr of 0.57 mg/dL). Recent Labs  Lab 05/19/22 1202 05/20/22 0500 05/22/22 0223  PROCALCITON  --  0.16  --   WBC 14.6* 10.4 11.5*    Liver Function Tests: Recent Labs  Lab 05/19/22 1202 05/20/22 0500  AST 15 14*  ALT 16 15  ALKPHOS 40 39  BILITOT 1.1 0.7  PROT 6.5 6.3*  ALBUMIN 3.3* 3.1*   No results for input(s): "LIPASE", "AMYLASE" in the last 168 hours. No results for input(s): "AMMONIA" in the last 168 hours.  ABG    Component Value Date/Time   PHART 7.327 (L) 10/02/2019 0832   PCO2ART 60.1 (H) 10/02/2019 0832   PO2ART 103 10/02/2019 0832   HCO3 40.6 (H) 05/19/2022 1214   TCO2 42 (H) 05/19/2022 1214   O2SAT 93 05/19/2022 1214     Coagulation Profile: No results for input(s): "INR", "PROTIME" in the last 168 hours.  Cardiac Enzymes: No results for input(s): "CKTOTAL", "CKMB", "CKMBINDEX", "TROPONINI" in the last 168 hours.  HbA1C: No results found for: "HGBA1C"  CBG: No results for input(s): "GLUCAP" in the last 168 hours.   Garner Nash, DO [facility] 05/22/2022 8:53 AM

## 2022-05-22 NOTE — Telephone Encounter (Signed)
PCCM:  Please schedule hospital follow up with APP (Cobb, NP has seen her before) or me. She will need PFTs prior to next office visit as well as an ECG completed prior to next office visit. Both can be complete in the office and please allow time for the patient to have these complete prior to the appts.   Garner Nash, DO [place] [facility] 05/22/2022 11:05 AM

## 2022-05-22 NOTE — Plan of Care (Signed)

## 2022-05-23 ENCOUNTER — Inpatient Hospital Stay (HOSPITAL_COMMUNITY): Payer: Medicare Other

## 2022-05-23 DIAGNOSIS — J441 Chronic obstructive pulmonary disease with (acute) exacerbation: Secondary | ICD-10-CM | POA: Diagnosis not present

## 2022-05-23 LAB — BASIC METABOLIC PANEL
Anion gap: 9 (ref 5–15)
BUN: 13 mg/dL (ref 8–23)
CO2: 40 mmol/L — ABNORMAL HIGH (ref 22–32)
Calcium: 8.8 mg/dL — ABNORMAL LOW (ref 8.9–10.3)
Chloride: 92 mmol/L — ABNORMAL LOW (ref 98–111)
Creatinine, Ser: 0.56 mg/dL (ref 0.44–1.00)
GFR, Estimated: >60 mL/min (ref >60)
Glucose, Bld: 97 mg/dL (ref 70–99)
Potassium: 4 mmol/L (ref 3.5–5.1)
Sodium: 141 mmol/L (ref 135–145)

## 2022-05-23 LAB — CBC WITH DIFFERENTIAL/PLATELET
Abs Immature Granulocytes: 0.03 10*3/uL (ref 0.00–0.07)
Basophils Absolute: 0 10*3/uL (ref 0.0–0.1)
Basophils Relative: 0 %
Eosinophils Absolute: 0.1 10*3/uL (ref 0.0–0.5)
Eosinophils Relative: 1 %
HCT: 38.2 % (ref 36.0–46.0)
Hemoglobin: 12.1 g/dL (ref 12.0–15.0)
Immature Granulocytes: 0 %
Lymphocytes Relative: 14 %
Lymphs Abs: 1.4 10*3/uL (ref 0.7–4.0)
MCH: 31.7 pg (ref 26.0–34.0)
MCHC: 31.7 g/dL (ref 30.0–36.0)
MCV: 100 fL (ref 80.0–100.0)
Monocytes Absolute: 0.8 10*3/uL (ref 0.1–1.0)
Monocytes Relative: 8 %
Neutro Abs: 8.1 10*3/uL — ABNORMAL HIGH (ref 1.7–7.7)
Neutrophils Relative %: 77 %
Platelets: 195 10*3/uL (ref 150–400)
RBC: 3.82 MIL/uL — ABNORMAL LOW (ref 3.87–5.11)
RDW: 13.7 % (ref 11.5–15.5)
WBC: 10.5 10*3/uL (ref 4.0–10.5)
nRBC: 0 % (ref 0.0–0.2)

## 2022-05-23 LAB — BRAIN NATRIURETIC PEPTIDE: B Natriuretic Peptide: 16.5 pg/mL (ref 0.0–100.0)

## 2022-05-23 LAB — MAGNESIUM: Magnesium: 2.2 mg/dL (ref 1.7–2.4)

## 2022-05-23 LAB — C-REACTIVE PROTEIN: CRP: 1.6 mg/dL — ABNORMAL HIGH (ref <1.0)

## 2022-05-23 LAB — T4, FREE: Free T4: 0.82 ng/dL (ref 0.61–1.12)

## 2022-05-23 LAB — TSH: TSH: 0.268 u[IU]/mL — ABNORMAL LOW (ref 0.350–4.500)

## 2022-05-23 MED ORDER — FUROSEMIDE 10 MG/ML IJ SOLN
60.0000 mg | Freq: Once | INTRAMUSCULAR | Status: AC
  Administered 2022-05-23: 60 mg via INTRAVENOUS
  Filled 2022-05-23: qty 6

## 2022-05-23 NOTE — Progress Notes (Signed)
PROGRESS NOTE        PATIENT DETAILS Name: Caroline Greer Age: 66 y.o. Sex: female Date of Birth: 02/04/1956 Admit Date: 05/19/2022 Admitting Physician Marcelyn Bruins, MD [facility]:[id number], [name], MD  Brief Summary: Patient is a 66 y.o.  female with history of COPD on home O2 (4 L at rest-6 L with activity), A-fib on Eliquis-presented to the hospital with worsening shortness of breath x 1 day-found to have acute on chronic hypoxic respiratory failure-initially requiring BiPAP but subsequently transition to [facility].  Etiology of hypoxia unclear at this time.  See below for further details.  Significant events: 12/27>> presented with shortness of breath-severe hypoxemia-initially on BiPAP but then transition to [facility].  Admit to [facility]. 12/29>>transitioned from [facility] to [facility]  Significant studies: 12/27>> CTA chest: No PE, trace bilateral effusions, dependent bibasilar atelectasis. 12/28>> Limited echo with bubble study: Bubble study negative-no evidence of interatrial shunt.  Significant microbiology data: 12/27>> respiratory virus panel: Negative 12/27>> COVID/influenza/RSV: Negative  Procedures: None  Consults: PCCM  Subjective:   Patient in bed, appears comfortable, denies any headache, no fever, no chest pain or pressure, no shortness of breath , no abdominal pain. No new focal weakness.   Objective: Vitals: Blood pressure (!) 149/89, pulse 93, temperature 98.4 F (36.9 C), temperature source Oral, resp. rate 20, weight 74.5 kg, SpO2 90 %.   Exam:  Awake Alert, No new F.N deficits, Normal affect [place].AT,PERRAL Supple Neck, No JVD,   Symmetrical Chest wall movement, Good air movement bilaterally, few rales RRR,No Gallops, Rubs or new Murmurs,  +ve B.Sounds, Abd Soft, No tenderness,   No Cyanosis, Clubbing or edema    Assessment/Plan:  Acute on chronic hypoxic respiratory failure in a patient with underlying COPD likely due to acute on chronic  diastolic CHF.  Stable CTA with no PE, on low-dose steroid taper for questionable COPD exacerbation, no wheezing, echo bubble study without any shunt.  At baseline she uses between 4 to 6 L nasal cannula oxygen at home currently on 6-7 L, with IV Lasix and diuresis hypoxia and oxygen need significantly improved, advance activity encouraged to sit in chair in daytime use I-S and flutter valve.  Continue to monitor.  Viral panel negative.  Appreciate PCCM input.  COPD No wheezing-does not appear to have an acute exacerbation Given severity of hypoxemia-being treated with bronchodilators and a course of tapering steroids to see if this helps improve her hypoxemia.  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  PAF Sinus rhythm Continue Eliquis/metoprolol  HTN BP stable Continue amlodipine/metoprolol  History of stage Ia right breast cancer-s/p lumpectomy (07/23/2021) and adjuvant radiation therapy (4/11-5/26/2023)-and antiestrogen therapy with exemestane CT chest without any evidence of radiation fibrosis to explain hypoxemia Continue outpatient follow-up with primary oncologist-Dr. [name].  Sick euthyroid syndrome with slightly suppressed TSH.  Stable free T4.  Repeat TSH in 4 to 6 weeks by PCP.    BMI: Estimated body mass index is 23.57 kg/m as calculated from the following:   Height as of 04/29/22: '5\' 10"'$  (1.778 m).   Weight as of this encounter: 74.5 kg.   Code status:   Code Status: Full Code   DVT Prophylaxis: apixaban (ELIQUIS) tablet 5 mg    Family Communication: None at bedside  Disposition Plan: Status is: Inpatient Remains inpatient appropriate because: Severity  of hypoxemia    Planned Discharge Destination:Home health   Diet: Diet Order             Diet regular Room service appropriate? Yes; Fluid consistency: Thin  Diet effective now                      Antimicrobial agents: Anti-infectives (From admission, onward)    Start     Dose/Rate Route Frequency Ordered Stop   05/22/22 1300  azithromycin (ZITHROMAX) tablet 250 mg        250 mg Oral  Once 05/22/22 1223 05/22/22 1231   05/22/22 1145  azithromycin (ZITHROMAX) tablet 250 mg        250 mg Oral Every M-W-F 05/22/22 1047          MEDICATIONS: Scheduled Meds:  apixaban  5 mg Oral BID   azithromycin  250 mg Oral Q M,W,F   exemestane  12.5 mg Oral QPC breakfast   metoprolol succinate  50 mg Oral Daily   mometasone-formoterol  2 puff Inhalation BID   predniSONE  20 mg Oral Q breakfast   sodium chloride flush  3 mL Intravenous Q12H   Continuous Infusions: PRN Meds:.acetaminophen **OR** acetaminophen, albuterol, ALPRAZolam, ipratropium, polyethylene glycol   I have personally reviewed following labs and imaging studies  LABORATORY DATA:  Recent Labs  Lab 05/19/22 1202 05/19/22 1214 05/20/22 0500 05/22/22 0223 05/23/22 0302  WBC 14.6*  --  10.4 11.5* 10.5  HGB 13.2 14.3 12.2 12.5 12.1  HCT 41.6 42.0 39.6 40.9 38.2  PLT 192  --  193 214 195  MCV 101.2*  --  100.8* 101.7* 100.0  MCH 32.1  --  31.0 31.1 31.7  MCHC 31.7  --  30.8 30.6 31.7  RDW 14.0  --  13.8 13.9 13.7  LYMPHSABS  --   --   --  1.5 1.4  MONOABS  --   --   --  0.9 0.8  EOSABS  --   --   --  0.0 0.1  BASOSABS  --   --   --  0.0 0.0    Recent Labs  Lab 05/19/22 1202 05/19/22 1214 05/20/22 0500 05/21/22 0057 05/21/22 1613 05/22/22 0222 05/22/22 0223 05/23/22 0302  NA 140 138 138 142  --   --  144 141  K 4.0 4.0 4.0 3.7  --   --  4.2 4.0  CL 94*  --  93* 92*  --   --  96* 92*  CO2 39*  --  37* 40*  --   --  39* 40*  ANIONGAP 7  --  8 10  --   --  9 9  GLUCOSE 115*  --  143* 131*  --   --  93 97  BUN 8  --  16 18  --   --  14 13  CREATININE 0.49  --  0.70 0.67  --   --  0.57 0.56  AST 15  --  14*  --   --   --   --   --   ALT 16  --  15  --   --   --   --   --   ALKPHOS 40  --  39  --    --   --   --   --   BILITOT 1.1  --  0.7  --   --   --   --   --  ALBUMIN 3.3*  --  3.1*  --   --   --   --   --   CRP  --   --   --   --  4.6*  --  3.1* 1.6*  PROCALCITON  --   --  0.16  --   --   --   --   --   TSH  --   --   --   --   --  0.299*  --  0.268*  BNP 27.2  --   --   --  19.2  --  18.3 16.5  MG  --   --   --   --   --   --  2.1 2.2  CALCIUM 8.9  --  8.9 9.1  --   --  9.1 8.8*    RADIOLOGY STUDIES/RESULTS: DG Chest Port 1 View  Result Date: 05/23/2022 CLINICAL DATA:  66 year old female history of shortness of breath. EXAM: PORTABLE CHEST 1 VIEW COMPARISON:  Chest x-ray 05/19/2022. FINDINGS: No acute consolidative airspace disease. No pleural effusions. Diffuse interstitial prominence and widespread peribronchial cuffing, similar to prior examinations, likely reflective of chronic bronchitis. No pneumothorax. No evidence of pulmonary edema. Heart size is normal. Upper mediastinal contours are within normal limits. Atherosclerotic calcifications in the thoracic aorta. Surgical clips project over the lateral aspect of the right breast, likely from prior lumpectomy and lymph node dissection. IMPRESSION: 1. Changes in the lungs suggesting chronic bronchitis, similar to prior studies, as above. No new acute findings. 2. Aortic atherosclerosis. Electronically Signed   By: Vinnie Langton M.D.   On: 05/23/2022 06:10     LOS: 4 days   Signature  -    Lala Lund M.D on 05/23/2022 at 9:39 AM   -  To page go to [email or web address]

## 2022-05-23 NOTE — Plan of Care (Signed)

## 2022-05-24 DIAGNOSIS — J441 Chronic obstructive pulmonary disease with (acute) exacerbation: Secondary | ICD-10-CM | POA: Diagnosis not present

## 2022-05-24 LAB — CBC WITH DIFFERENTIAL/PLATELET
Abs Immature Granulocytes: 0.03 10*3/uL (ref 0.00–0.07)
Basophils Absolute: 0 10*3/uL (ref 0.0–0.1)
Basophils Relative: 0 %
Eosinophils Absolute: 0.2 10*3/uL (ref 0.0–0.5)
Eosinophils Relative: 2 %
HCT: 40 % (ref 36.0–46.0)
Hemoglobin: 12.4 g/dL (ref 12.0–15.0)
Immature Granulocytes: 0 %
Lymphocytes Relative: 17 %
Lymphs Abs: 1.7 10*3/uL (ref 0.7–4.0)
MCH: 31.2 pg (ref 26.0–34.0)
MCHC: 31 g/dL (ref 30.0–36.0)
MCV: 100.8 fL — ABNORMAL HIGH (ref 80.0–100.0)
Monocytes Absolute: 0.7 10*3/uL (ref 0.1–1.0)
Monocytes Relative: 7 %
Neutro Abs: 7.5 10*3/uL (ref 1.7–7.7)
Neutrophils Relative %: 74 %
Platelets: 220 10*3/uL (ref 150–400)
RBC: 3.97 MIL/uL (ref 3.87–5.11)
RDW: 13.7 % (ref 11.5–15.5)
WBC: 10.1 10*3/uL (ref 4.0–10.5)
nRBC: 0 % (ref 0.0–0.2)

## 2022-05-24 LAB — BRAIN NATRIURETIC PEPTIDE: B Natriuretic Peptide: 13.5 pg/mL (ref 0.0–100.0)

## 2022-05-24 LAB — BASIC METABOLIC PANEL
Anion gap: 10 (ref 5–15)
BUN: 12 mg/dL (ref 8–23)
CO2: 40 mmol/L — ABNORMAL HIGH (ref 22–32)
Calcium: 9.1 mg/dL (ref 8.9–10.3)
Chloride: 90 mmol/L — ABNORMAL LOW (ref 98–111)
Creatinine, Ser: 0.62 mg/dL (ref 0.44–1.00)
GFR, Estimated: >60 mL/min (ref >60)
Glucose, Bld: 95 mg/dL (ref 70–99)
Potassium: 4 mmol/L (ref 3.5–5.1)
Sodium: 140 mmol/L (ref 135–145)

## 2022-05-24 LAB — MAGNESIUM: Magnesium: 2.1 mg/dL (ref 1.7–2.4)

## 2022-05-24 LAB — C-REACTIVE PROTEIN: CRP: 0.9 mg/dL (ref <1.0)

## 2022-05-24 MED ORDER — AZITHROMYCIN 250 MG PO TABS: 250.0000 mg | ORAL_TABLET | ORAL | 0 refills | Status: DC

## 2022-05-24 MED ORDER — FUROSEMIDE 40 MG PO TABS: 20.0000 mg | ORAL_TABLET | Freq: Every day | ORAL | 0 refills | Status: DC

## 2022-05-24 MED ORDER — PREDNISONE 5 MG PO TABS: ORAL_TABLET | ORAL | 0 refills | Status: DC

## 2022-05-24 MED ORDER — PANTOPRAZOLE SODIUM 40 MG PO TBEC: 40.0000 mg | DELAYED_RELEASE_TABLET | Freq: Every day | ORAL | 0 refills | Status: DC

## 2022-05-24 MED ORDER — FUROSEMIDE 10 MG/ML IJ SOLN
40.0000 mg | Freq: Once | INTRAMUSCULAR | Status: AC
  Administered 2022-05-24: 40 mg via INTRAVENOUS
  Filled 2022-05-24: qty 4

## 2022-05-24 NOTE — Progress Notes (Signed)
05/24/21 Patient was discharged before rcv'g copy of IMM Letter, patient rcv'd initial letter. Copy will be mailed to the address on file.

## 2022-05-24 NOTE — Care Management Important Message (Signed)
Important Message  Patient Details  Name: Caroline Greer MRN: [id number] Date of Birth: January 12, 1956   Medicare Important Message Given:  Other (see comment)     Hannah Beat 05/24/2022, 3:40 PM

## 2022-05-24 NOTE — TOC Transition Note (Signed)
Transition of Care [facility]) - CM/SW Discharge Note   Patient Details  Name: Caroline Greer MRN: [id number] Date of Birth: 22-Aug-1955  Transition of Care [facility]) CM/SW Contact:  Cyndi Bender, RN Phone Number: 05/24/2022, 11:40 AM   Clinical Narrative:     Patient stable for discharge. Ordered patient portable tank for discharge.  No other TOC needs at this time  Final next level of care: Home/Self Care Barriers to Discharge: Barriers Resolved   Patient Goals and CMS Choice  Return home    Discharge Placement    home                     Discharge Plan and Services Additional resources added to the After Visit Summary for                  DME Arranged: Oxygen DME Agency: [employer] Date DME Agency Contacted: 05/24/22 Time DME Agency Contacted: [phone] Representative spoke with at DME Agency: [place] ([place]) Interventions SDOH Screenings   Food Insecurity: No Food Insecurity (02/03/2022)  Housing: Low Risk  (02/03/2022)  Transportation Needs: No Transportation Needs (02/03/2022)  Utilities: Not At Risk (02/03/2022)  Tobacco Use: Medium Risk (04/29/2022)     Readmission Risk Interventions    05/24/2022   11:38 AM 02/10/2022    1:28 PM 01/13/2022    1:08 PM  Readmission Risk Prevention Plan  Transportation Screening Complete Complete Complete  PCP or Specialist Appt within 5-7 Days Complete  Complete  PCP or Specialist Appt within 3-5 Days  Complete   Home Care Screening Complete  Complete  Medication Review (RN CM) Complete  Complete  HRI or [place]  Complete   Social Work Consult for [place] Planning/Counseling  Complete   Palliative Care Screening  Not Applicable   Medication Review [employer])  Complete

## 2022-05-24 NOTE — Plan of Care (Signed)

## 2022-05-24 NOTE — Discharge Summary (Signed)
Caroline Greer VEL:[id number] DOB: Aug 03, 1955 DOA: 05/19/2022  PCP: [name], MD  Admit date: 05/19/2022  Discharge date: 05/24/2022  Admitted From: Home   Disposition:  Home   Recommendations for Outpatient Follow-up:   Follow up with PCP in 1-2 weeks  PCP Please obtain BMP/CBC, 2 view CXR in 1week,  (see Discharge instructions)   PCP Please follow up on the following pending results:    Home Health: None   Equipment/Devices: walker  Consultations: Pulm Discharge Condition: Stable    CODE STATUS: Full    Diet Recommendation: Heart Healthy     Chief Complaint  Patient presents with   Respiratory Distress     Brief history of present illness from the day of admission and additional interim summary    67 y.o.  female with history of COPD on home O2 (4 L at rest-6 L with activity), A-fib on Eliquis-presented to the hospital with worsening shortness of breath x 1 day-found to have acute on chronic hypoxic respiratory failure-initially requiring BiPAP but subsequently transition to [facility].  Etiology of hypoxia unclear at this time.  See below for further details.   Significant events: 12/27>> presented with shortness of breath-severe hypoxemia-initially on BiPAP but then transition to [facility].  Admit to [facility]. 12/29>>transitioned from [facility] to [facility]   Significant studies: 12/27>> CTA chest: No PE, trace bilateral effusions, dependent bibasilar atelectasis. 12/28>> Limited echo with bubble study: Bubble study negative-no evidence of interatrial shunt.   Significant microbiology data: 12/27>> respiratory virus panel: Negative 12/27>> COVID/influenza/RSV: Negative                                                                 Hospital Course   Acute on chronic hypoxic respiratory failure in a patient with  underlying COPD likely due to acute on chronic diastolic CHF.   Stable CTA with no PE, on low-dose steroid taper for questionable COPD exacerbation, no wheezing, echo bubble study without any shunt.  At baseline she uses between 4 to 6 L nasal cannula oxygen at home currently on 6 L, with IV Lasix and diuresis hypoxia and oxygen need significantly improved, seen by pulmonary Case discussed with her pulmonologist Dr. [name] today, she will be discharged home on a gentle steroid taper, scheduled low-dose Lasix, Monday Wednesday Friday azithromycin as recommended by her pulmonologist along with home oxygen and a rolling walker.  Requested to follow-up with her PCP and pulmonologist within a week of discharge.  She is adamant on going home today.   COPD No wheezing-does not appear to have an acute exacerbation Continue home treatment as before, she already has samples of Breztri which she will continue to use, discussed with her pulmonologist Dr. [name] today.   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   PAF Sinus rhythm Continue Eliquis/metoprolol   HTN BP stable Continue amlodipine/metoprolol   History of stage Ia right breast cancer-s/p lumpectomy (07/23/2021) and adjuvant radiation therapy (4/11-5/26/2023)-and antiestrogen therapy with exemestane CT chest without any evidence of radiation fibrosis to explain hypoxemia Continue outpatient follow-up with primary oncologist-Dr. [name].   Sick euthyroid syndrome with slightly suppressed TSH.  Stable free T4.  Repeat TSH in 4 to 6 weeks by PCP.     Discharge diagnosis     Active Problems:   COPD exacerbation (HCC)   Pleural effusion, bilateral   Essential hypertension   Depression with anxiety   Atrial fibrillation (HCC)   GERD (gastroesophageal reflux disease)   Hyperlipidemia   Acute on chronic respiratory failure with hypoxia  [facility])    Discharge instructions    Discharge Instructions     Diet - low sodium heart healthy   Complete by: As directed    Discharge instructions   Complete by: As directed    Follow with Primary MD [name], MD in 7 days   Get CBC, CMP, TSH, 2 view Chest X ray -  checked next visit with your primary MD   Activity: As tolerated with Full fall precautions use walker/cane & assistance as needed  Disposition Home    Diet: Heart Healthy    Special Instructions: If you have smoked or chewed Tobacco  in the last 2 yrs please stop smoking, stop any regular Alcohol  and or any Recreational drug use.  On your next visit with your primary care physician please Get Medicines reviewed and adjusted.  Please request your Prim.MD to go over all Hospital Tests and Procedure/Radiological results at the follow up, please get all Hospital records sent to your Prim MD by signing hospital release before you go home.  If you experience worsening of your admission symptoms, develop shortness of breath, life threatening emergency, suicidal or homicidal thoughts you must seek medical attention immediately by calling 911 or calling your MD immediately  if symptoms less severe.  You Must read complete instructions/literature along with all the possible adverse reactions/side effects for all the Medicines you take and that have been prescribed to you. Take any new Medicines after you have completely understood and accpet all the possible adverse reactions/side effects.   Increase activity slowly   Complete by: As directed        Discharge Medications   Allergies as of 05/24/2022       Reactions   Crab [shellfish Allergy] Anaphylaxis, Swelling   Dungeness crab   Erythromycin Other (See Comments)   Convulsion, Tolerated Azithro 01/2022   Hydrocodone Nausea And Vomiting   Severe vomiting         Medication List     STOP taking these medications    doxycycline 100 MG tablet Commonly known  as: VIBRA-TABS   predniSONE 10 MG tablet Commonly known as: DELTASONE Replaced by: predniSONE 5 MG tablet       TAKE these medications    acetaminophen 500 MG tablet Commonly known as: TYLENOL Take 1,000 mg by mouth daily as needed (pain).   ALPRAZolam 0.5 MG tablet Commonly known as: XANAX Take 0.5 mg by mouth at bedtime as needed for sleep.   amLODipine 5 MG tablet Commonly known as: NORVASC Take 1 tablet (5 mg total) by mouth daily.   apixaban 5 MG Tabs tablet Commonly known as: ELIQUIS Take 1 tablet (5 mg total) by mouth 2 (two) times daily.  azithromycin 250 MG tablet Commonly known as: ZITHROMAX Take 1 tablet (250 mg total) by mouth every Monday, Wednesday, and Friday. Start taking on: May 26, 2022   budesonide-formoterol 160-4.5 MCG/ACT inhaler Commonly known as: SYMBICORT Inhale 2 puffs into the lungs 2 (two) times daily.   cyanocobalamin 500 MCG tablet Commonly known as: VITAMIN B12 Take 500 mcg by mouth daily.   exemestane 25 MG tablet Commonly known as: AROMASIN Take 0.5 tablets (12.5 mg total) by mouth daily after breakfast.   fluticasone 50 MCG/ACT nasal spray Commonly known as: FLONASE Place 2 sprays into both nostrils 2 (two) times daily.   furosemide 40 MG tablet Commonly known as: LASIX Take 0.5 tablets (20 mg total) by mouth daily. Take 1 tablet daily x 2 days, then as needed. What changed:  how much to take when to take this reasons to take this   guaiFENesin 600 MG 12 hr tablet Commonly known as: MUCINEX Take 600 mg by mouth 2 (two) times daily as needed for to loosen phlegm.   MAGnesium-Oxide 400 (240 Mg) MG tablet Generic drug: magnesium oxide Take 1 tablet by mouth daily.   metoprolol succinate 50 MG 24 hr tablet Commonly known as: TOPROL-XL Take 1 tablet (50 mg total) by mouth daily. Take with or immediately following a meal.   pantoprazole 40 MG tablet Commonly known as: PROTONIX Take 1 tablet (40 mg total) by mouth  daily at 6 (six) AM.   predniSONE 5 MG tablet Commonly known as: DELTASONE Label  & dispense according to the schedule below. take 8 Pills PO for 3 days, 6 Pills PO for 3 days, 4 Pills PO for 3 days, 2 Pills PO for 3 days, 1 Pills PO for 3 days, 1/2 Pill  PO for 3 days then STOP. Total 65 pills. Replaces: predniSONE 10 MG tablet   ProAir HFA 108 (90 Base) MCG/ACT inhaler Generic drug: albuterol Inhale 2 puffs into the lungs every 6 (six) hours as needed for wheezing or shortness of breath. 90 day supply   albuterol (2.5 MG/3ML) 0.083% nebulizer solution Commonly known as: PROVENTIL Take 3 mLs (2.5 mg total) by nebulization every 6 (six) hours as needed for wheezing or shortness of breath.   Vitamin D3 25 MCG (1000 UT) Caps Take 1,000 Units by mouth daily.   Vitamin E Skin Oil Apply 1 Application topically daily.               Durable Medical Equipment  (From admission, onward)           Start     Ordered   05/24/22 0832  For home use only DME Walker rolling  Once       Comments: 5 wheel  Question Answer Comment  Walker: With [place] Wheels   Patient needs a walker to treat with the following condition Weakness      05/24/22 0831             Follow-up Information     [name], MD. Schedule an appointment as soon as possible for a visit in 1 week(s).   Specialty: Internal Medicine Why: Also follow-up with your pulmonologist and also with your oncologist Dr. [name] within a week. Contact information: [address] [place] [address] [phone]         Garner Nash, DO. Schedule an appointment as soon as possible for a visit in 1 week(s).   Specialty: Pulmonary Disease Contact information: [place]  [phone]                 Major procedures and Radiology Reports - PLEASE review detailed and final reports thoroughly  -      DG Chest [facility] 1 View  Result Date: 05/23/2022 CLINICAL  DATA:  67 year old female history of shortness of breath. EXAM: PORTABLE CHEST 1 VIEW COMPARISON:  Chest x-ray 05/19/2022. FINDINGS: No acute consolidative airspace disease. No pleural effusions. Diffuse interstitial prominence and widespread peribronchial cuffing, similar to prior examinations, likely reflective of chronic bronchitis. No pneumothorax. No evidence of pulmonary edema. Heart size is normal. Upper mediastinal contours are within normal limits. Atherosclerotic calcifications in the thoracic aorta. Surgical clips project over the lateral aspect of the right breast, likely from prior lumpectomy and lymph node dissection. IMPRESSION: 1. Changes in the lungs suggesting chronic bronchitis, similar to prior studies, as above. No new acute findings. 2. Aortic atherosclerosis. Electronically Signed   By: Vinnie Langton M.D.   On: 05/23/2022 06:10   ECHOCARDIOGRAM LIMITED BUBBLE STUDY  Result Date: 05/20/2022    ECHOCARDIOGRAM LIMITED REPORT   Patient Name:   Caroline Greer Date of Exam: 05/20/2022 Medical Rec #:  [id number]     Height:       70.0 in Accession #:    [id number]    Weight:       164.2 lb Date of Birth:  September 10, 1955     BSA:          1.919 m Patient Age:    67 years      BP:           136/91 mmHg Patient Gender: F             HR:           100 bpm. Exam Location:  Inpatient Procedure: Limited Echo and Saline Contrast Bubble Study Indications:    Hypoxia, ?ASD  History:        Patient has prior history of Echocardiogram examinations, most                 recent 02/02/2022. COPD, Arrythmias:Atrial Fibrillation; Risk                 Factors:Hypertension and Dyslipidemia.  Sonographer:    Raquel Sarna Senior RDCS Referring Phys: [id number] Candee Furbish  Sonographer Comments: Limited bubble to evaluate for ASD. IMPRESSIONS  1. Agitated saline contrast bubble study was negative, with no evidence of any interatrial shunt.  2. The left ventricle has normal function. FINDINGS  Left Ventricle: The left ventricle  has normal function. IAS/Shunts: Agitated saline contrast was given intravenously to evaluate for intracardiac shunting. Agitated saline contrast bubble study was negative, with no evidence of any interatrial shunt. Godfrey Pick Tobb DO Electronically signed by Berniece Salines DO Signature Date/Time: 05/20/2022/1:17:46 PM    Final    CT Angio Chest Pulmonary Embolism (PE) W or WO Contrast  Result Date: 05/19/2022 CLINICAL DATA:  Pulmonary embolism (PE) suspected, high prob. Respiratory distress EXAM: CT ANGIOGRAPHY CHEST WITH CONTRAST TECHNIQUE: Multidetector CT imaging of the chest was performed using the standard protocol during bolus administration of intravenous contrast. Multiplanar CT image reconstructions and MIPs were obtained to evaluate the vascular anatomy. RADIATION DOSE REDUCTION: This exam was performed according to the departmental dose-optimization program which includes automated exposure control, adjustment of the mA and/or kV according to patient size and/or use of iterative reconstruction technique. CONTRAST:  53m OMNIPAQUE IOHEXOL 350 MG/ML SOLN COMPARISON:  02/02/2022 FINDINGS:  Cardiovascular: No filling defects in the pulmonary arteries to suggest pulmonary emboli. Heart is normal size. Aorta is normal caliber. Moderate coronary artery and aortic calcifications. Mediastinum/Nodes: Prominent mediastinal and bilateral hilar lymph nodes, none pathologically enlarged. These are likely reactive and are stable. Trachea and esophagus are unremarkable. Thyroid unremarkable. Lungs/Pleura: Trace bilateral pleural effusions, slightly decreased since prior study. Dependent bibasilar atelectasis. Upper Abdomen: No acute findings Musculoskeletal: Chest wall soft tissues are unremarkable. No acute bony abnormality. Review of the MIP images confirms the above findings. IMPRESSION: No evidence of pulmonary embolus. Trace bilateral effusions, dependent bibasilar atelectasis. Coronary artery disease. Aortic  Atherosclerosis (ICD10-I70.0). Electronically Signed   By: Rolm Baptise M.D.   On: 05/19/2022 18:00   DG Chest Port 1 View  Result Date: 05/19/2022 CLINICAL DATA:  Shortness of breath. Respiratory distress; new onset. EXAM: PORTABLE CHEST 1 VIEW COMPARISON:  Radiographs 04/29/2022 and 02/06/2022.  CT 02/02/2022. FINDINGS: 1237 hours. The heart size and mediastinal contours are stable. The lungs are hyperinflated but clear. No pneumothorax or significant pleural effusion demonstrated. There are surgical clips in the right axilla. No acute osseous findings are evident. Telemetry leads overlie the chest. IMPRESSION: Chronic obstructive pulmonary disease. No acute cardiopulmonary process. Electronically Signed   By: Richardean Sale M.D.   On: 05/19/2022 12:51   DG Chest 2 View  Result Date: 04/29/2022 CLINICAL DATA:  COPD exacerbation.  Shortness of breath.  Hypoxia. EXAM: CHEST - 2 VIEW COMPARISON:  Chest radiograph 02/06/2022 FINDINGS: The cardiomediastinal silhouette is unchanged with normal heart size. Aortic atherosclerosis is noted. The lungs are hyperinflated with peribronchial thickening. No focal airspace consolidation, overt pulmonary edema, pleural effusion, or pneumothorax is identified. Right axillary surgical clips are noted. No acute osseous abnormality is seen. IMPRESSION: Bronchitic changes/COPD.  No evidence of edema or pneumonia. Electronically Signed   By: Logan Bores M.D.   On: 04/29/2022 15:56    Today   Subjective    Caroline Greer today has no headache,no chest abdominal pain,no new weakness tingling or numbness, feels much Greer wants to go home today.    Objective   Blood pressure 131/90, pulse 88, temperature 98.1 F (36.7 C), temperature source Oral, resp. rate (!) 23, weight 74.5 kg, SpO2 90 %.  No intake or output data in the 24 hours ending 05/24/22 0832  Exam  Awake Alert, No new F.N deficits,    [place].AT,PERRAL Supple Neck,   Symmetrical Chest wall movement,  Good air movement bilaterally, CTAB RRR,No Gallops,   +ve B.Sounds, Abd Soft, Non tender,  No Cyanosis, Clubbing or edema    Data Review   Recent Labs  Lab 05/19/22 1202 05/19/22 1214 05/20/22 0500 05/22/22 0223 05/23/22 0302 05/24/22 0348  WBC 14.6*  --  10.4 11.5* 10.5 10.1  HGB 13.2 14.3 12.2 12.5 12.1 12.4  HCT 41.6 42.0 39.6 40.9 38.2 40.0  PLT 192  --  193 214 195 220  MCV 101.2*  --  100.8* 101.7* 100.0 100.8*  MCH 32.1  --  31.0 31.1 31.7 31.2  MCHC 31.7  --  30.8 30.6 31.7 31.0  RDW 14.0  --  13.8 13.9 13.7 13.7  LYMPHSABS  --   --   --  1.5 1.4 1.7  MONOABS  --   --   --  0.9 0.8 0.7  EOSABS  --   --   --  0.0 0.1 0.2  BASOSABS  --   --   --  0.0 0.0 0.0    Recent  Labs  Lab 05/19/22 1202 05/19/22 1214 05/20/22 0500 05/21/22 0057 05/21/22 1613 05/22/22 0222 05/22/22 0223 05/23/22 0302 05/24/22 0348  NA 140   < > 138 142  --   --  144 141 140  K 4.0   < > 4.0 3.7  --   --  4.2 4.0 4.0  CL 94*  --  93* 92*  --   --  96* 92* 90*  CO2 39*  --  37* 40*  --   --  39* 40* 40*  ANIONGAP 7  --  8 10  --   --  '9 9 10  '$ GLUCOSE 115*  --  143* 131*  --   --  93 97 95  BUN 8  --  16 18  --   --  '14 13 12  '$ CREATININE 0.49  --  0.70 0.67  --   --  0.57 0.56 0.62  AST 15  --  14*  --   --   --   --   --   --   ALT 16  --  15  --   --   --   --   --   --   ALKPHOS 40  --  39  --   --   --   --   --   --   BILITOT 1.1  --  0.7  --   --   --   --   --   --   ALBUMIN 3.3*  --  3.1*  --   --   --   --   --   --   CRP  --   --   --   --  4.6*  --  3.1* 1.6* 0.9  PROCALCITON  --   --  0.16  --   --   --   --   --   --   TSH  --   --   --   --   --  0.299*  --  0.268*  --   BNP 27.2  --   --   --  19.2  --  18.3 16.5 13.5  MG  --   --   --   --   --   --  2.1 2.2 2.1  CALCIUM 8.9  --  8.9 9.1  --   --  9.1 8.8* 9.1   < > = values in this interval not displayed.     Total Time in preparing paper work, data evaluation and todays exam - 35 minutes  Signature  -     Lala Lund M.D on 05/24/2022 at 8:32 AM   -  To page go to [email or web address]

## 2022-05-25 NOTE — Telephone Encounter (Signed)
For pt to have an OV with HFU, we are looking about 2 weeks out for this. Dr. [name] please advise on this.

## 2022-05-25 NOTE — Telephone Encounter (Signed)
Called and spoke with pt and got her scheduled for OV and PFT. Also stated to pt that we would do an EKG at OV as well and she verbalized understanding. Nothing further needed.

## 2022-05-25 NOTE — Progress Notes (Unsigned)
Office Visit    Patient Name: Caroline Greer Date of Encounter: 05/27/2022  PCP:  [name], [facility]  Cardiologist:  Caroline Him, MD  Advanced Practice Provider:  No care team member to display Electrophysiologist:  None   HPI    Caroline Greer is a 67 y.o. female with a history of COPD, chronic hypoxic respiratory failure, right breast cancer status postlumpectomy and radiation, depression/anxiety, hypertension, hyperlipidemia, tobacco use, and nonobstructive CAD managed medically) last cath 09/2019) presenting with right-sided chest pain and 2 small ED 02/05/2022 and was admitted, and then discharged on 02/10/2022.  She was shortness of breath.  Found to be hypoxic with SPO2 in the 40s on EMS arrival.  Reportedly having an occasional dry cough but no sputum fever/chills.  She was placed on BiPAP and brought to the hospital for further evaluation.  During work-up she was also noted to be in new onset atrial fibrillation with RVR.    For her lung disease she was started on steroids and antibiotics with improvement.  She was started on Eliquis 5 mg twice daily for atrial fibrillation and echo was obtained.  Cardizem drip was transitioned to metoprolol.  Is recommended not to use amiodarone due to underlying lung disease.  She was seen in the atrial fibrillation clinic 03/16/2022 and at that time she was in normal sinus rhythm.  She had not had any further atrial fibrillation.  She remains on oxygen via nasal cannula, 4 L/min.  She had midline of the spine milligrams twice daily.  She denied symptoms of palpitations, chest pain, shortness of breath, orthopnea, PND, lower extremity edema, dizziness, presyncope, syncope, or neurologic sequelae.  She was last seen by me 04/19/22 and she presented for a hospital follow-up.  She tells me that since her radiation for breast cancer back in May she has had 1 problem after another.  She states that everything she does she  gets short of breath.  She tells me that her chest muscles are sore and over the past 5 days or so she has felt like her shortness of breath is getting worse.  She is on 4 to 6 L nasal cannula.  Oxygen saturation is 93%.  She for his legs since she was switched to the [facility] inhaler she has been in a permanent fog and its affecting her vision.  She also feels like it has not helped with her shortness of breath.  She tells me that she also feels fluid overloaded which is not helping her breathing.  She is also in atrial fibrillation today.  She presented to the ER 12/27 with worsening SOB. History of chronic hypoxic respiratory failure initially requiring BiPAP. Echo and CT of the chest ordered. SOB was determined to be due to underlying CHF and COPD.  Today, she was recently in the ED for SOB diagnosed with COPD exacerbation and CHF.  She underwent diuresis with IV Lasix and was prescribed azithromycin and prednisone at discharge.  She has been much less short of breath and actually walked into the office today (last time she needed wheelchair).  She remains on her home dose of oxygen.  She also found out that she needs cataract surgery.  This was why she felt she was in a fog.  She is taking Lasix as needed and appears euvolemic on exam today.  Blood pressure well-controlled.  Reports no chest pain, pressure, or tightness. No edema, orthopnea, PND. Reports no palpitations.  Past Medical History    Past Medical History:  Diagnosis Date   Acute respiratory failure with hypoxia ([place]) 09/30/2018   Breast cancer ([place])    Constipation    COPD exacerbation ([place]) 01/05/2019   Depression with anxiety 09/30/2018   Dyspnea    Essential hypertension 09/30/2018   History of radiation therapy    Right breast- 09/01/21-10/16/21- Dr. Gery Pray   Hyperlipidemia 02/02/2022   Hypertension    Tobacco dependence due to cigarettes 09/30/2018   Past Surgical History:  Procedure Laterality Date   BREAST  LUMPECTOMY WITH RADIOACTIVE SEED AND SENTINEL LYMPH NODE BIOPSY Right 07/23/2021   Procedure: RIGHT BREAST LUMPECTOMY WITH RADIOACTIVE SEED AND SENTINEL LYMPH NODE BIOPSY;  Surgeon: Jovita Kussmaul, MD;  Location: [facility];  Service: General;  Laterality: Right;   HAND SURGERY Right 1997   Hand Fracture (right thumb & hand)   LAPAROTOMY  1974   RIGHT/LEFT HEART CATH AND CORONARY ANGIOGRAPHY N/A 10/02/2019   Procedure: RIGHT/LEFT HEART CATH AND CORONARY ANGIOGRAPHY;  Surgeon: Sherren Mocha, MD;  Location: [facility];  Service: Cardiovascular;  Laterality: N/A;   THORACENTESIS N/A 01/12/2022   Procedure: THORACENTESIS;  Surgeon: Juanito Doom, MD;  Location: [facility];  Service: Cardiopulmonary;  Laterality: N/A;    Allergies  Allergies  Allergen Reactions   Crab [Shellfish Allergy] Anaphylaxis and Swelling    Dungeness crab   Erythromycin Other (See Comments)    Convulsion, Tolerated Azithro 01/2022   Hydrocodone Nausea And Vomiting    Severe vomiting      EKGs/Labs/Other Studies Reviewed:   The following studies were reviewed today:  Echo 02/02/22  IMPRESSIONS     1. Left ventricular ejection fraction, by estimation, is 60 to 65%. The  left ventricle has normal function. The left ventricle has no regional  wall motion abnormalities. Left ventricular diastolic function could not  be evaluated.   2. Right ventricular systolic function is normal. The right ventricular  size is mildly enlarged.   3. The mitral valve is grossly normal. Trivial mitral valve  regurgitation. No evidence of mitral stenosis.   4. The aortic valve was not well visualized. Aortic valve regurgitation  is not visualized. No aortic stenosis is present.   Comparison(s): Changes from prior study are noted. Afib is now present. EF  unchanged.   FINDINGS   Left Ventricle: Left ventricular ejection fraction, by estimation, is 60  to 65%. The left ventricle has normal function. The left ventricle  has no  regional wall motion abnormalities. The left ventricular internal cavity  size was normal in size. There is   no left ventricular hypertrophy. Left ventricular diastolic function  could not be evaluated. Left ventricular diastolic function could not be  evaluated due to atrial fibrillation.   Right Ventricle: The right ventricular size is mildly enlarged. No  increase in right ventricular wall thickness. Right ventricular systolic  function is normal.   Mitral Valve: The mitral valve is grossly normal. Trivial mitral valve  regurgitation. No evidence of mitral valve stenosis.   Tricuspid Valve: The tricuspid valve is grossly normal. Tricuspid valve  regurgitation is trivial. No evidence of tricuspid stenosis.   Aortic Valve: The aortic valve was not well visualized. Aortic valve  regurgitation is not visualized. No aortic stenosis is present. Aortic  valve mean gradient measures 4.0 mmHg. Aortic valve peak gradient measures  6.6 mmHg.    EKG:  EKG is not ordered today.    Recent Labs: 05/20/2022: ALT 15  05/23/2022: TSH 0.268 05/24/2022: B Natriuretic Peptide 13.5; BUN 12; Creatinine, Ser 0.62; Hemoglobin 12.4; Magnesium 2.1; Platelets 220; Potassium 4.0; Sodium 140  Recent Lipid Panel    Component Value Date/Time   CHOL 141 05/13/2020 1436   TRIG 92 05/13/2020 1436   HDL 84 05/13/2020 1436   CHOLHDL 1.7 05/13/2020 1436   LDLCALC 40 05/13/2020 1436    Risk Assessment/Calculations:   CHA2DS2-VASc Score = 4   This indicates a 4.8% annual risk of stroke. The patient's score is based upon: CHF History: 0 HTN History: 1 Diabetes History: 0 Stroke History: 0 Vascular Disease History: 1 Age Score: 1 Gender Score: 1     Home Medications   Current Meds  Medication Sig   acetaminophen (TYLENOL) 500 MG tablet Take 1,000 mg by mouth daily as needed (pain).   albuterol (PROVENTIL) (2.5 MG/3ML) 0.083% nebulizer solution Take 3 mLs (2.5 mg total) by nebulization every  6 (six) hours as needed for wheezing or shortness of breath.   ALPRAZolam (XANAX) 0.5 MG tablet Take 0.5 mg by mouth at bedtime as needed for sleep.   amLODipine (NORVASC) 5 MG tablet Take 1 tablet (5 mg total) by mouth daily.   apixaban (ELIQUIS) 5 MG TABS tablet Take 1 tablet (5 mg total) by mouth 2 (two) times daily.   azithromycin (ZITHROMAX) 250 MG tablet Take 1 tablet (250 mg total) by mouth every Monday, Wednesday, and Friday. (Patient taking differently: Take 250 mg by mouth every Monday, Wednesday, and Friday. Per patient taking 1/2 tablet on Monday, Wednesday, and Friday.)   budesonide-formoterol (SYMBICORT) 160-4.5 MCG/ACT inhaler Inhale 2 puffs into the lungs 2 (two) times daily.   Cholecalciferol (VITAMIN D3) 25 MCG (1000 UT) CAPS Take 1,000 Units by mouth daily.   exemestane (AROMASIN) 25 MG tablet Take 0.5 tablets (12.5 mg total) by mouth daily after breakfast.   fluticasone (FLONASE) 50 MCG/ACT nasal spray Place 2 sprays into both nostrils 2 (two) times daily.   furosemide (LASIX) 40 MG tablet Take 0.5 tablets (20 mg total) by mouth daily. Take 1 tablet daily x 2 days, then as needed. (Patient taking differently: Take 40 mg by mouth as needed. Take 1 tablet daily x 2 days, then as needed.)   guaiFENesin (MUCINEX) 600 MG 12 hr tablet Take 600 mg by mouth 2 (two) times daily as needed for to loosen phlegm.   MAGNESIUM-OXIDE 400 (240 Mg) MG tablet Take 1 tablet by mouth daily.   metoprolol succinate (TOPROL-XL) 50 MG 24 hr tablet Take 1 tablet (50 mg total) by mouth daily. Take with or immediately following a meal.   pantoprazole (PROTONIX) 40 MG tablet Take 1 tablet (40 mg total) by mouth daily at 6 (six) AM.   predniSONE (DELTASONE) 5 MG tablet Label  & dispense according to the schedule below. take 8 Pills PO for 3 days, 6 Pills PO for 3 days, 4 Pills PO for 3 days, 2 Pills PO for 3 days, 1 Pills PO for 3 days, 1/2 Pill  PO for 3 days then STOP. Total 65 pills.   PROAIR HFA 108 (90  Base) MCG/ACT inhaler Inhale 2 puffs into the lungs every 6 (six) hours as needed for wheezing or shortness of breath. 90 day supply   vitamin B-12 (CYANOCOBALAMIN) 500 MCG tablet Take 500 mcg by mouth daily.   Vitamin E Skin OIL Apply 1 Application topically daily.     Review of Systems      All other systems reviewed and are  otherwise negative except as noted above.  Physical Exam    VS:  BP 124/80   Pulse 84   Ht '5\' 10"'$  (1.778 m)   Wt 166 lb 12.8 oz (75.7 kg)   SpO2 91%   BMI 23.93 kg/m  , BMI Body mass index is 23.93 kg/m.  Wt Readings from Last 3 Encounters:  05/27/22 166 lb 12.8 oz (75.7 kg)  05/21/22 164 lb 3.9 oz (74.5 kg)  04/29/22 164 lb 3.2 oz (74.5 kg)     GEN: Well nourished, well developed, in no acute distress. HEENT: normal. Neck: Supple, no JVD, carotid bruits, or masses. Cardiac: irregularly irregular, no murmurs, rubs, or gallops. No clubbing, cyanosis,  1+ non pitting bilateral edema.  Radials/PT 2+ and equal bilaterally.  Respiratory:  Respirations regular and unlabored, clear to auscultation bilaterally. GI: Soft, nontender, nondistended. MS: No deformity or atrophy. Skin: Warm and dry, no rash. Neuro:  Strength and sensation are intact. Psych: Normal affect.  Assessment & Plan    New onset atrial fibrillation in the setting of COPD/hypoxic respiratory failure -increase metoprolol to '50mg'$  daily  -rate controlled Afib today on exam -continue Eliquis -keep track of BP  closely and let us know if less than 003 systolic  -follow-up with the Afib clinic at the end of the month  CHF -likely a component of diastolic dysfunction -she is on as needed lasix '40mg'$ , continue -euvolemic on exam and having much less SOB since started on Azithromycin and prednisone.   Chronic respiratory failure -she is on 4-6L of oxygen as needed -PRN albuterol and inhaled Breztri daily (changed to Symbicort but will contact Dr. [name] and change back to [facility] since she  feels it works better for her) -continue PRN lasix   CHA2DS2-VASc score of at least 3 -continue Eliquis BID  Hypertension -better controlled today, 124/80 -continue current medications  Hyperlipidemia -per primary care  Nonobstructive CAD -no chest pains -continue current medications         Disposition: Follow up 3 months with Caroline Him, MD or APP.  Signed, Elgie Collard, PA-C 05/27/2022, 4:27 PM [facility]

## 2022-05-26 ENCOUNTER — Ambulatory Visit: Payer: Medicare Other | Admitting: Emergency Medicine

## 2022-05-27 ENCOUNTER — Ambulatory Visit: Payer: Medicare Other | Attending: Physician Assistant | Admitting: Physician Assistant

## 2022-05-27 ENCOUNTER — Encounter: Payer: Self-pay | Admitting: Physician Assistant

## 2022-05-27 VITALS — BP 124/80 | HR 84 | Ht 70.0 in | Wt 166.8 lb

## 2022-05-27 DIAGNOSIS — J9611 Chronic respiratory failure with hypoxia: Secondary | ICD-10-CM

## 2022-05-27 DIAGNOSIS — J449 Chronic obstructive pulmonary disease, unspecified: Secondary | ICD-10-CM | POA: Diagnosis not present

## 2022-05-27 DIAGNOSIS — I4811 Longstanding persistent atrial fibrillation: Secondary | ICD-10-CM

## 2022-05-27 DIAGNOSIS — I10 Essential (primary) hypertension: Secondary | ICD-10-CM

## 2022-05-27 DIAGNOSIS — D6869 Other thrombophilia: Secondary | ICD-10-CM

## 2022-05-27 DIAGNOSIS — E785 Hyperlipidemia, unspecified: Secondary | ICD-10-CM | POA: Diagnosis not present

## 2022-05-27 DIAGNOSIS — I2510 Atherosclerotic heart disease of native coronary artery without angina pectoris: Secondary | ICD-10-CM

## 2022-05-27 DIAGNOSIS — I272 Pulmonary hypertension, unspecified: Secondary | ICD-10-CM | POA: Diagnosis not present

## 2022-05-27 DIAGNOSIS — Z9981 Dependence on supplemental oxygen: Secondary | ICD-10-CM

## 2022-05-27 DIAGNOSIS — I4891 Unspecified atrial fibrillation: Secondary | ICD-10-CM

## 2022-05-27 NOTE — Patient Instructions (Signed)
Medication Instructions:  Your physician recommends that you continue on your current medications as directed. Please refer to the Current Medication list given to you today.  *If you need a refill on your cardiac medications before your next appointment, please call your pharmacy*   Lab Work: None ordered If you have labs (blood work) drawn today and your tests are completely normal, you will receive your results only by: [place] (if you have MyChart) OR A paper copy in the mail If you have any lab test that is abnormal or we need to change your treatment, we will call you to review the results.   Follow-Up: At [facility], you and your health needs are our priority.  As part of our continuing mission to provide you with exceptional heart care, we have created designated Provider Care Teams.  These Care Teams include your primary Cardiologist (physician) and Advanced Practice Providers (APPs -  Physician Assistants and Nurse Practitioners) who all work together to provide you with the care you need, when you need it.  Your next appointment:   3 month(s)  The format for your next appointment:   In Person  Provider:   [name], MD  or Nicholes Rough, PA-C        Important Information About Sugar

## 2022-05-28 LAB — HYPERSENSITIVITY PNEUMONITIS
A. Pullulans Abs: NEGATIVE
A.Fumigatus #1 Abs: NEGATIVE
Micropolyspora faeni, IgG: NEGATIVE
Pigeon Serum Abs: NEGATIVE
Thermoact. Saccharii: NEGATIVE
Thermoactinomyces vulgaris, IgG: NEGATIVE

## 2022-06-01 ENCOUNTER — Ambulatory Visit (HOSPITAL_COMMUNITY): Payer: 59

## 2022-06-07 ENCOUNTER — Ambulatory Visit (HOSPITAL_COMMUNITY)
Admission: RE | Admit: 2022-06-07 | Discharge: 2022-06-07 | Disposition: A | Discharge status: Home / Self Care | Payer: 59 | Source: Ambulatory Visit | Attending: Pulmonary Disease | Admitting: Pulmonary Disease

## 2022-06-07 DIAGNOSIS — R918 Other nonspecific abnormal finding of lung field: Secondary | ICD-10-CM

## 2022-06-14 ENCOUNTER — Inpatient Hospital Stay: Payer: Medicare Other | Admitting: Adult Health

## 2022-06-15 ENCOUNTER — Telehealth: Payer: Self-pay | Admitting: Pulmonary Disease

## 2022-06-15 DIAGNOSIS — R911 Solitary pulmonary nodule: Secondary | ICD-10-CM

## 2022-06-15 NOTE — Progress Notes (Signed)
Tay please see phone note  Thanks,  Adrian, DO [facility] 06/15/2022 4:03 PM

## 2022-06-15 NOTE — Telephone Encounter (Signed)
-----  Message from Melvenia Needles, NP sent at 06/15/2022  3:38 PM EST ----- She missed her office visit  ----- Message ----- From: Garner Nash, DO Sent: 06/14/2022  12:31 PM EST To: Melvenia Needles, NP   FYI seeing you in follow up this week Thanks  BLI    ----- Message ----- From: Interface, Rad Results In Sent: 06/07/2022   8:11 PM EST To: Garner Nash, DO

## 2022-06-15 NOTE — Telephone Encounter (Signed)
PCCM:  Please let patient know I have reviewed her CT scan of the chest.  She will need repeat CT scan of the chest in 3 months to ensure appropriate nodule follow-up.  Please ensure follow-up appointment is scheduled with myself or APP after CT is complete.  [facility] 06/15/2022 4:02 PM

## 2022-06-15 NOTE — Telephone Encounter (Signed)
Spoke wit daughter today and she informed me that her mother died on 06/25/22. I let Dr [name] and Tammy Parrett both know. Nothing further needed.

## 2022-06-17 IMAGING — CT CT CHEST LUNG CANCER SCREENING LOW DOSE W/O CM
1 series · 10 of 10 positions shown, 13 images · non-contrast
Comparison: Chest CT 02/22/2019.  Cardiac CT 09/20/2019.

CLINICAL DATA: 64-year-old female current smoker with 33 pack-year
history of smoking. Lung cancer screening examination.

EXAM:
CT CHEST WITHOUT CONTRAST LOW-DOSE FOR LUNG CANCER SCREENING
TECHNIQUE: Multidetector CT imaging of the chest was performed following the
standard protocol without IV contrast.

[ct lung segmentation data · axial · 0.68mm/px · z∈[-354,-354]mm · 10 of 326 frames shown]
[frame 1/326  mediastinal]
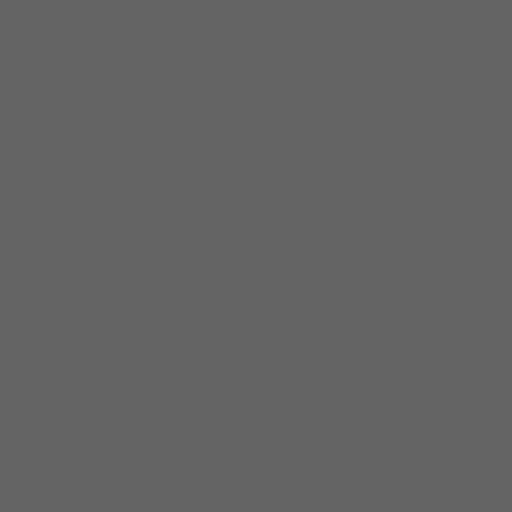
[frame 1/326  lung]
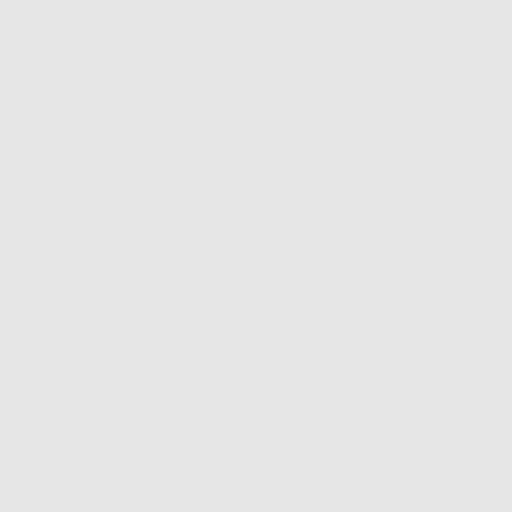
[frame 37/326  lung]
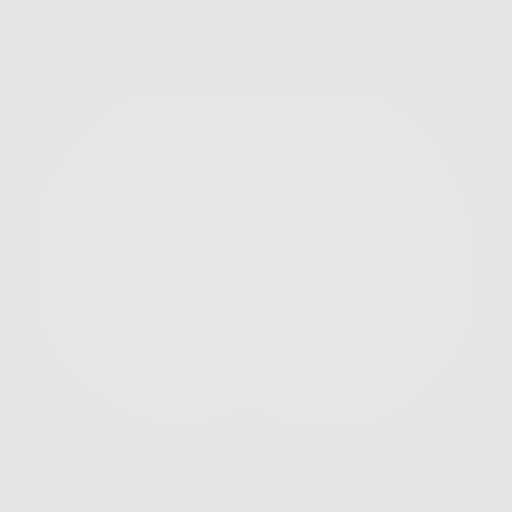
[frame 73/326  lung]
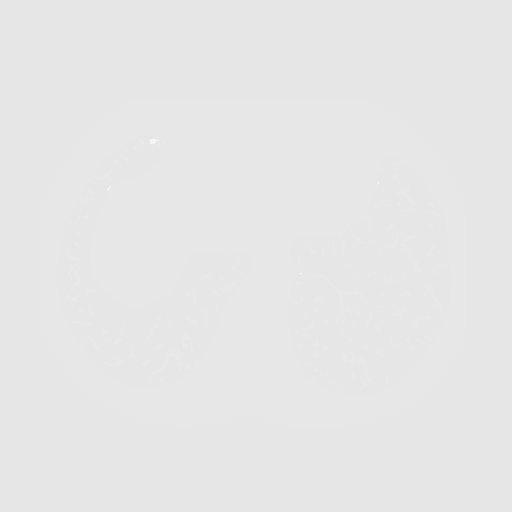
[frame 109/326  lung]
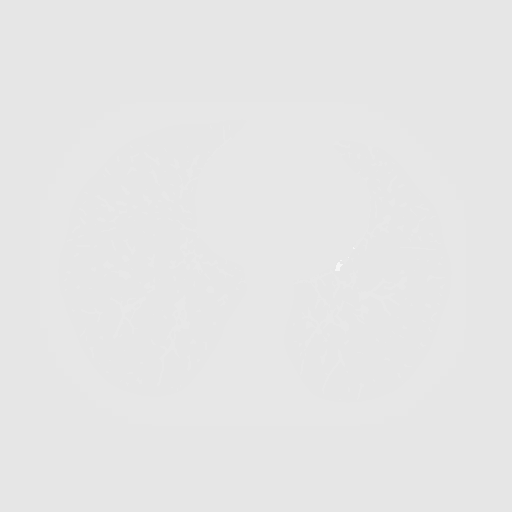
[frame 145/326  mediastinal]
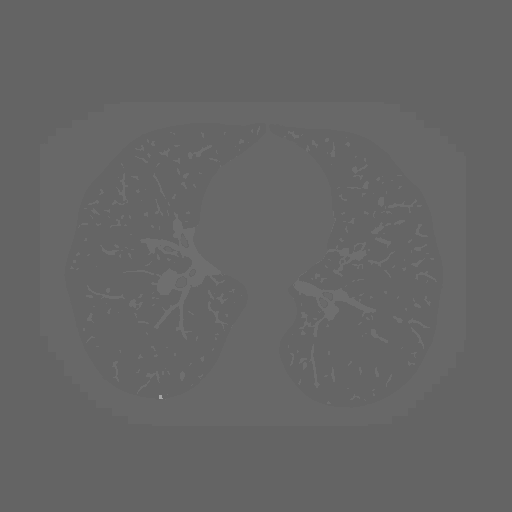
[frame 145/326  lung]
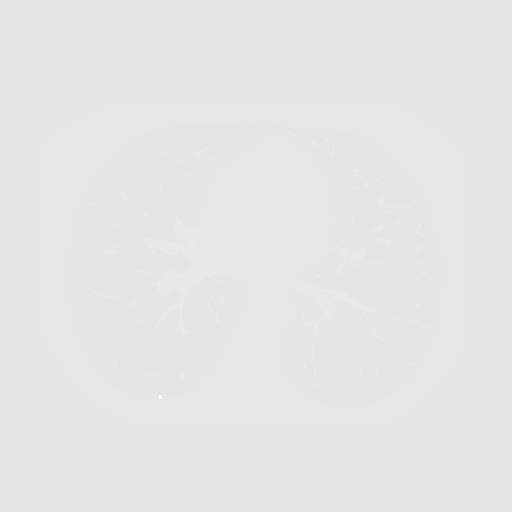
[frame 181/326  lung]
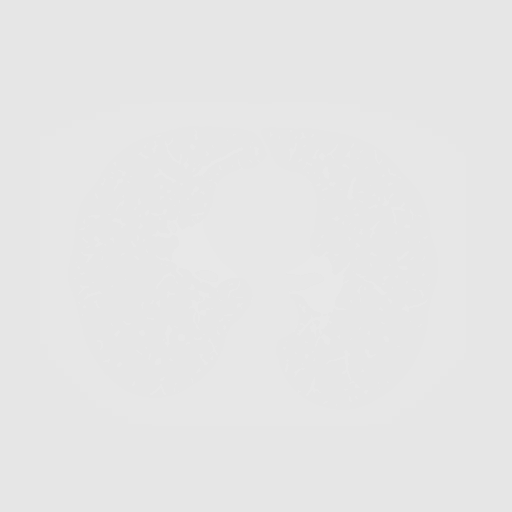
[frame 217/326  lung]
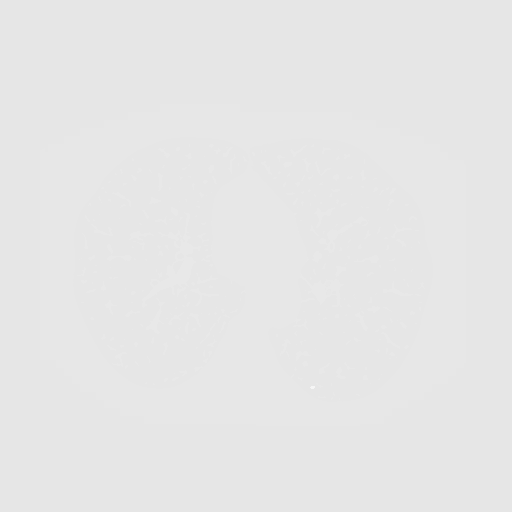
[frame 253/326  lung]
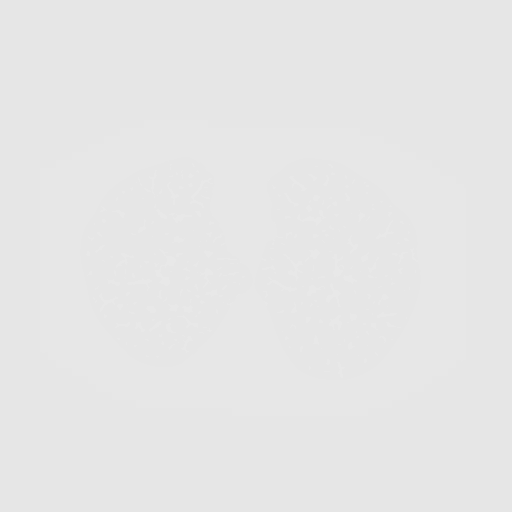
[frame 289/326  mediastinal]
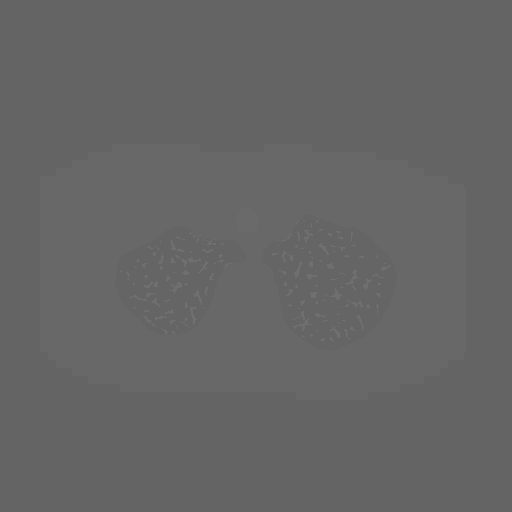
[frame 289/326  lung]
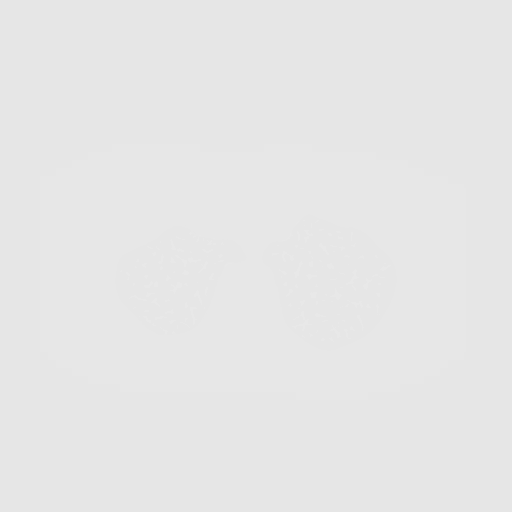
[frame 326/326  lung]
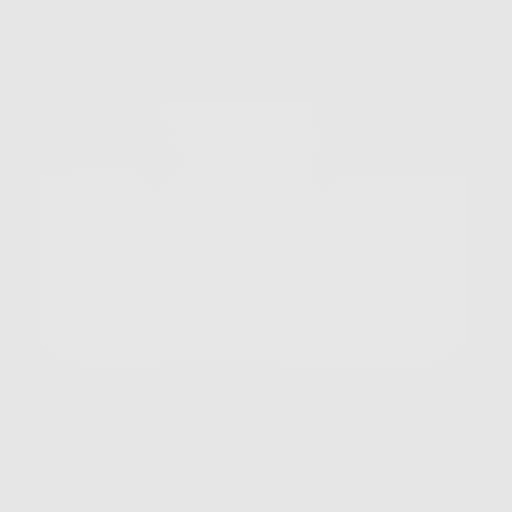

[10 of 10 positions shown; findings below may reference images not displayed]

FINDINGS: Cardiovascular: Heart size is normal. There is no significant
pericardial fluid, thickening or pericardial calcification. There is
aortic atherosclerosis, as well as atherosclerosis of the great
vessels of the mediastinum and the coronary arteries, including
calcified atherosclerotic plaque in the left main, left anterior
descending, left circumflex and right coronary arteries.
Calcifications of the aortic valve.

Mediastinum/Nodes: No pathologically enlarged mediastinal or hilar
lymph nodes. Please note that accurate exclusion of hilar adenopathy
is limited on noncontrast CT scans. Esophagus is unremarkable in
appearance. No axillary lymphadenopathy.

Lungs/Pleura: Multiple small pulmonary nodules are noted throughout
the lungs bilaterally, largest of which is in the posterior aspect
of the right lower lobe (axial image 180 of series 3), with a volume
derived mean diameter 4.4 mm. No other larger more suspicious
appearing pulmonary nodules or masses are noted. No acute
consolidative airspace disease. No pleural effusions. Mild diffuse
bronchial wall thickening with mild centrilobular and paraseptal
emphysema.

Upper Abdomen: Aortic atherosclerosis.

Musculoskeletal: There are no aggressive appearing lytic or blastic
lesions noted in the visualized portions of the skeleton.
IMPRESSION: 1. Lung-RADS 2S, benign appearance or behavior. Continue annual
screening with low-dose chest CT without contrast in 12 months.
2. The "S" modifier above refers to potentially clinically
significant non lung cancer related findings. Specifically, there is
aortic atherosclerosis, in addition to left main and 3 vessel
coronary artery disease. Please note that although the presence of
coronary artery calcium documents the presence of coronary artery
disease, the severity of this disease and any potential stenosis
cannot be assessed on this non-gated CT examination. Assessment for
potential risk factor modification, dietary therapy or pharmacologic
therapy may be warranted, if clinically indicated.
3. Mild diffuse bronchial wall thickening with mild centrilobular
and paraseptal emphysema; imaging findings suggestive of underlying
COPD.
4. There are calcifications of the aortic valve. Echocardiographic
correlation for evaluation of potential valvular dysfunction may be
warranted if clinically indicated.

Aortic Atherosclerosis (C2V8E-6O8.8) and Emphysema (C2V8E-P92.P).

## 2022-06-21 ENCOUNTER — Ambulatory Visit (HOSPITAL_COMMUNITY): Payer: Medicare Other | Admitting: Physician Assistant

## 2022-06-21 ENCOUNTER — Encounter (HOSPITAL_COMMUNITY): Payer: Self-pay

## 2022-06-24 DEATH — deceased

## 2022-06-28 NOTE — Telephone Encounter (Signed)
IUT

## 2022-07-01 ENCOUNTER — Encounter (HOSPITAL_COMMUNITY): Payer: Self-pay | Admitting: *Deleted

## 2022-07-21 NOTE — Progress Notes (Incomplete)
Patient Care Team: [name], MD as PCP - General (Internal Medicine) Sueanne Margarita, MD as PCP - Cardiology (Cardiology) Jovita Kussmaul, MD as Consulting Physician (General Surgery) Nicholas Lose, MD as Consulting Physician (Hematology and Oncology) Gery Pray, MD as Consulting Physician (Radiation Oncology) Garner Nash, DO as Consulting Physician (Pulmonary Disease)  DIAGNOSIS: No diagnosis found.  SUMMARY OF ONCOLOGIC HISTORY: Oncology History  Malignant neoplasm of upper-outer quadrant of right breast in female, estrogen receptor positive ([place])  05/20/2021 Initial Diagnosis   Palpable mass in the right breast upper outer quadrant: 1.1 cm along with distortion.  Mammogram ultrasound revealed 1.2 cm mass at 11 o'clock position, axilla negative, biopsy revealed grade 2 invasive lobular cancer ER 90%, PR 100%, HER2 negative, Ki-67 5%   06/03/2021 Cancer Staging   Staging form: Breast, AJCC 8th Edition - Clinical stage from 06/03/2021: Stage IA (cT1b, cN0, cM0, G2, ER+, PR+, HER2-) - Signed by Nicholas Lose, MD on 06/03/2021 Stage prefix: Initial diagnosis Histologic grading system: 3 grade system   07/23/2021 Surgery   Right lumpectomy: Grade 2 invasive lobular cancer, LCIS, margins negative, 1/3 lymph nodes positive, ER 90%, PR 100%, HER2 negative, Ki-67 5%   08/03/2021 Cancer Staging   Staging form: Breast, AJCC 8th Edition - Pathologic: Stage IA (pT1c, pN1, cM0, G2, ER+, PR+, HER2-) - Signed by Nicholas Lose, MD on 08/03/2021 Stage prefix: Initial diagnosis Histologic grading system: 3 grade system   09/01/2021 - 10/16/2021 Radiation Therapy   Site Technique Total Dose (Gy) Dose per Fx (Gy) Completed Fx Beam Energies  Breast, Right: Breast_R 3D 50.4/50.4 1.8 28/28 6X, 10X  Breast, Right: Breast_R_SCLV 3D 50.4/50.4 1.8 28/28 10X  Breast, Right: Breast_R_Bst specialPort 12/12 2 6/6 9E, 12E       CHIEF COMPLIANT: Follow-up on exemestane   INTERVAL HISTORY: Caroline Greer is a  67 y.o. female is here because of recent diagnosis of right breast cancer. She presents to the clinic for a follow-up.   ALLERGIES:  is allergic to crab [shellfish allergy], erythromycin, and hydrocodone.  MEDICATIONS:  Current Outpatient Medications  Medication Sig Dispense Refill   acetaminophen (TYLENOL) 500 MG tablet Take 1,000 mg by mouth daily as needed (pain).     albuterol (PROVENTIL) (2.5 MG/3ML) 0.083% nebulizer solution Take 3 mLs (2.5 mg total) by nebulization every 6 (six) hours as needed for wheezing or shortness of breath. 75 mL 12   ALPRAZolam (XANAX) 0.5 MG tablet Take 0.5 mg by mouth at bedtime as needed for sleep.     amLODipine (NORVASC) 5 MG tablet Take 1 tablet (5 mg total) by mouth daily. 30 tablet 1   apixaban (ELIQUIS) 5 MG TABS tablet Take 1 tablet (5 mg total) by mouth 2 (two) times daily. 60 tablet 5   azithromycin (ZITHROMAX) 250 MG tablet Take 1 tablet (250 mg total) by mouth every Monday, Wednesday, and Friday. (Patient taking differently: Take 250 mg by mouth every Monday, Wednesday, and Friday. Per patient taking 1/2 tablet on Monday, Wednesday, and Friday.) 21 tablet 0   budesonide-formoterol (SYMBICORT) 160-4.5 MCG/ACT inhaler Inhale 2 puffs into the lungs 2 (two) times daily. 30.6 g 3   Cholecalciferol (VITAMIN D3) 25 MCG (1000 UT) CAPS Take 1,000 Units by mouth daily.     exemestane (AROMASIN) 25 MG tablet Take 0.5 tablets (12.5 mg total) by mouth daily after breakfast. 60 tablet 3   fluticasone (FLONASE) 50 MCG/ACT nasal spray Place 2 sprays into both nostrils 2 (two) times daily.  furosemide (LASIX) 40 MG tablet Take 0.5 tablets (20 mg total) by mouth daily. Take 1 tablet daily x 2 days, then as needed. (Patient taking differently: Take 40 mg by mouth as needed. Take 1 tablet daily x 2 days, then as needed.) 30 tablet 0   guaiFENesin (MUCINEX) 600 MG 12 hr tablet Take 600 mg by mouth 2 (two) times daily as needed for to loosen phlegm.      MAGNESIUM-OXIDE 400 (240 Mg) MG tablet Take 1 tablet by mouth daily.     metoprolol succinate (TOPROL-XL) 50 MG 24 hr tablet Take 1 tablet (50 mg total) by mouth daily. Take with or immediately following a meal. 90 tablet 3   pantoprazole (PROTONIX) 40 MG tablet Take 1 tablet (40 mg total) by mouth daily at 6 (six) AM. 30 tablet 0   predniSONE (DELTASONE) 5 MG tablet Label  & dispense according to the schedule below. take 8 Pills PO for 3 days, 6 Pills PO for 3 days, 4 Pills PO for 3 days, 2 Pills PO for 3 days, 1 Pills PO for 3 days, 1/2 Pill  PO for 3 days then STOP. Total 65 pills. 65 tablet 0   PROAIR HFA 108 (90 Base) MCG/ACT inhaler Inhale 2 puffs into the lungs every 6 (six) hours as needed for wheezing or shortness of breath. 90 day supply 24 g 3   vitamin B-12 (CYANOCOBALAMIN) 500 MCG tablet Take 500 mcg by mouth daily.     Vitamin E Skin OIL Apply 1 Application topically daily.     No current facility-administered medications for this visit.    PHYSICAL EXAMINATION: ECOG PERFORMANCE STATUS: {CHL ONC ECOG PS:[id number]}  There were no vitals filed for this visit. There were no vitals filed for this visit.  BREAST:*** No palpable masses or nodules in either right or left breasts. No palpable axillary supraclavicular or infraclavicular adenopathy no breast tenderness or nipple discharge. (exam performed in the presence of a chaperone)  LABORATORY DATA:  I have reviewed the data as listed    Latest Ref Rng & Units 05/24/2022    3:48 AM 05/23/2022    3:02 AM 05/22/2022    2:23 AM  CMP  Glucose 70 - 99 mg/dL 95  97  93   BUN 8 - 23 mg/dL '12  13  14   '$ Creatinine 0.44 - 1.00 mg/dL 0.62  0.56  0.57   Sodium 135 - 145 mmol/L 140  141  144   Potassium 3.5 - 5.1 mmol/L 4.0  4.0  4.2   Chloride 98 - 111 mmol/L 90  92  96   CO2 22 - 32 mmol/L 40  40  39   Calcium 8.9 - 10.3 mg/dL 9.1  8.8  9.1     Lab Results  Component Value Date   WBC 10.1 05/24/2022   HGB 12.4 05/24/2022   HCT  40.0 05/24/2022   MCV 100.8 (H) 05/24/2022   PLT 220 05/24/2022   NEUTROABS 7.5 05/24/2022    ASSESSMENT & PLAN:  No problem-specific Assessment & Plan notes found for this encounter.    No orders of the defined types were placed in this encounter.  The patient has a good understanding of the overall plan. she agrees with it. she will call with any problems that may develop before the next visit here. Total time spent: 30 mins including face to face time and time spent for planning, charting and co-ordination of care   [place], CMA  07/21/22    I Tiaunna Buford, Nahomi Hegner am acting as a Education administrator for [employer]  ***

## 2022-07-22 ENCOUNTER — Inpatient Hospital Stay: Payer: Medicaid Other | Attending: Hematology and Oncology | Admitting: Hematology and Oncology

## 2022-07-22 NOTE — Assessment & Plan Note (Deleted)
07/23/2021:Right lumpectomy: Grade 2 invasive lobular cancer, LCIS, margins negative, 1/3 lymph nodes positive, ER 90%, PR 100%, HER2 negative, Ki-67 5%   Treatment plan: 1.  Molecular testing will not be done because of severe COPD and performance status issues 2.  Adjuvant radiation therapy 09/02/2021-10/16/2021 3.  Adjuvant antiestrogen therapy to started 10/22/2021 switched to anastrozole.  Could not tolerate that either   Exemestane toxicities:     Hospitalization: 12/09/2021-12/15/2021: COPD exacerbation and respiratory failure.  She tells me that she had a profound diuresis of 5 pounds of weight in the first day of hospitalization and since then she has been feeling much better.  She uses oxygen 24/7. Hospitalization 05/19/2022-05/24/2022: Respiratory failure from COPD and chronic diastolic CHF

## 2022-09-14 ENCOUNTER — Ambulatory Visit (HOSPITAL_COMMUNITY): Payer: Medicaid Other

## 2022-09-17 ENCOUNTER — Ambulatory Visit: Payer: Medicaid Other | Admitting: Acute Care

## 2022-09-17 ENCOUNTER — Ambulatory Visit: Payer: Medicaid Other | Admitting: Cardiology
